# Patient Record
Sex: Male | Born: 1945 | Race: White | Hispanic: No | Smoking: Former smoker
Health system: Southern US, Community
[De-identification: ages and names within clinical notes are randomized; demographics above are authoritative.]

## PROBLEM LIST (undated history)

## (undated) DIAGNOSIS — C73 Malignant neoplasm of thyroid gland: Secondary | ICD-10-CM

## (undated) DIAGNOSIS — C801 Malignant (primary) neoplasm, unspecified: Secondary | ICD-10-CM

## (undated) DIAGNOSIS — M109 Gout, unspecified: Secondary | ICD-10-CM

## (undated) DIAGNOSIS — I10 Essential (primary) hypertension: Secondary | ICD-10-CM

## (undated) DIAGNOSIS — E785 Hyperlipidemia, unspecified: Secondary | ICD-10-CM

## (undated) DIAGNOSIS — I219 Acute myocardial infarction, unspecified: Secondary | ICD-10-CM

## (undated) HISTORY — DX: Malignant (primary) neoplasm, unspecified: C80.1

## (undated) HISTORY — DX: Acute myocardial infarction, unspecified: I21.9

## (undated) HISTORY — PX: LUNG LOBECTOMY: SHX167

## (undated) HISTORY — PX: TOTAL THYROIDECTOMY: SHX2547

## (undated) HISTORY — DX: Gout, unspecified: M10.9

## (undated) HISTORY — DX: Essential (primary) hypertension: I10

## (undated) HISTORY — DX: Malignant neoplasm of thyroid gland: C73

## (undated) HISTORY — DX: Hyperlipidemia, unspecified: E78.5

---

## 2005-06-25 HISTORY — PX: CORONARY ARTERY BYPASS GRAFT: SHX141

## 2005-11-27 ENCOUNTER — Other Ambulatory Visit: Payer: Self-pay

## 2005-11-27 ENCOUNTER — Inpatient Hospital Stay: Payer: Self-pay | Admitting: Internal Medicine

## 2005-11-27 IMAGING — CT CT CHEST W/ CM
2 of 3 series · 14 of 30 positions shown, 18 images · non-contrast
Comparison: none

REASON FOR EXAM: Chest pain        rm 17
COMMENTS:

[Series 10: soft tissue · axial · 0.76mm/px · 1 of 95 slices shown]
[im 8/95  mediastinal]
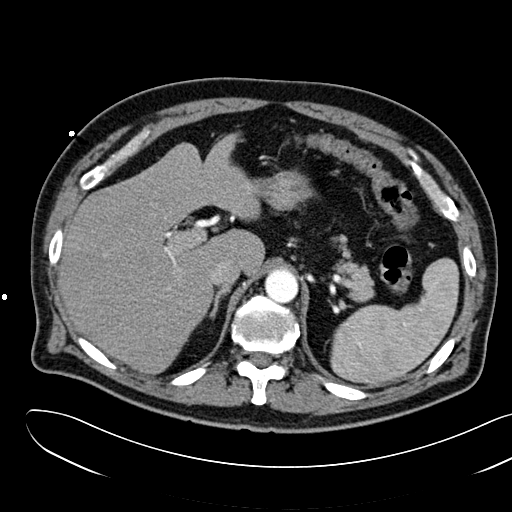

[Series 11: lung windows · axial · 0.76mm/px · z∈[+128,+359]mm · 13 of 93 slices shown, 17 images]
[im 8/93  mediastinal]
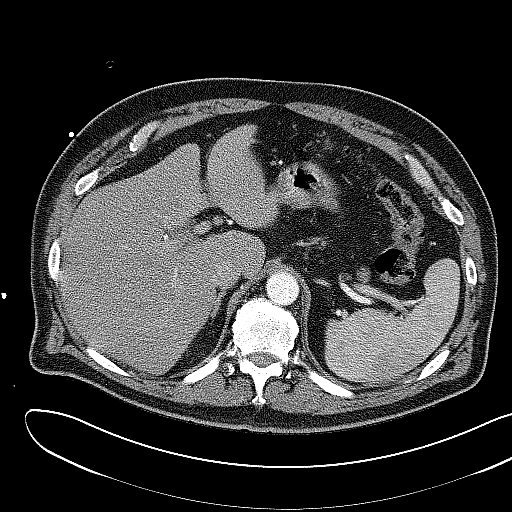
[im 8/93  lung]
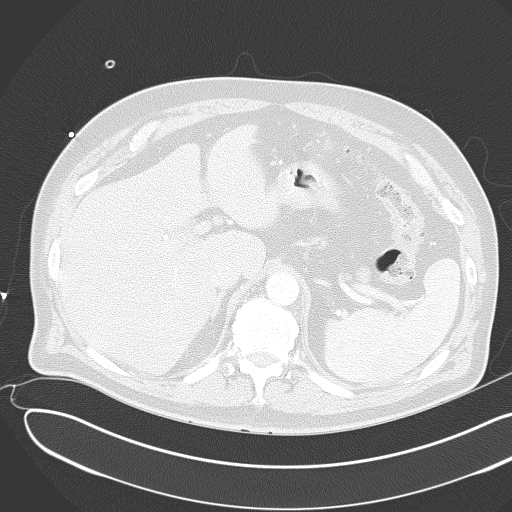
[im 15/93  lung]
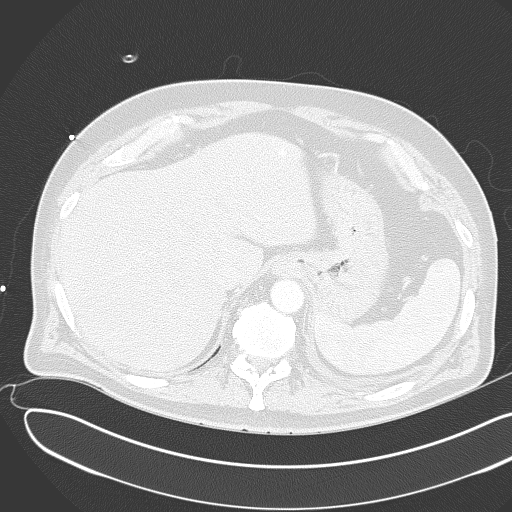
[im 22/93  lung]
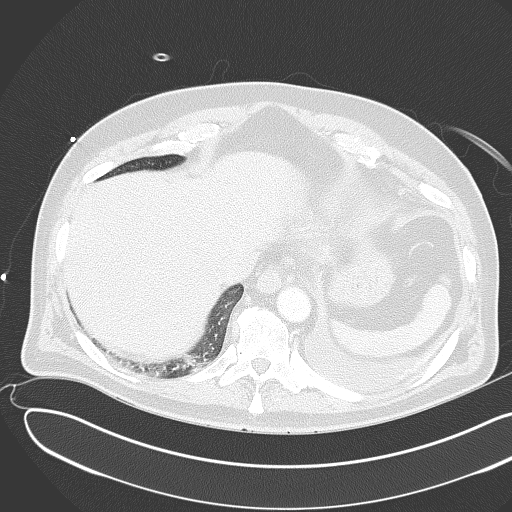
[im 29/93  lung]
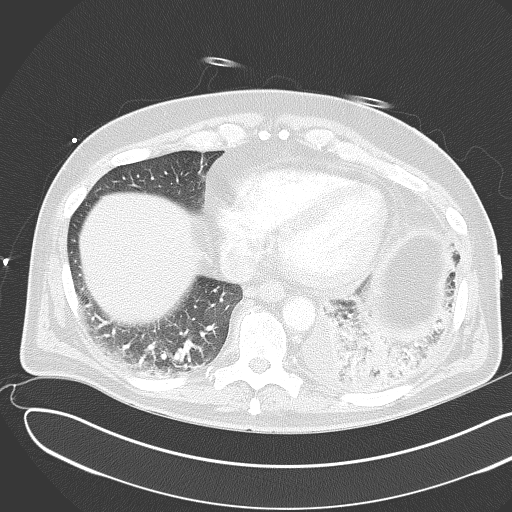
[im 36/93  mediastinal]
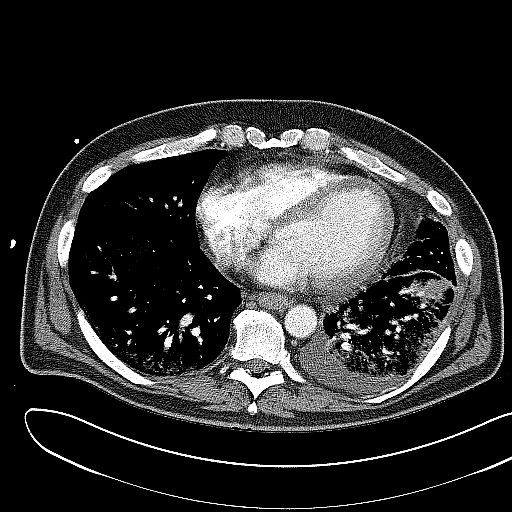
[im 36/93  lung]
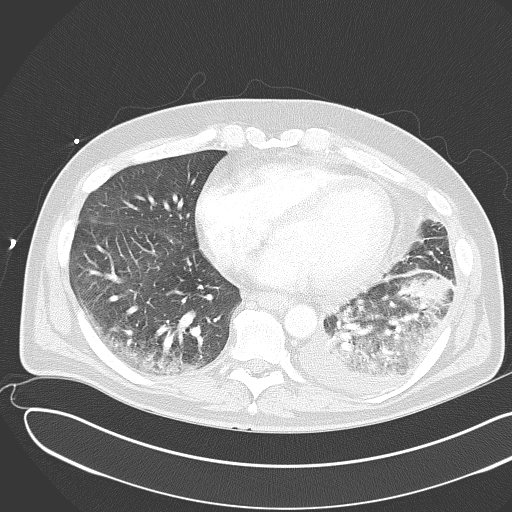
[im 43/93  lung]
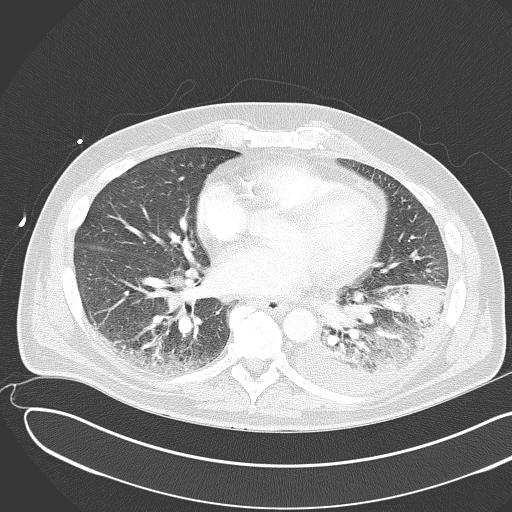
[im 47/93  lung]
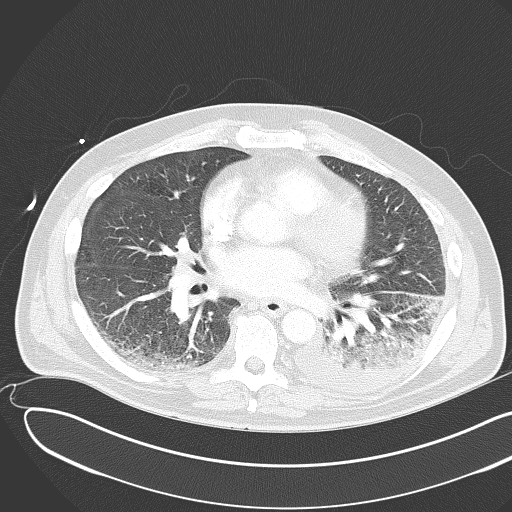
[im 50/93  lung]
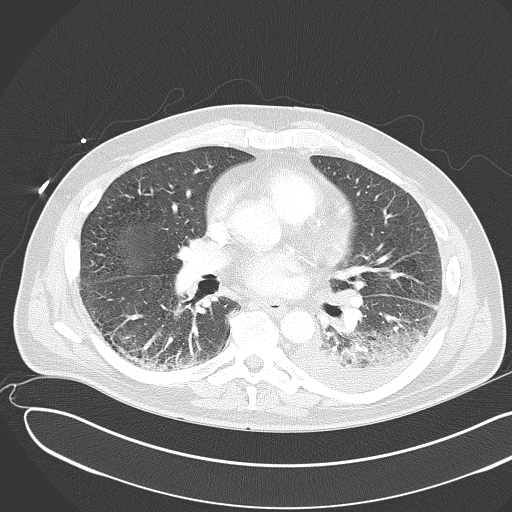
[im 57/93  mediastinal]
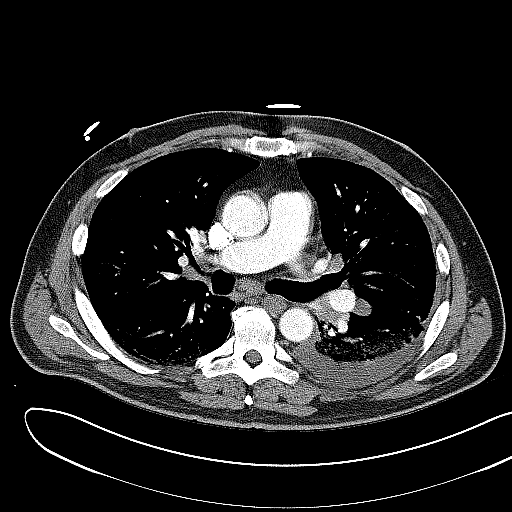
[im 57/93  lung]
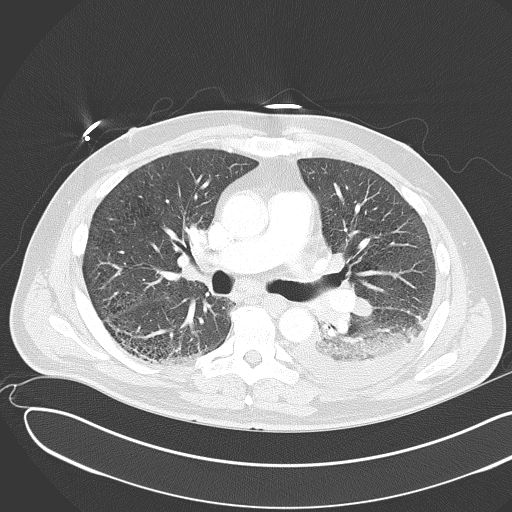
[im 64/93  lung]
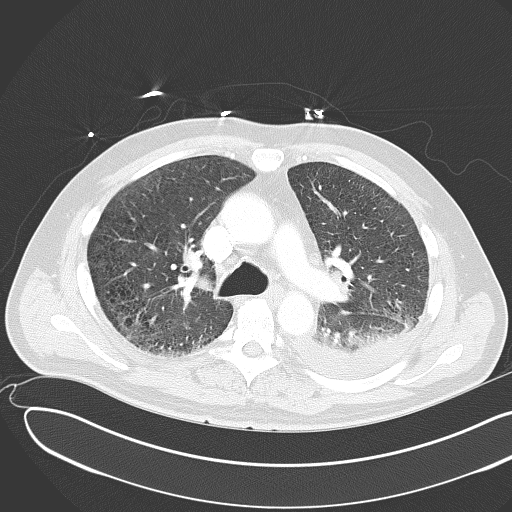
[im 71/93  lung]
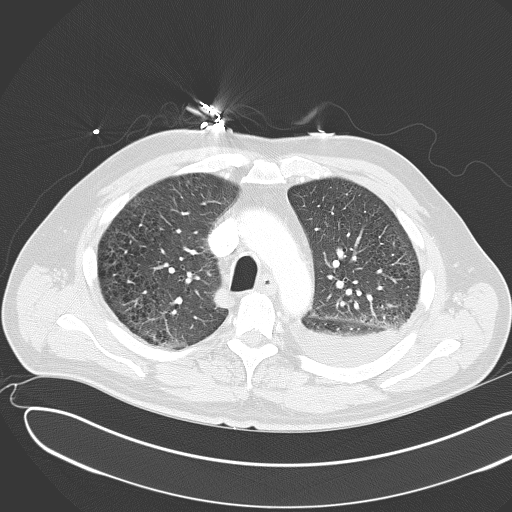
[im 78/93  lung]
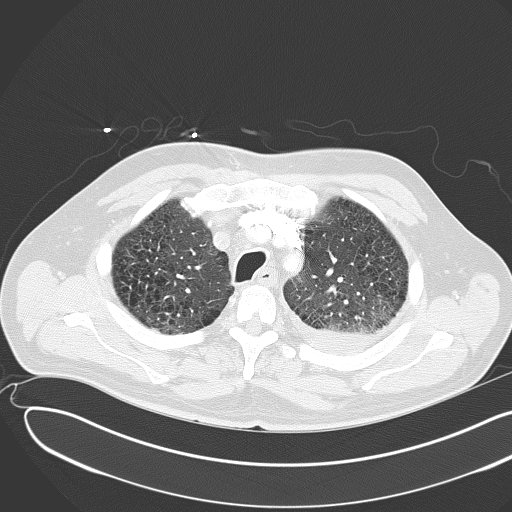
[im 85/93  mediastinal]
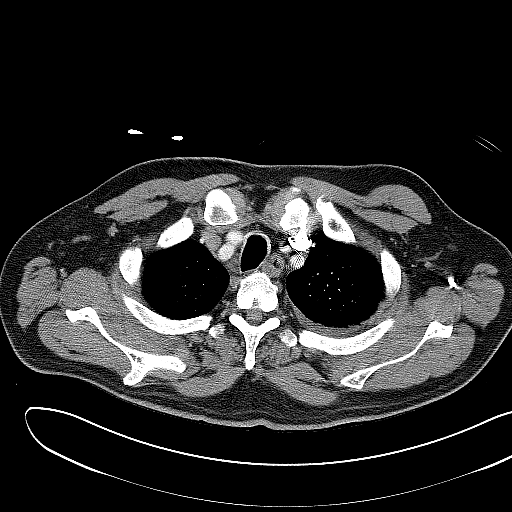
[im 85/93  lung]
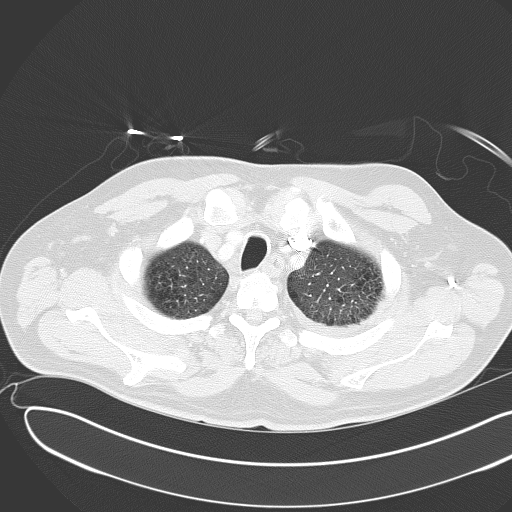

[14 of 30 positions shown; findings below may reference images not displayed]

PROCEDURE:     CT  - CT CHEST (FOR PE) W  - [DATE]  [DATE]

RESULT:     Spiral 3-mm sections were obtained from the thoracic inlet to
the lung bases status post intravenous administration of 100 milliliters of
[X1].  Evaluation of the mediastinum and hilar regions and structures
demonstrates bilateral hilar adenopathy as well as subcentimeter lymph nodes
in the peritracheal region and prevascular space.  Multichambered cardiac
enlargement is appreciated.  There does not appear to be filling defects
within the main lobar or segmental pulmonary arteries to suggest sequela of
pulmonary embolus.  Within the lateral basal segment of the RIGHT lower lobe
an area of soft tissue attenuation projects within the periphery of the lung
measuring approximately 3.92 x 3.13 cm.  Differential considerations are
atelectasis versus infiltrate versus more ominous etiologies such as
neoplastic disease.  Clinical correlation is recommended.  If the patient
does not demonstrates signs and symptoms of infection, neoplastic disease
would be a higher differential consideration and further evaluation with PET
imaging and oncological consultation is recommended. A small LEFT pleural
effusion is also appreciated as well as atelectasis and/or consolidation
within the LEFT lung base.  To a much lesser extent these findings are
appreciated within the RIGHT lung base.  The visualized upper abdominal
viscera demonstrate no gross abnormalities.
IMPRESSION: No evidence of pulmonary embolus.

Consolidative soft tissue density projecting within the LEFT lung base.
Differential considerations are atelectasis versus infiltrate versus more
ominous etiology such as neoplastic disease as described above. Clinical
correlation is recommended.

There appears to be bilateral hilar adenopathy.

Small effusion.

Atelectasis versus infiltrate within the lung bases LEFT greater than RIGHT.

Dr. MINODA of the emergency department was informed of these findings at the
time of the initial interpretation.

## 2005-11-27 IMAGING — CR DG CHEST 1V PORT
1 series · 1 of 1 positions shown · non-contrast
Comparison: none

REASON FOR EXAM: chest pain       rm 17
COMMENTS:  LMP: (Male)

PROCEDURE:     DXR - DXR PORTABLE CHEST SINGLE VIEW  - [DATE]  [DATE]
RESULT:     There is an infiltrate at the LEFT lung base.  Elsewhere, the
lungs are clear though the interstitial markings are minimally prominent.
The heart is not enlarged.

[view not recorded]
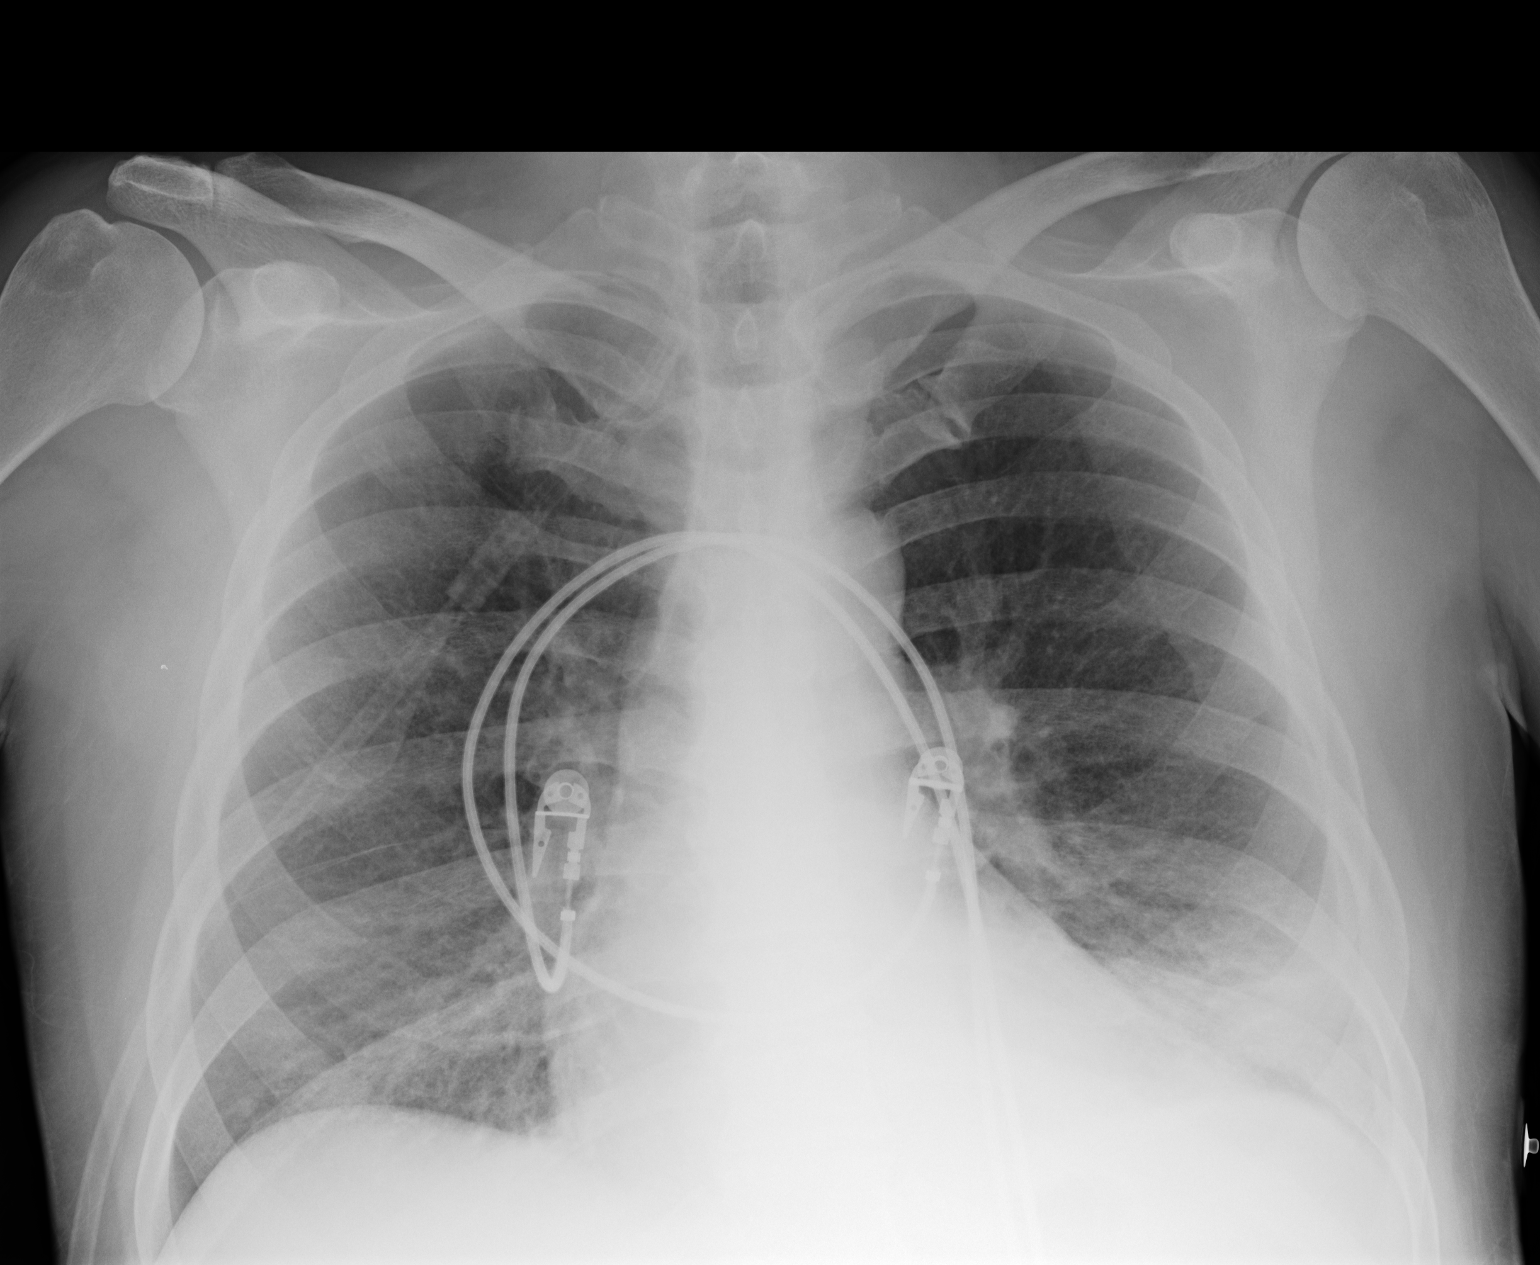

[1 of 1 positions shown; findings below may reference images not displayed]

IMPRESSION: 1)There are findings consistent with LEFT lower lobe pneumonia.  A follow-up
PA and lateral chest x-ray is recommended following therapy.

## 2005-11-28 ENCOUNTER — Other Ambulatory Visit: Payer: Self-pay

## 2005-11-29 IMAGING — CR DG CHEST 1V PORT
1 series · 1 of 1 positions shown · non-contrast
Comparison: none

REASON FOR EXAM: stat, increased shortness of breath, chest pain
COMMENTS:

[view not recorded]
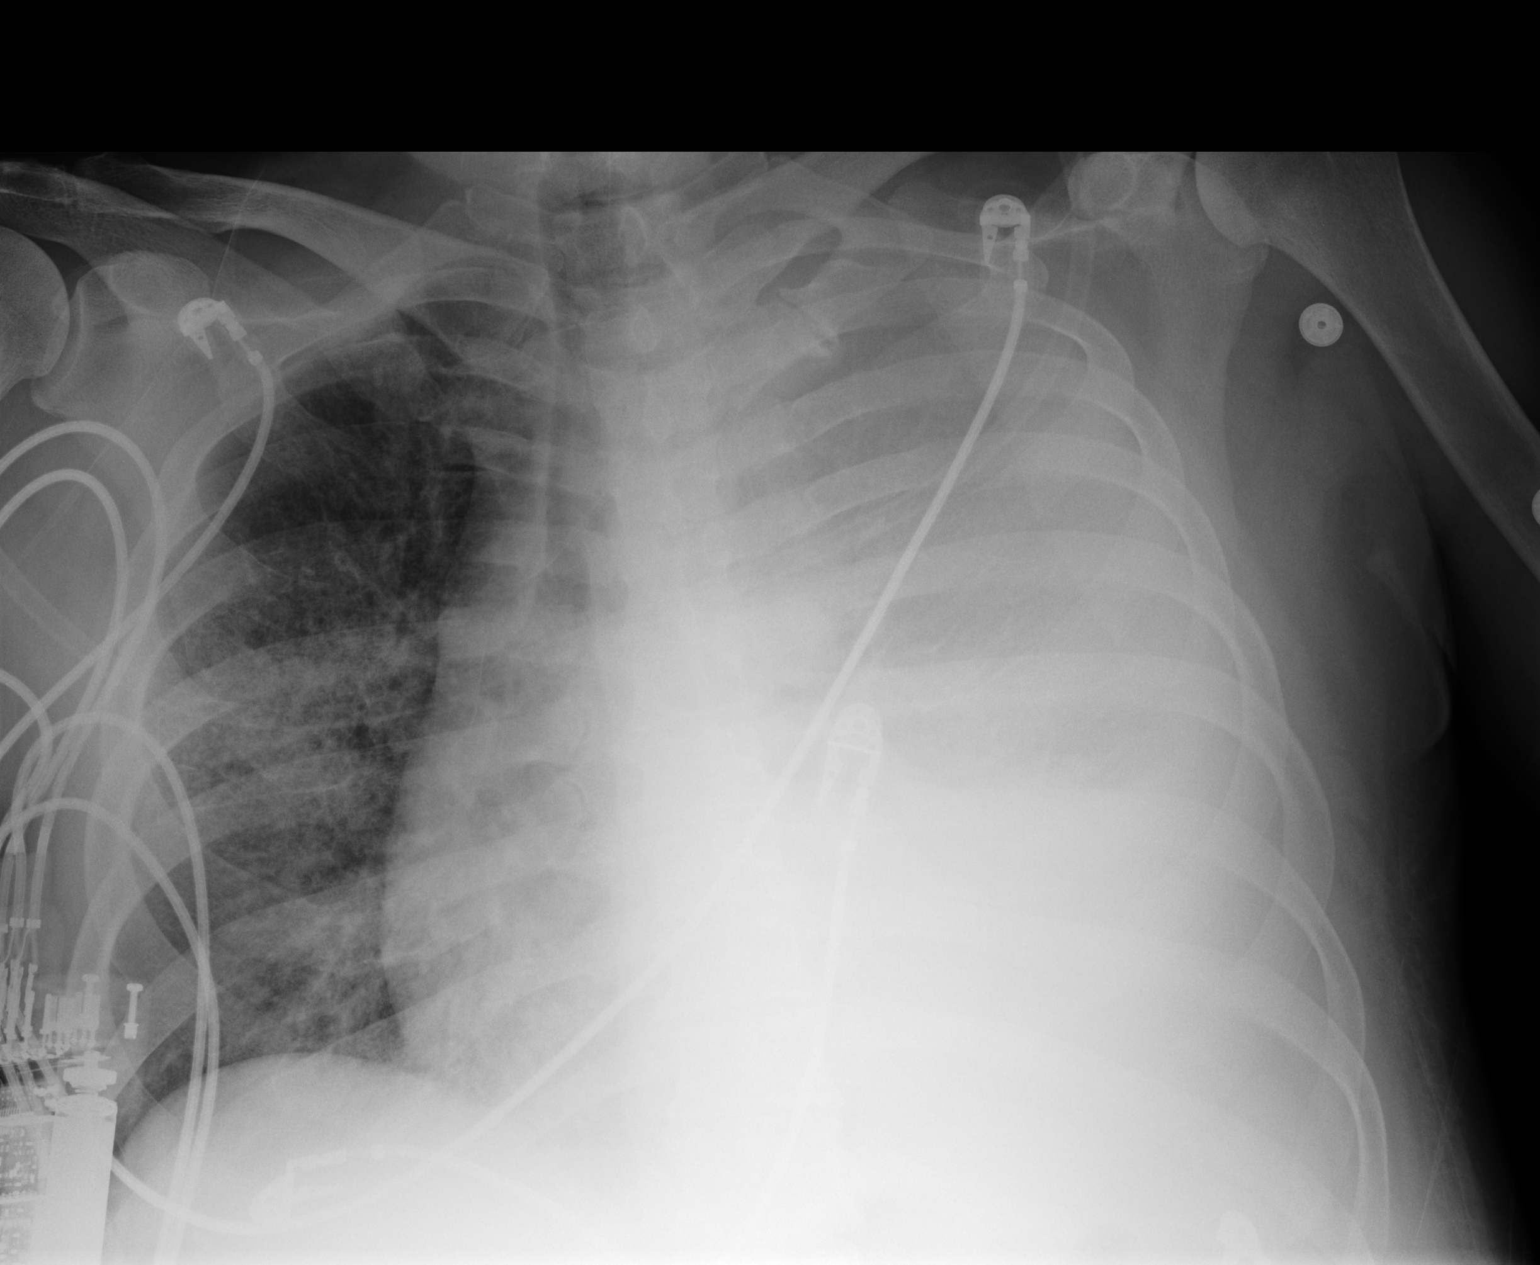

[1 of 1 positions shown; findings below may reference images not displayed]

PROCEDURE:     DXR - DXR PORTABLE CHEST SINGLE VIEW  - [DATE]  [DATE]

RESULT:          Comparison is made to a prior study of [DATE].

When compared to a previous study, there has been near complete
opacification of the LEFT hemithorax.  There is thickening of the
interstitial markings within the aerated RIGHT hemithorax with peribronchial
cuffing.  The cardiac silhouette is partially silhouetted by the LEFT
hemithorax.  The visualized bony skeleton demonstrates no evidence of
fracture or dislocation.
IMPRESSION: 1.     Findings consistent with pulmonary edema.
2.     Superimposed pulmonary edema with possibly a pleural effusion and/or
diffuse infiltrate, nonedematous, involving the LEFT hemithorax as described
above.

## 2005-11-29 IMAGING — CR DG RIBS 2V*L*
1 series · 2 of 2 positions shown · non-contrast
Comparison: none

REASON FOR EXAM: Left anterior rib pain, rule out fractere or lytic lesion
COMMENTS:

PROCEDURE:     DXR - DXR RIBS LEFT UNILATERAL  - [DATE]  [DATE]
RESULT:     No evidence of displaced rib fracture or pneumothorax. Diffuse
LEFT pulmonary infiltrate is present.

[Series 1: view not recorded · 0.17mm/px · 2 of 2 slices shown]
[im 1/2]
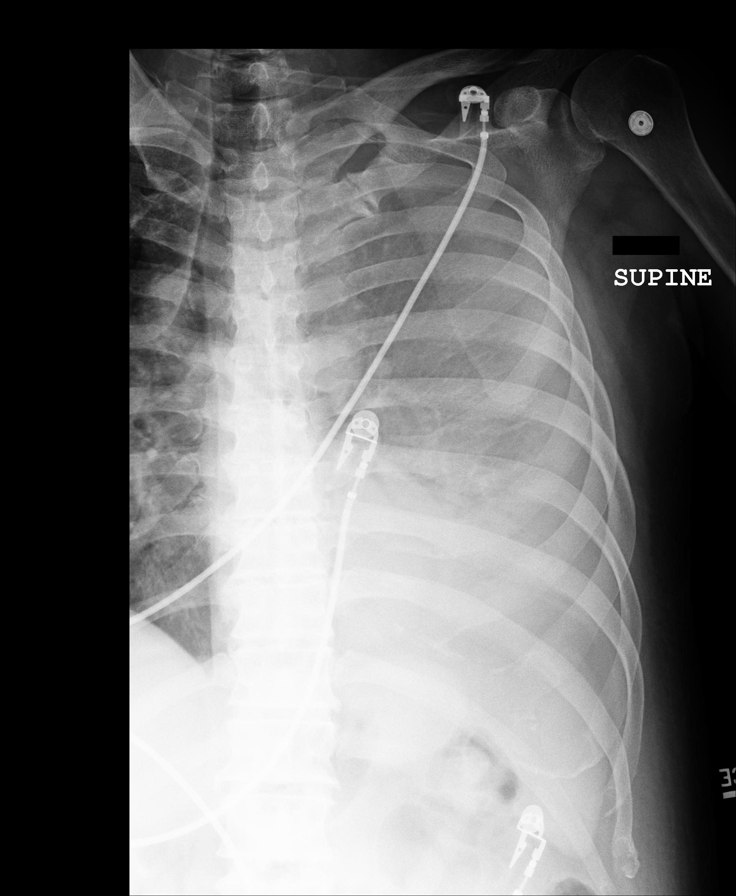
[im 2/2]
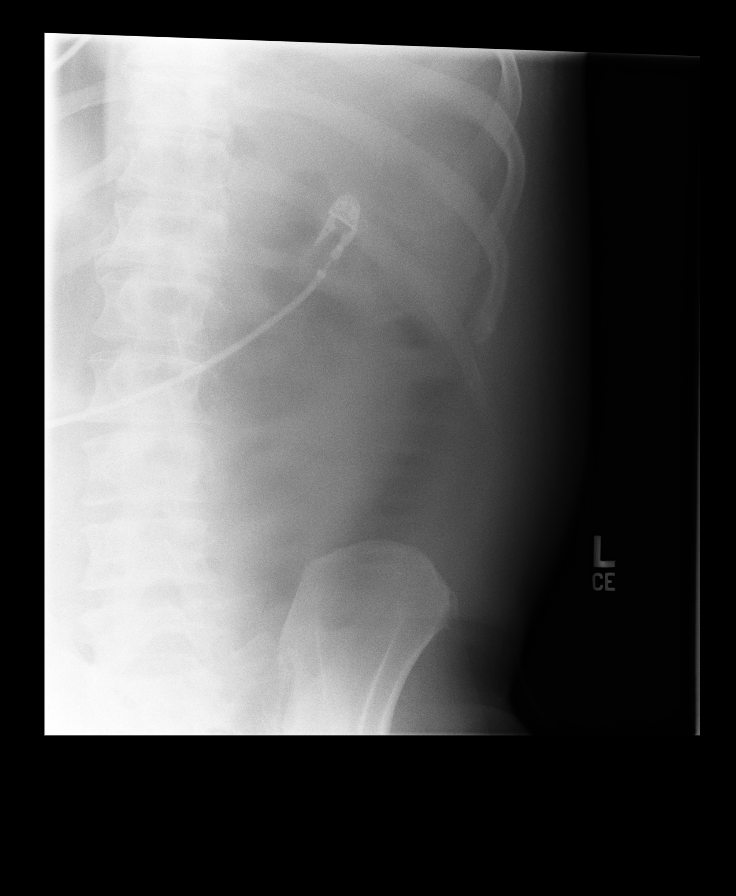

[2 of 2 positions shown; findings below may reference images not displayed]

IMPRESSION: 1.     No focal or acute abnormality is identified.
2.     Diffuse LEFT pulmonary infiltrate.

## 2005-11-30 IMAGING — CR DG CHEST 1V PORT
1 series · 1 of 1 positions shown · non-contrast
Comparison: none

REASON FOR EXAM: PNEUMONIA
COMMENTS:

[view not recorded]
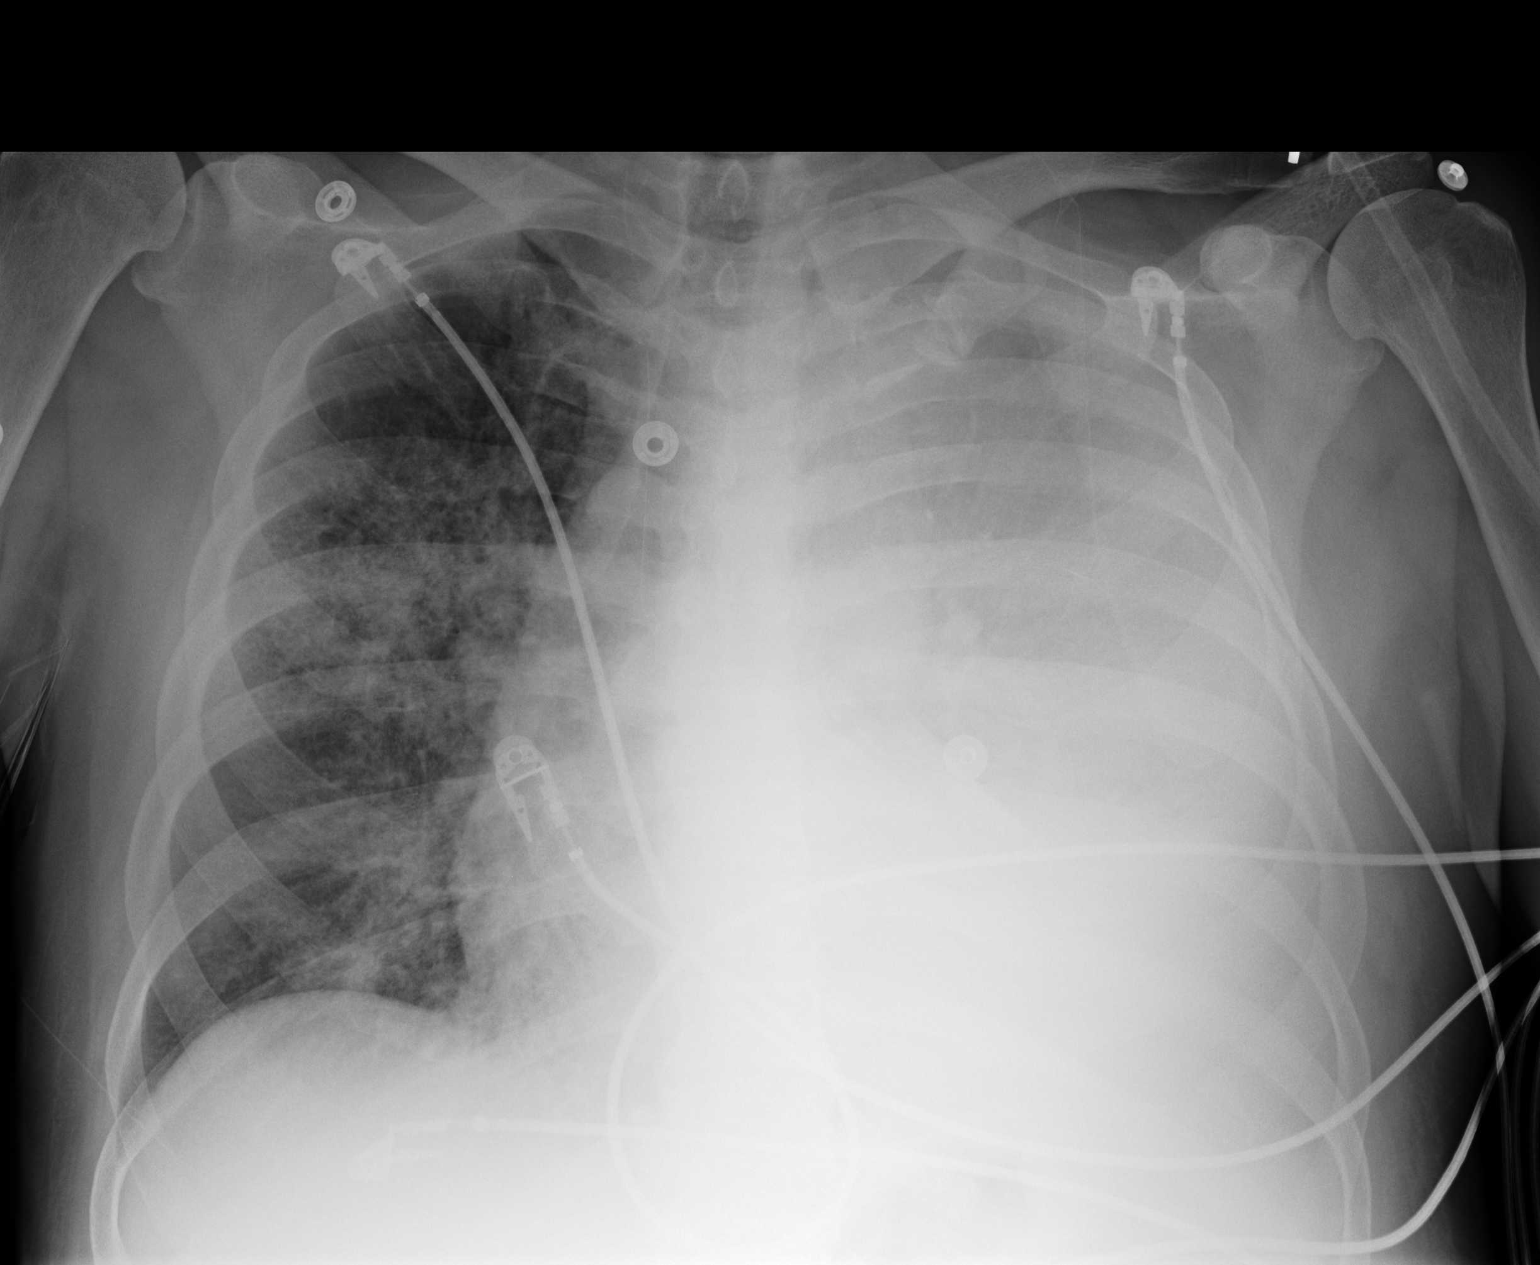

[1 of 1 positions shown; findings below may reference images not displayed]

PROCEDURE:     DXR - DXR PORTABLE CHEST SINGLE VIEW  - [DATE]  [DATE]

RESULT:     Diffuse LEFT lung pulmonary infiltrate is present.  This
consolidates the entire LEFT lung.  Partial consolidation is also present of
the RIGHT lung.  A pleural effusion on the LEFT cannot be excluded.
Endobronchial obstruction on either side cannot be excluded.  No bony
abnormalities are identified.  There does not appear to be cardiomegaly.
However, the LEFT heart border is difficult to discern due to the LEFT
pulmonary infiltrate.
IMPRESSION: 1.     Dense consolidation of the LEFT lung as described above.  An
associated LEFT pleural effusion cannot be completely excluded.
2.     Diffuse pulmonary infiltrate, RIGHT lung.  An endobronchial
obstruction cannot be excluded on either side.  CT of the chest and/or
bronchoscopy may prove useful for further evaluation.

## 2005-11-30 IMAGING — CR DG CHEST 1V PORT
1 series · 1 of 1 positions shown · non-contrast
Comparison: none

REASON FOR EXAM: Post bronch
COMMENTS:

PROCEDURE:     DXR - DXR PORTABLE CHEST SINGLE VIEW  - [DATE]  [DATE]
RESULT:        Endotracheal tube noted in good anatomic position.
Opacification of the LEFT lung is noted.  Diffuse infiltrate in the RIGHT
lung is noted.  There has been no interim improvement from chest x-ray of
the same day.

[view not recorded]
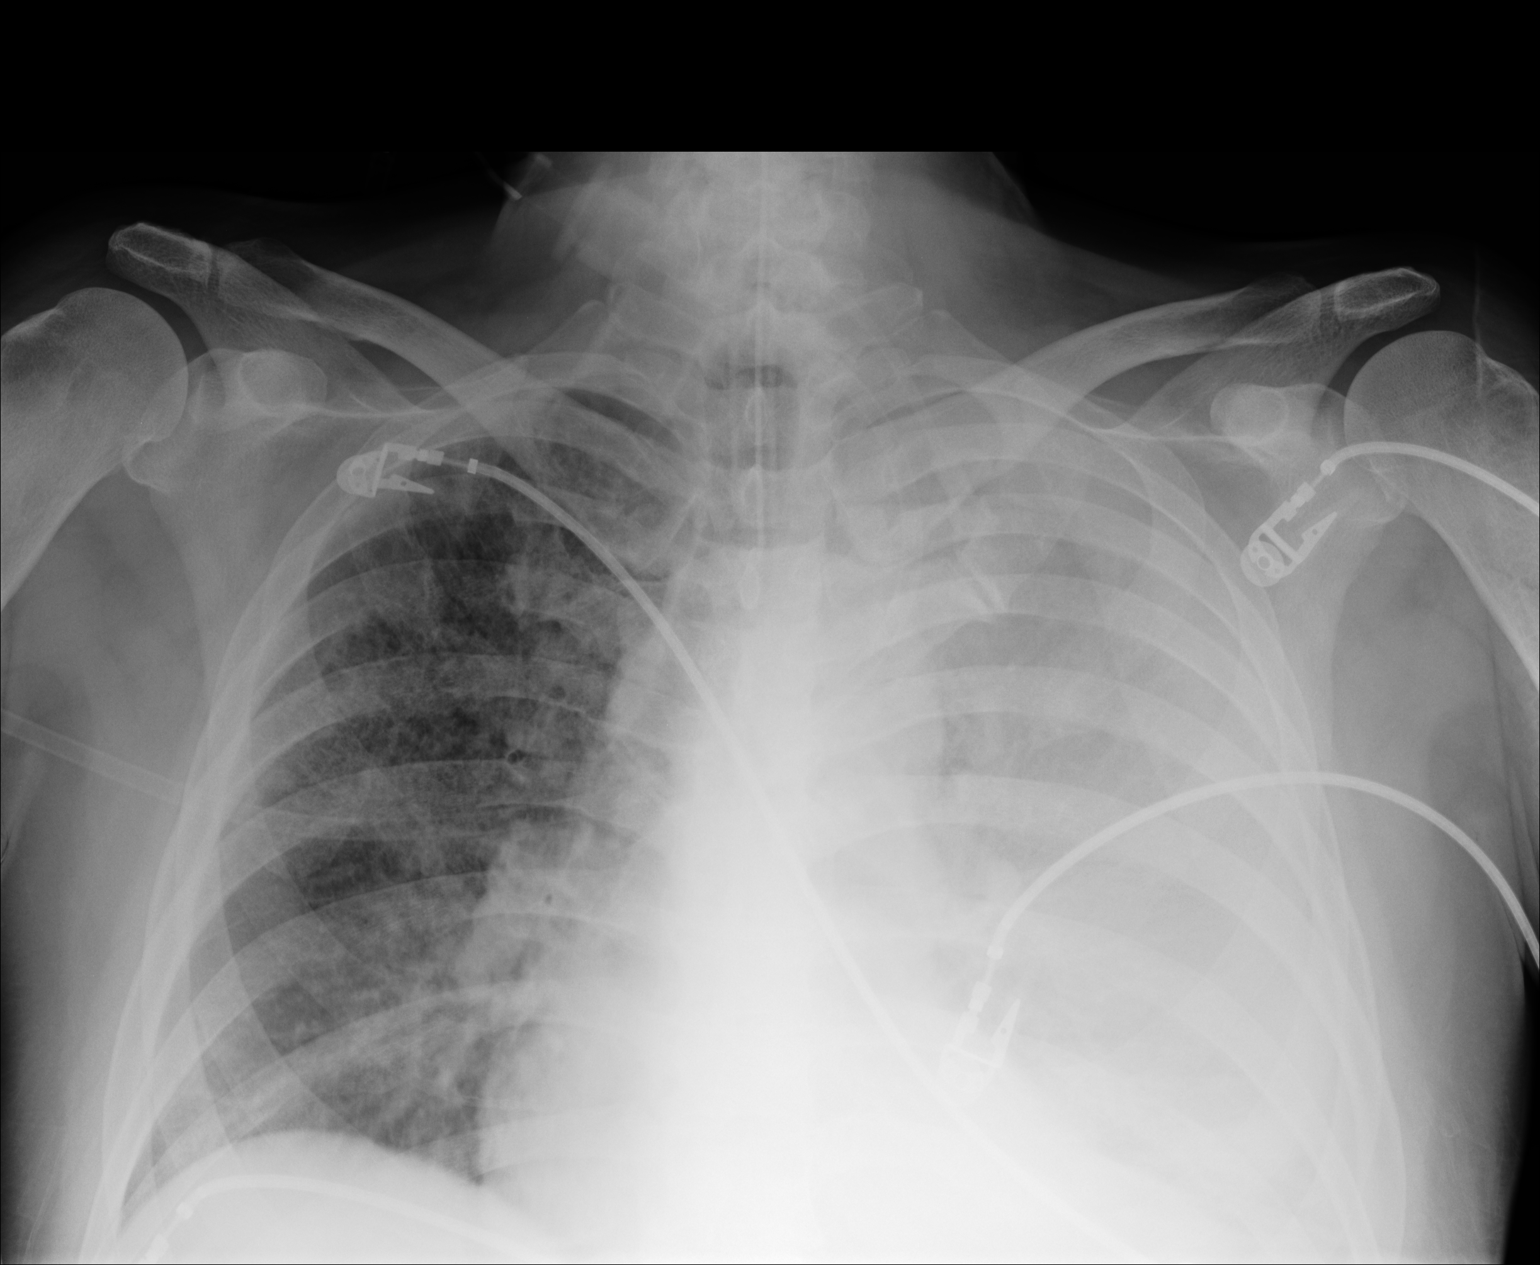

[1 of 1 positions shown; findings below may reference images not displayed]

IMPRESSION: 1.     Dense opacification of the LEFT lung.  Endotracheal tube noted in
good anatomic position.
2.     Diffuse infiltrate in the RIGHT lung.
3.     A LEFT pleural effusion cannot be excluded.  The heart size appears
to be normal.

## 2005-12-01 IMAGING — CT CT CHEST W/ CM
1 series · 15 of 33 positions shown, 19 images · non-contrast
Comparison: none

REASON FOR EXAM: pneumonia, rule out empyema
COMMENTS:

[Series 2: soft tissue · axial · 0.72mm/px · z∈[+1074,+1364]mm · 15 of 68 slices shown, 19 images]
[im 5/68  mediastinal]
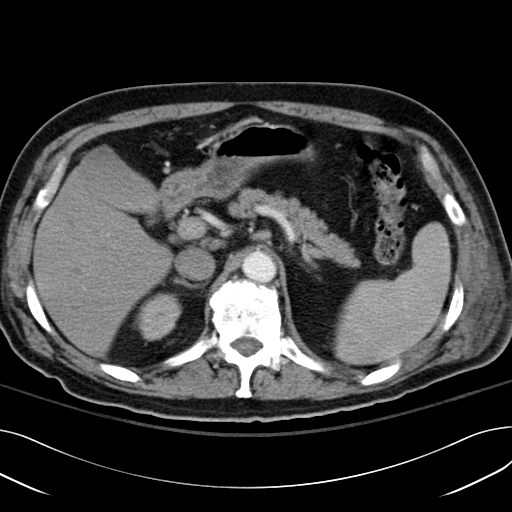
[im 5/68  lung]
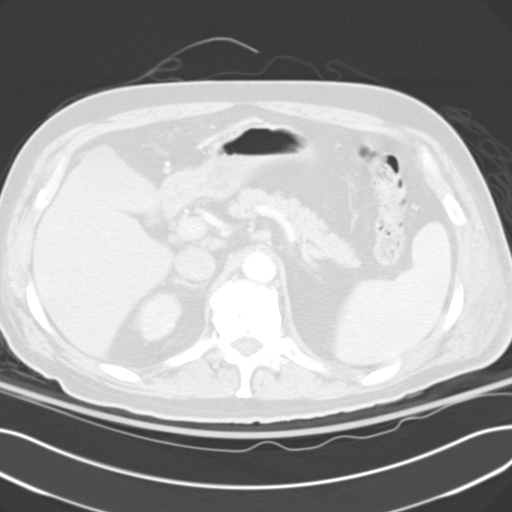
[im 10/68  lung]
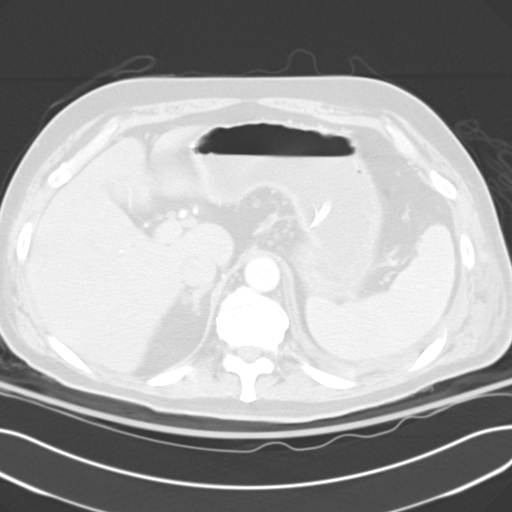
[im 14/68  lung]
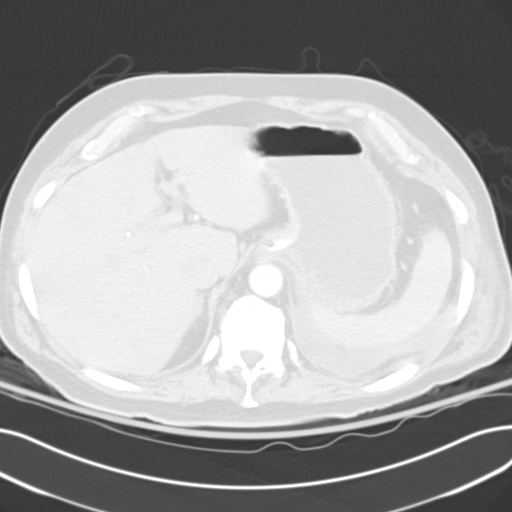
[im 18/68  lung]
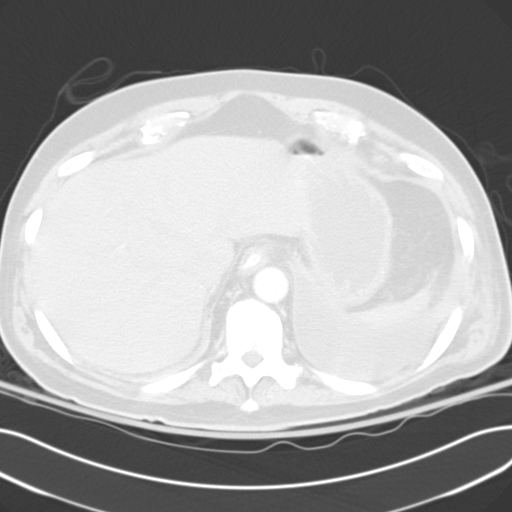
[im 23/68  mediastinal]
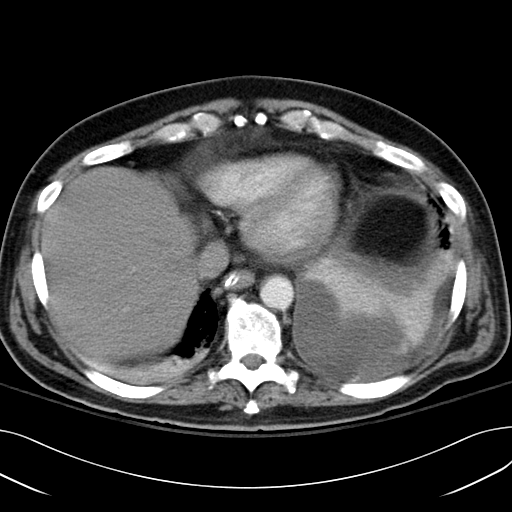
[im 23/68  lung]
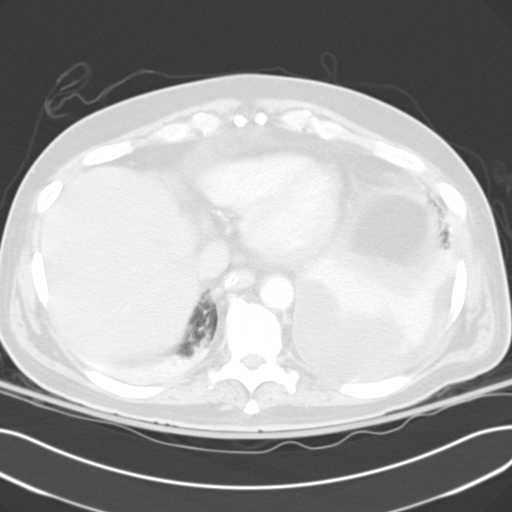
[im 27/68  lung]
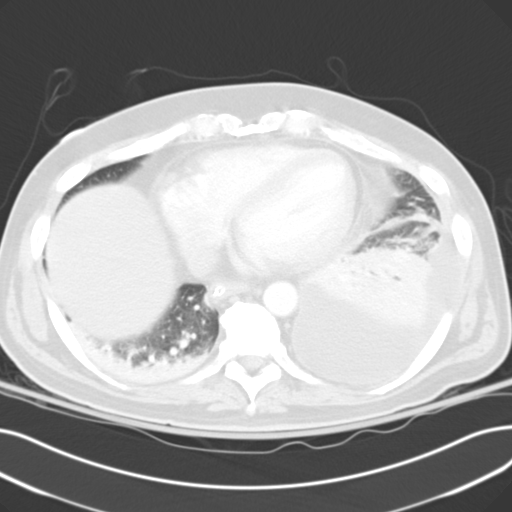
[im 30/68  lung]
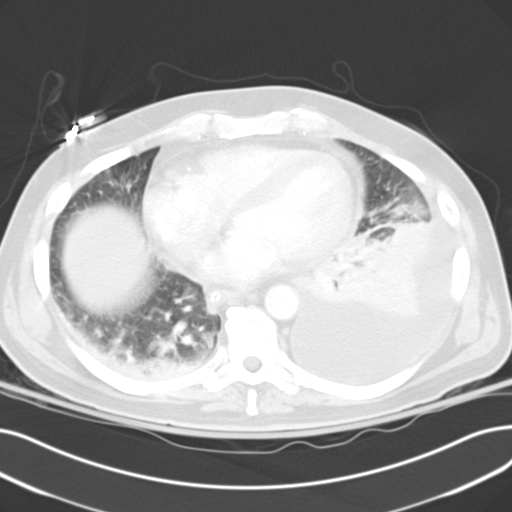
[im 35/68  lung]
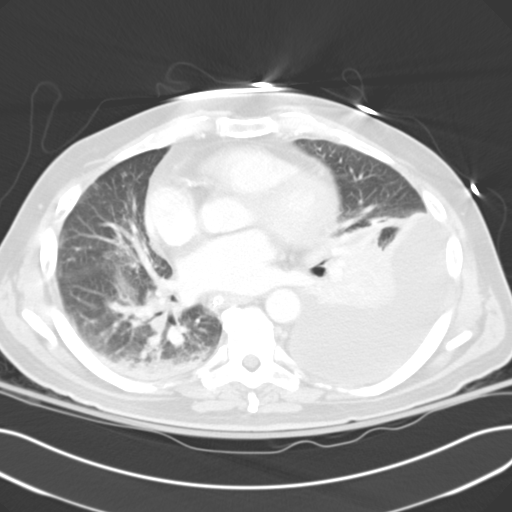
[im 38/68  mediastinal]
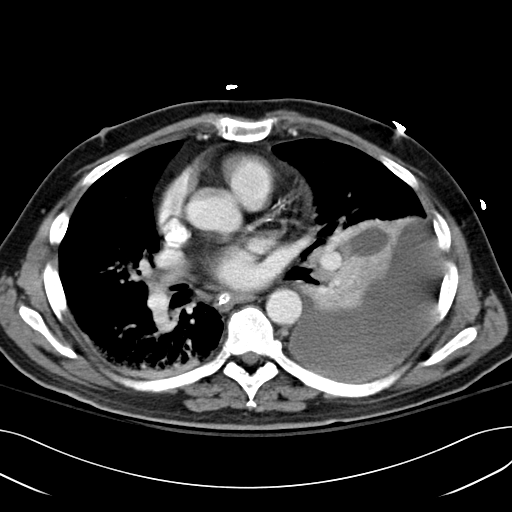
[im 38/68  lung]
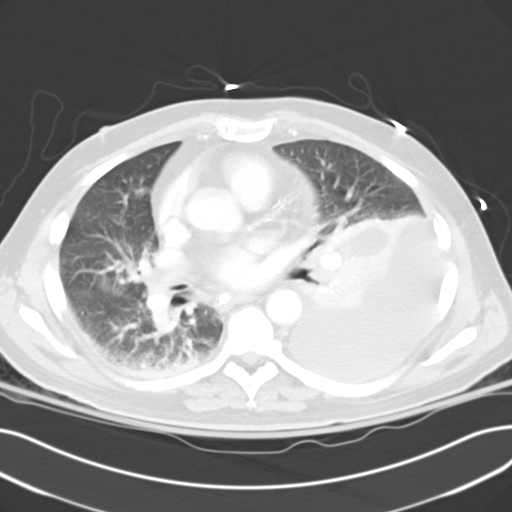
[im 41/68  lung]
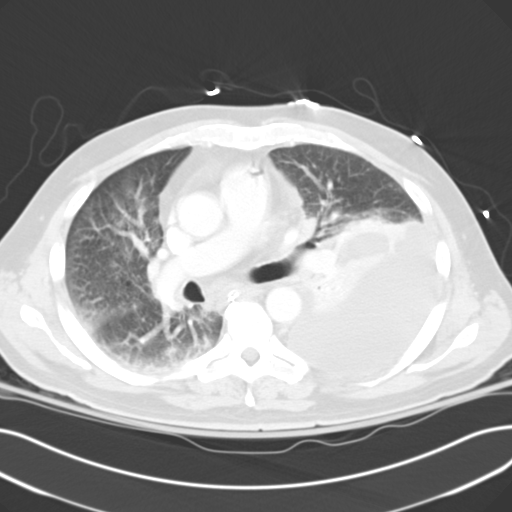
[im 45/68  lung]
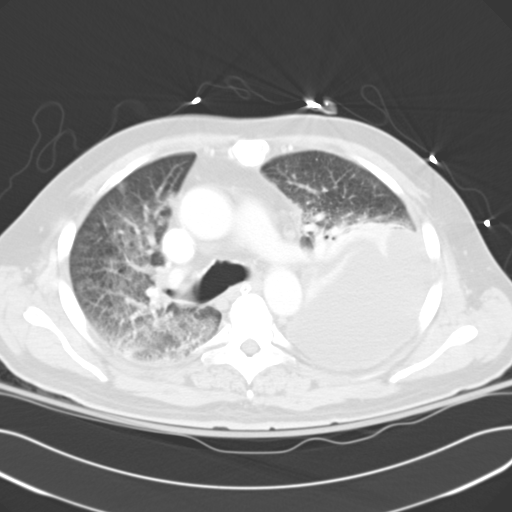
[im 50/68  lung]
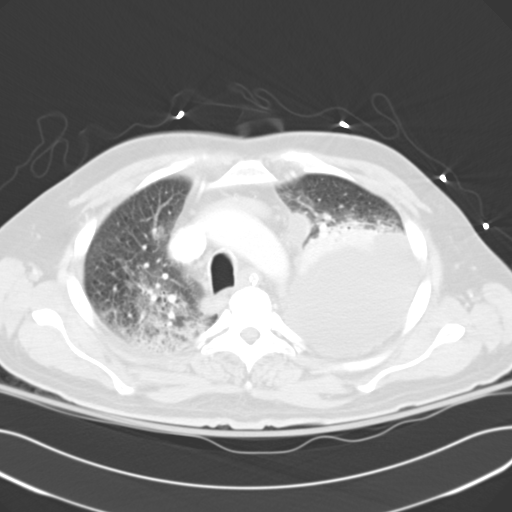
[im 54/68  mediastinal]
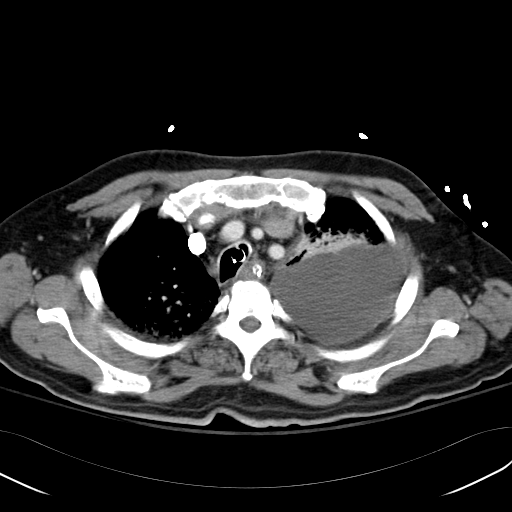
[im 54/68  lung]
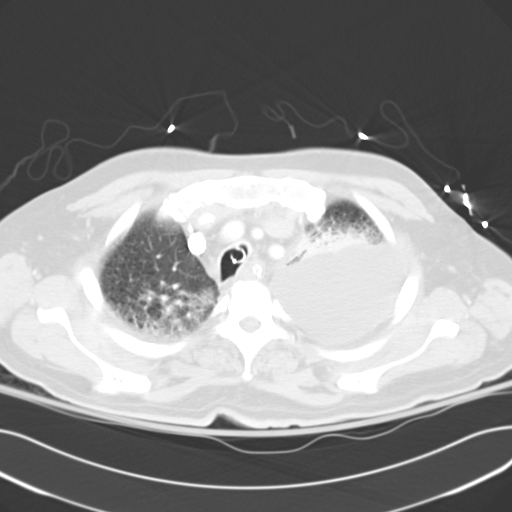
[im 58/68  lung]
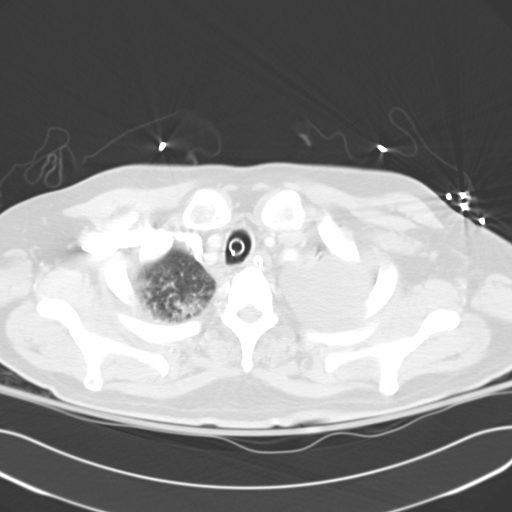
[im 63/68  lung]
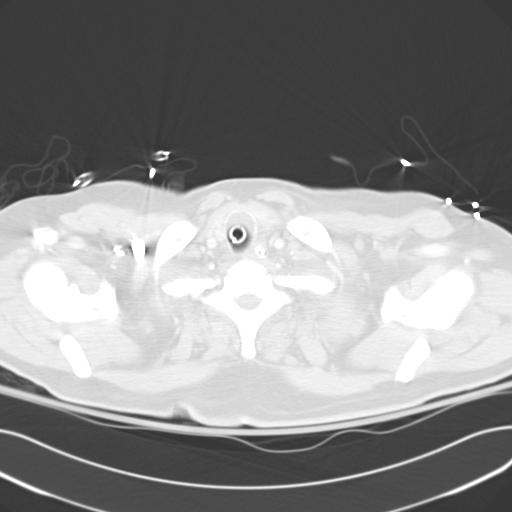

[15 of 33 positions shown; findings below may reference images not displayed]

PROCEDURE:     CT  - CT CHEST WITH CONTRAST  - [DATE]  [DATE]

RESULT:          Spiral 5 mm sections were obtained from the thoracic inlet
through the lung bases status post intravenous administration of 75 ml of
[UG].

Evaluation of the mediastinum and hilar regions and structures demonstrates
multichamber cardiac enlargement.  There does not appear to be gross
evidence of mediastinal masses.  Note, the LEFT hilar region is partially
obscured posterolaterally by a large LEFT pleural effusion and atelectasis
and/or consolidation within the LEFT hemithorax.  Subcentimeter lymph nodes
are demonstrated within the RIGHT paratracheal region and within the RIGHT
hilar region.  A patchy area of air space disease is appreciated involving
the aerated RIGHT hemithorax with a more consolidative nature in the lung
base.  The visualized upper abdominal viscera demonstrate no gross
abnormalities.

The pleural effusion within the LEFT hemithorax does not demonstrate a split
pleural sign which would suggest the sequela of an empyema.
IMPRESSION: 1.     Large LEFT pleural effusion.
2.     Atelectasis and/or consolidation within the LEFT lower lobe.
3.     Air space disease within the RIGHT hemithorax.  There is multichamber
cardiac enlargement, and an element of this air space disease may represent
pulmonary edema.
4.     Atelectasis versus infiltrate RIGHT lung base.

## 2005-12-01 IMAGING — CR DG CHEST 1V PORT
1 series · 1 of 1 positions shown · non-contrast
Comparison: none

REASON FOR EXAM: pneumonia
COMMENTS:

[view not recorded]
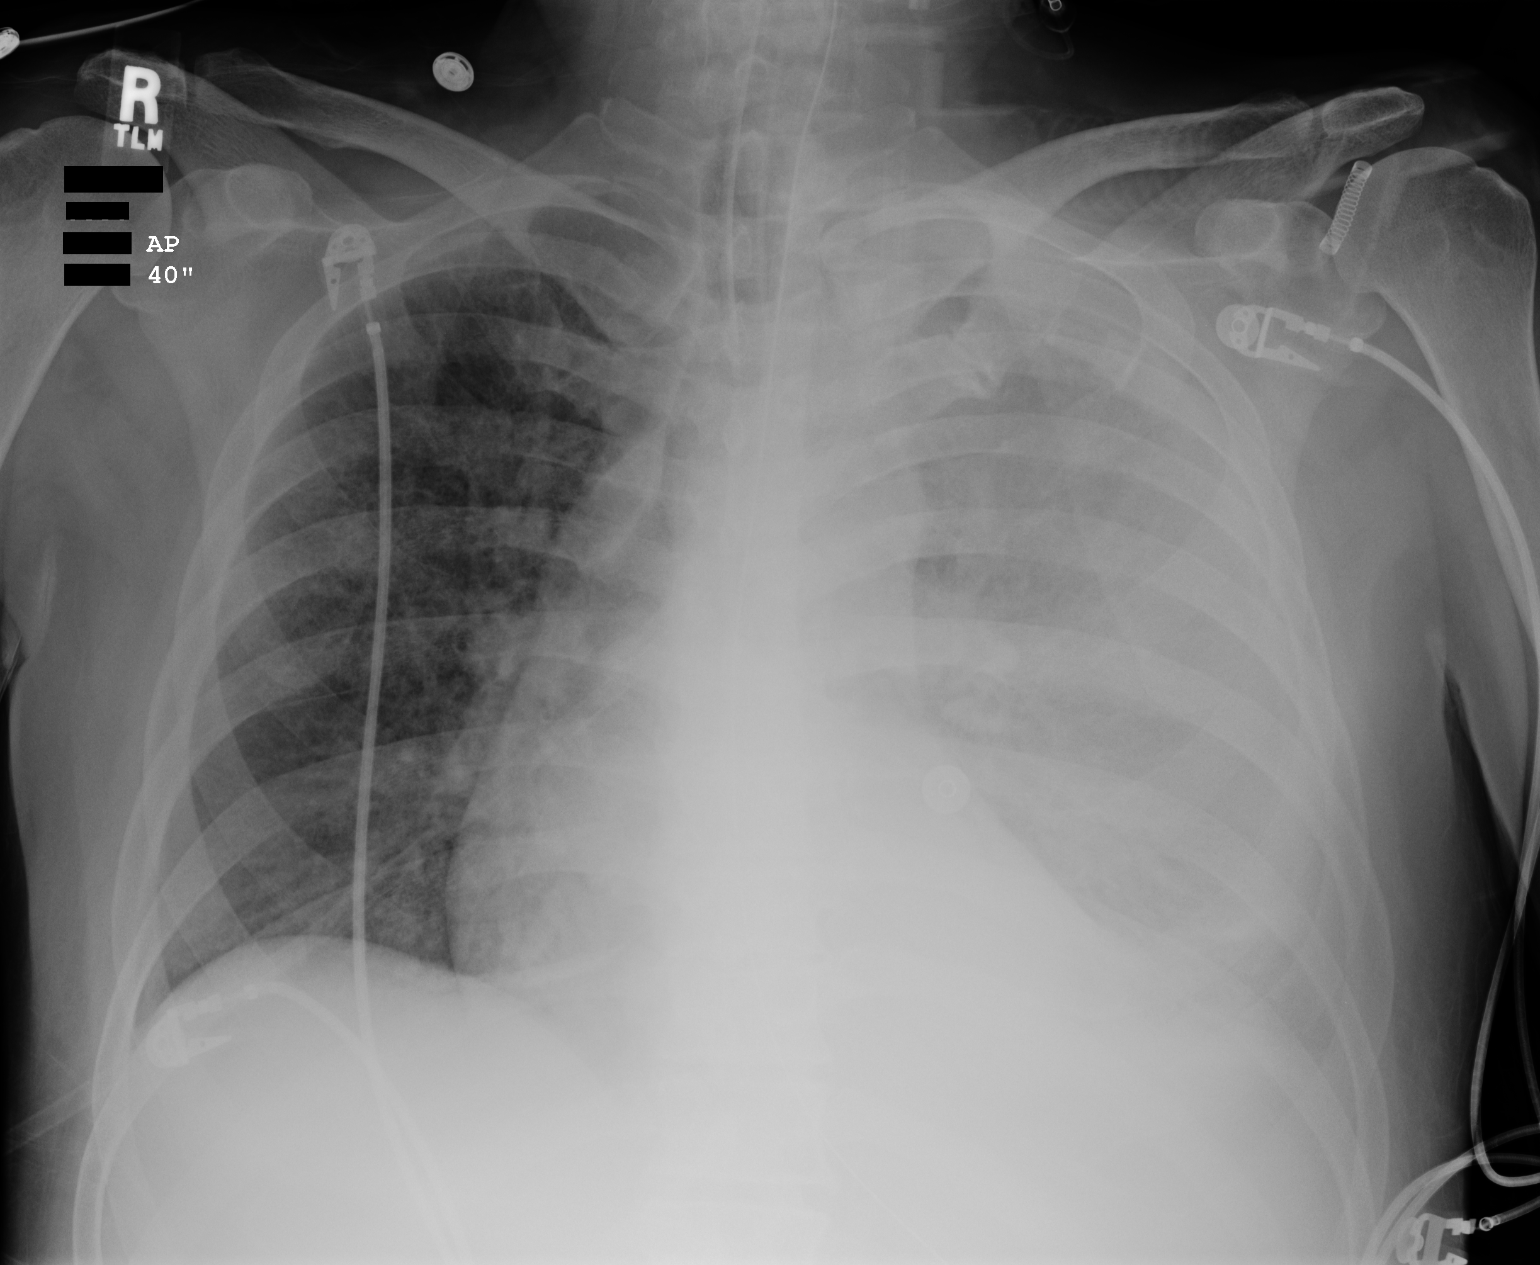

[1 of 1 positions shown; findings below may reference images not displayed]

PROCEDURE:     DXR - DXR PORTABLE CHEST SINGLE VIEW  - [DATE]  [DATE]

RESULT:          This study is compared to the previous study dated
[DATE].

An endotracheal tube is appreciated with the tip just below the level of the
clavicles.  A nasogastric tube is seen with the tip not on the view of the
study.  Once again opacification within the LEFT hemithorax is identified,
which is unchanged.  There has been decreased conspicuity of the
interstitial markings and peribronchial cuffing in the RIGHT hemithorax.  No
new focal regions of consolidation are appreciated.  The cardiac silhouette
is enlarged.  The visualized bony skeleton is unremarkable.
IMPRESSION: 1.     Findings which appear to be consistent with improved pulmonary edema.
2.     Persistent opacification of the LEFT hemithorax.

## 2005-12-03 IMAGING — CR DG CHEST 1V PORT
1 series · 1 of 1 positions shown · non-contrast
Comparison: none

REASON FOR EXAM: on vent with pneumonia
COMMENTS:

[view not recorded]
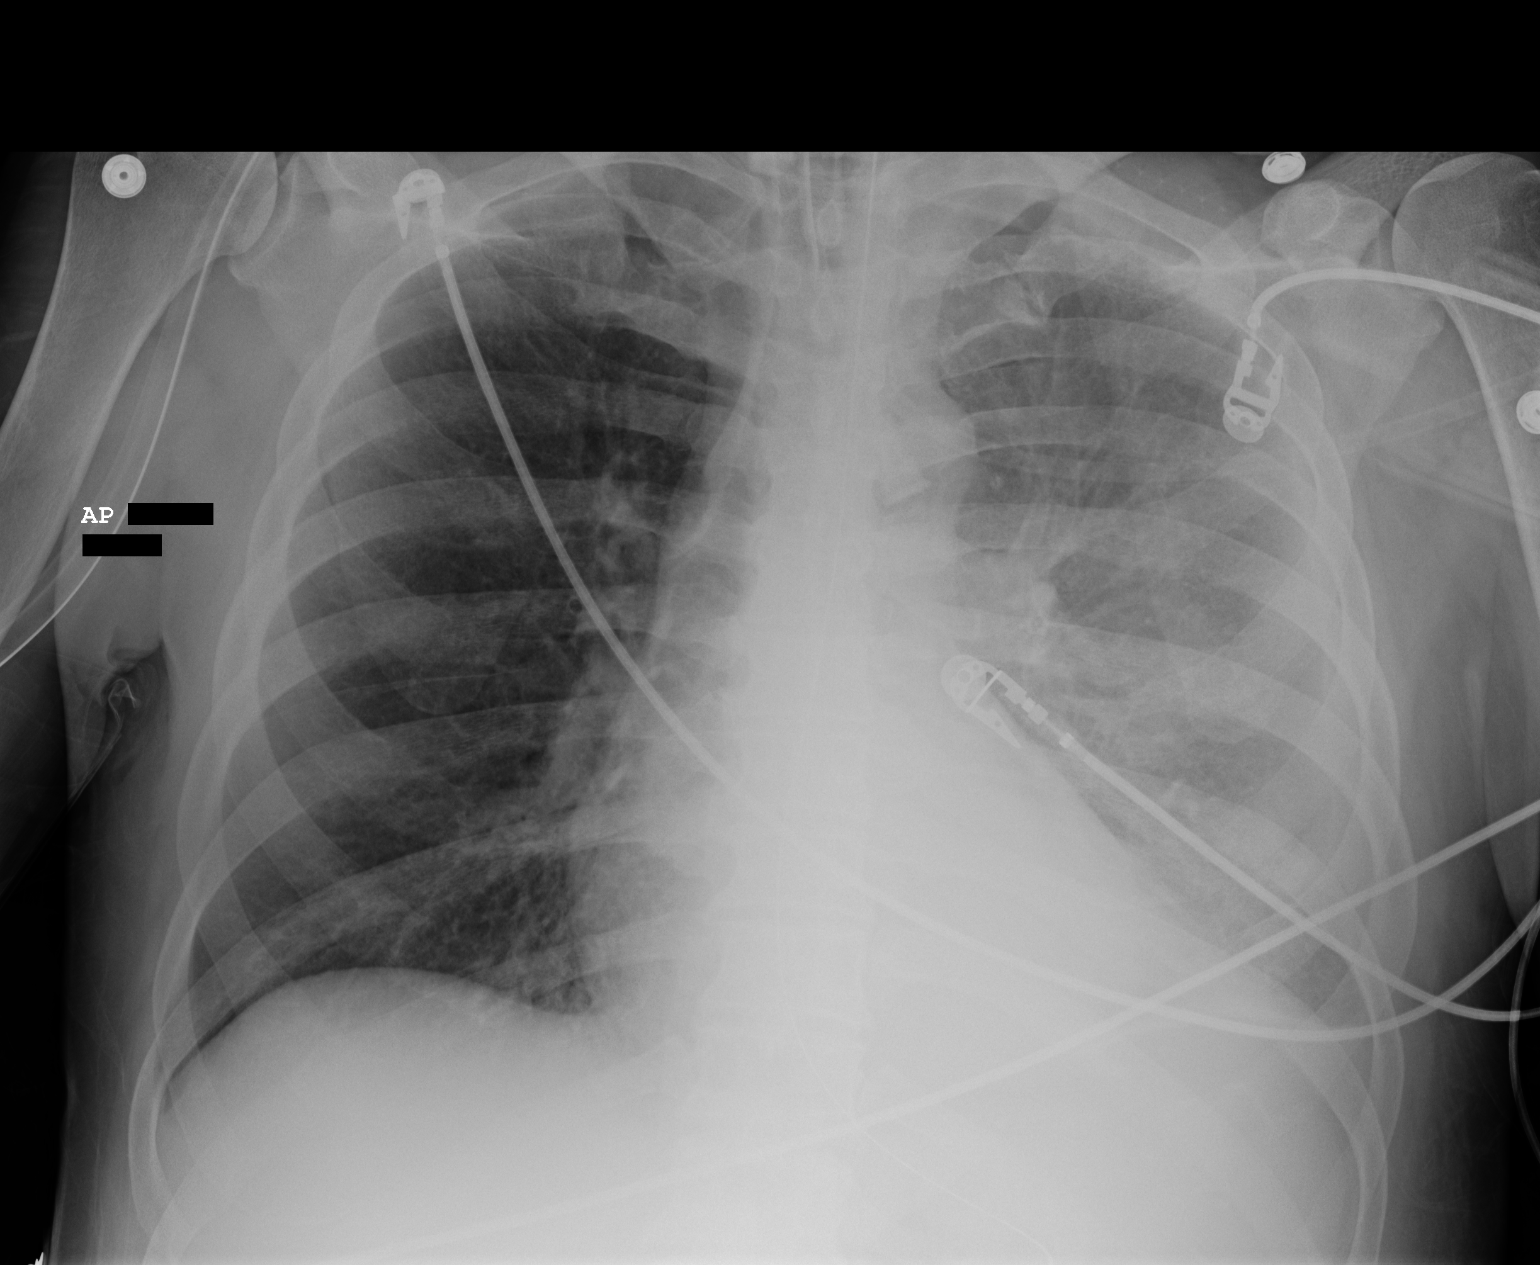

[1 of 1 positions shown; findings below may reference images not displayed]

PROCEDURE:     DXR - DXR PORTABLE CHEST SINGLE VIEW  - [DATE] [DATE]

RESULT:     AP view of the chest is compared to the prior exam of [DATE].
Again noted is a diffuse infiltrate on the LEFT.  Infiltrate appears
improved as compared to the prior exam.  The RIGHT lung field is now clear.
The interstitial thickening on the RIGHT present on the prior exam is no
longer seen.  Endotracheal tube remains present with the tip approximately 6
cm above the carina.  A nasogastric tube is visualized with the distal
portion noted inferior to the LEFT hemidiaphragm in the region of the
stomach.
IMPRESSION: There is an improving diffuse infiltrate on the LEFT compatible with
improving pneumonia.

The diffuse interstitial thickening on the RIGHT previously present is no
longer seen.

No new infiltrates are identified.

Endotracheal and nasogastric tubes are again observed.

## 2005-12-04 IMAGING — CR DG CHEST 1V PORT
1 series · 1 of 1 positions shown · non-contrast
Comparison: none

REASON FOR EXAM: Reassess lung status
COMMENTS:

[view not recorded]
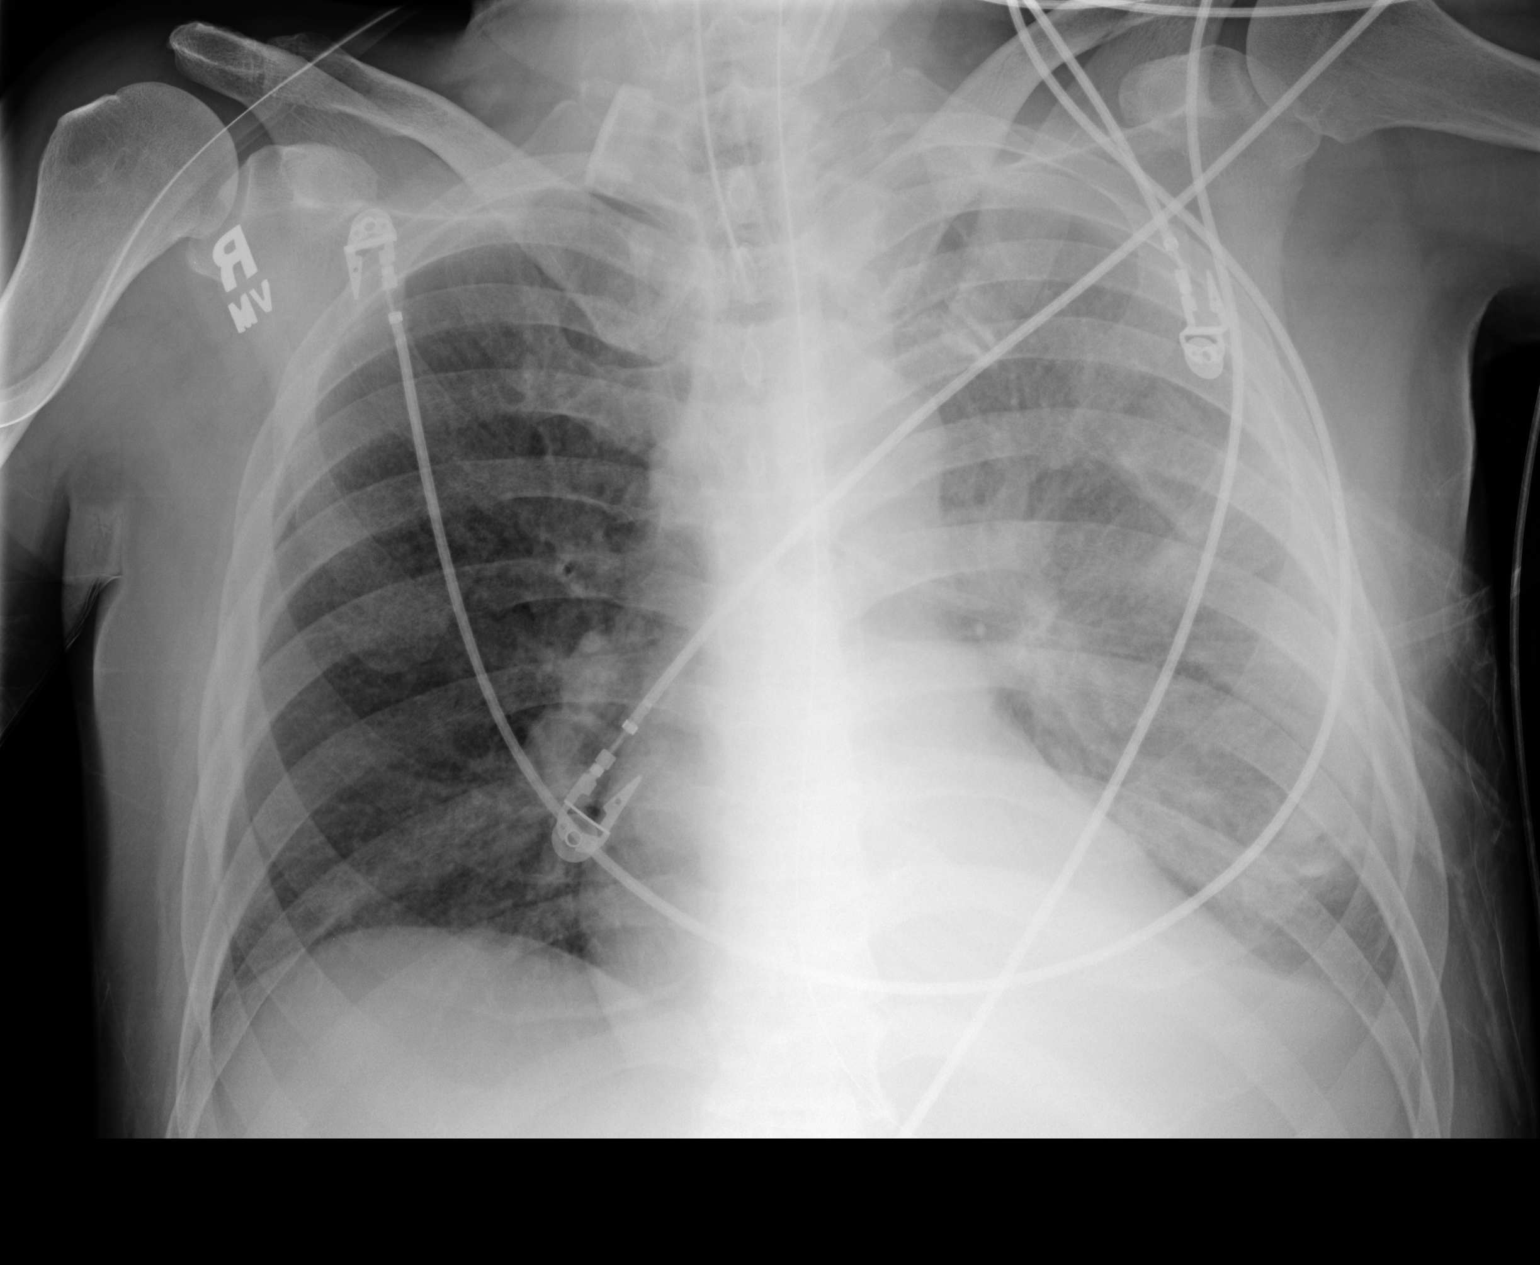

[1 of 1 positions shown; findings below may reference images not displayed]

PROCEDURE:     DXR - DXR PORTABLE CHEST SINGLE VIEW  - [DATE]  [DATE]

RESULT:     AP view of the chest shows a diffuse hazy increase in density of
the LEFT lung.  There has been slight progressive improvement since the exam
of [DATE] with there being continued progressive improvement since the exam
of [DATE].  The RIGHT lung field remains clear.  Heart size is normal.  An
endotracheal tube is present with the tip approximately 6.7 cm above the
carina.  The distal portion of the nasogastric tube is visualized in the
region of the stomach.  Monitoring electrodes are again noted.
IMPRESSION: There is noted slight but further clearing of the diffuse hazy infiltrate on
the LEFT.

The RIGHT lung field is clear.

## 2005-12-06 IMAGING — CR DG CHEST 1V PORT
1 series · 2 of 2 positions shown · non-contrast
Comparison: none

REASON FOR EXAM: pneumonia
COMMENTS:

[Series 1: view not recorded · 0.17mm/px · 2 of 2 slices shown]
[im 1/2]
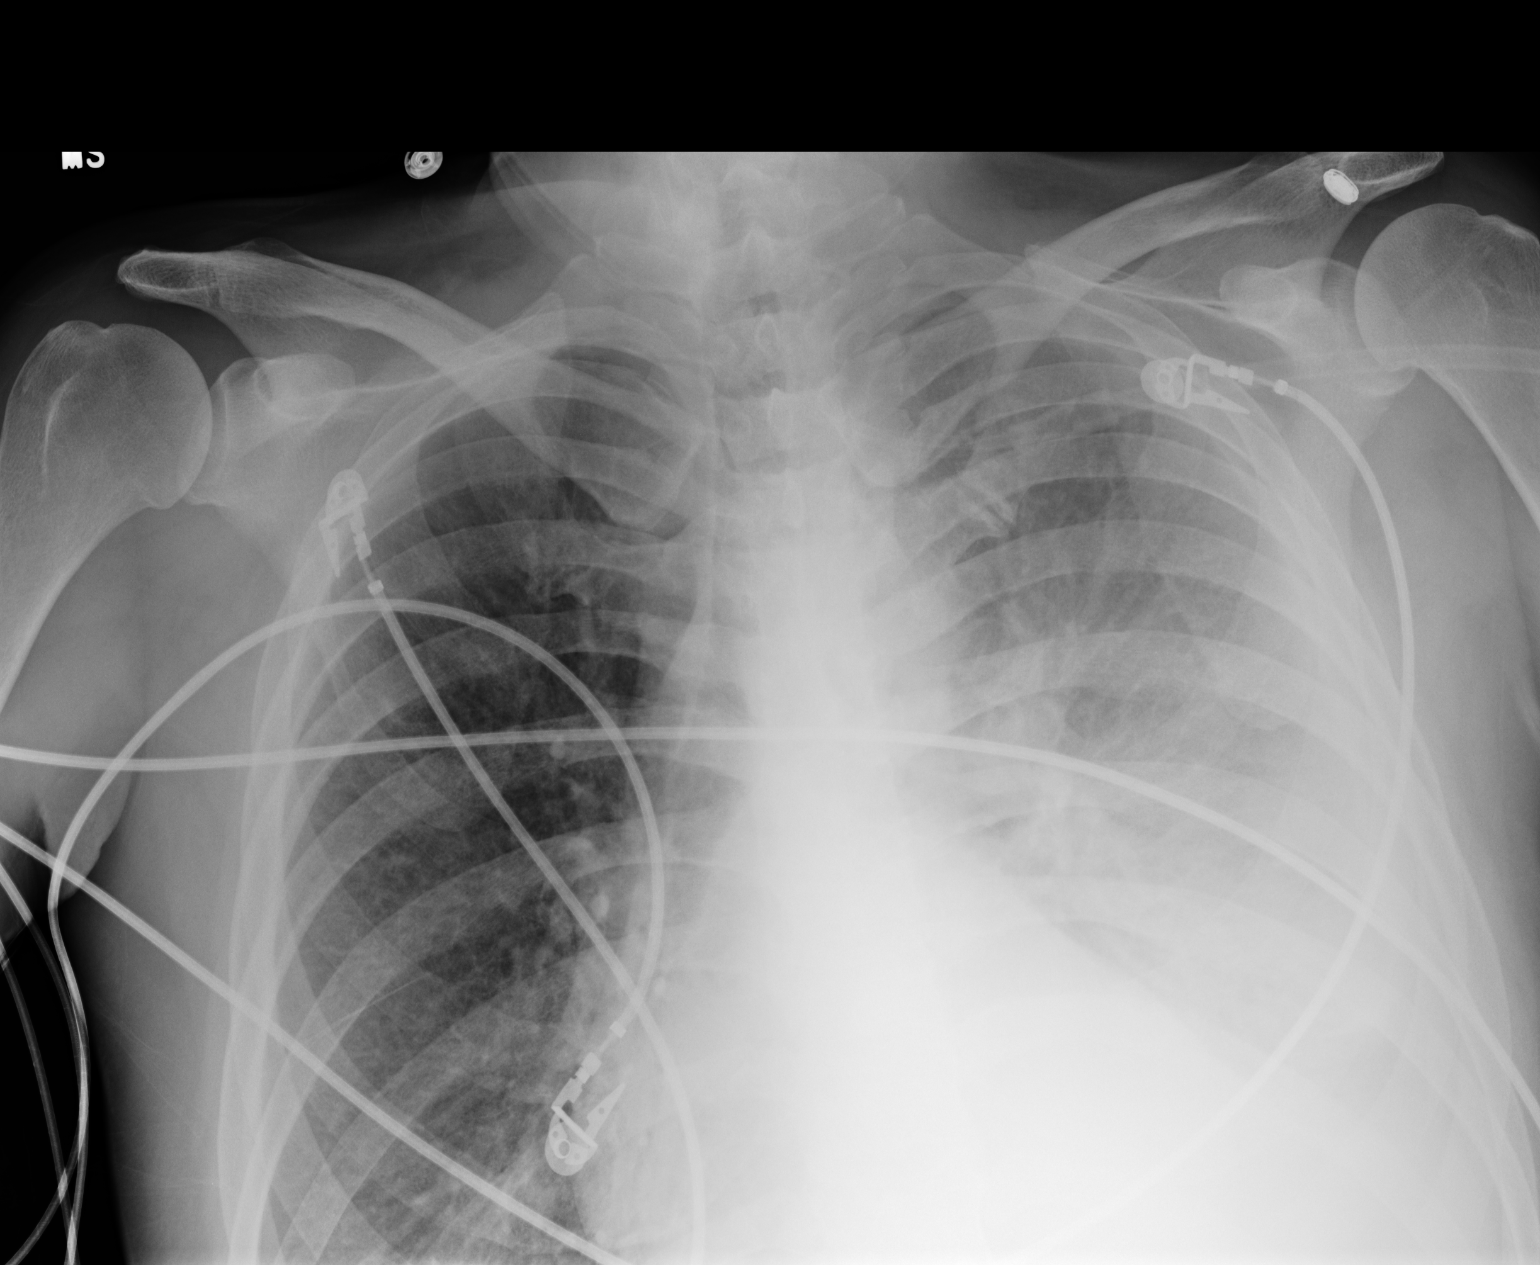
[im 2/2]
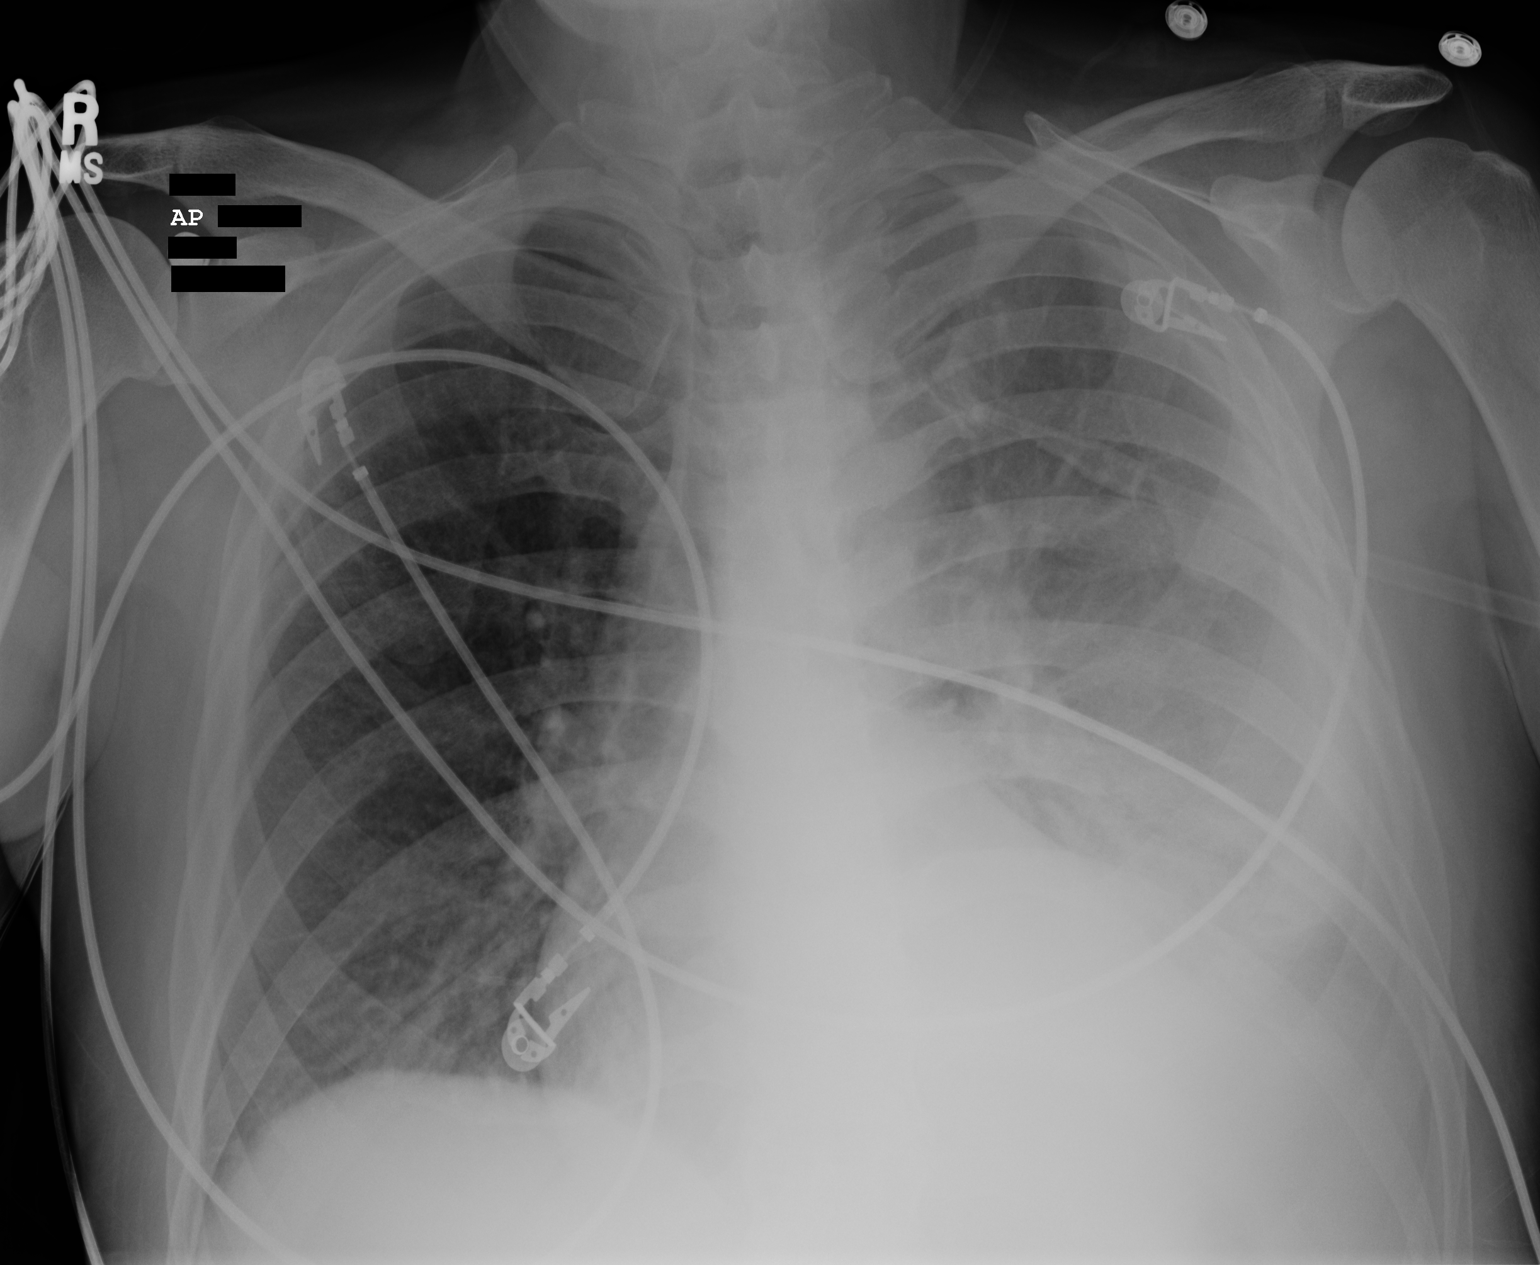

[2 of 2 positions shown; findings below may reference images not displayed]

PROCEDURE:     DXR - DXR PORTABLE CHEST SINGLE VIEW  - [DATE]  [DATE]

RESULT:     AP view of the chest is compared to the prior exam of [DATE].
The previously present endotracheal and nasogastric tubes have been removed.
 There is again noted a hazy increase in density over the LEFT lung field
predominantly the LEFT base. The density is slightly greater on the current
exam, but this is likely secondary to the difference in inspiration.  The
RIGHT lung field remains clear. Heart size is stable. Cardiac monitoring
electrodes are noted.
IMPRESSION: 1)Stable appearing chest as compared to the exam of yesterday.

2)There persists increased density on the LEFT.

3)The previously present endotracheal and nasogastric tubes have been
removed.

## 2005-12-06 IMAGING — CR DG CHEST 1V PORT
1 series · 1 of 1 positions shown · non-contrast
Comparison: none

REASON FOR EXAM: left lateral decubitus film now
COMMENTS:

[view not recorded]
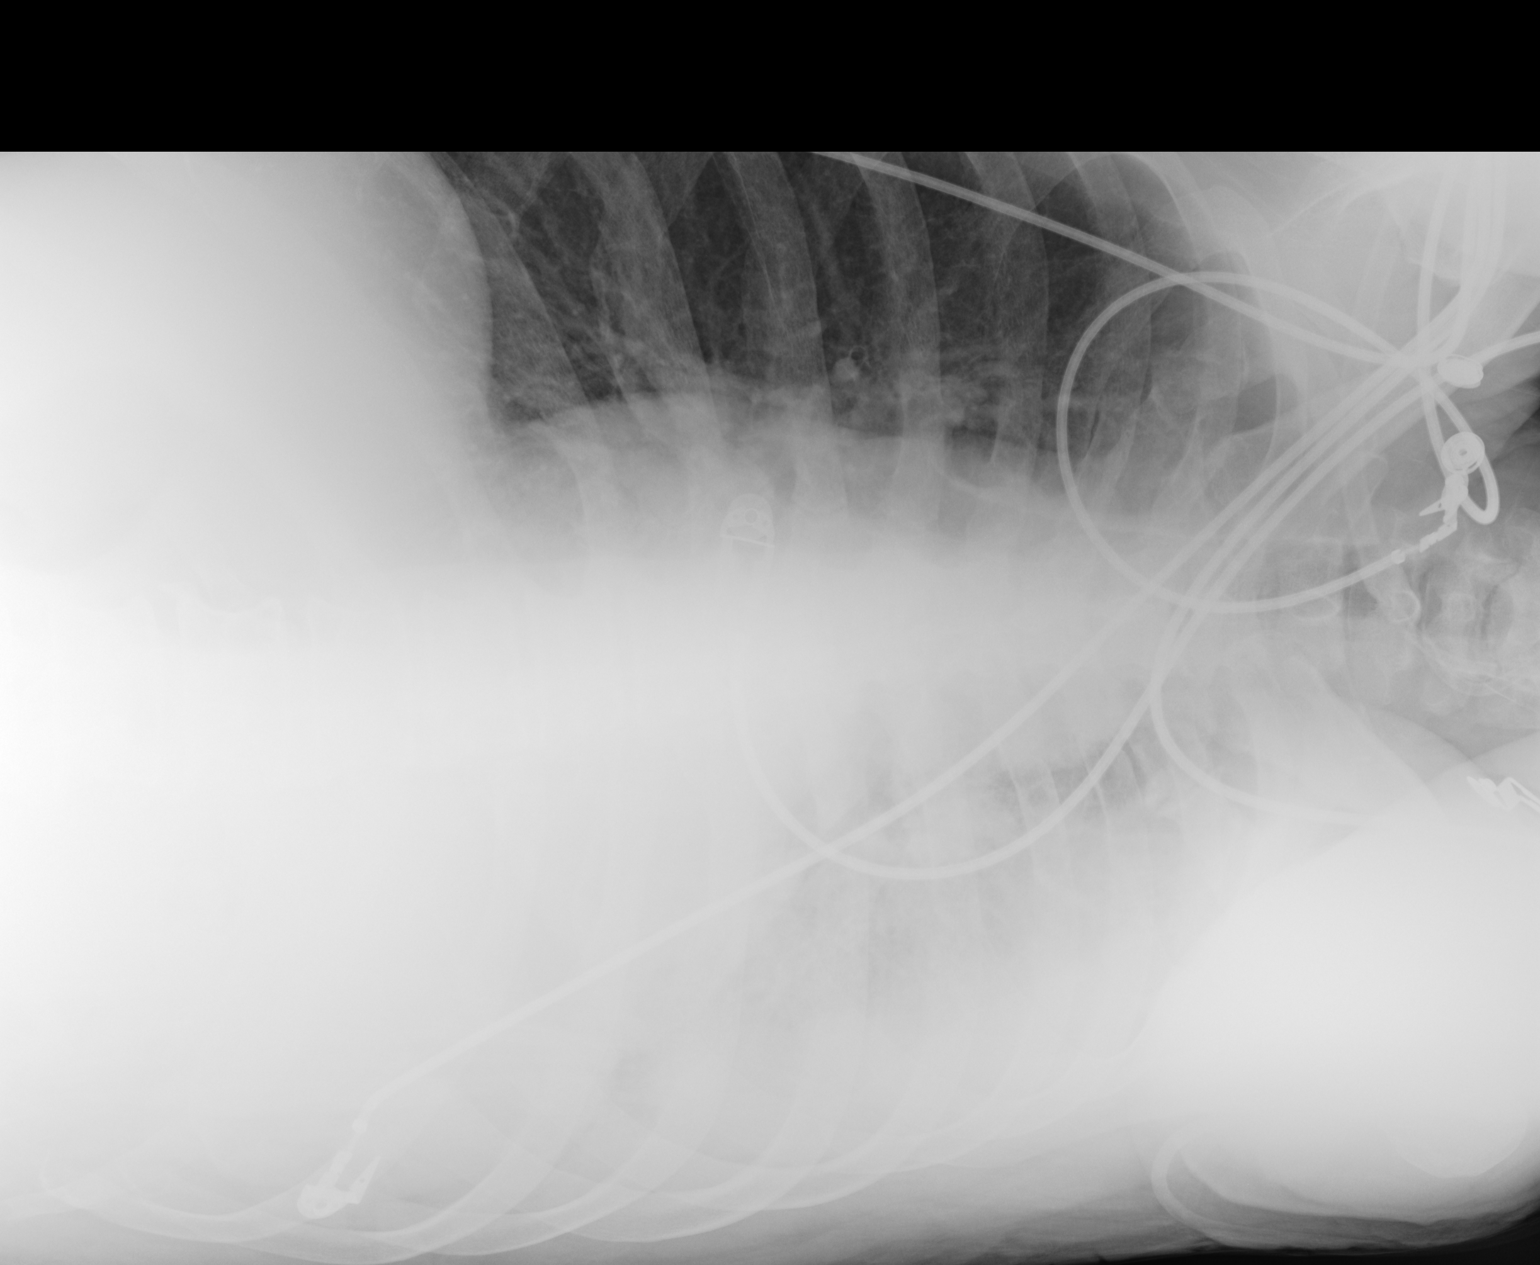

[1 of 1 positions shown; findings below may reference images not displayed]

PROCEDURE:     DXR - DXR PORTABLE CHEST SINGLE VIEW  - [DATE]  [DATE]

RESULT:     Portable lateral decubitus view of the chest was obtained. This
view is obtained with the LEFT side down.  There is diffuse increase in
density in the LEFT lung field.  Comparison with the erect view is
difficult, but the density in the LEFT lung may have increased since the
exam of earlier today.  No definite layering of free pleural fluid is seen.
The effusion noted on prior CT is apparently loculated.  The RIGHT lung
field is clear.
IMPRESSION: 1)Please see above.

## 2005-12-10 IMAGING — US US GUIDE NEEDLE - US [PERSON_NAME]
1 series · 5 of 5 positions shown · non-contrast
Comparison: none

REASON FOR EXAM: High WBC, worse CXR, has been on antibiotics x 10 days
COMMENTS:  LMP: (Male)

PROCEDURE:     US  - US GUIDED THORACENTESIS LEFT  - [DATE]  [DATE]
RESULT:
HISTORY: Pleural effusion.
Ultrasound of the LEFT breast was performed and reveals a small LEFT pleural
effusion.  A 14-gauge angiocatheter was advanced into the fluid and 50 ml of
amber fluid removed without immediate complication.

[Series 1: us guide needle - us (person_name) · 5 of 5 slices shown]
[im 1/5]
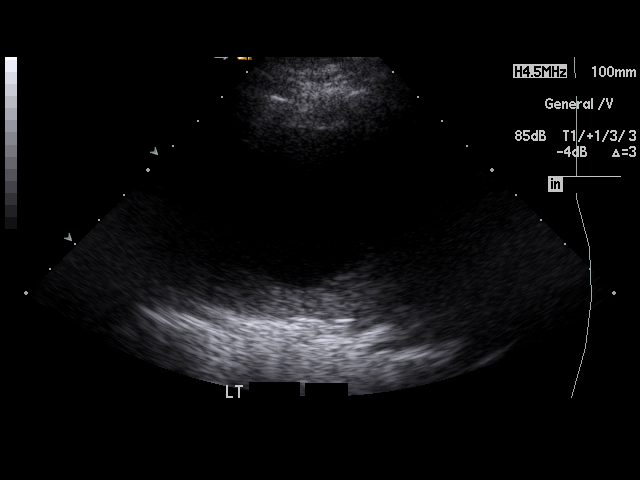
[im 2/5]
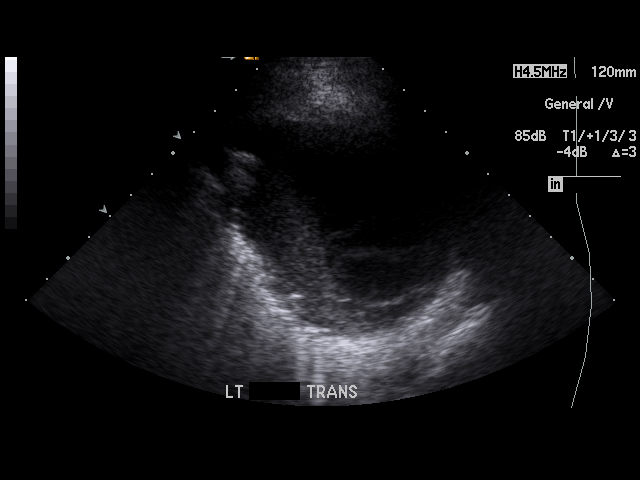
[im 3/5]
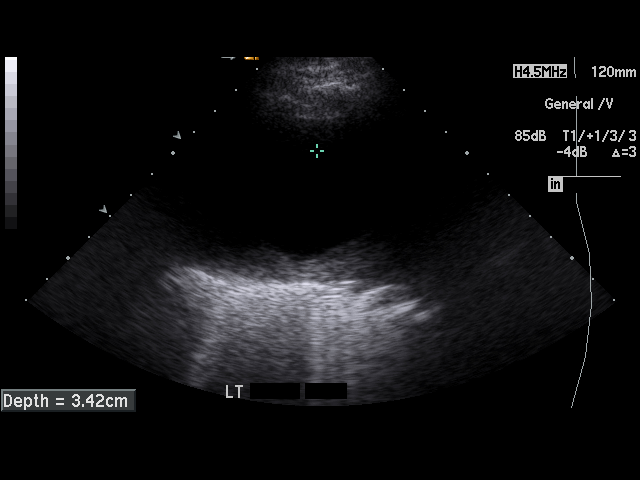
[im 4/5]
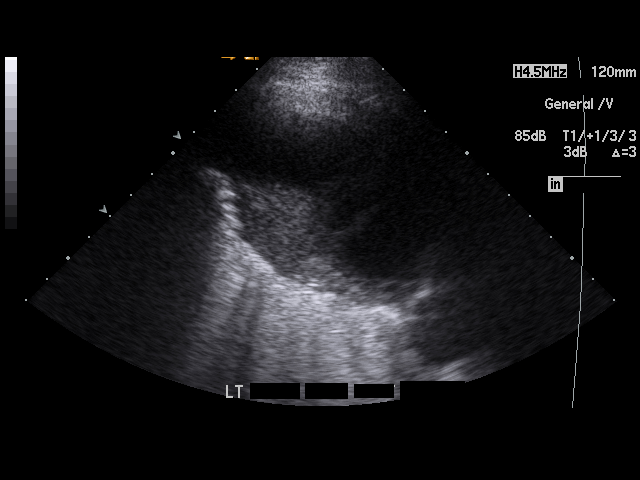
[im 5/5]
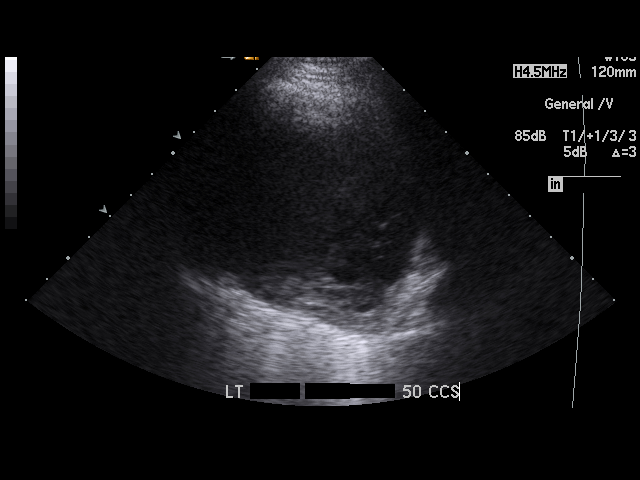

[5 of 5 positions shown; findings below may reference images not displayed]

IMPRESSION: Successful ultrasound directed thoracentesis.

## 2005-12-10 IMAGING — CR DG CHEST 1V
1 series · 1 of 1 positions shown · non-contrast
Comparison: none

REASON FOR EXAM: Status post LEFT thoracentesis
COMMENTS:

PROCEDURE:     DXR - DXR CHEST 1 VIEWAP OR PA  - [DATE]  [DATE]
RESULT:     Following thoracentesis no evidence of pneumothorax is noted.
Infiltrate and pleural thickening is noted on the LEFT.

[view not recorded]
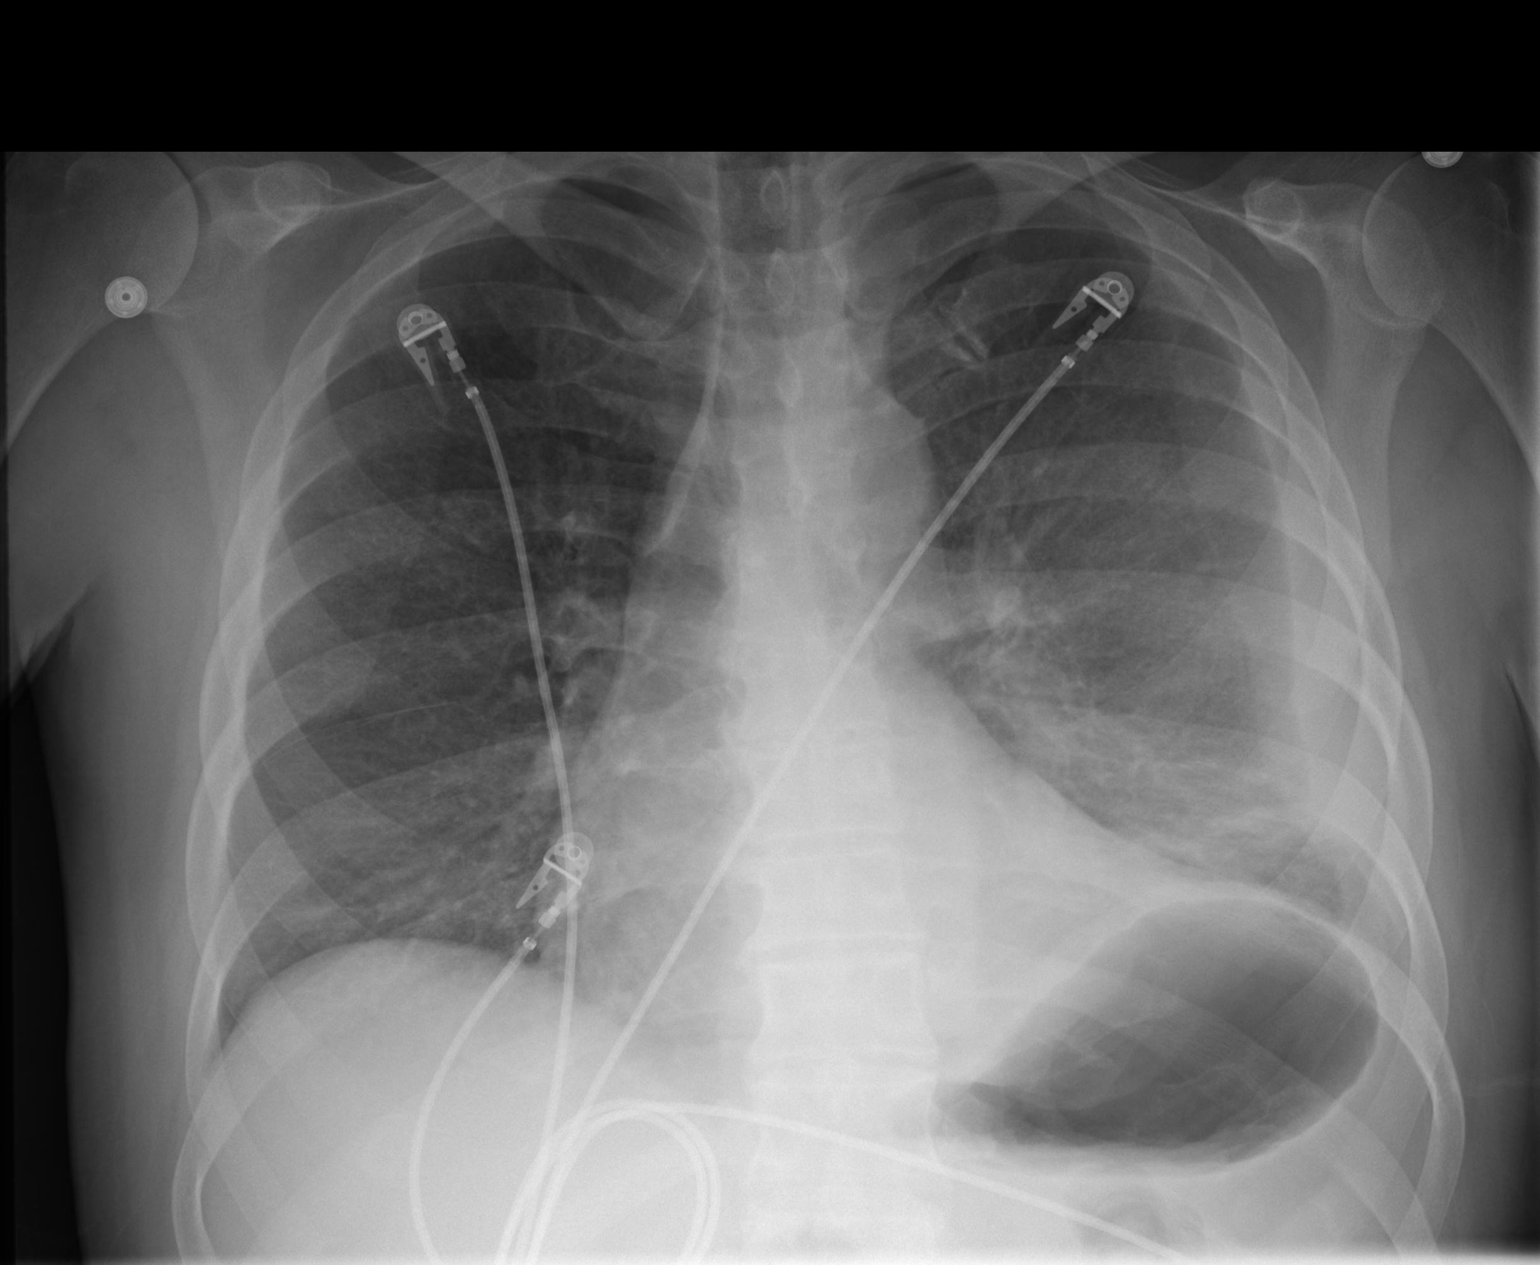

[1 of 1 positions shown; findings below may reference images not displayed]

IMPRESSION: No evidence of pneumothorax immediately post thoracentesis.

## 2005-12-20 ENCOUNTER — Ambulatory Visit: Payer: Self-pay | Admitting: Internal Medicine

## 2005-12-21 ENCOUNTER — Ambulatory Visit: Payer: Self-pay | Admitting: Internal Medicine

## 2006-02-20 ENCOUNTER — Encounter: Payer: Self-pay | Admitting: Internal Medicine

## 2006-02-23 ENCOUNTER — Encounter: Payer: Self-pay | Admitting: Internal Medicine

## 2006-03-06 ENCOUNTER — Emergency Department: Payer: Self-pay | Admitting: Unknown Physician Specialty

## 2006-03-06 ENCOUNTER — Other Ambulatory Visit: Payer: Self-pay

## 2006-03-06 IMAGING — CR DG CHEST 1V PORT
1 series · 1 of 1 positions shown · non-contrast
Comparison: none

REASON FOR EXAM: Weakness.  Rm 9
COMMENTS:  LMP: (Male)

[view not recorded]
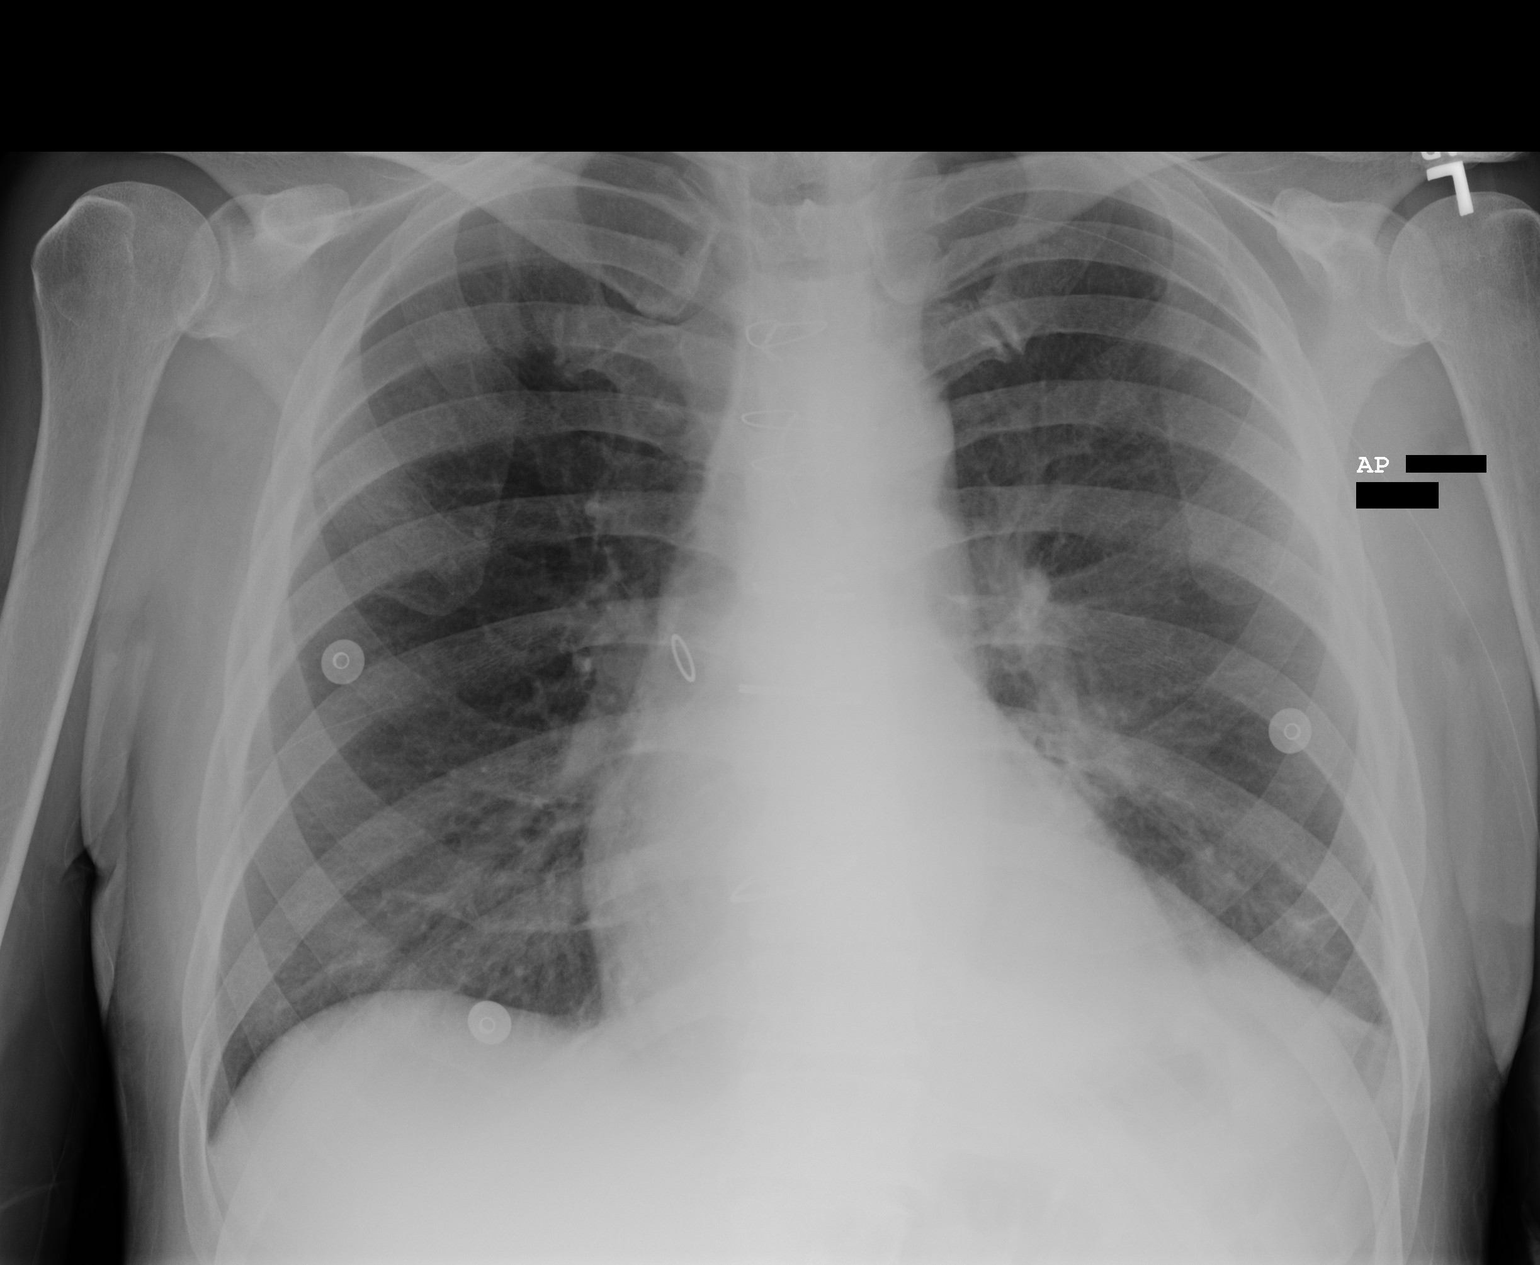

[1 of 1 positions shown; findings below may reference images not displayed]

PROCEDURE:     DXR - DXR PORTABLE CHEST SINGLE VIEW  - [DATE]  [DATE]

RESULT:     An AP view of the chest shows increased density at the left base
compatible with atelectasis or pneumonia. No definite recurrent pleural
effusion is seen. The right lung field is clear. No acute changes of the
heart or pulmonary vasculature are seen. No pneumothorax is observed.
IMPRESSION: 1.     There is increased density at the left base compatible with pneumonia
or atelectasis. Follow-up examination until clear is recommended.

## 2006-03-13 ENCOUNTER — Ambulatory Visit: Payer: Self-pay

## 2006-03-16 ENCOUNTER — Ambulatory Visit: Payer: Self-pay

## 2006-03-20 ENCOUNTER — Ambulatory Visit: Payer: Self-pay

## 2006-05-03 ENCOUNTER — Ambulatory Visit: Payer: Self-pay | Admitting: Internal Medicine

## 2006-05-06 ENCOUNTER — Encounter: Payer: Self-pay | Admitting: Internal Medicine

## 2006-05-25 ENCOUNTER — Encounter: Payer: Self-pay | Admitting: Internal Medicine

## 2006-06-25 ENCOUNTER — Encounter: Payer: Self-pay | Admitting: Internal Medicine

## 2006-07-26 ENCOUNTER — Encounter: Payer: Self-pay | Admitting: Internal Medicine

## 2008-01-22 ENCOUNTER — Ambulatory Visit: Payer: Self-pay | Admitting: Unknown Physician Specialty

## 2008-02-05 ENCOUNTER — Ambulatory Visit: Payer: Self-pay | Admitting: Internal Medicine

## 2008-02-05 IMAGING — CR DG CHEST 2V
1 series · 2 of 2 positions shown · non-contrast
Comparison: none

REASON FOR EXAM: PNEUMONIA  CALL REPORT "STAT"
COMMENTS:

[Series 1: view not recorded · 0.17mm/px · 2 of 2 slices shown]
[im 1/2]
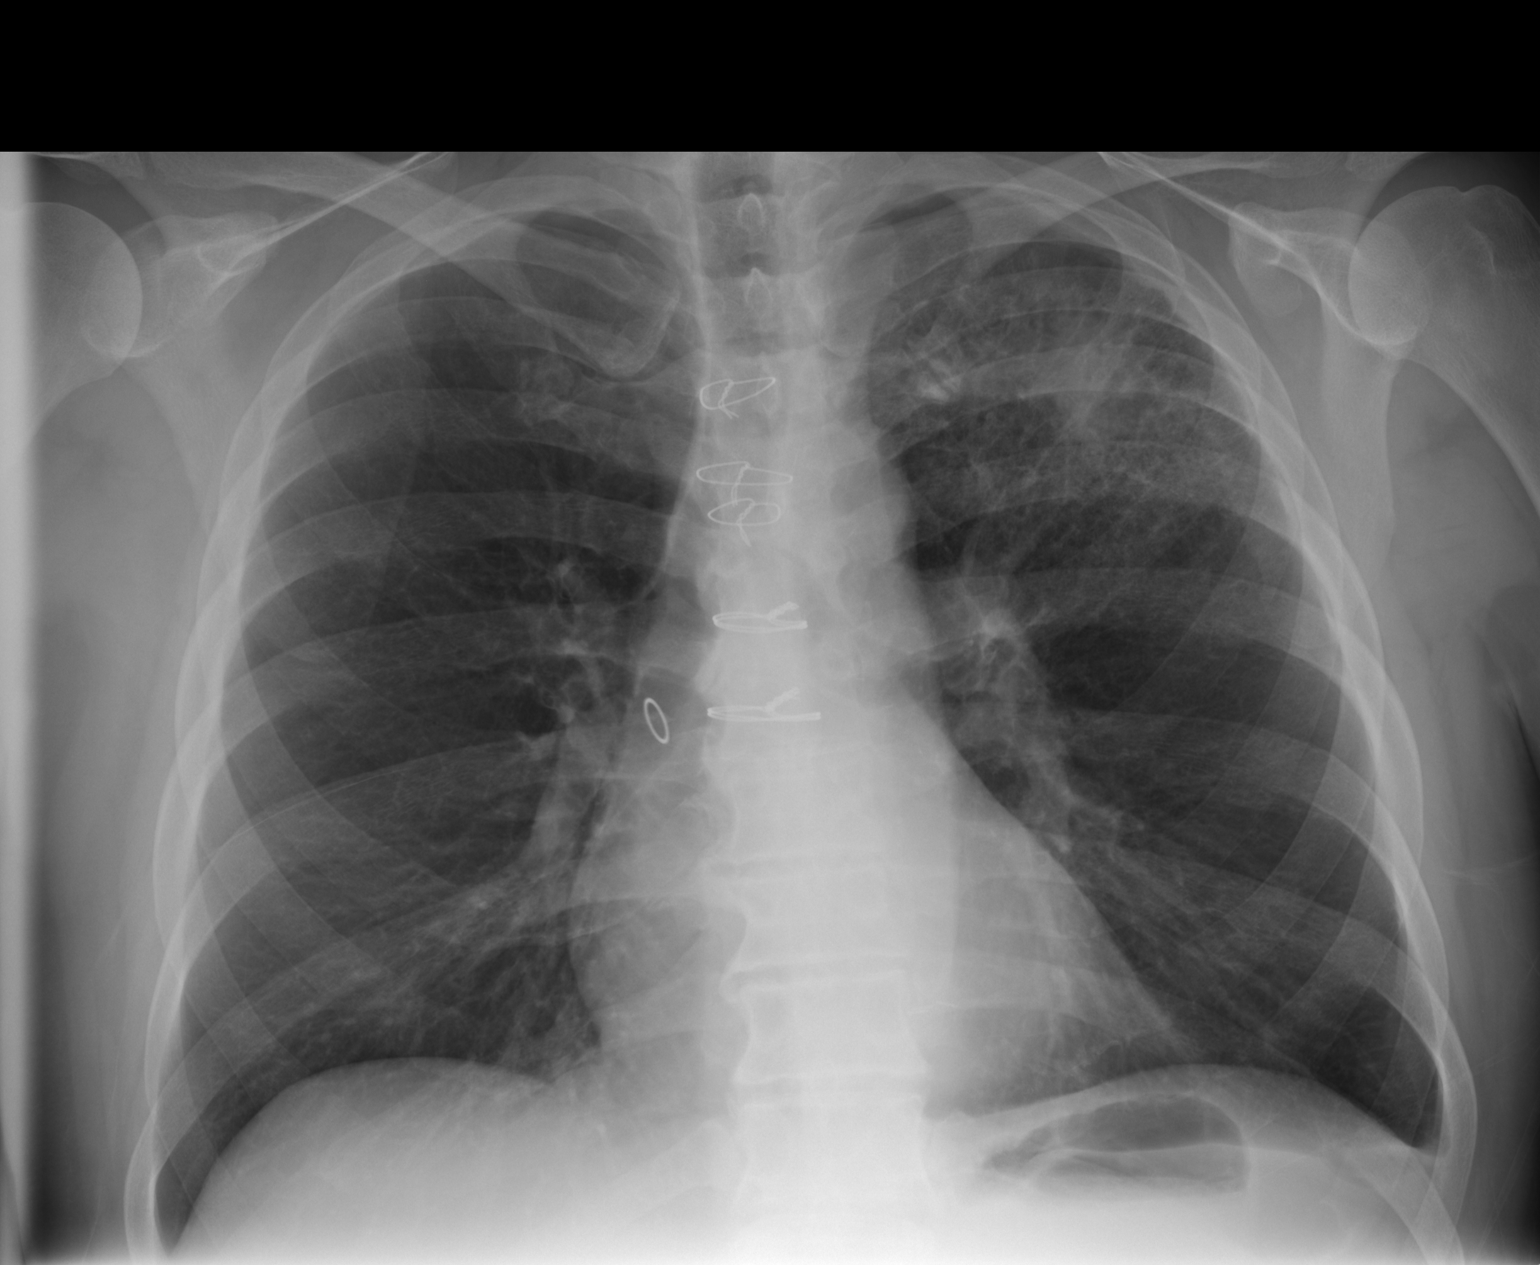
[im 2/2]
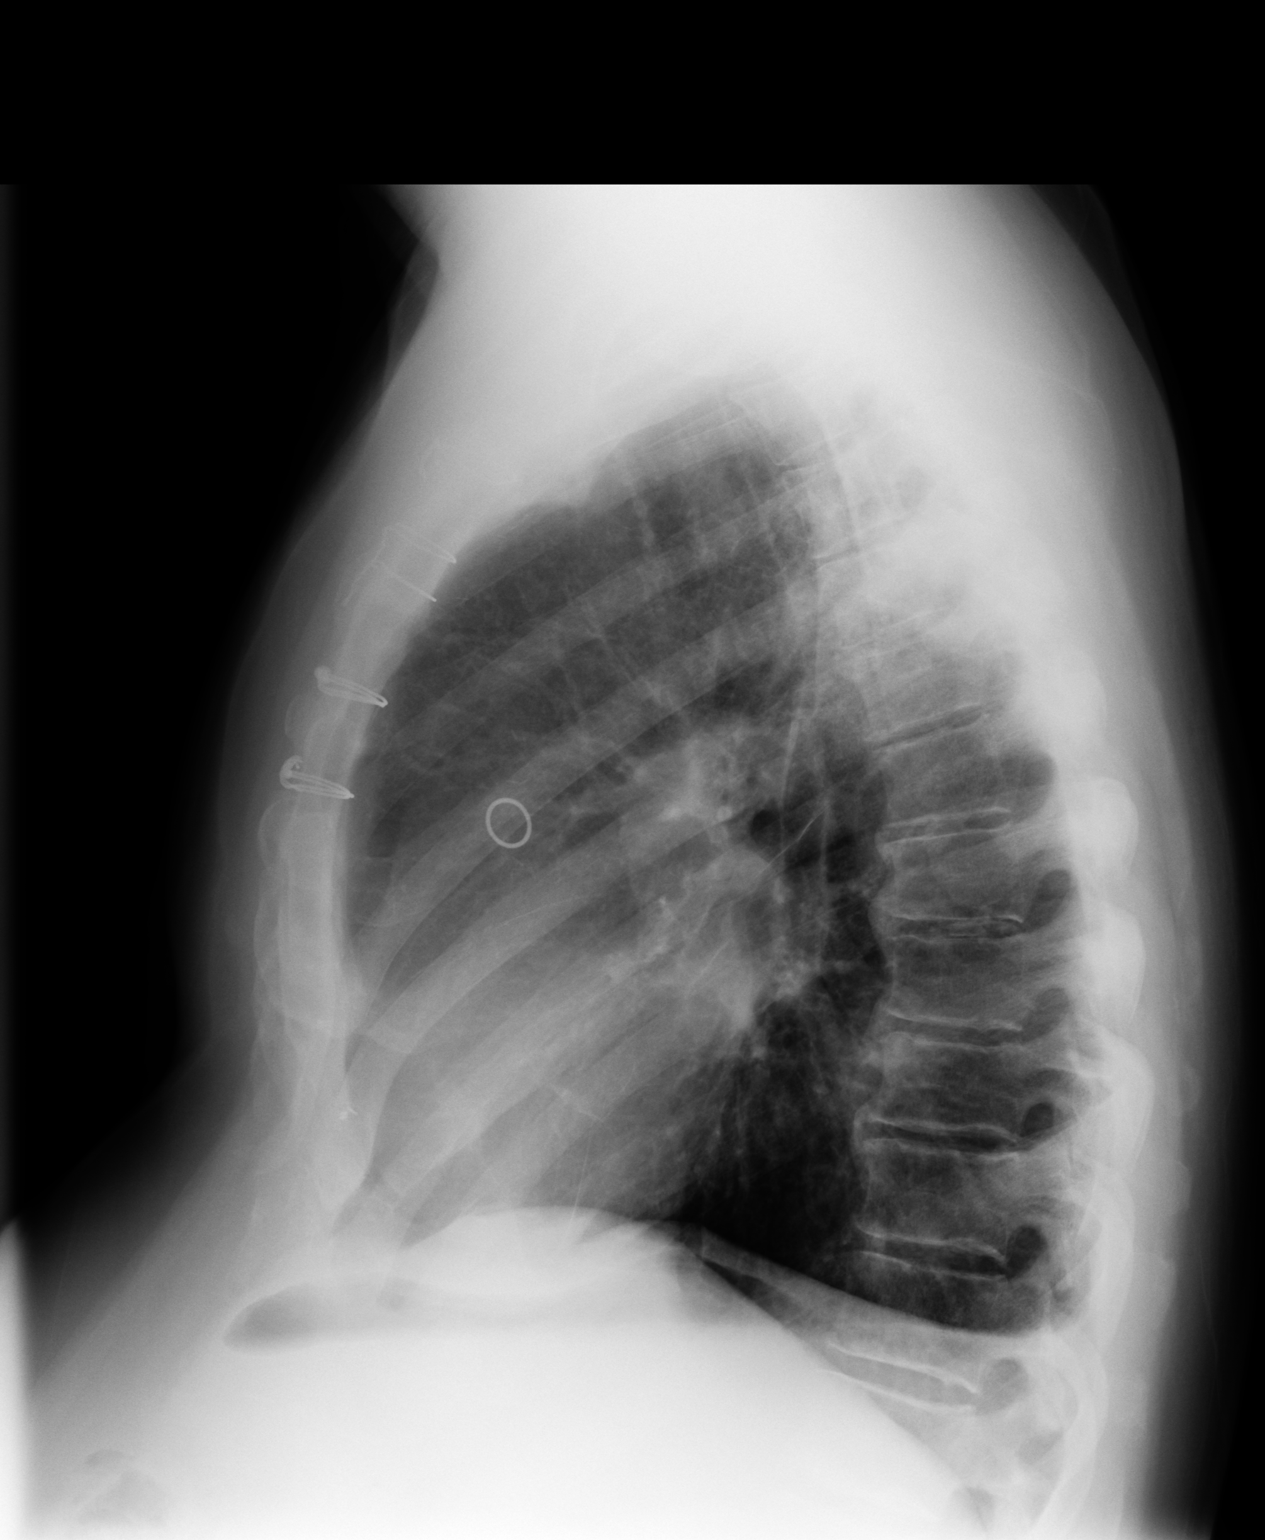

[2 of 2 positions shown; findings below may reference images not displayed]

PROCEDURE:     DXR - DXR CHEST PA (OR AP) AND LATERAL  - [DATE]  [DATE]

RESULT:     There is abnormal density in the LEFT upper lobe posteriorly.
This suggests an interstitial type pneumonia. Otherwise, the lungs appear
clear. There is a small pleural effusion on the LEFT. The patient has
undergone prior CABG.
IMPRESSION: There is an interstitial type infiltrate in the LEFT upper
lobe. There is also a small LEFT pleural effusion. Followup imaging is
recommended following therapy to assure complete clearing.

This report was called to Dr. [REDACTED] at the conclusion of the study.

## 2008-02-06 ENCOUNTER — Inpatient Hospital Stay: Payer: Self-pay | Admitting: Specialist

## 2008-02-06 IMAGING — CR DG CHEST 1V
1 series · 2 of 2 positions shown · non-contrast
Comparison: none

REASON FOR EXAM: LEFT LATERAL DECUBITUS FOR PNA WITH EFFUSION.  DOES IT
LAYER/CAN IT BE TAPPED?
COMMENTS:

[Series 1: view not recorded · 0.17mm/px · 2 of 2 slices shown]
[im 1/2]
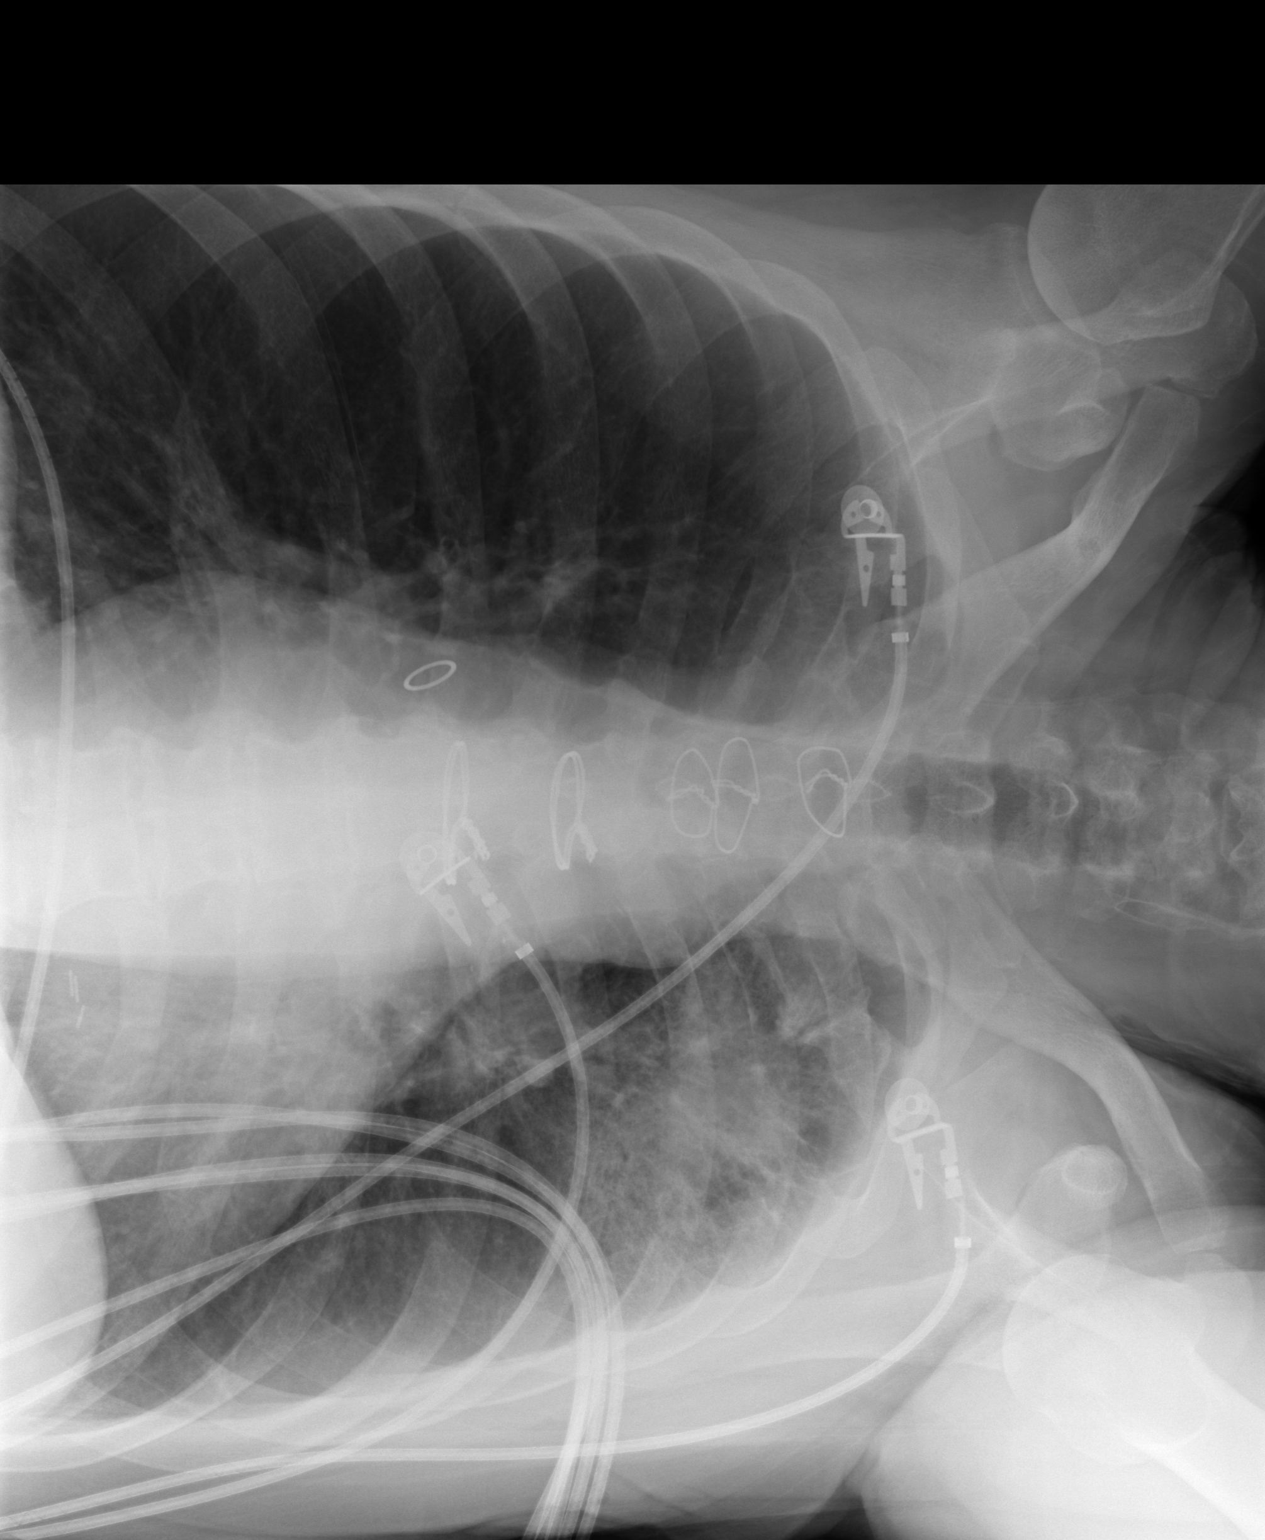
[im 2/2]
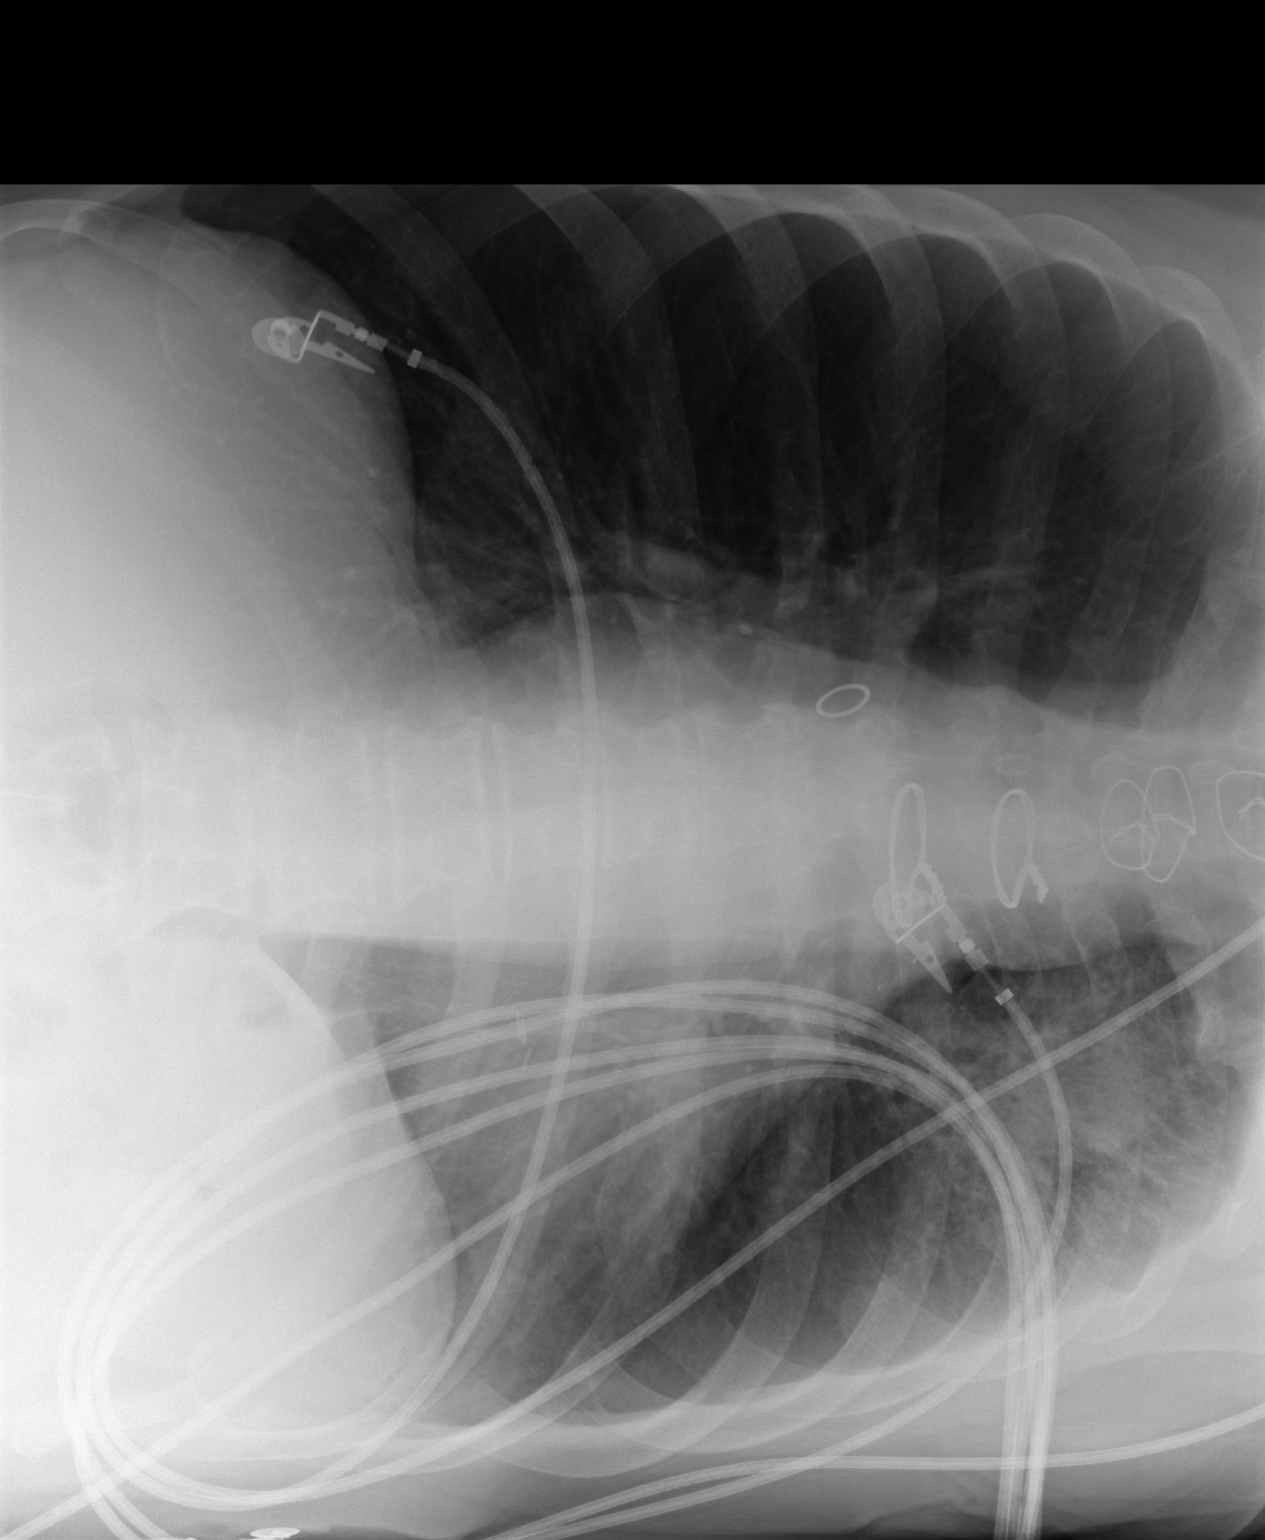

[2 of 2 positions shown; findings below may reference images not displayed]

PROCEDURE:     DXR - DXR CHEST SPECIFY LAT OR DECUB  - [DATE]  [DATE]

RESULT:     The patient is undergoing evaluation of the LEFT pleural
effusion. A small effusion layering posteriorly was noted on today's PA and
lateral chest film. On the LEFT-side-down decubitus film, I do not see
evidence of a significant layering component of this fluid.
IMPRESSION: I do not see evidence of significant layering of a pleural effusion on the
LEFT.  The effusion is quite small in any case.

## 2009-03-31 ENCOUNTER — Emergency Department: Payer: Self-pay | Admitting: Emergency Medicine

## 2010-06-25 DIAGNOSIS — C801 Malignant (primary) neoplasm, unspecified: Secondary | ICD-10-CM

## 2010-06-25 HISTORY — DX: Malignant (primary) neoplasm, unspecified: C80.1

## 2011-01-31 ENCOUNTER — Encounter: Payer: Self-pay | Admitting: Internal Medicine

## 2011-03-16 ENCOUNTER — Ambulatory Visit: Payer: Self-pay | Admitting: Unknown Physician Specialty

## 2011-03-23 LAB — HM COLONOSCOPY: HM Colonoscopy: 5

## 2011-04-08 ENCOUNTER — Other Ambulatory Visit: Payer: Self-pay | Admitting: Internal Medicine

## 2011-04-08 DIAGNOSIS — D126 Benign neoplasm of colon, unspecified: Secondary | ICD-10-CM

## 2011-04-10 ENCOUNTER — Ambulatory Visit (INDEPENDENT_AMBULATORY_CARE_PROVIDER_SITE_OTHER): Payer: BC Managed Care – PPO | Admitting: Internal Medicine

## 2011-04-10 ENCOUNTER — Encounter: Payer: Self-pay | Admitting: Internal Medicine

## 2011-04-10 VITALS — BP 118/72 | HR 93 | Temp 97.6°F | Wt 174.0 lb

## 2011-04-10 DIAGNOSIS — E1169 Type 2 diabetes mellitus with other specified complication: Secondary | ICD-10-CM | POA: Insufficient documentation

## 2011-04-10 DIAGNOSIS — I10 Essential (primary) hypertension: Secondary | ICD-10-CM

## 2011-04-10 DIAGNOSIS — D126 Benign neoplasm of colon, unspecified: Secondary | ICD-10-CM

## 2011-04-10 DIAGNOSIS — E119 Type 2 diabetes mellitus without complications: Secondary | ICD-10-CM

## 2011-04-10 DIAGNOSIS — K635 Polyp of colon: Secondary | ICD-10-CM | POA: Insufficient documentation

## 2011-04-10 DIAGNOSIS — E785 Hyperlipidemia, unspecified: Secondary | ICD-10-CM

## 2011-04-10 LAB — MICROALBUMIN / CREATININE URINE RATIO
Creatinine,U: 134 mg/dL
Microalb Creat Ratio: 4.6 mg/g (ref 0.0–30.0)
Microalb, Ur: 6.1 mg/dL — ABNORMAL HIGH (ref 0.0–1.9)

## 2011-04-10 LAB — COMPREHENSIVE METABOLIC PANEL
AST: 22 U/L (ref 0–37)
Alkaline Phosphatase: 140 U/L — ABNORMAL HIGH (ref 39–117)
BUN: 23 mg/dL (ref 6–23)
Calcium: 9.4 mg/dL (ref 8.4–10.5)
Chloride: 101 mEq/L (ref 96–112)
Creatinine, Ser: 1.3 mg/dL (ref 0.4–1.5)

## 2011-04-10 LAB — CBC WITH DIFFERENTIAL/PLATELET
Basophils Relative: 0.5 % (ref 0.0–3.0)
Eosinophils Absolute: 0.2 10*3/uL (ref 0.0–0.7)
Hemoglobin: 14.1 g/dL (ref 13.0–17.0)
Lymphocytes Relative: 19.5 % (ref 12.0–46.0)
MCHC: 33.3 g/dL (ref 30.0–36.0)
Neutro Abs: 7.3 10*3/uL (ref 1.4–7.7)
RBC: 4.86 Mil/uL (ref 4.22–5.81)

## 2011-04-10 MED ORDER — NIACIN ER (ANTIHYPERLIPIDEMIC) 1000 MG PO TBCR: 1000.0000 mg | EXTENDED_RELEASE_TABLET | Freq: Every day | ORAL | Status: DC

## 2011-04-10 MED ORDER — METFORMIN HCL 500 MG PO TABS: 500.0000 mg | ORAL_TABLET | Freq: Two times a day (BID) | ORAL | Status: DC

## 2011-04-10 MED ORDER — ENALAPRIL-HYDROCHLOROTHIAZIDE 10-25 MG PO TABS: 1.0000 | ORAL_TABLET | Freq: Every day | ORAL | Status: DC

## 2011-04-10 MED ORDER — ATORVASTATIN CALCIUM 40 MG PO TABS: 40.0000 mg | ORAL_TABLET | Freq: Every day | ORAL | Status: DC

## 2011-04-10 MED ORDER — ALLOPURINOL 100 MG PO TABS: 100.0000 mg | ORAL_TABLET | Freq: Every day | ORAL | Status: DC

## 2011-04-10 MED ORDER — TAMSULOSIN HCL 0.4 MG PO CAPS: 0.4000 mg | ORAL_CAPSULE | Freq: Every day | ORAL | Status: DC

## 2011-04-10 NOTE — Progress Notes (Signed)
  Subjective:    [name], male    DOB: [birth year], 65 y.o.   MRN: [id number]  HPI    Review of Systems     Objective:   Physical Exam        Assessment & Plan:   Subjective:     Nathaniel Lane is a 65 y.o. male who presents for follow up of diabetes.. Current symptoms include: none. Patient denies foot ulcerations, hypoglycemia  and visual disturbances. Evaluation to date has been: fasting blood sugar, fasting lipid panel, hemoglobin A1C and microalbuminuria. Home sugars: BGs consistently in an acceptable range. Current treatments: Continued metformin which has been effective. Last dilated eye exam less than one year ago  The following portions of the patient's history were reviewed and updated as appropriate: allergies, current medications, past family history, past medical history, past social history, past surgical history and problem list.  Review of Systems A comprehensive review of systems was negative.    Objective:    BP 118/72  Pulse 93  Temp 97.6 F (36.4 C)  Wt 174 lb (78.926 kg)  SpO2 96% General appearance: alert, cooperative and appears stated age Ears: normal TM's and external ear canals both ears Neck: no adenopathy, no carotid bruit, no JVD, supple, symmetrical, trachea midline and thyroid not enlarged, symmetric, no tenderness/mass/nodules Lungs: clear to auscultation bilaterally Chest wall: no tenderness Heart: regular rate and rhythm, S1, S2 normal, no murmur, click, rub or gallop Abdomen: soft, non-tender; bowel sounds normal; no masses,  no organomegaly Extremities: extremities normal, atraumatic, no cyanosis or edema    @DMFOOTEXAM @  Patient was evaluated for proper footwear and sizing.  Laboratory: No components found with this basename: A1C      Assessment:    Diabetes mellitus Type II, under excellent control.    Plan:    Discussed general issues about diabetes pathophysiology and management. Encouraged aerobic  exercise. Reminded to get yearly retinal exam. Continued sulfonylurea; see medication orders.  Continued ACE inhibitor; see medication orders. Labs: hemoglobin A1C and microalbuminuria.

## 2011-04-10 NOTE — Assessment & Plan Note (Signed)
Well controlled on current medications.  No changes today. 

## 2011-05-04 ENCOUNTER — Encounter: Payer: Self-pay | Admitting: Internal Medicine

## 2011-06-26 DIAGNOSIS — C73 Malignant neoplasm of thyroid gland: Secondary | ICD-10-CM

## 2011-06-26 HISTORY — DX: Malignant neoplasm of thyroid gland: C73

## 2011-07-11 ENCOUNTER — Ambulatory Visit (INDEPENDENT_AMBULATORY_CARE_PROVIDER_SITE_OTHER): Payer: BC Managed Care – PPO | Admitting: Internal Medicine

## 2011-07-11 ENCOUNTER — Encounter: Payer: Self-pay | Admitting: Internal Medicine

## 2011-07-11 DIAGNOSIS — E785 Hyperlipidemia, unspecified: Secondary | ICD-10-CM

## 2011-07-11 DIAGNOSIS — I25810 Atherosclerosis of coronary artery bypass graft(s) without angina pectoris: Secondary | ICD-10-CM

## 2011-07-11 DIAGNOSIS — Z79899 Other long term (current) drug therapy: Secondary | ICD-10-CM

## 2011-07-11 DIAGNOSIS — Z8701 Personal history of pneumonia (recurrent): Secondary | ICD-10-CM

## 2011-07-11 DIAGNOSIS — M109 Gout, unspecified: Secondary | ICD-10-CM

## 2011-07-11 DIAGNOSIS — E119 Type 2 diabetes mellitus without complications: Secondary | ICD-10-CM

## 2011-07-11 DIAGNOSIS — E1129 Type 2 diabetes mellitus with other diabetic kidney complication: Secondary | ICD-10-CM

## 2011-07-11 DIAGNOSIS — I10 Essential (primary) hypertension: Secondary | ICD-10-CM

## 2011-07-11 MED ORDER — IBUPROFEN 800 MG PO TABS: 800.0000 mg | ORAL_TABLET | Freq: Three times a day (TID) | ORAL | Status: AC | PRN

## 2011-07-11 NOTE — Patient Instructions (Signed)
I have prescribed ibupfroen 800 mg every 8 hours as needed for gout flare.  You should take OTC prilosec ( available generically as omeprazole) to protect your stomach for a few days.  Return  in 2 or 3 weeks for your fasting labs and we'll recheck a uric acid level.,  Do not stop the allopurinol, but return for labs 3 or 4 days before you run out of current bottle

## 2011-07-11 NOTE — Progress Notes (Signed)
Subjective:    [name], male    DOB: 08-17-1945, 66 y.o.   MRN: [id number]  HPI  Mr.  Lane is a 66 yo white male with a history of CAD s/p NQWMI s/p CABG 2007,   prior pneumonia requiring intubation 2007  presents for diabetes follow up.  His cc is a gout flare which started 3 days ago involving the left ankle and heel.  Ankle  is warm to the touch but not red. He has had 3 or 4 flares in the past year .  Pain with weight bearing,.  No fevers or recent trauma,  Blood sugars per log brought in by patient have been between 120 to 140 fasting since November.  Past Medical History  Diagnosis Date  . Diabetes mellitus   . Hyperlipidemia   . Hypertension   . Myocardial infarction    Current Outpatient Prescriptions on File Prior to Visit  Medication Sig Dispense Refill  . allopurinol (ZYLOPRIM) 100 MG tablet Take 1 tablet (100 mg total) by mouth daily.  90 tablet  3  . aspirin 81 MG tablet Take 81 mg by mouth daily.        Marland Kitchen atorvastatin (LIPITOR) 40 MG tablet Take 1 tablet (40 mg total) by mouth at bedtime.  90 tablet  3  . enalapril-hydrochlorothiazide (VASERETIC) 10-25 MG per tablet Take 1 tablet by mouth at bedtime.  90 tablet  3  . metFORMIN (GLUCOPHAGE) 500 MG tablet Take 1 tablet (500 mg total) by mouth 2 (two) times daily with a meal.  180 tablet  3  . niacin (NIASPAN) 1000 MG CR tablet Take 1 tablet (1,000 mg total) by mouth at bedtime.  90 tablet  3  . Tamsulosin HCl (FLOMAX) 0.4 MG CAPS Take 1 capsule (0.4 mg total) by mouth daily.  90 capsule  3    Review of Systems  Constitutional: Negative for fever, chills, diaphoresis, activity change, appetite change, fatigue and unexpected weight change.  HENT: Negative for hearing loss, ear pain, nosebleeds, congestion, sore throat, facial swelling, rhinorrhea, sneezing, drooling, mouth sores, trouble swallowing, neck pain, neck stiffness, dental problem, voice change, postnasal drip, sinus pressure, tinnitus and ear  discharge.   Eyes: Negative for photophobia, pain, discharge, redness, itching and visual disturbance.  Respiratory: Negative for apnea, cough, choking, chest tightness, shortness of breath, wheezing and stridor.   Cardiovascular: Negative for chest pain, palpitations and leg swelling.  Gastrointestinal: Negative for nausea, vomiting, abdominal pain, diarrhea, constipation, blood in stool, abdominal distention, anal bleeding and rectal pain.  Genitourinary: Negative for dysuria, urgency, frequency, hematuria, flank pain, decreased urine volume, scrotal swelling, difficulty urinating and testicular pain.  Musculoskeletal: Positive for arthralgias. Negative for myalgias, back pain, joint swelling and gait problem.  Skin: Negative for color change, rash and wound.  Neurological: Negative for dizziness, tremors, seizures, syncope, speech difficulty, weakness, light-headedness, numbness and headaches.  Psychiatric/Behavioral: Negative for suicidal ideas, hallucinations, behavioral problems, confusion, sleep disturbance, dysphoric mood, decreased concentration and agitation. The patient is not nervous/anxious.        Objective:   Physical Exam  Constitutional: He is oriented to person, place, and time.  HENT:  Head: Normocephalic and atraumatic.  Mouth/Throat: Oropharynx is clear and moist.  Eyes: Conjunctivae and EOM are normal.  Neck: Normal range of motion. Neck supple. No JVD present. No thyromegaly present.  Cardiovascular: Normal rate, regular rhythm and normal heart sounds.   Pulmonary/Chest: Effort normal and breath sounds normal. He has no wheezes.  He has no rales.  Abdominal: Soft. Bowel sounds are normal. He exhibits no mass. There is no tenderness. There is no rebound.  Musculoskeletal: Normal range of motion. He exhibits tenderness. He exhibits no edema.       Feet:  Neurological: He is alert and oriented to person, place, and time.  Skin: Skin is warm and dry.  Psychiatric: He  has a normal mood and affect.       Assessment & Plan:   Type II or unspecified type diabetes mellitus with renal manifestations, not stated as uncontrolled Managed historically with good control on minimal medications.  mild CKD, mild microalbuminuria, on ACE inhibitor.  LDL previously at goal on lipitor,  foot exam is normal except for current gout flare.  Repeat labs due in two weeks .  Nathaniel Lane is due to  gout flare so uric acid level can be checked as well.   CAD (coronary artery disease), autologous vein bypass graft He has been asymptomatic for years.  Followus up annually with Dr. Juliann Pares.  Contineu ACE INhibitor, asa ,  Statin .  Will consider adding beta blocker at next visit given elevated pulse today.  Gout attack Will prescribe ibuprofen 800 mg tid prn.  Uric acid level needs to be checked when flare resolves.  Will adjust allopurinol dose for goal < 6.0  Hypertension Well controlled on current regimen. Will initiate  beta blocker at next visit     Updated Medication List Outpatient Encounter Prescriptions as of 07/11/2011  Medication Sig Dispense Refill  . allopurinol (ZYLOPRIM) 100 MG tablet Take 1 tablet (100 mg total) by mouth daily.  90 tablet  3  . aspirin 81 MG tablet Take 81 mg by mouth daily.        Marland Kitchen atorvastatin (LIPITOR) 40 MG tablet Take 1 tablet (40 mg total) by mouth at bedtime.  90 tablet  3  . enalapril-hydrochlorothiazide (VASERETIC) 10-25 MG per tablet Take 1 tablet by mouth at bedtime.  90 tablet  3  . metFORMIN (GLUCOPHAGE) 500 MG tablet Take 1 tablet (500 mg total) by mouth 2 (two) times daily with a meal.  180 tablet  3  . niacin (NIASPAN) 1000 MG CR tablet Take 1 tablet (1,000 mg total) by mouth at bedtime.  90 tablet  3  . Tamsulosin HCl (FLOMAX) 0.4 MG CAPS Take 1 capsule (0.4 mg total) by mouth daily.  90 capsule  3  . ibuprofen (ADVIL,MOTRIN) 800 MG tablet Take 1 tablet (800 mg total) by mouth every 8 (eight) hours as needed for pain.  30 tablet  3

## 2011-07-12 ENCOUNTER — Encounter: Payer: Self-pay | Admitting: Internal Medicine

## 2011-07-12 DIAGNOSIS — E1129 Type 2 diabetes mellitus with other diabetic kidney complication: Secondary | ICD-10-CM | POA: Insufficient documentation

## 2011-07-12 DIAGNOSIS — I25810 Atherosclerosis of coronary artery bypass graft(s) without angina pectoris: Secondary | ICD-10-CM | POA: Insufficient documentation

## 2011-07-12 DIAGNOSIS — M109 Gout, unspecified: Secondary | ICD-10-CM | POA: Insufficient documentation

## 2011-07-12 DIAGNOSIS — Z8701 Personal history of pneumonia (recurrent): Secondary | ICD-10-CM | POA: Insufficient documentation

## 2011-07-12 NOTE — Assessment & Plan Note (Signed)
Well controlled on current regimen. Will initiate  beta blocker at next visit

## 2011-07-12 NOTE — Assessment & Plan Note (Addendum)
He has been asymptomatic for years.  Followus up annually with Dr. Juliann Pares.  Contineu ACE INhibitor, asa ,  Statin .  Will consider adding beta blocker at next visit given elevated pulse today.

## 2011-07-12 NOTE — Assessment & Plan Note (Addendum)
Managed historically with good control on minimal medications.  mild CKD, mild microalbuminuria, on ACE inhibitor.  LDL previously at goal on lipitor,  foot exam is normal except for current gout flare.  Repeat labs due in two weeks .  Delay is due to  gout flare so uric acid level can be checked as well.

## 2011-07-12 NOTE — Assessment & Plan Note (Signed)
Will prescribe ibuprofen 800 mg tid prn.  Uric acid level needs to be checked when flare resolves.  Will adjust allopurinol dose for goal < 6.0

## 2011-08-08 ENCOUNTER — Other Ambulatory Visit (INDEPENDENT_AMBULATORY_CARE_PROVIDER_SITE_OTHER): Payer: BC Managed Care – PPO | Admitting: *Deleted

## 2011-08-08 DIAGNOSIS — Z79899 Other long term (current) drug therapy: Secondary | ICD-10-CM

## 2011-08-08 DIAGNOSIS — E785 Hyperlipidemia, unspecified: Secondary | ICD-10-CM

## 2011-08-08 DIAGNOSIS — M109 Gout, unspecified: Secondary | ICD-10-CM

## 2011-08-08 DIAGNOSIS — E119 Type 2 diabetes mellitus without complications: Secondary | ICD-10-CM

## 2011-08-08 LAB — COMPREHENSIVE METABOLIC PANEL
Albumin: 3.4 g/dL — ABNORMAL LOW (ref 3.5–5.2)
Alkaline Phosphatase: 144 U/L — ABNORMAL HIGH (ref 39–117)
BUN: 28 mg/dL — ABNORMAL HIGH (ref 6–23)
CO2: 26 mEq/L (ref 19–32)
GFR: 51.31 mL/min — ABNORMAL LOW (ref >60.00)
Glucose, Bld: 174 mg/dL — ABNORMAL HIGH (ref 70–99)
Potassium: 5.3 mEq/L — ABNORMAL HIGH (ref 3.5–5.1)
Total Protein: 7.6 g/dL (ref 6.0–8.3)

## 2011-08-08 LAB — LIPID PANEL
Cholesterol: 87 mg/dL (ref 0–200)
LDL Cholesterol: 39 mg/dL (ref 0–99)
Triglycerides: 72 mg/dL (ref 0.0–149.0)

## 2011-08-08 LAB — URIC ACID: Uric Acid, Serum: 7.7 mg/dL (ref 4.0–7.8)

## 2011-08-08 MED ORDER — ALLOPURINOL 100 MG PO TABS: 200.0000 mg | ORAL_TABLET | Freq: Every day | ORAL | Status: DC

## 2011-08-08 MED ORDER — ATORVASTATIN CALCIUM 20 MG PO TABS: 20.0000 mg | ORAL_TABLET | Freq: Every day | ORAL | Status: DC

## 2011-08-08 NOTE — Progress Notes (Signed)
Addended by: Duncan Dull on: 08/08/2011 05:13 PM   Modules accepted: Orders, Medications

## 2011-08-15 ENCOUNTER — Other Ambulatory Visit (INDEPENDENT_AMBULATORY_CARE_PROVIDER_SITE_OTHER): Payer: BC Managed Care – PPO | Admitting: *Deleted

## 2011-08-15 DIAGNOSIS — E875 Hyperkalemia: Secondary | ICD-10-CM

## 2011-08-16 LAB — BASIC METABOLIC PANEL
CO2: 27 mEq/L (ref 19–32)
Chloride: 98 mEq/L (ref 96–112)
Creatinine, Ser: 1.2 mg/dL (ref 0.4–1.5)
Potassium: 4.5 mEq/L (ref 3.5–5.1)

## 2011-09-10 ENCOUNTER — Emergency Department: Payer: Self-pay | Admitting: Emergency Medicine

## 2011-09-10 ENCOUNTER — Telehealth: Payer: Self-pay | Admitting: *Deleted

## 2011-09-10 IMAGING — CT CT CHEST W/O CM
1 series · 15 of 33 positions shown, 19 images · non-contrast
Comparison: None

REASON FOR EXAM: hemoptysis
COMMENTS:

PROCEDURE:     CT  - CT CHEST WITHOUT CONTRAST  - [DATE]  [DATE]
RESULT:     Indication: Hemoptysis
TECHNIQUE: Multiple axial images of the chest are obtained without
intravenous contrast.

[Series 2: soft tissue · axial · 0.71mm/px · z∈[-162,+128]mm · 15 of 70 slices shown, 19 images]
[im 6/70  mediastinal]
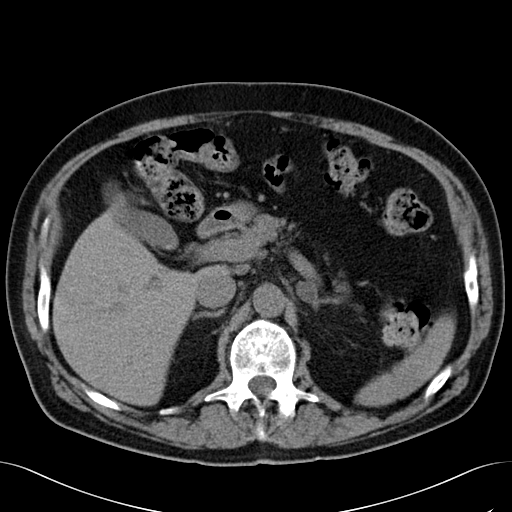
[im 6/70  lung]
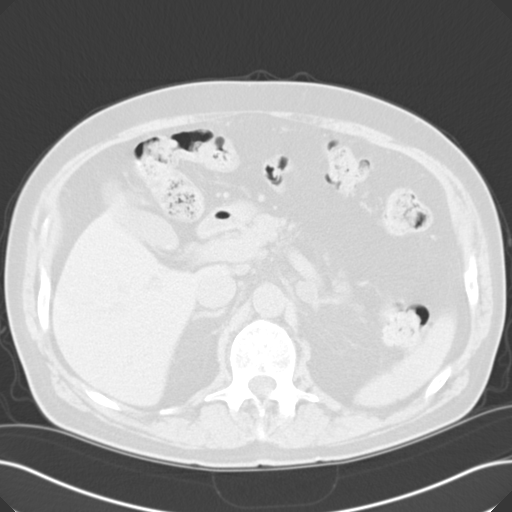
[im 11/70  lung]
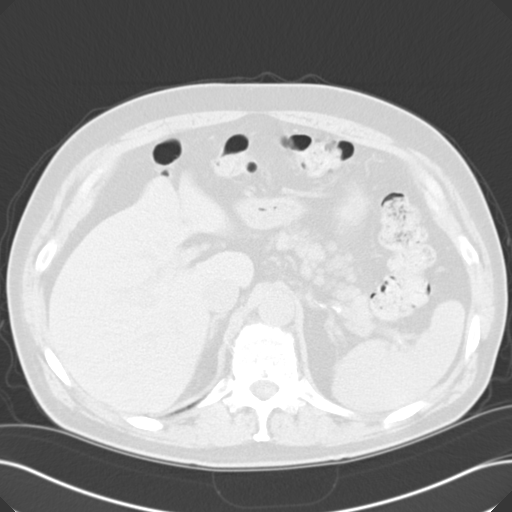
[im 14/70  lung]
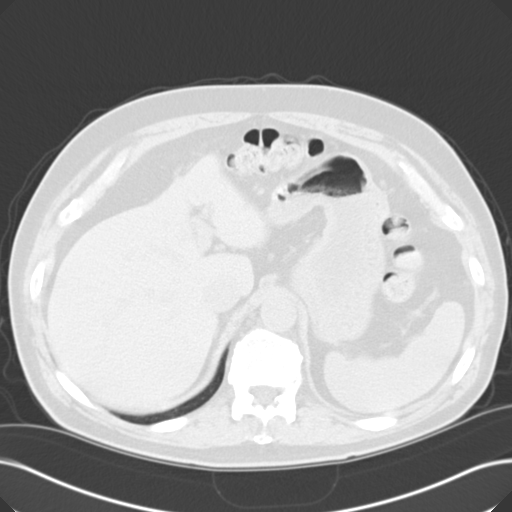
[im 18/70  lung]
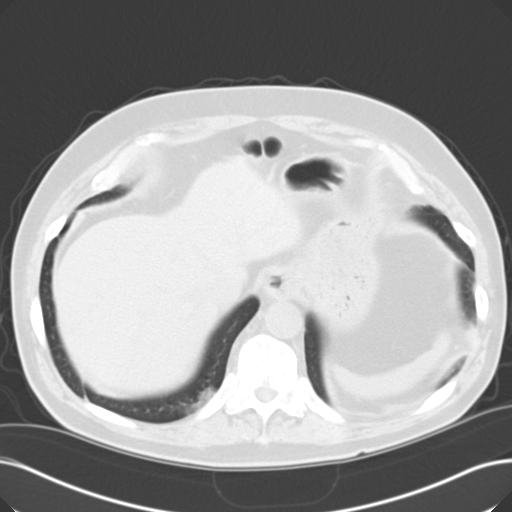
[im 24/70  mediastinal]
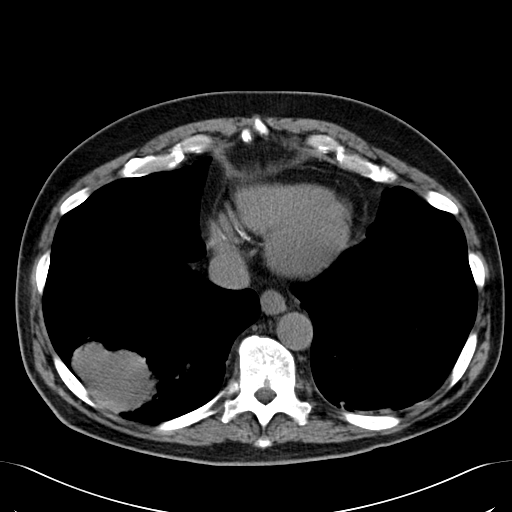
[im 24/70  lung]
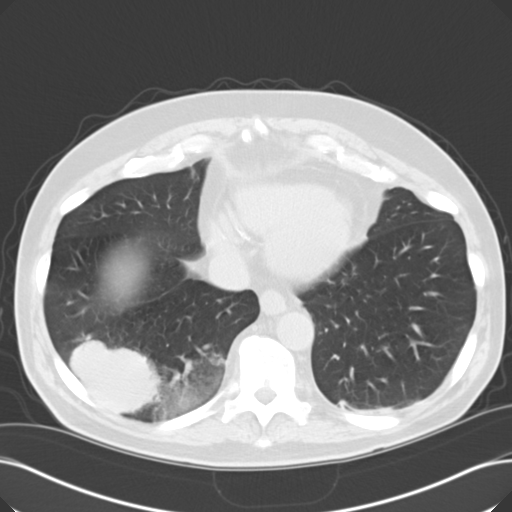
[im 28/70  lung]
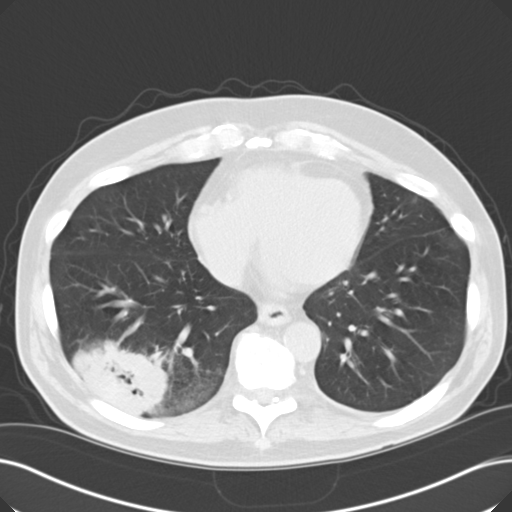
[im 31/70  lung]
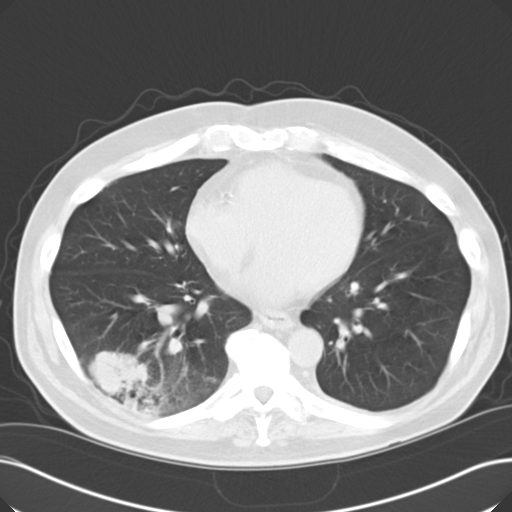
[im 36/70  lung]
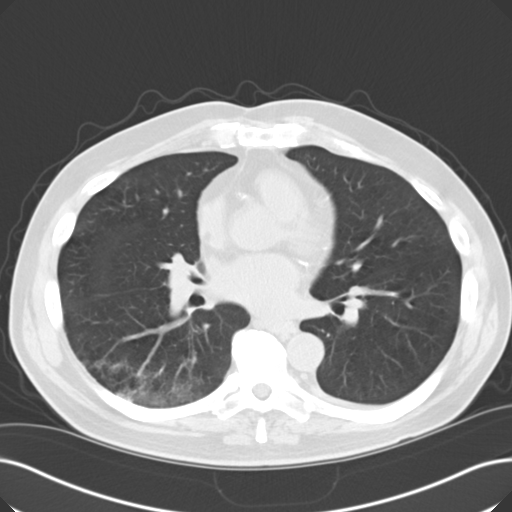
[im 39/70  mediastinal]
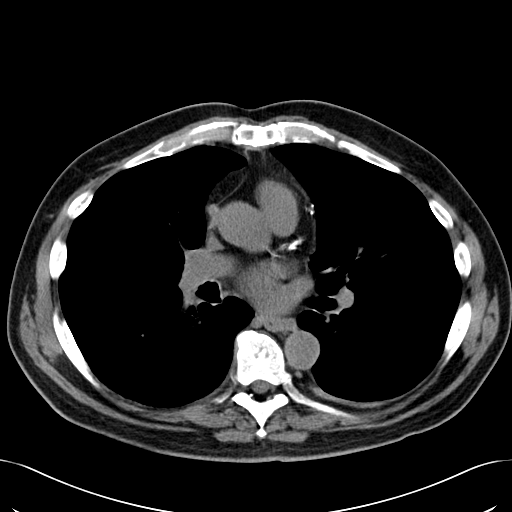
[im 39/70  lung]
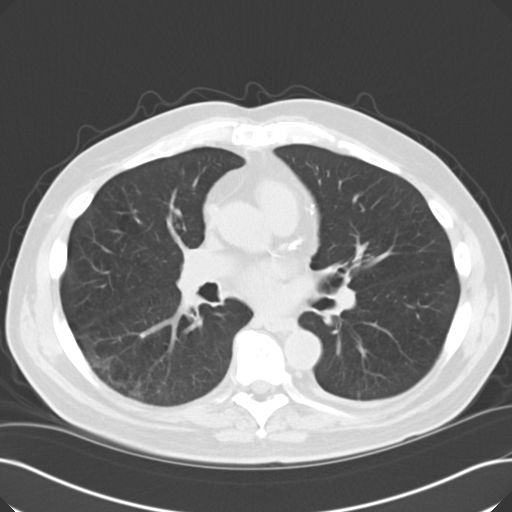
[im 42/70  lung]
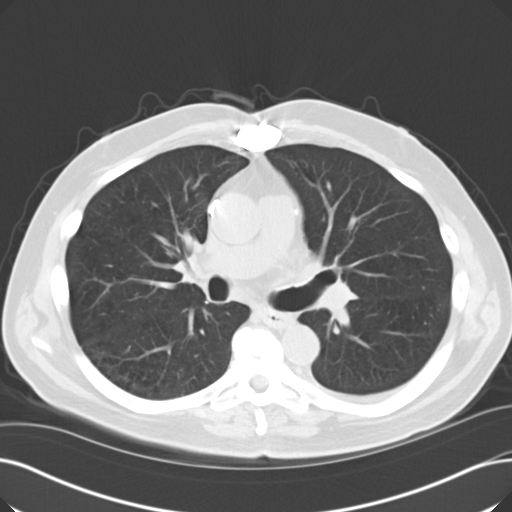
[im 47/70  lung]
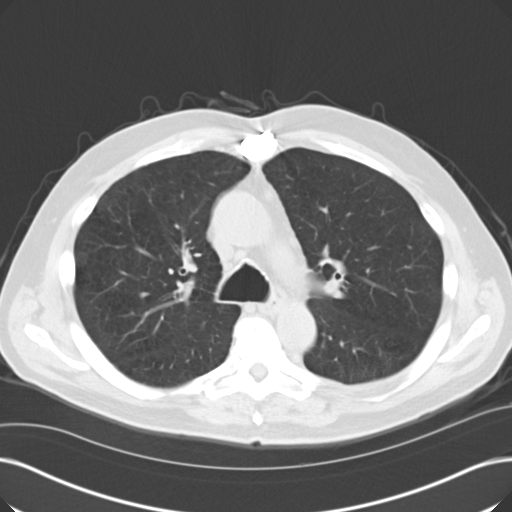
[im 52/70  lung]
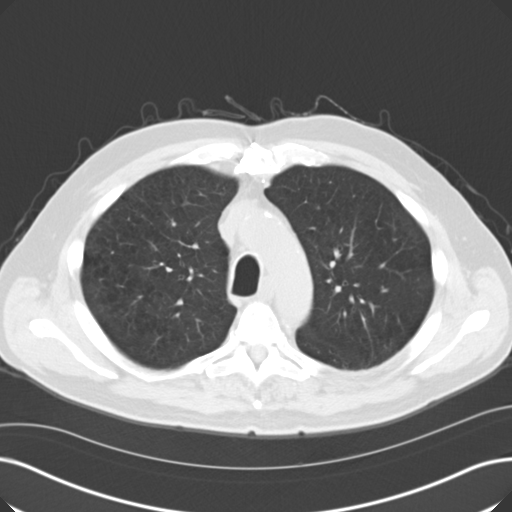
[im 56/70  mediastinal]
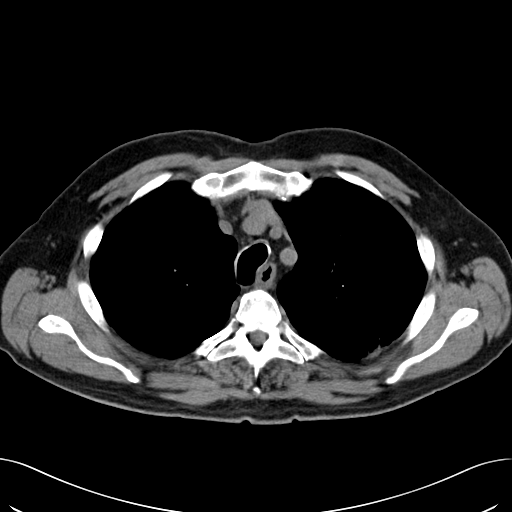
[im 56/70  lung]
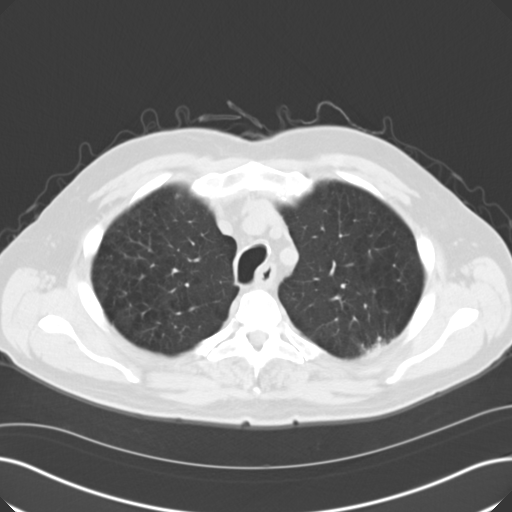
[im 59/70  lung]
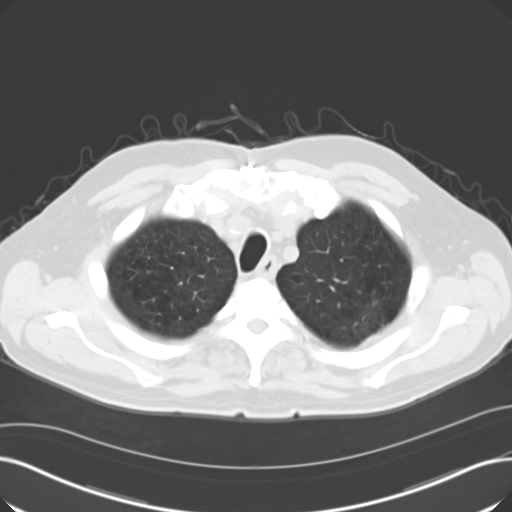
[im 64/70  lung]
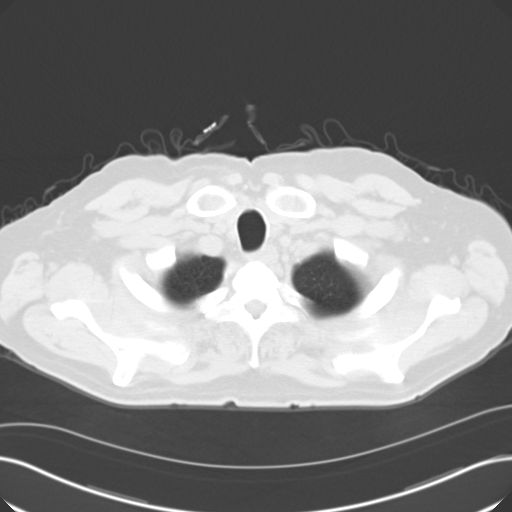

[15 of 33 positions shown; findings below may reference images not displayed]

FINDINGS: The central airways are patent. There bilateral emphysematous changes. There
is a 7.4 x 4.4 cm right lower lobe mass with surrounding groundglass
opacities. There is no pleural effusion or pneumothorax.

There are no pathologically enlarged axillary, hilar, or mediastinal lymph
nodes.

The heart size is normal. There is no pericardial effusion. The thoracic
aorta is normal in caliber.  There is evidence of prior CABG.

Review of bone windows demonstrates no focal lytic or sclerotic lesions.

Limited noncontrast images of the upper abdomen were obtained. The adrenal
glands appear normal. The remainder of the visualized abdominal organs are
unremarkable.
IMPRESSION: 1. There is a 7.4 x 4.4 cm right lower lobe mass most concerning for
malignancy. There is surrounding groundglass opacity concerning for
alveolitis versus hemorrhage.

## 2011-09-10 IMAGING — CR DG CHEST 2V
1 series · 2 of 2 positions shown · non-contrast
Comparison: none

REASON FOR EXAM: blood in sputum
COMMENTS:   May transport without cardiac monitor

[Series 1: pa · 0.17mm/px · 2 of 2 slices shown]
[im 1/2]
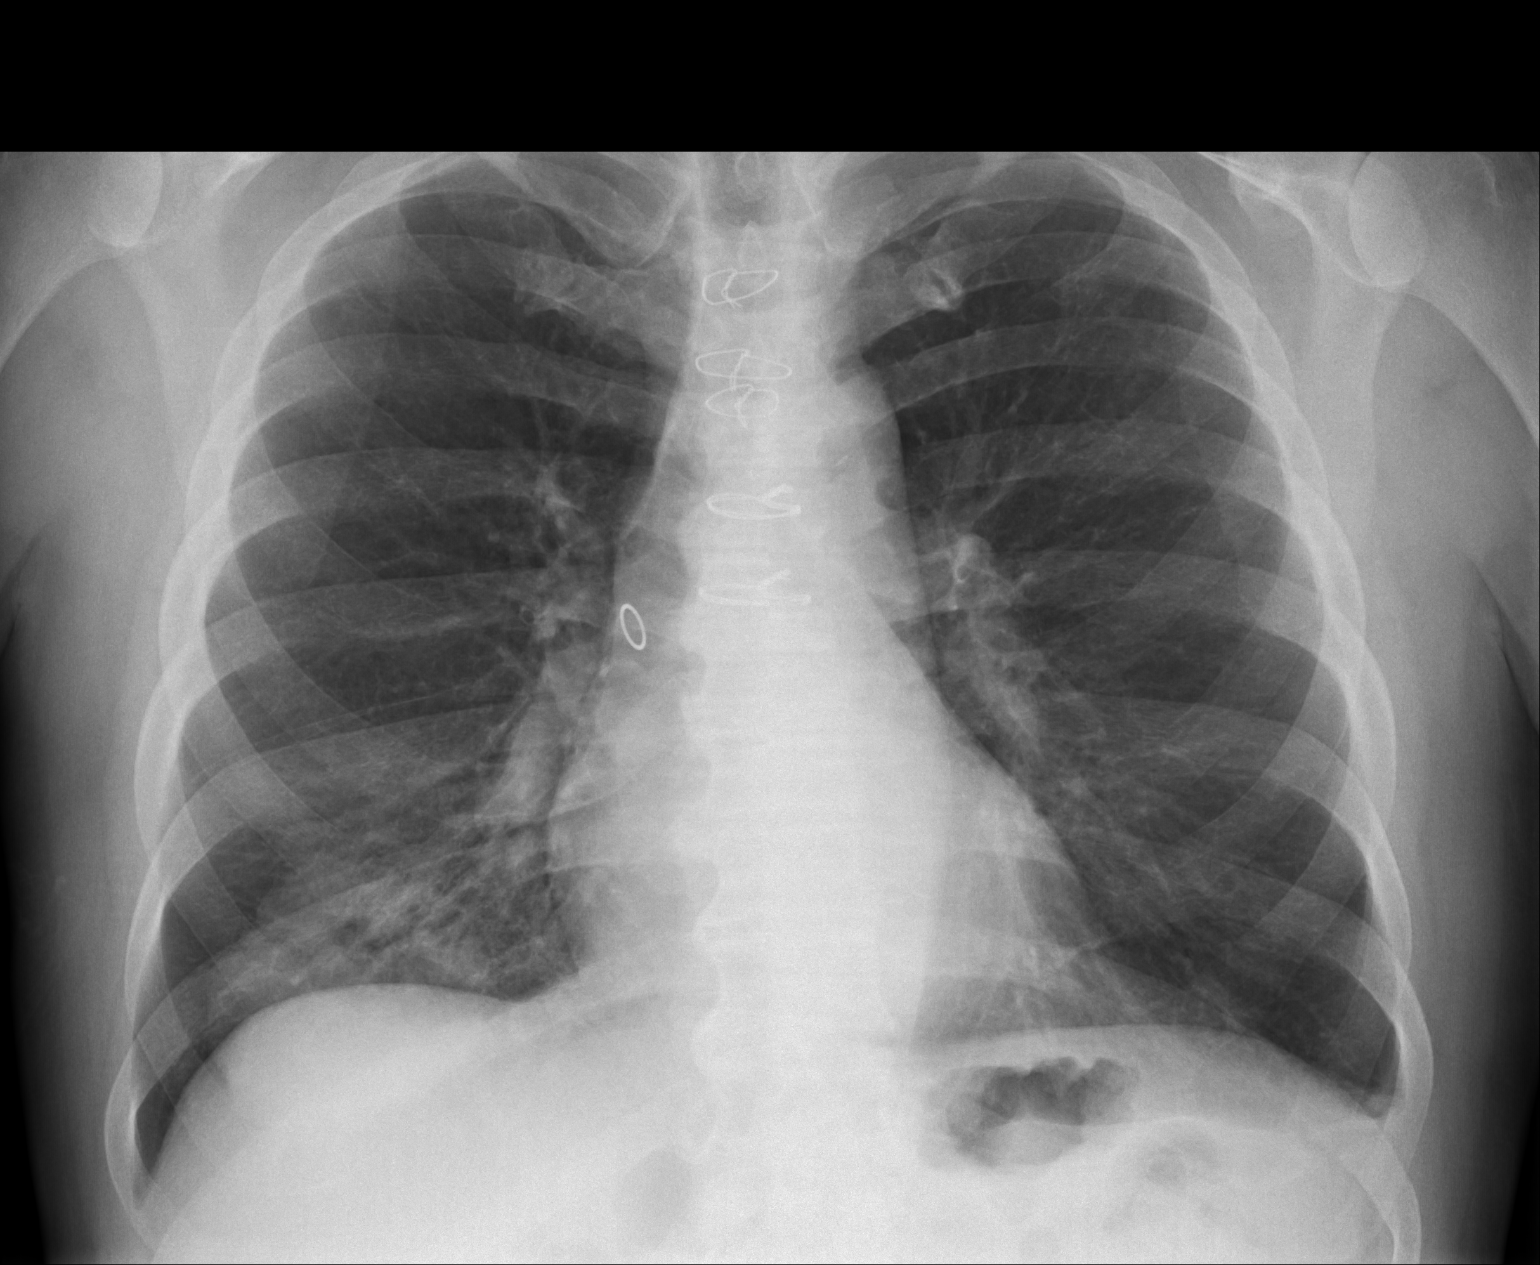
[im 2/2]
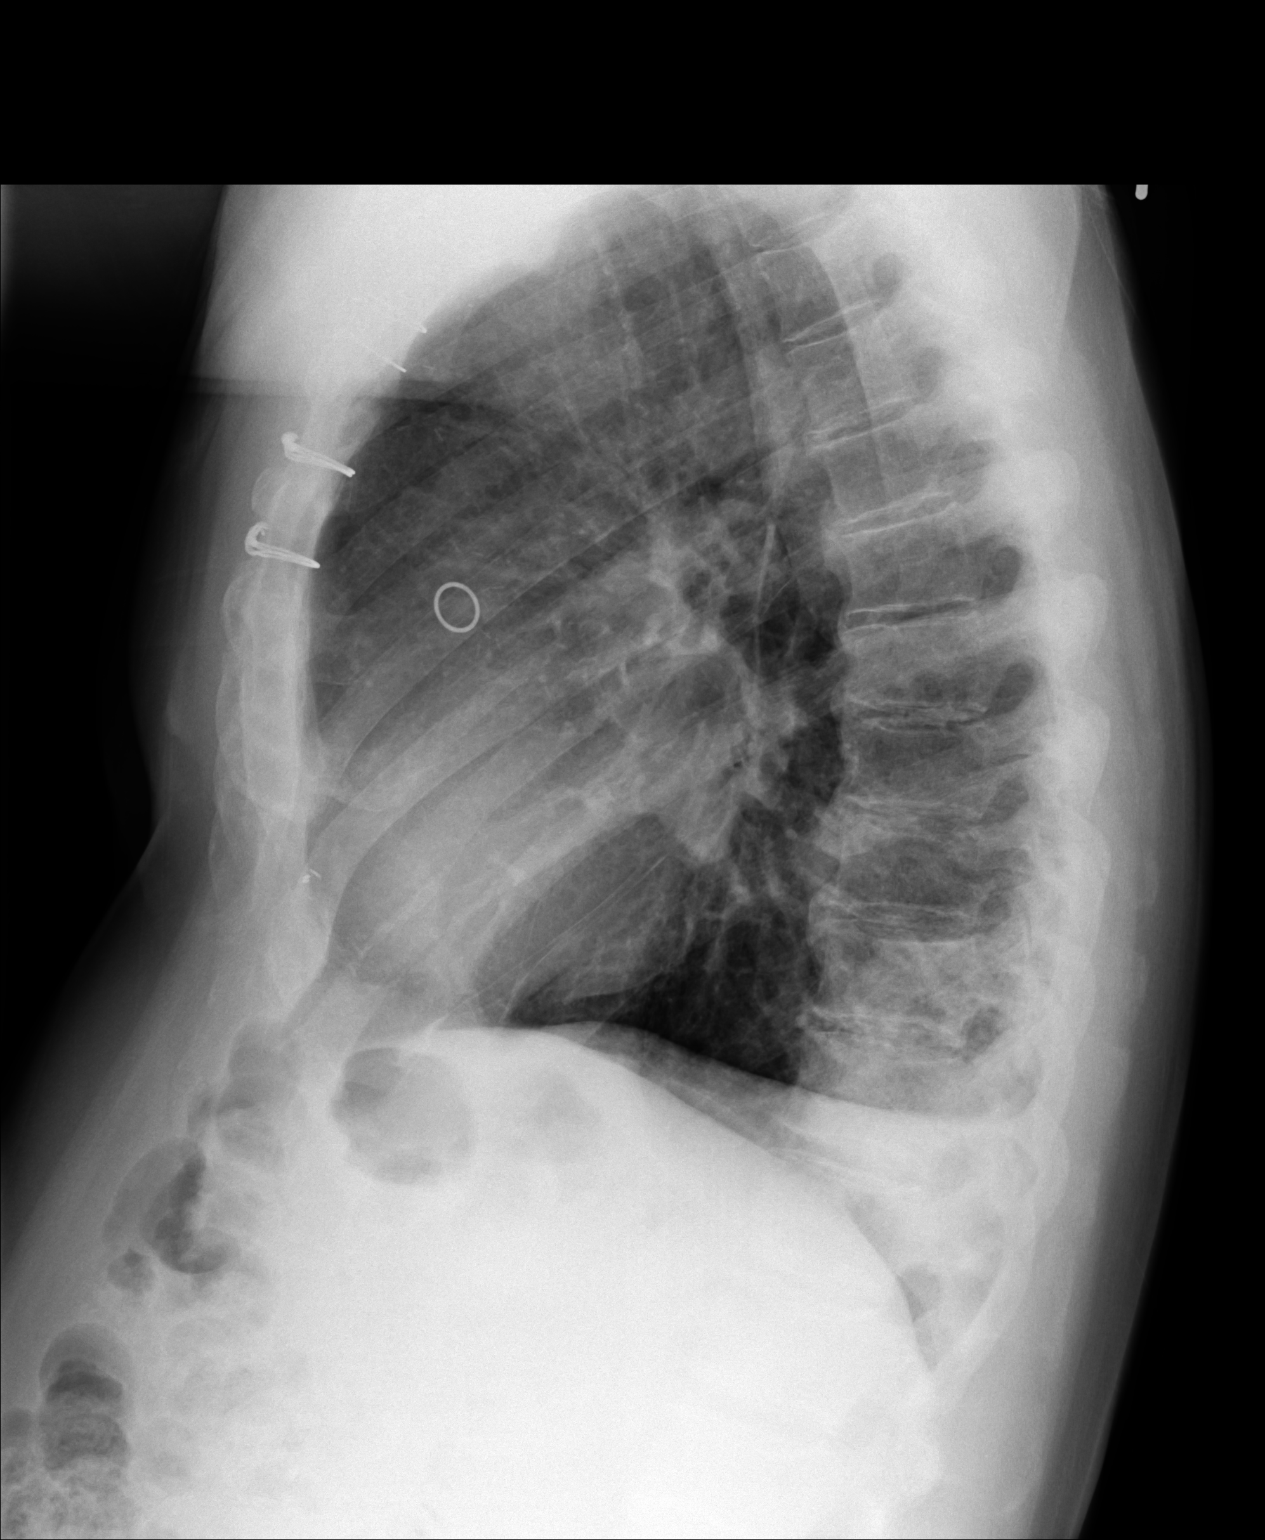

[2 of 2 positions shown; findings below may reference images not displayed]

PROCEDURE:     DXR - DXR CHEST PA (OR AP) AND LATERAL  - [DATE]  [DATE]

RESULT:     Comparison is made with previous examination dated [DATE].

There is increasing density at the right lung base predominantly in the
right middle lobe consistent with pneumonia. Right lower lobe involvement is
present as well. CABG changes are present. The lungs show mild
hyperinflation. Minimal blunting of the left costophrenic angle appears
unchanged.
IMPRESSION: 1. Right lung base pneumonia. COPD.
2. Sternotomy changes with CABG markers present.
3. Probable chronic blunting of the left costophrenic angle. Small
persistent left pleural effusion is not excluded followup is recommended to
document right lung base clearing.

## 2011-09-10 NOTE — Telephone Encounter (Signed)
[phone] f. [phone] To: [facility] (Daytime Triage) Fax: [phone] From: [facility] Date/ Time: 09/10/2011 3:33 PM Taken By: Elpidio Eric, RN Caller: Lupita Leash Facility: not collected Patient: Nathaniel Lane DOB: Jan 03, 1946 Phone: [phone] Reason for Call: Spouse states that she will instruct pt to go to ED as I advised and "See what he says. " Spoke with Ashely, states that there are no more appts this afternoon and ok's me to send pt ot ED. Regarding Appointment: Appt Date: Appt Time: Unknown Provider: Reason: Details: Outcome:

## 2011-09-10 NOTE — Telephone Encounter (Signed)
Triage Record Num: [id number] Operator: Amy Head Patient Name: Nathaniel Lane Call Date & Time: 09/10/2011 3:16:27PM Patient Phone: [phone] PCP: Duncan Dull Patient Gender: Male PCP Fax : [phone] Patient DOB: March 26, 1946 Practice Name: [facility] Reason for Call: Caller: Donna/Spouse; PCP: Duncan Dull; CB#: [phone]; ; ; Call regarding Cough/Congestion; Onset in Feb, when pt was seen in office. Was not dx with any illness at that time. Is coughing up blood. Afebrile. Has been coughing up blood for 1 month and a half. Has shortness of breath at times as well as dizziness at times. Advised ED per guidelines. Protocol(s) Used: Cough - Adult Recommended Outcome per Protocol: See ED Immediately Reason for Outcome: Sudden onset of shortness of breath, difficulty breathing, chest pain OR cough with blood tinged sputum. Care Advice: ~ Another adult should drive. ~ Do not give the patient anything to eat or drink. If home oxygen has been prescribed, apply it at the recommended flow rate; DO NOT increase the flow rate before consulting provider. ~ Call EMS 911 if signs and symptoms of shock develop (such as unable to stand due to faintness, dizziness, or lightheadedness; new onset of confusion; slow to respond or difficult to awaken; skin is pale, gray, cool, or moist to touch; severe weakness; loss of consciousness). ~ ~ Do not massage affected area if red, swollen, warm, or tender to touch OR if history of blood clots. ~ IMMEDIATE ACTION Write down provider's name. List or place the following in a bag for transport with the patient: current prescription and/or nonprescription medications; alternative treatments, therapies and medications; and street drugs. ~ ~ Place person in a position of comfort and loosen tight clothing. 03/

## 2011-09-11 ENCOUNTER — Telehealth: Payer: Self-pay | Admitting: Internal Medicine

## 2011-09-11 DIAGNOSIS — C3490 Malignant neoplasm of unspecified part of unspecified bronchus or lung: Secondary | ICD-10-CM | POA: Insufficient documentation

## 2011-09-11 DIAGNOSIS — R918 Other nonspecific abnormal finding of lung field: Secondary | ICD-10-CM

## 2011-09-11 DIAGNOSIS — R042 Hemoptysis: Secondary | ICD-10-CM | POA: Insufficient documentation

## 2011-09-11 NOTE — Assessment & Plan Note (Signed)
Sent to ER on March 18,  CT chest showed emphsema and RLL mass 7 cmwith surrounding alveoliits.  Referred to [facility]

## 2011-09-11 NOTE — Assessment & Plan Note (Signed)
7 cm, found on chest CT done by ER for hemoptysis march 18th . [facility] referral made, Christian Mate 09/12/11

## 2011-09-11 NOTE — Telephone Encounter (Signed)
Patient was spitting up blood for about 3 weeks and the nurse instructed him to go to the ED and take x-rays and he did and they took a CT scan and Dr. Mayford Knife at the hospital told him to make an appointment to see Dr. Orlie Dakin on 3.20.13 @ 3:00.

## 2011-09-11 NOTE — Telephone Encounter (Signed)
Please send copy of much discussed last office visit (the one that was pirnted out 3 times in an effort to save paper) to Dr Orlie Dakin at [facility]

## 2011-09-11 NOTE — Telephone Encounter (Signed)
Copy has been E-faxed to Dr Orlie Dakin

## 2011-09-12 ENCOUNTER — Ambulatory Visit: Payer: Self-pay | Admitting: Internal Medicine

## 2011-09-12 LAB — COMPREHENSIVE METABOLIC PANEL
Alkaline Phosphatase: 210 U/L — ABNORMAL HIGH (ref 50–136)
Anion Gap: 9 (ref 7–16)
BUN: 19 mg/dL — ABNORMAL HIGH (ref 7–18)
Bilirubin,Total: 0.4 mg/dL (ref 0.2–1.0)
Calcium, Total: 9.1 mg/dL (ref 8.5–10.1)
Chloride: 100 mmol/L (ref 98–107)
Co2: 29 mmol/L (ref 21–32)
EGFR (Non-African Amer.): 53 — ABNORMAL LOW
Osmolality: 279 (ref 275–301)
Potassium: 4.3 mmol/L (ref 3.5–5.1)
Sodium: 138 mmol/L (ref 136–145)
Total Protein: 7.7 g/dL (ref 6.4–8.2)

## 2011-09-12 LAB — CBC CANCER CENTER
Basophil #: 0 x10 3/mm (ref 0.0–0.1)
HGB: 12.3 g/dL — ABNORMAL LOW (ref 13.0–18.0)
Lymphocyte #: 2.3 x10 3/mm (ref 1.0–3.6)
MCH: 26.8 pg (ref 26.0–34.0)
MCV: 84.4 fL (ref 80–100)
Monocyte #: 1 x10 3/mm — ABNORMAL HIGH (ref 0.0–0.7)
Monocyte %: 10.2 %
Neutrophil %: 64.1 %
Platelet: 227 x10 3/mm (ref 150–440)
RBC: 4.59 10*6/uL (ref 4.40–5.90)
RDW: 14.9 % — ABNORMAL HIGH (ref 11.5–14.5)
WBC: 10 x10 3/mm (ref 3.8–10.6)

## 2011-09-12 LAB — PROTIME-INR: INR: 1.1

## 2011-09-19 ENCOUNTER — Ambulatory Visit: Payer: Self-pay | Admitting: Internal Medicine

## 2011-09-19 IMAGING — CT NM PET TUM IMG INITIAL (PI) SKULL BASE T - THIGH
6 series · 24 of 25 positions shown · non-contrast
Comparison: none

REASON FOR EXAM: rt lower lobe lung  mass  chemo ptysis  WITH
BRONCHUDILATERS AND ABG
COMMENTS:

[Series 3: ct wb 3.0 b30f · axial · 3.0mm · 0.98mm/px · z∈[-1518,-532]mm · 10 of 494 slices shown]
[im 1/494]
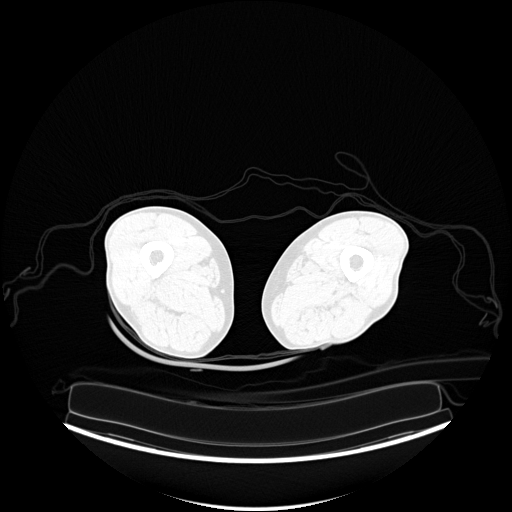
[im 55/494]
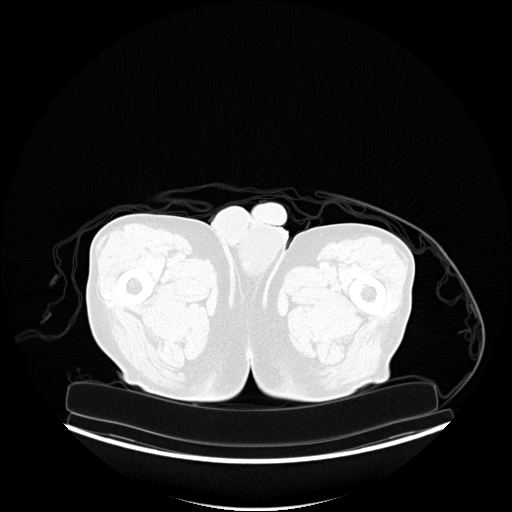
[im 110/494]
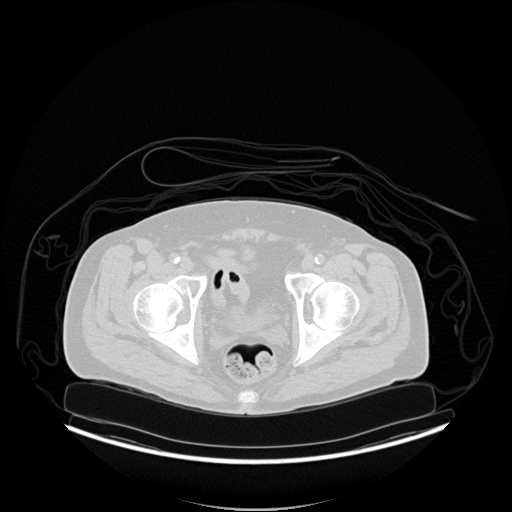
[im 165/494]
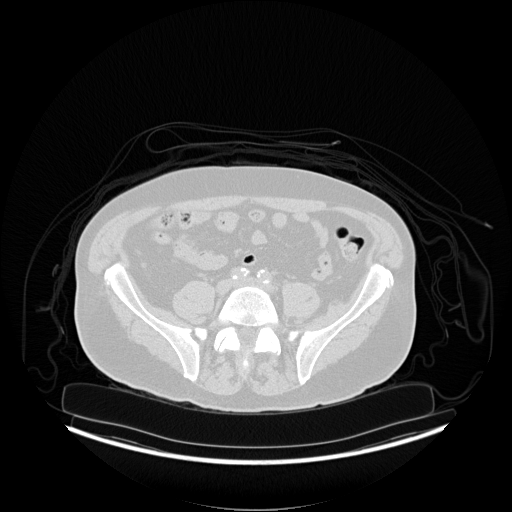
[im 220/494]
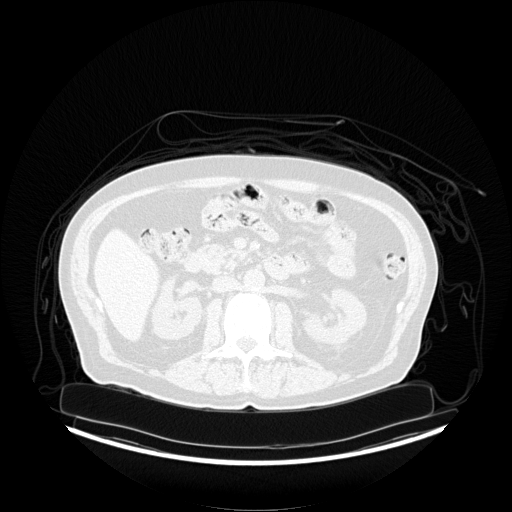
[im 274/494]
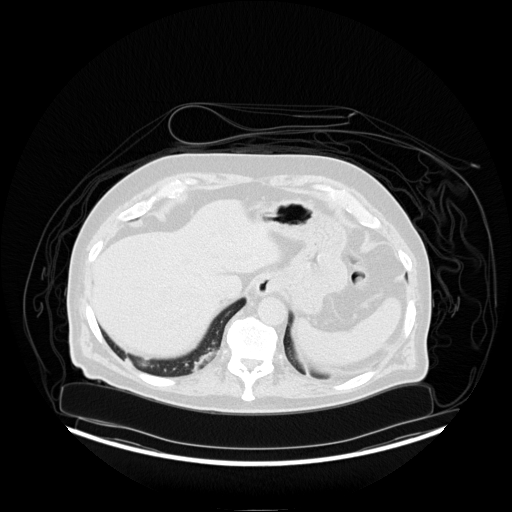
[im 329/494]
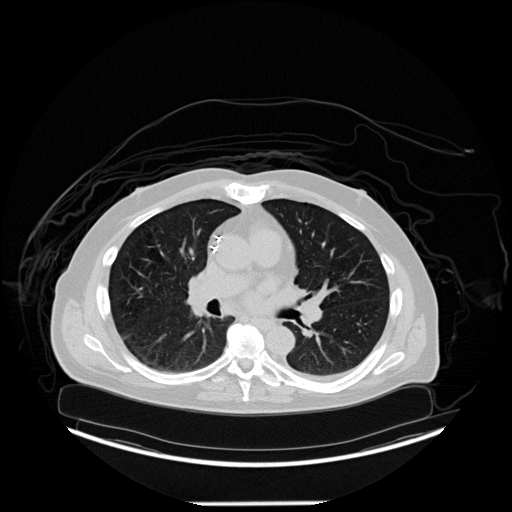
[im 384/494]
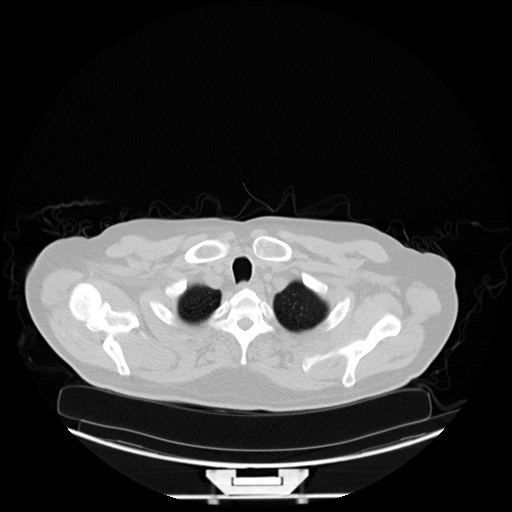
[im 439/494]
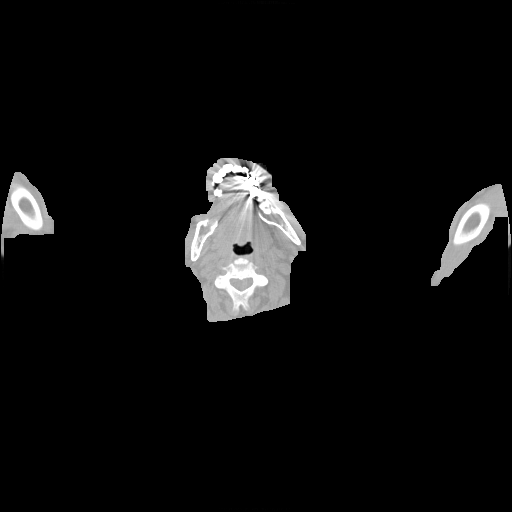
[im 494/494  brain]
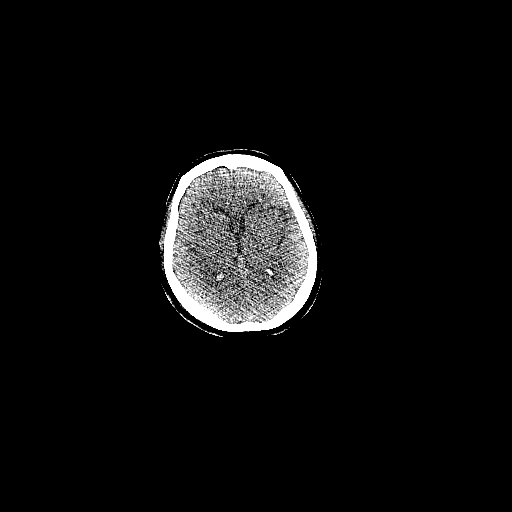

[Series 102: pet wb · axial · 5.0mm · 4.07mm/px · z∈[-1516,-532]mm · 6 of 329 slices shown]
[im 1/329]
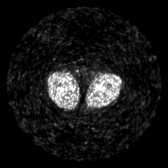
[im 55/329]
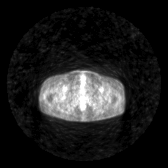
[im 165/329]
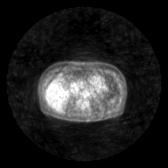
[im 219/329]
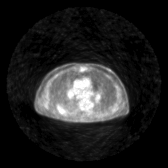
[im 274/329]
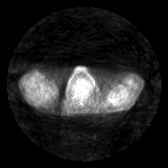
[im 329/329]
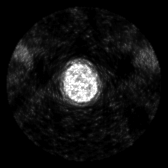

[Series 803: pet axial · 4 of 194 slices shown]
[im 1/194]
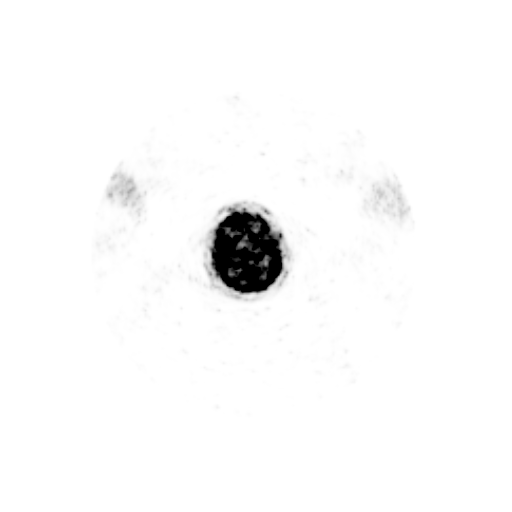
[im 65/194]
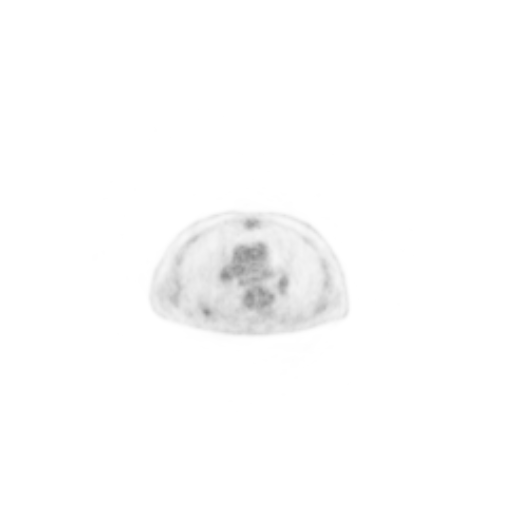
[im 129/194]
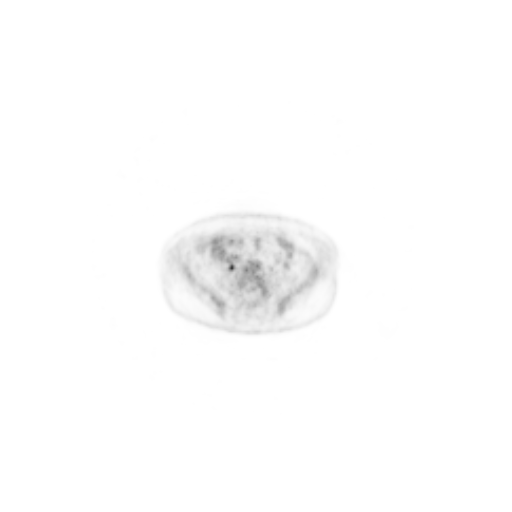
[im 194/194]
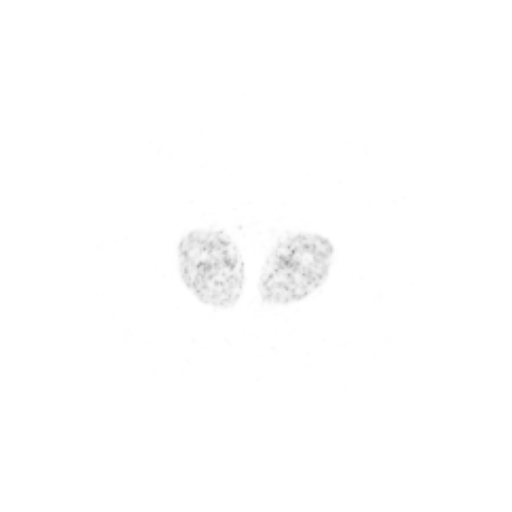

[Series 804: pet coronal · 1 of 68 slices shown]
[im 1/68]
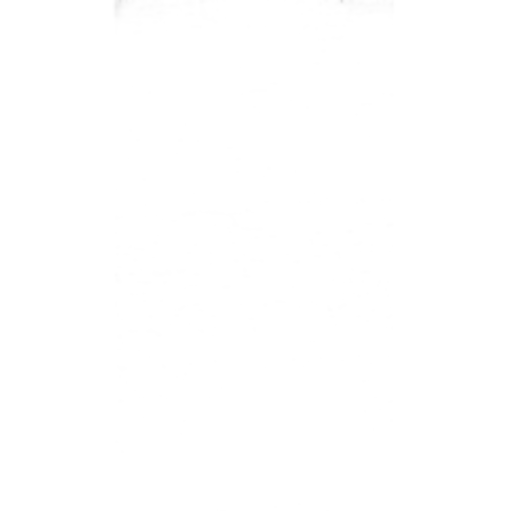

[Series 805: pet sagittal · 2 of 110 slices shown]
[im 1/110]
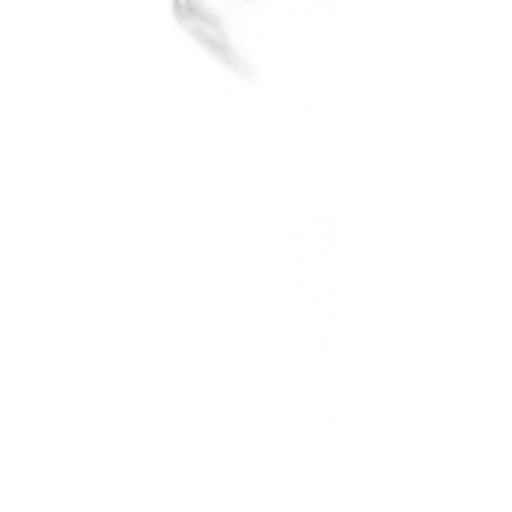
[im 110/110]
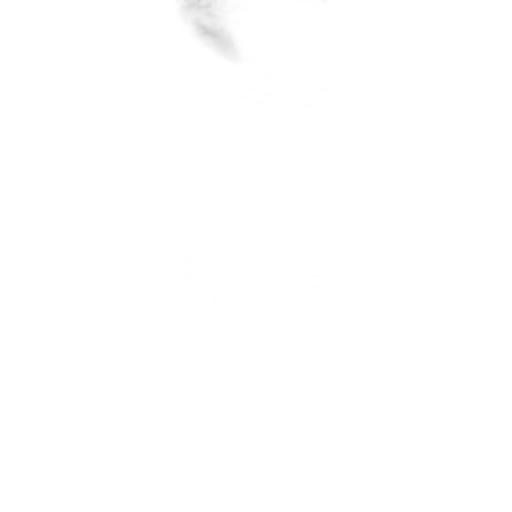

[Series 1018: results mm oncology reading · 1.31mm/px · 1 of 4 slices shown]
[im 1/4]
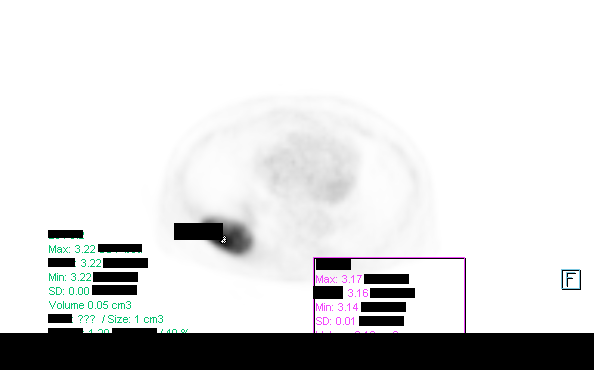

[24 of 25 positions shown; findings below may reference images not displayed]

PROCEDURE:     PET - PET/CT INIT STAGING LUNG CA  - [DATE]  [DATE]

RESULT:     The patient is undergoing initial staging of lung malignancy.
The patient's fasting blood glucose level was 121 mg/dL the patient received
12.45 mCi of F-18 labeled FDG at [DATE] p.m. with scanning beginning at [DATE]
p.m. A noncontrast CT scan was performed at the same sitting for
coregistration and attenuation correction.

Within the lower neck there is a focus of increased uptake on the left
centered in the lateral aspect of the left thyroid lobe. This is associated
with an area of calcification. The SUV measures 1.9 with a mean of 1.8.
Within the thorax there is the known abnormal mass posteriorly in the right
lower lobe. It exhibits a maximal SUV by 2.7 with a mean of 2.5 There is a
small amount of air visible within this mass on the CT images. I do not see
abnormal uptake within the mediastinum or hilar regions. There is a small
amount of pleural based increased density posteriorly in the left upper lobe
which is nonspecific.

Within the abdomen and pelvis no abnormal uptake is demonstrated. The CT
appearance of the liver and spleen and adrenal glands is within the limits
of normal. There are tiny calcifications in the dependent portion the
gallbladder suggesting stones. There is no evidence of obstruction of either
kidney. The caliber of the abdominal aorta is normal. There is a retroaortic
left renal vein. There is no evidence of ascites.
IMPRESSION: 1. There is abnormal uptake in the known pulmonary parenchymal mass like
lesion in the posterior aspect of the right lower lobe. There are air
bronchograms within this lesion. This could reflect a cavitary malignancy
but infectious or inflammatory processes are possible as well.
2. There is abnormal localization in the abnormal uptake of the left thyroid
lobe associated with some calcification. Further evaluation of the thyroid
gland with ultrasound and nuclear scanning would be useful.
3. There are calcifications in the dependent portion of the of the
gallbladder consistent with stones.

## 2011-09-24 ENCOUNTER — Ambulatory Visit: Payer: Self-pay | Admitting: Internal Medicine

## 2011-09-26 ENCOUNTER — Ambulatory Visit: Payer: Self-pay | Admitting: Internal Medicine

## 2011-09-27 LAB — COMPREHENSIVE METABOLIC PANEL
Albumin: 3 g/dL — ABNORMAL LOW (ref 3.4–5.0)
Alkaline Phosphatase: 163 U/L — ABNORMAL HIGH (ref 50–136)
Anion Gap: 12 (ref 7–16)
BUN: 27 mg/dL — ABNORMAL HIGH (ref 7–18)
Calcium, Total: 8.7 mg/dL (ref 8.5–10.1)
Creatinine: 1.58 mg/dL — ABNORMAL HIGH (ref 0.60–1.30)
EGFR (Non-African Amer.): 47 — ABNORMAL LOW
Glucose: 275 mg/dL — ABNORMAL HIGH (ref 65–99)
Potassium: 4 mmol/L (ref 3.5–5.1)
SGPT (ALT): 38 U/L
Sodium: 133 mmol/L — ABNORMAL LOW (ref 136–145)
Total Protein: 7.9 g/dL (ref 6.4–8.2)

## 2011-09-27 LAB — APTT: Activated PTT: 35.3 secs (ref 23.6–35.9)

## 2011-09-27 LAB — CBC CANCER CENTER
Basophil #: 0 x10 3/mm (ref 0.0–0.1)
Basophil %: 0.3 %
Eosinophil #: 0 x10 3/mm (ref 0.0–0.7)
Eosinophil %: 0.4 %
Lymphocyte #: 1.4 x10 3/mm (ref 1.0–3.6)
Lymphocyte %: 11.3 %
MCV: 82 fL (ref 80–100)
Monocyte #: 1.1 x10 3/mm — ABNORMAL HIGH (ref 0.0–0.7)
Monocyte %: 8.8 %
Platelet: 235 x10 3/mm (ref 150–440)
RBC: 4.52 10*6/uL (ref 4.40–5.90)
WBC: 12.6 x10 3/mm — ABNORMAL HIGH (ref 3.8–10.6)

## 2011-09-27 LAB — PROTIME-INR: INR: 1.2

## 2011-09-27 IMAGING — CR DG CHEST 2V
1 series · 2 of 2 positions shown · non-contrast
Comparison: none

REASON FOR EXAM: cxr respiratory workup
COMMENTS:

[Series 1: w chest pa · 0.14mm/px · 2 of 2 slices shown]
[im 1/2]
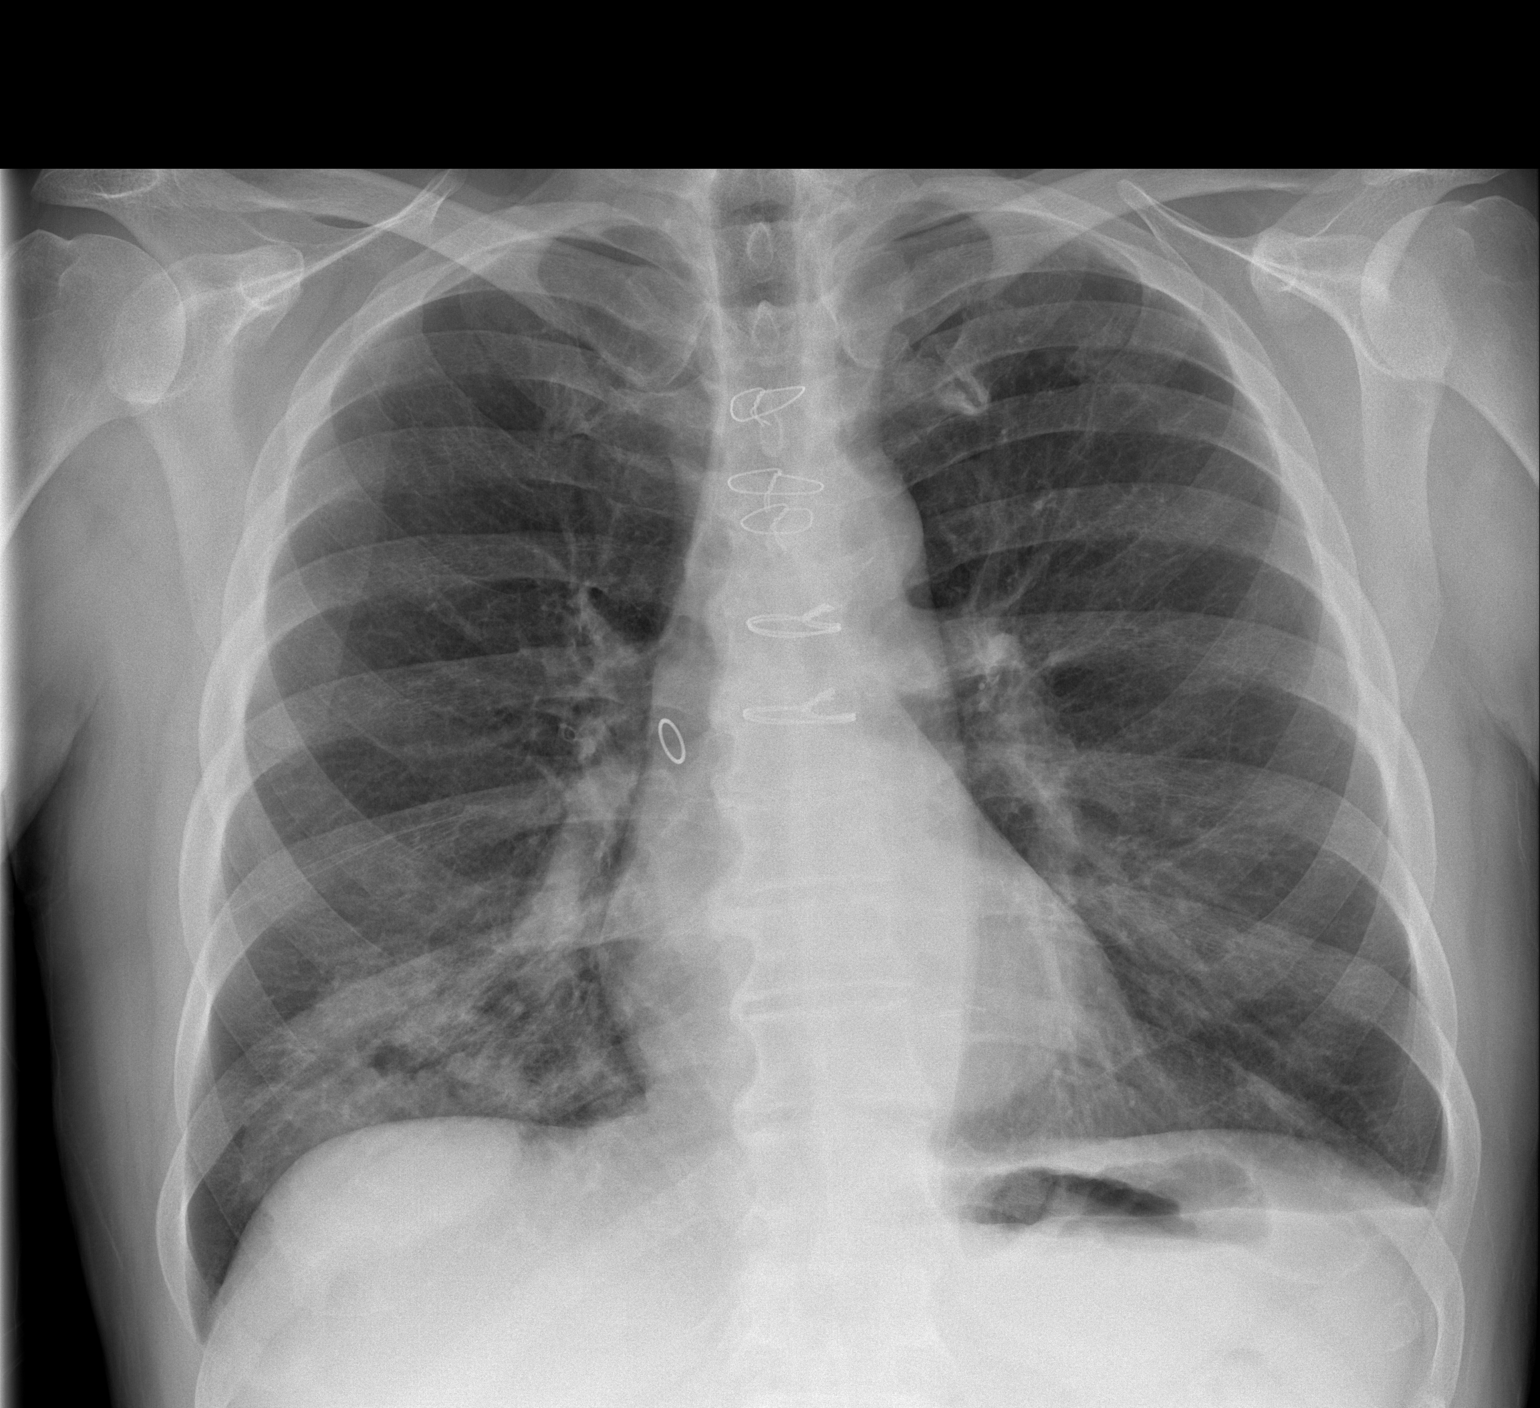
[im 2/2]
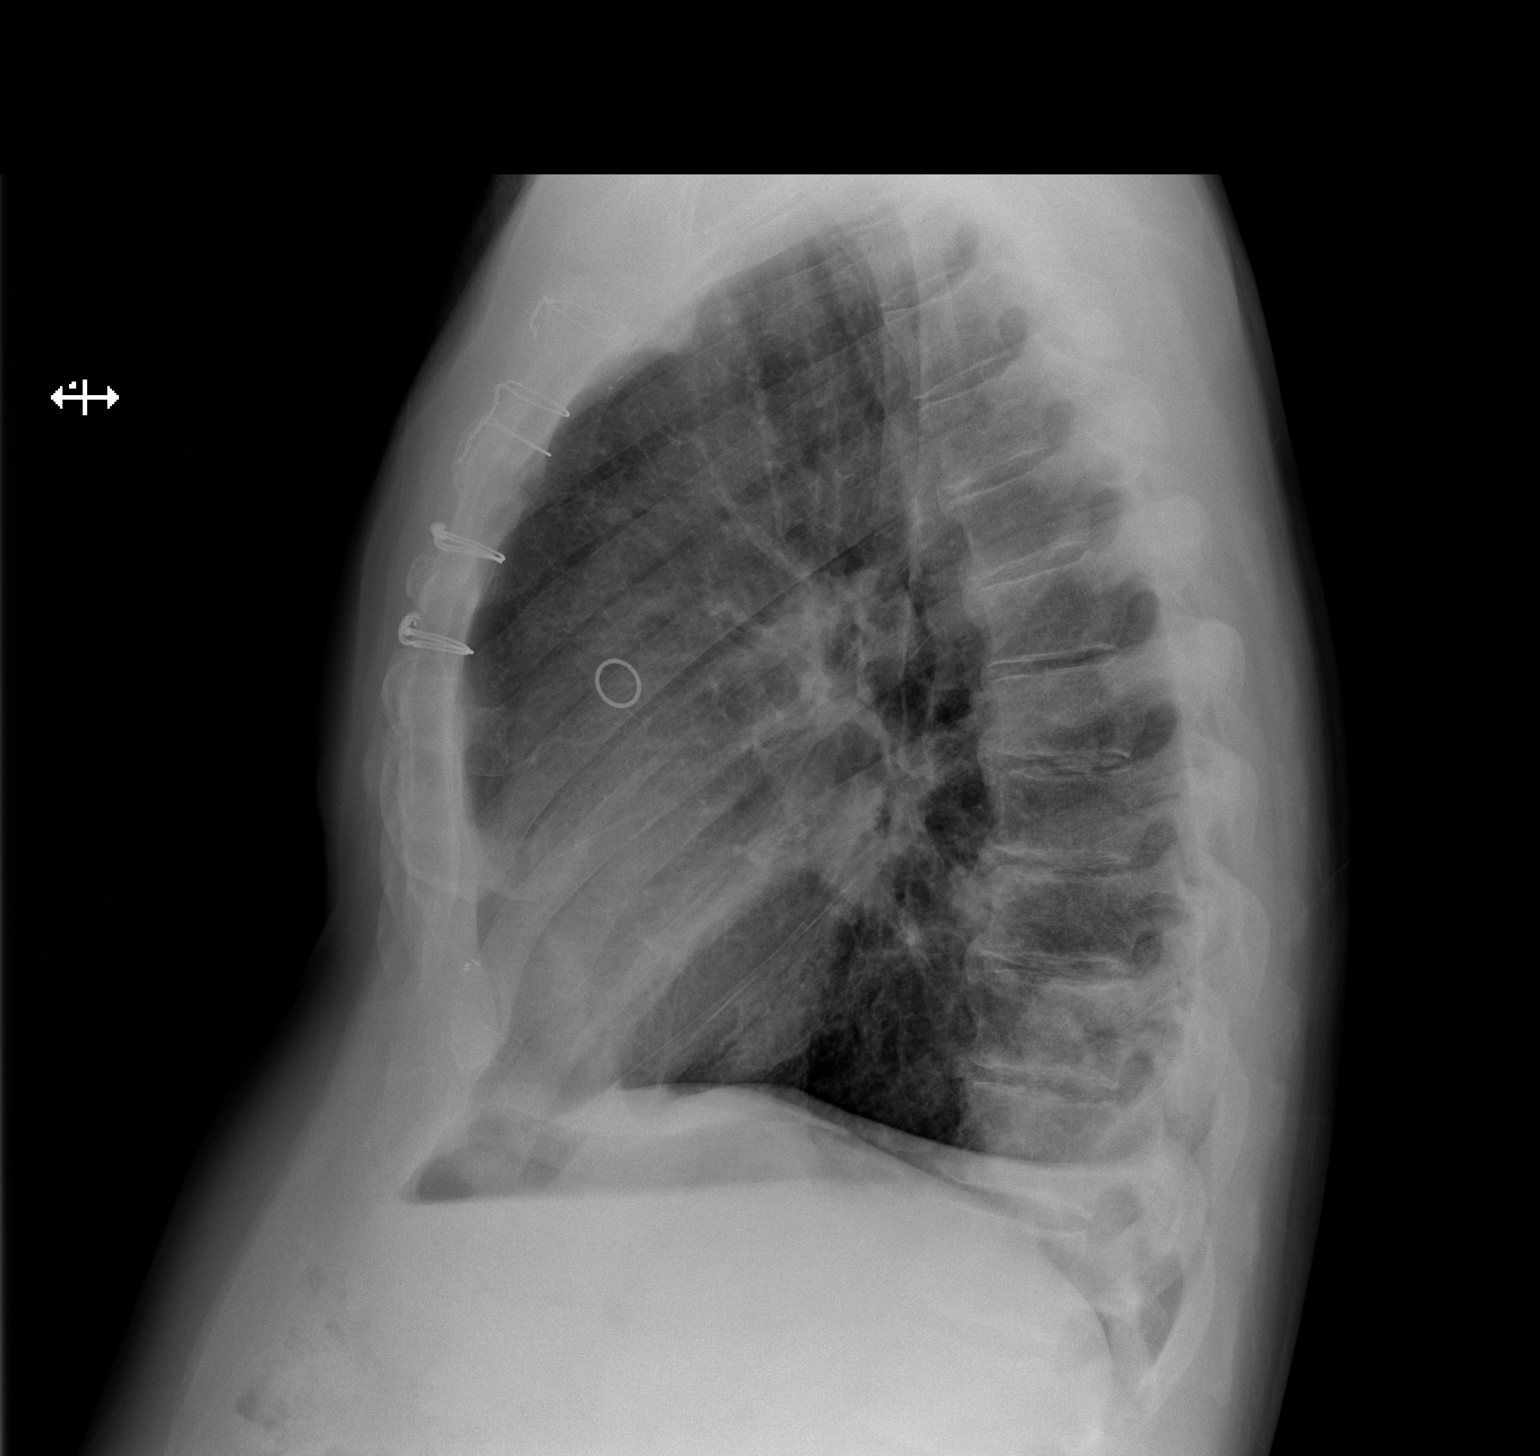

[2 of 2 positions shown; findings below may reference images not displayed]

PROCEDURE:     DXR - DXR CHEST PA (OR AP) AND LATERAL  - [DATE] [DATE]

RESULT:     Comparison is made to the study [DATE].

The lungs are hyperinflated. There is persistent increased density just
above the right hemidiaphragm which lies posteriorly on the lateral film.
There is a meniscus type sign on the left suggesting the presence of pleural
air and fluid. The cardiac silhouette is normal in size. The patient has
undergone previous CABG.
IMPRESSION: There is density posteriorly in the right lower hemithorax
that is little changed from the previous study. I cannot exclude a small
hydropneumothorax on the left. A followup chest CT scan is recommended.

## 2011-10-01 ENCOUNTER — Ambulatory Visit (INDEPENDENT_AMBULATORY_CARE_PROVIDER_SITE_OTHER): Payer: BC Managed Care – PPO | Admitting: Internal Medicine

## 2011-10-01 ENCOUNTER — Encounter: Payer: Self-pay | Admitting: Internal Medicine

## 2011-10-01 ENCOUNTER — Ambulatory Visit: Payer: Self-pay

## 2011-10-01 VITALS — BP 120/68 | HR 100 | Temp 97.8°F | Resp 16 | Wt 171.0 lb

## 2011-10-01 DIAGNOSIS — R042 Hemoptysis: Secondary | ICD-10-CM | POA: Insufficient documentation

## 2011-10-01 DIAGNOSIS — R222 Localized swelling, mass and lump, trunk: Secondary | ICD-10-CM

## 2011-10-01 DIAGNOSIS — R918 Other nonspecific abnormal finding of lung field: Secondary | ICD-10-CM

## 2011-10-01 MED ORDER — GUAIFENESIN-CODEINE 100-10 MG/5ML PO SYRP: 10.0000 mL | ORAL_SOLUTION | Freq: Three times a day (TID) | ORAL | Status: AC | PRN

## 2011-10-01 NOTE — Assessment & Plan Note (Signed)
Secondary to obstruction mass,.  Adding cheratussin to quiet cough.

## 2011-10-01 NOTE — Assessment & Plan Note (Addendum)
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 Ti Christell Constant (surg/Onc) at [facility]

## 2011-10-01 NOTE — Progress Notes (Signed)
[name], male   DOB: [birth year], 66 y.o.   MRN: [id number]  Patient Active Problem List  Diagnoses  . Polyp of colon, adenomatous  . Hyperlipidemia  . Hypertension  . CAD (coronary artery disease), autologous vein bypass graft  . Personal history of pneumonia  . Type II or unspecified type diabetes mellitus with renal manifestations, not stated as uncontrolled  . Gout attack  . Hemoptysis  . Right lower lobe lung mass  . Cough with hemoptysis    Subjective:  CC:   Chief Complaint  Patient presents with  . Follow-up    HPI:   Nathaniel Lane a 66 y.o. male who presents  To have lung resection next Monday for 7 cm RLL mass found last month during ER evaluation for hemoptysis. [name] to do.  Saw [facility] last week for a stress test .  Passed the stress test. Up to date on diabetes labs, hgba1 was done in feb.  Has had had some trouble with cough,  Last time he had hemoptysis was last week during the stress test.    Past Medical History  Diagnosis Date  . Diabetes mellitus   . Hyperlipidemia   . Hypertension   . Myocardial infarction     Past Surgical History  Procedure Date  . Coronary artery bypass graft 2007         The following portions of the patient's history were reviewed and updated as appropriate: Allergies, current medications, and problem list.    Review of Systems:   12 Pt  review of systems was negative except those addressed in the HPI,     History   Social History  . Marital Status: Married    Spouse Name: N/A    Number of Children: N/A  . 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 Smoking status: Former Games developer  . Smokeless tobacco: Never Used  . Alcohol Use: No  . Drug Use: No  . Sexually Active: Not on file   Other Topics Concern  . Not on file   Social History Narrative  . No narrative on file    Objective:  BP 120/68  Pulse 100  Temp(Src)  97.8 F (36.6 C) (Oral)  Resp 16  Wt 171 lb (77.565 kg)  SpO2 98%  General appearance: alert, cooperative and appears stated age Ears: normal TM's and external ear canals both ears Throat: lips, mucosa, and tongue normal; teeth and gums normal Neck: no adenopathy, no carotid bruit, supple, symmetrical, trachea midline and thyroid not enlarged, symmetric, no tenderness/mass/nodules Back: symmetric, no curvature. ROM normal. No CVA tenderness. Lungs: clear to auscultation bilaterally Heart: regular rate and rhythm, S1, S2 normal, no murmur, click, rub or gallop Abdomen: soft, non-tender; bowel sounds normal; no masses,  no organomegaly Pulses: 2+ and symmetric Skin: Skin color, texture, turgor normal. No rashes or lesions Lymph nodes: Cervical, supraclavicular, and axillary nodes normal.  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 Ti Christell Constant (surg/Onc) at [facility]  Cough with hemoptysis Secondary to RLL mass   Hemoptysis Secondary to obstruction mass,.  Adding cheratussin to quiet cough.     Updated Medication List Outpatient Encounter Prescriptions as of 10/01/2011  Medication Sig Dispense Refill  . allopurinol (ZYLOPRIM) 100 MG tablet Take 2 tablets (200 mg total) by mouth daily.  60 tablet  3  . atorvastatin (LIPITOR) 20 MG tablet Take 1 tablet (20 mg total) by mouth at bedtime.  90 tablet  3  . enalapril-hydrochlorothiazide (VASERETIC) 10-25 MG per tablet Take 1 tablet by mouth at bedtime.  90 tablet  3  . metFORMIN (GLUCOPHAGE) 500 MG tablet Take 1 tablet (500 mg total) by mouth 2 (two) times daily with a meal.  180 tablet  3  . niacin (NIASPAN) 1000 MG CR tablet Take 1 tablet (1,000 mg total) by mouth at bedtime.  90 tablet  3  . Tamsulosin HCl (FLOMAX) 0.4 MG CAPS Take 1 capsule (0.4 mg total) by mouth daily.  90 capsule  3  . guaiFENesin-codeine (ROBITUSSIN AC) 100-10 MG/5ML syrup Take 10 mLs by  mouth 3 (three) times daily as needed for cough.  240 mL  1  . DISCONTD: aspirin 81 MG tablet Take 81 mg by mouth daily.           No orders of the defined types were placed in this encounter.    No Follow-up on file.

## 2011-10-01 NOTE — Patient Instructions (Signed)
I am prescribing a medicine for cough.

## 2011-10-01 NOTE — Assessment & Plan Note (Signed)
Secondary to RLL mass

## 2011-10-08 ENCOUNTER — Inpatient Hospital Stay: Payer: Self-pay

## 2011-10-08 IMAGING — CR DG CHEST 1V PORT
1 series · 1 of 1 positions shown · non-contrast
Comparison: none

REASON FOR EXAM: RLL resection
COMMENTS:

PROCEDURE:     DXR - DXR PORTABLE CHEST SINGLE VIEW  - [DATE] [DATE]
RESULT:     Comparison: None

[portable]
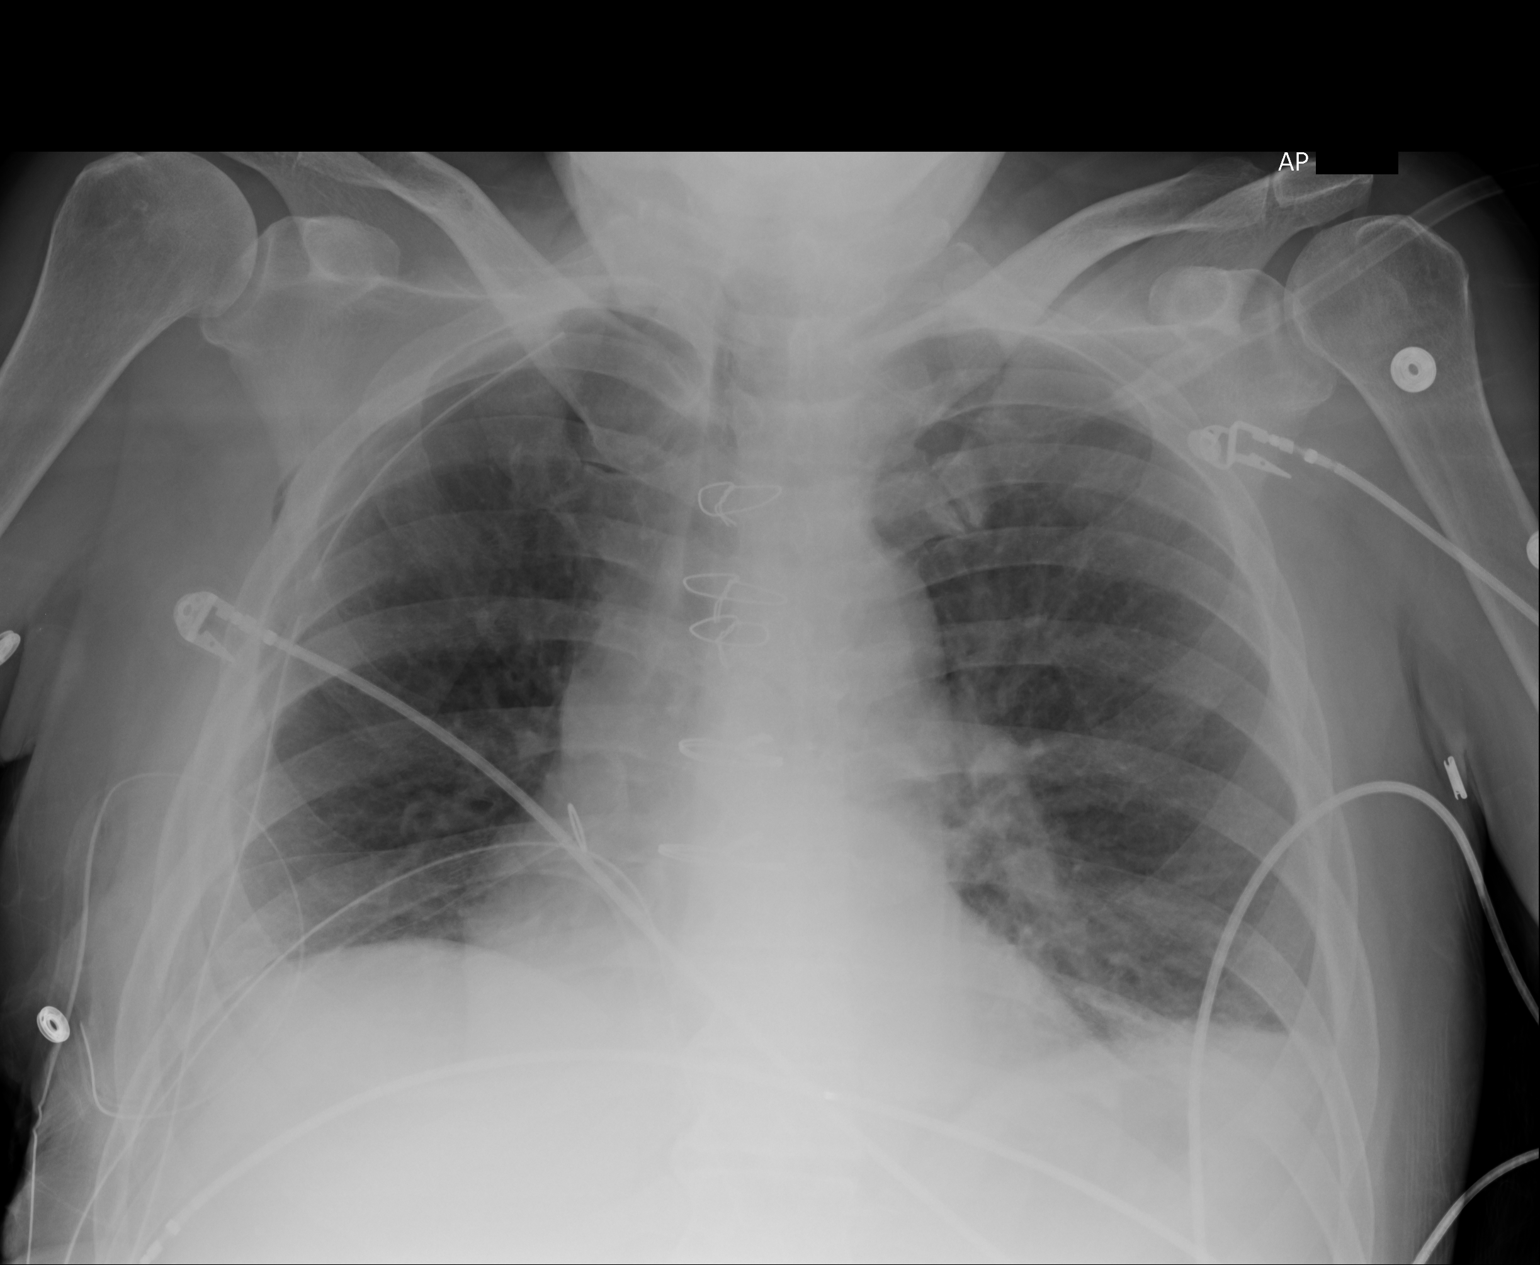

[1 of 1 positions shown; findings below may reference images not displayed]

FINDINGS: Single portable AP chest radiograph is provided. There are 2 right-sided
chest tubes in satisfactory position. There is no obvious right
pneumothorax. There is no right pleural effusion. The left lung is clear.
There is evidence of prior median sternotomy. Normal cardiomediastinal
silhouette. The osseous structures are unremarkable.
IMPRESSION: Two right-sided chest tubes in satisfactory position without pneumothorax.

[REDACTED]

## 2011-10-09 LAB — BASIC METABOLIC PANEL
Creatinine: 1.29 mg/dL (ref 0.60–1.30)
EGFR (Non-African Amer.): 59 — ABNORMAL LOW
Glucose: 298 mg/dL — ABNORMAL HIGH (ref 65–99)
Osmolality: 278 (ref 275–301)
Potassium: 4.3 mmol/L (ref 3.5–5.1)

## 2011-10-09 LAB — CBC WITH DIFFERENTIAL/PLATELET
Basophil #: 0 10*3/uL (ref 0.0–0.1)
Basophil %: 0.1 %
Eosinophil #: 0 10*3/uL (ref 0.0–0.7)
Eosinophil %: 0 %
HCT: 32.5 % — ABNORMAL LOW (ref 40.0–52.0)
HGB: 10.1 g/dL — ABNORMAL LOW (ref 13.0–18.0)
Lymphocyte %: 4.1 %
MCH: 25.3 pg — ABNORMAL LOW (ref 26.0–34.0)
MCV: 82 fL (ref 80–100)
Neutrophil %: 91.5 %
Platelet: 235 10*3/uL (ref 150–440)
RBC: 3.99 10*6/uL — ABNORMAL LOW (ref 4.40–5.90)
RDW: 15.4 % — ABNORMAL HIGH (ref 11.5–14.5)
WBC: 16.8 10*3/uL — ABNORMAL HIGH (ref 3.8–10.6)

## 2011-10-09 IMAGING — CR DG CHEST 1V PORT
1 series · 1 of 1 positions shown · non-contrast
Comparison: none

REASON FOR EXAM: Assess for Pleural Effusion
COMMENTS:

PROCEDURE:     DXR - DXR PORTABLE CHEST SINGLE VIEW  - [DATE]  [DATE]
RESULT:     Comparison: None

[portable]
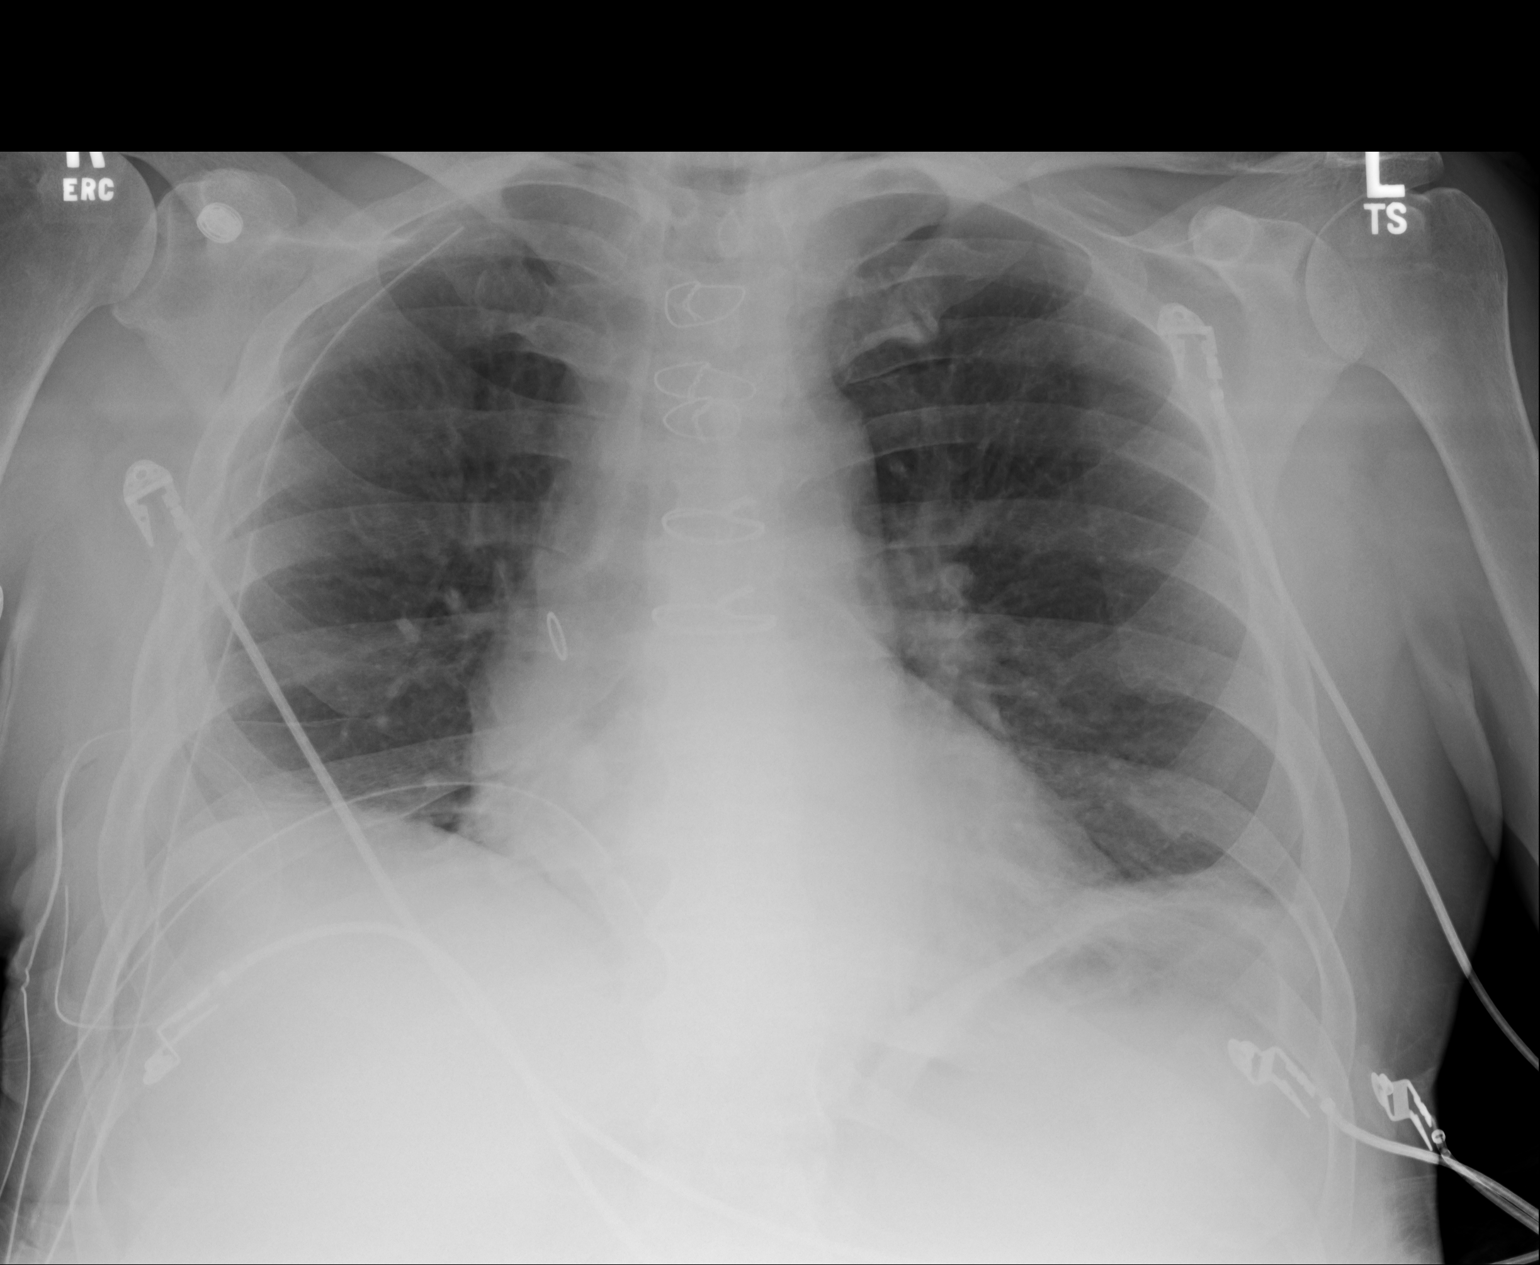

[1 of 1 positions shown; findings below may reference images not displayed]

FINDINGS: Single portable AP chest radiograph is provided. There are 2 right-sided
chest tubes in satisfactory position. There is no obvious right
pneumothorax. There is no right pleural effusion. Mild left basilar
atelectasis is present. There is evidence of prior median sternotomy. Normal
cardiomediastinal silhouette. The osseous structures are unremarkable.
IMPRESSION: Two right-sided chest tubes in satisfactory position without pneumothorax.
No significant left pleural effusion.

[REDACTED]

## 2011-10-10 IMAGING — CR DG CHEST 1V PORT
1 series · 1 of 1 positions shown · non-contrast
Comparison: none

REASON FOR EXAM: Assess for Pleural Effusion
COMMENTS:

[ap]
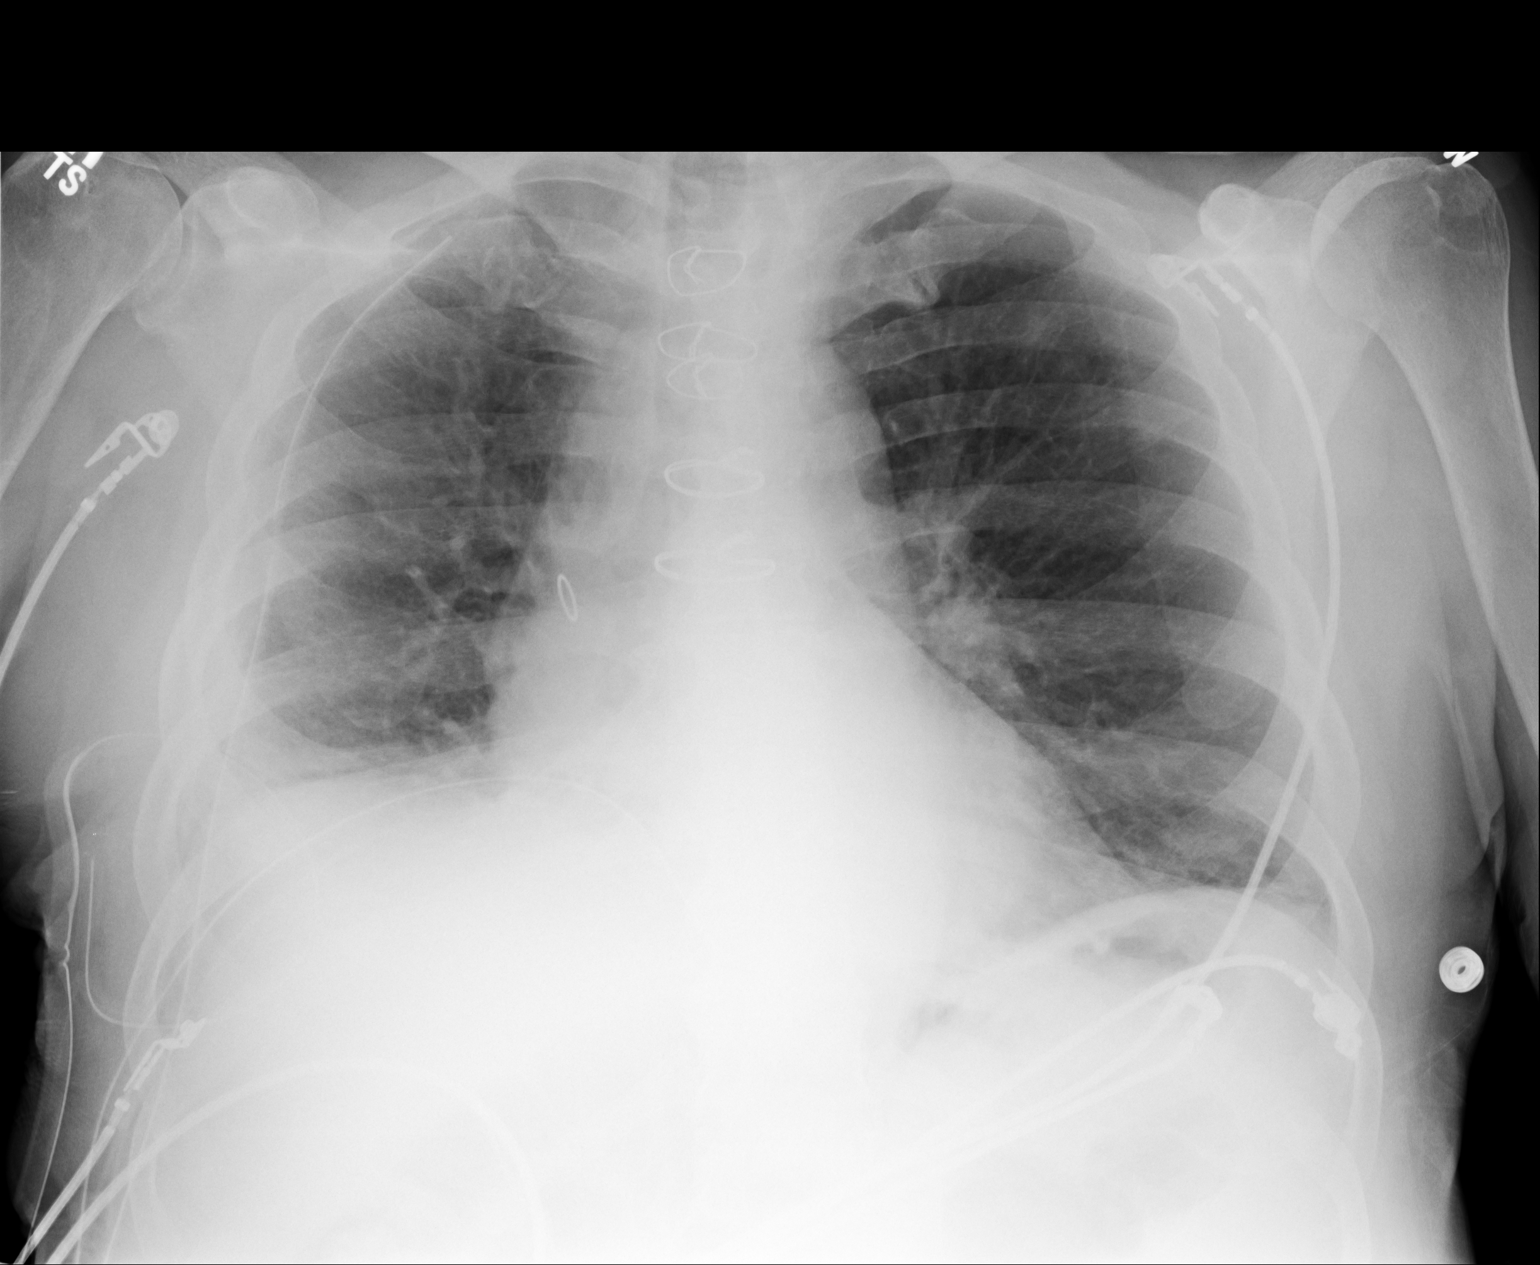

[1 of 1 positions shown; findings below may reference images not displayed]

PROCEDURE:     DXR - DXR PORTABLE CHEST SINGLE VIEW  - [DATE]  [DATE]

RESULT:     Comparison is made to the prior exam of [DATE]. There is
observed a chest tube with the tip projected near the right apex. A second
chest tube is present with the tip projected over the medial inferior aspect
of the right lung. No pleural effusion or pneumothorax is seen. The lung
fields bilaterally are clear of infiltrate. No acute changes of the heart or
pulmonary vasculature are identified. There is a trace of subcutaneous
emphysema in the soft tissues superior to the right clavicle and unchanged
since the prior exam.
IMPRESSION: 1.     No acute changes are identified.

## 2011-10-11 ENCOUNTER — Ambulatory Visit: Payer: BC Managed Care – PPO | Admitting: Internal Medicine

## 2011-10-11 LAB — PATHOLOGY REPORT

## 2011-10-11 IMAGING — CR DG CHEST 1V PORT
1 series · 1 of 1 positions shown · non-contrast
Comparison: none

REASON FOR EXAM: Assess for Pleural Effusion
COMMENTS:

[portable]
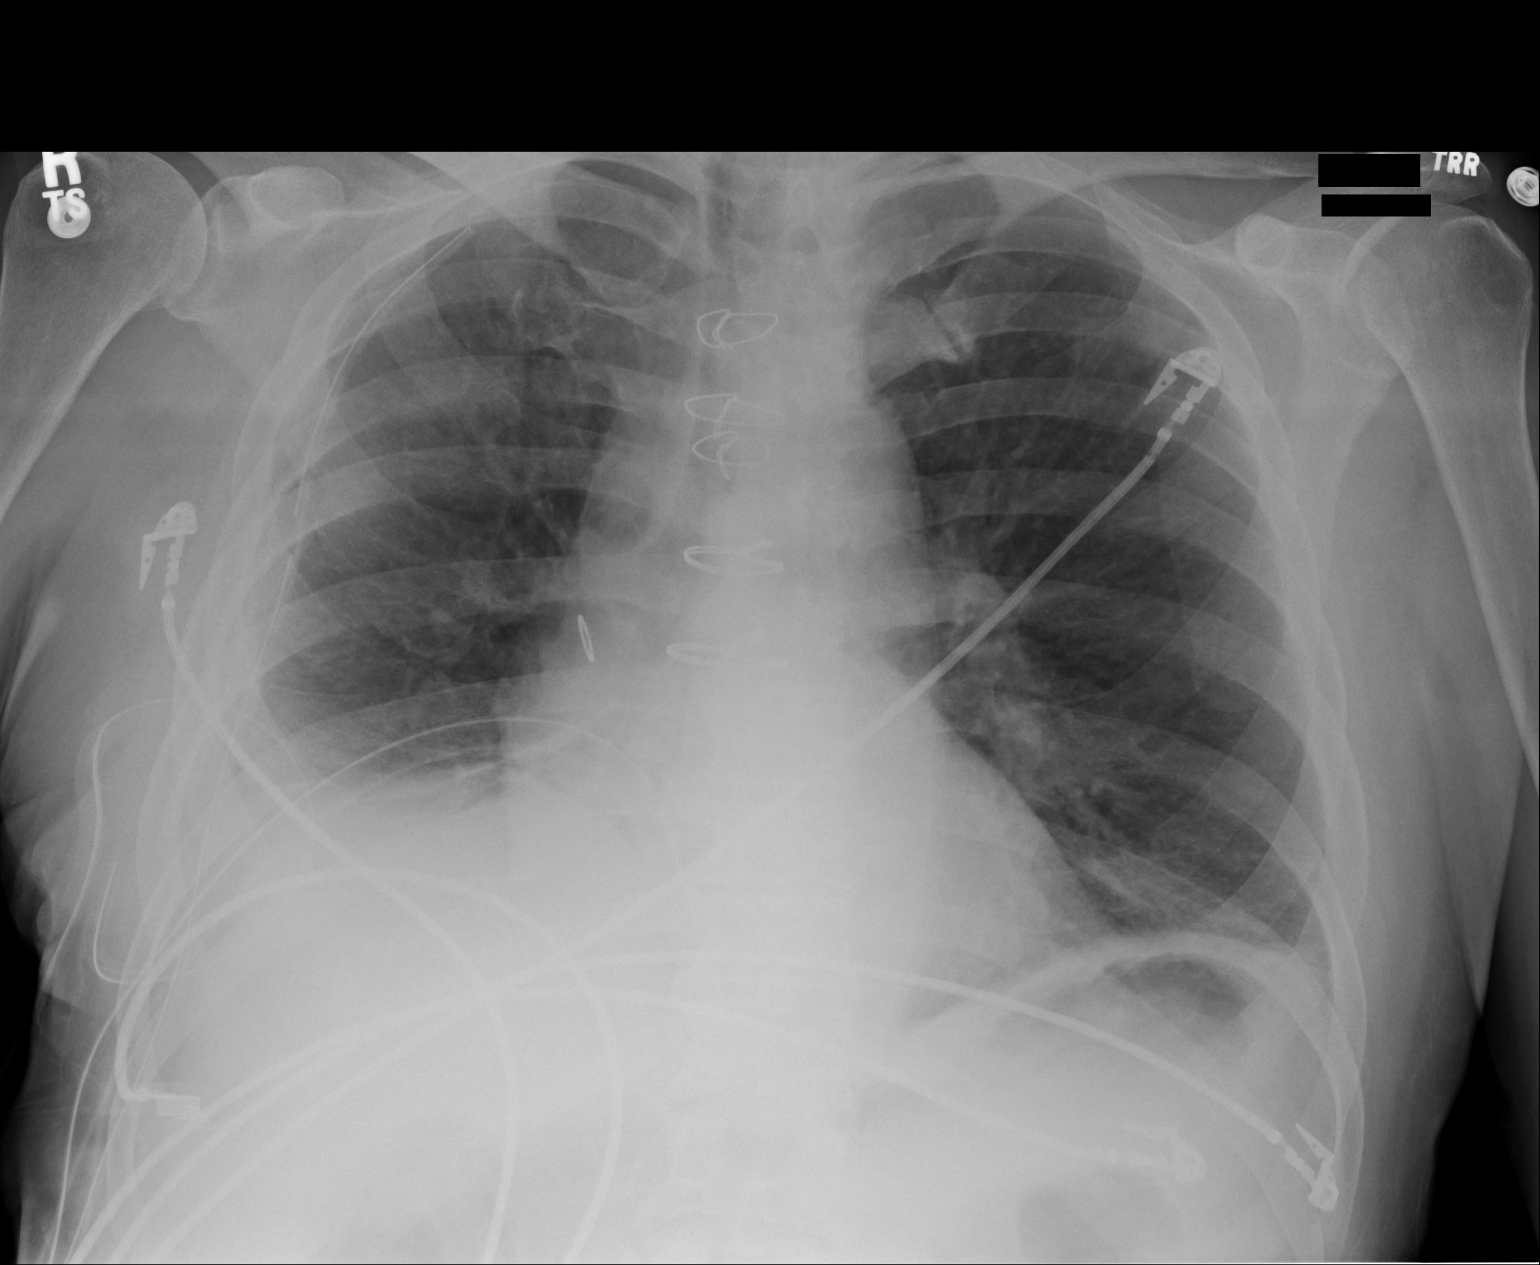

[1 of 1 positions shown; findings below may reference images not displayed]

PROCEDURE:     DXR - DXR PORTABLE CHEST SINGLE VIEW  - [DATE]  [DATE]

RESULT:

Comparison is made to the prior exam of [DATE].

There is haziness at the right costophrenic angle compatible with a trace
effusion, atelectasis or post operative reactive change. Multiple right
chest tubes remain present. No pneumothorax is seen. There is minimal
atelectasis at the left base. The left lung otherwise is clear. The heart is
normal. Monitoring electrodes are present.
IMPRESSION: 1.  There is haziness at the right base compatible with a trace effusion,
atelectasis or post operative change.
2.  No definite pneumothorax is seen.
3.  Multiple, right chest tubes are present.
4.  The left lung field is clear.

## 2011-10-12 LAB — BASIC METABOLIC PANEL
BUN: 23 mg/dL — ABNORMAL HIGH (ref 7–18)
Calcium, Total: 8.4 mg/dL — ABNORMAL LOW (ref 8.5–10.1)
Chloride: 99 mmol/L (ref 98–107)
Glucose: 160 mg/dL — ABNORMAL HIGH (ref 65–99)
Osmolality: 275 (ref 275–301)
Potassium: 4.1 mmol/L (ref 3.5–5.1)

## 2011-10-12 IMAGING — CR DG CHEST 2V
1 series · 2 of 2 positions shown · non-contrast
Comparison: none

REASON FOR EXAM: assess for pleural effusion
COMMENTS:

[Series 3: x chest ap · 0.14mm/px · 2 of 2 slices shown]
[im 1/2]
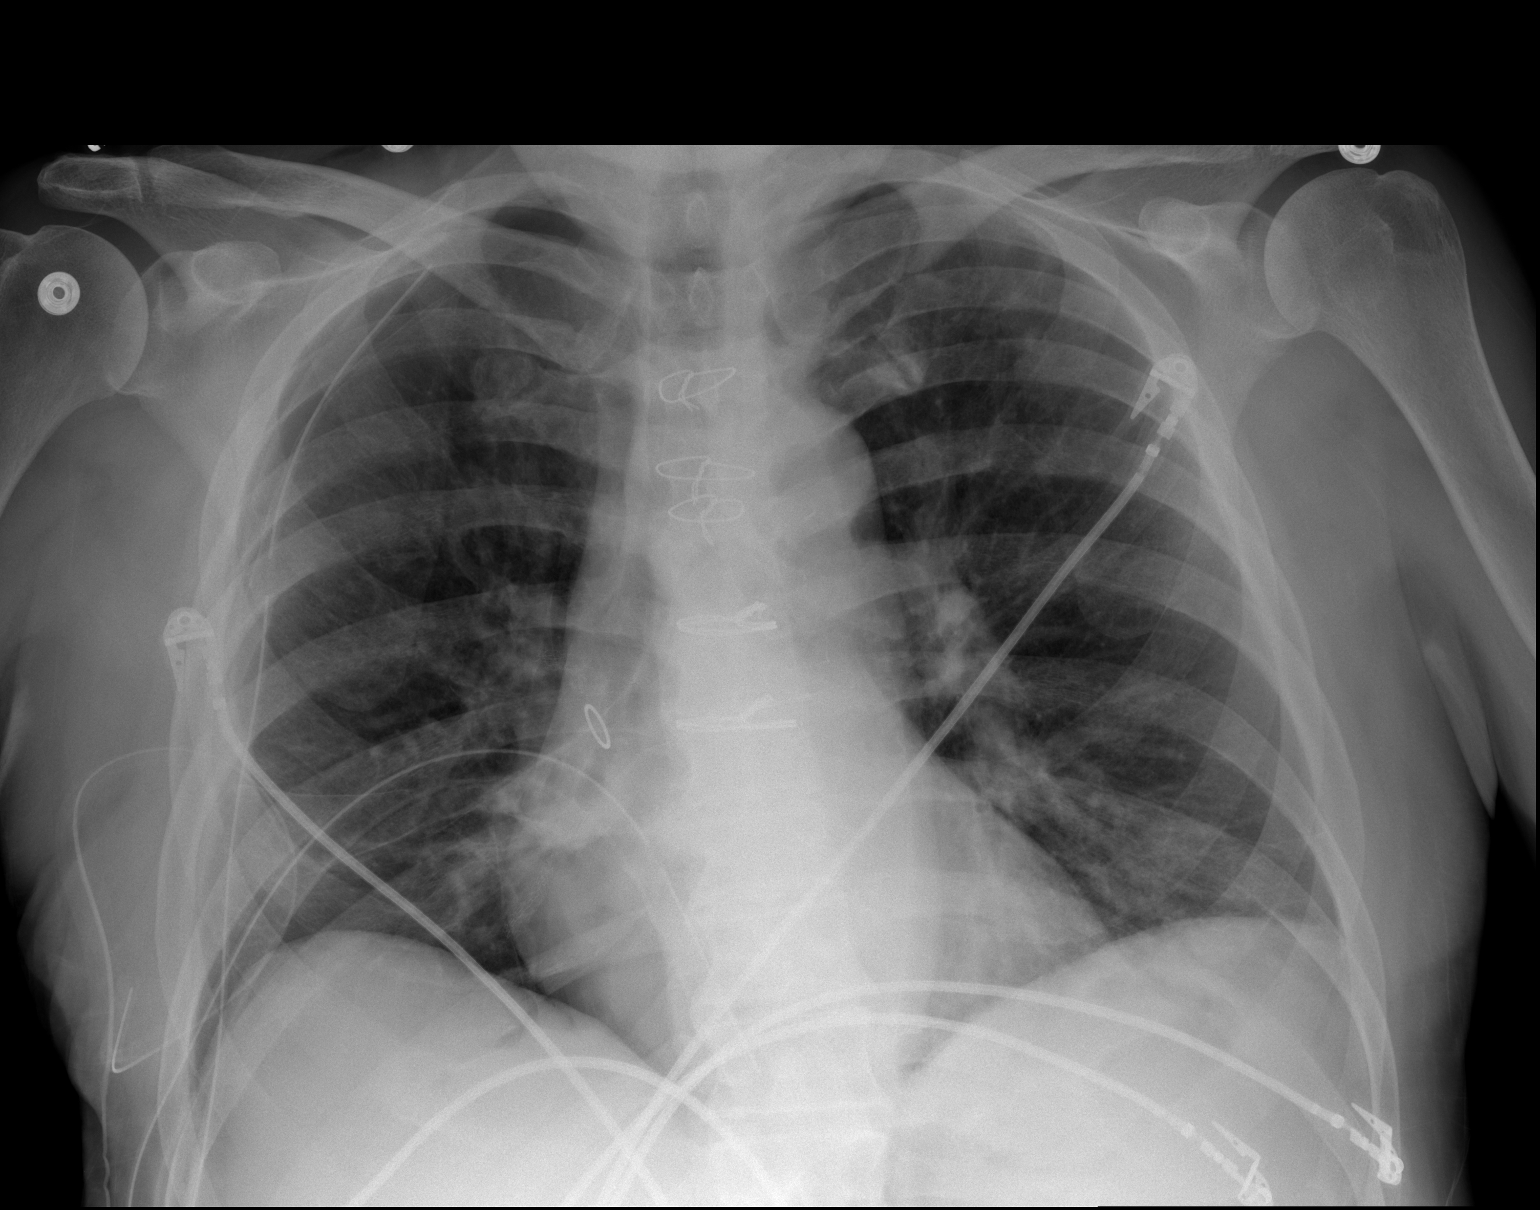
[im 2/2]
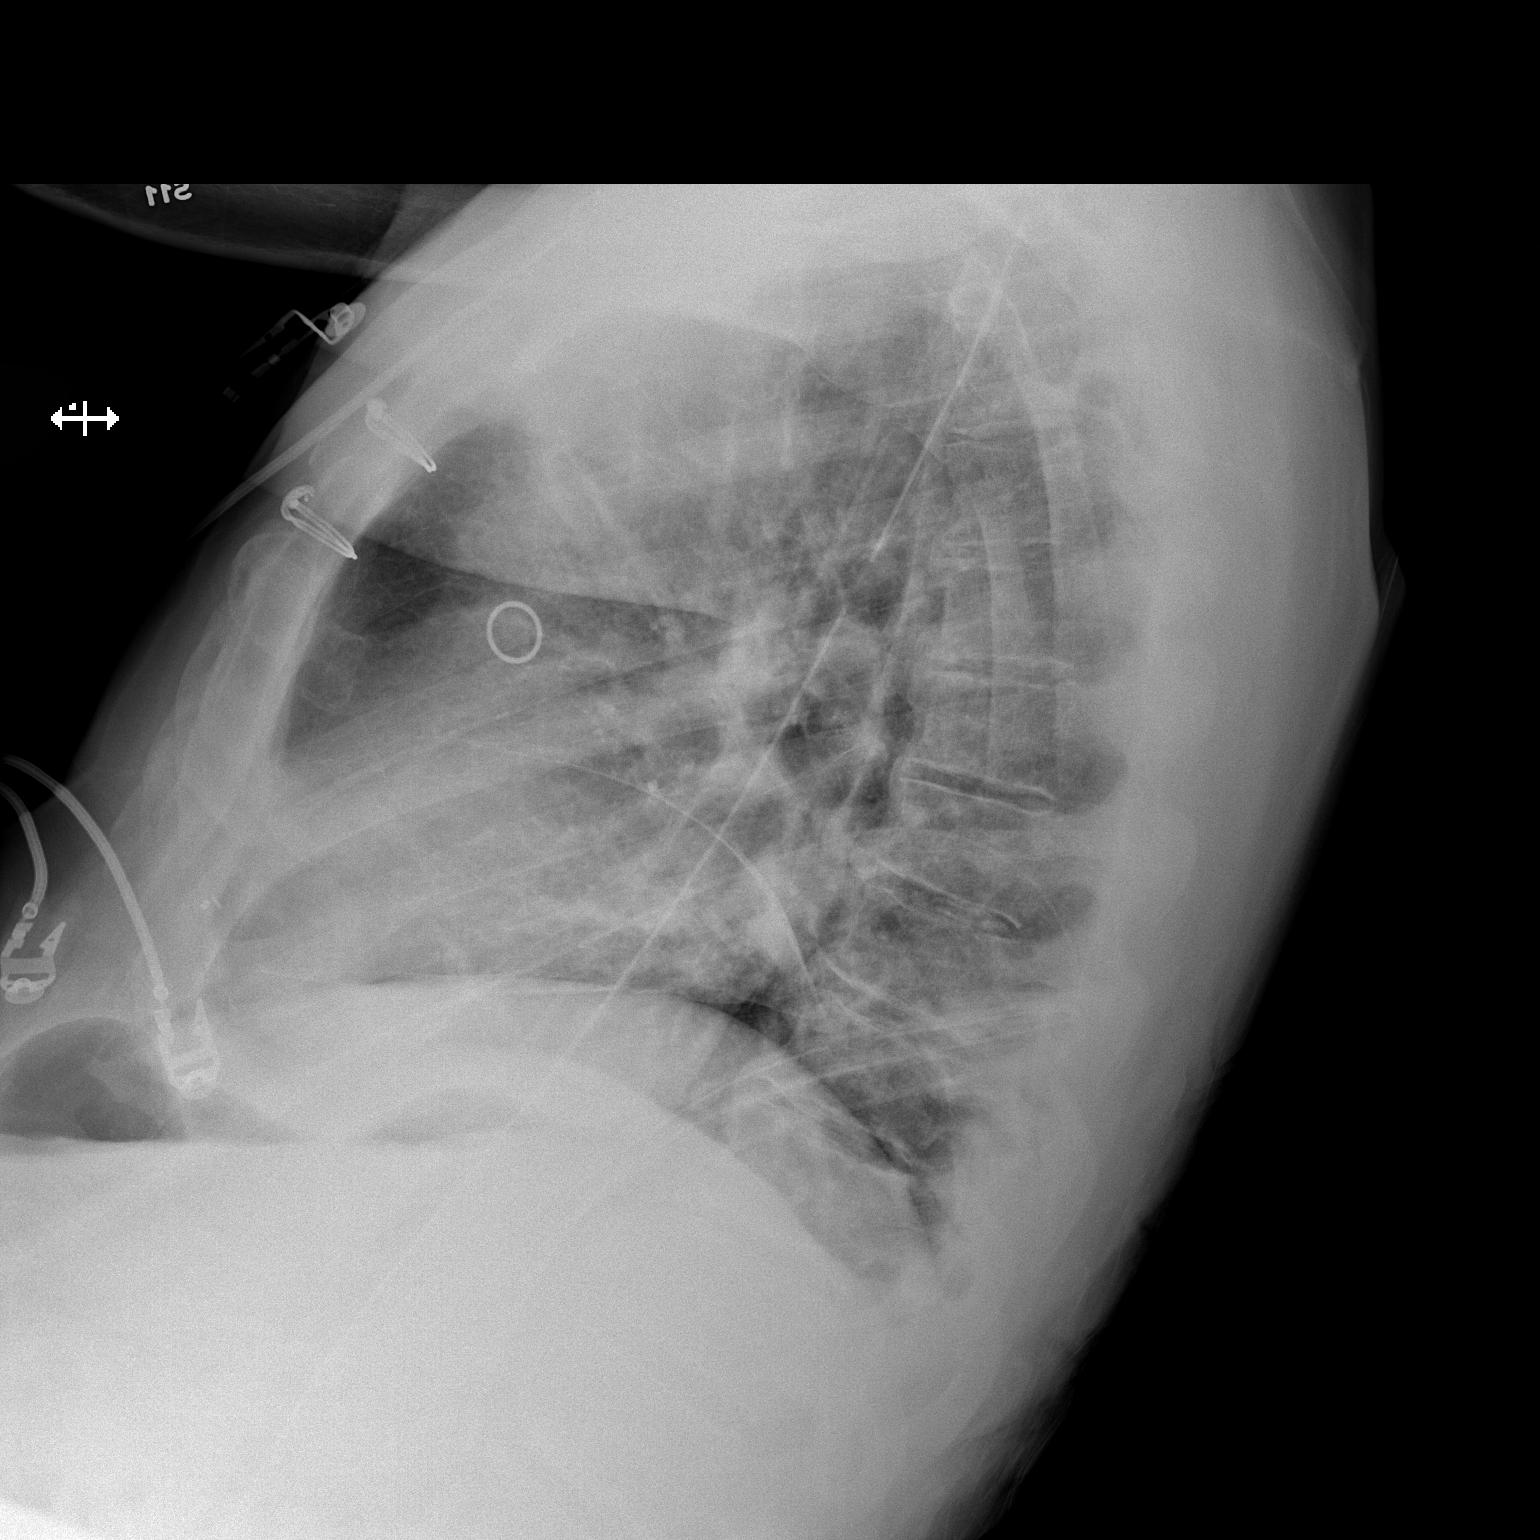

[2 of 2 positions shown; findings below may reference images not displayed]

PROCEDURE:     DXR - DXR CHEST PA (OR AP) AND LATERAL  - [DATE]  [DATE]

RESULT:     Comparison is made to the prior exam of [DATE]. The lung
fields are clear. The previously noted increased density at the right lung
base is no longer seen. Multiple right chest tubes are again noted . There
is a small pneumothorax at the right upper lobe where there is approximately
1.7 cm separation of the visceral and parietal pleura. The left lung field
is clear. Heart size is normal. Monitoring electrodes are present.
IMPRESSION: 1. There is a small right upper lobe pneumothorax where there is
approximately 1.7 cm separation of visceral and parietal pleura.
2. Multiple right chest tubes are observed.
3. The increased density previously observed at the right base is no longer
seen.

## 2011-10-15 IMAGING — CR DG CHEST 2V
1 series · 3 of 3 positions shown · non-contrast
Comparison: none

REASON FOR EXAM: assess for pleural effusion
COMMENTS:

[Series 5: x chest ap · 0.14mm/px · 3 of 3 slices shown]
[im 1/3]
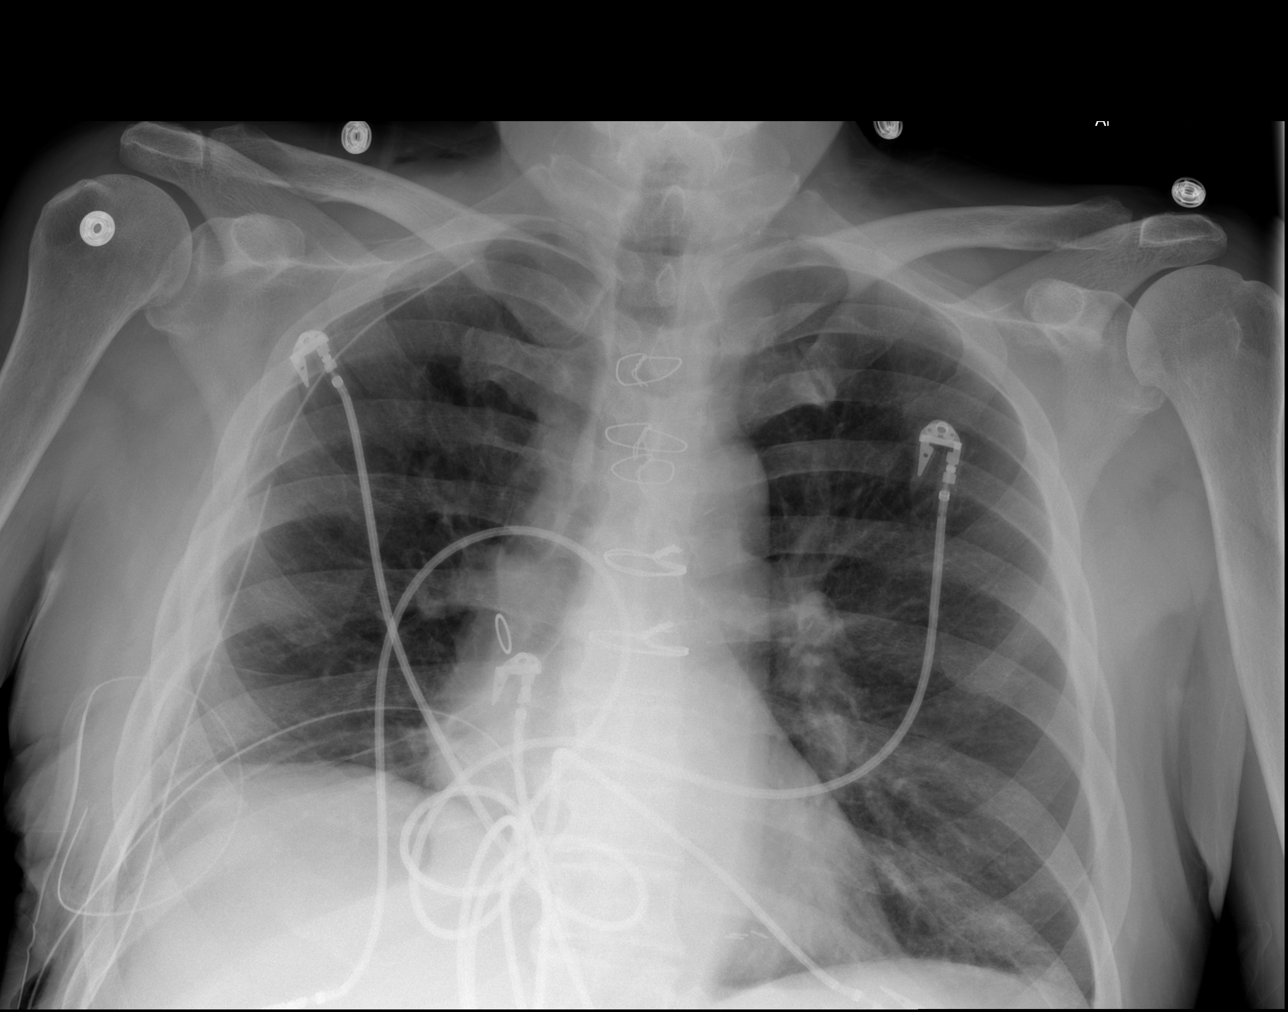
[im 2/3]
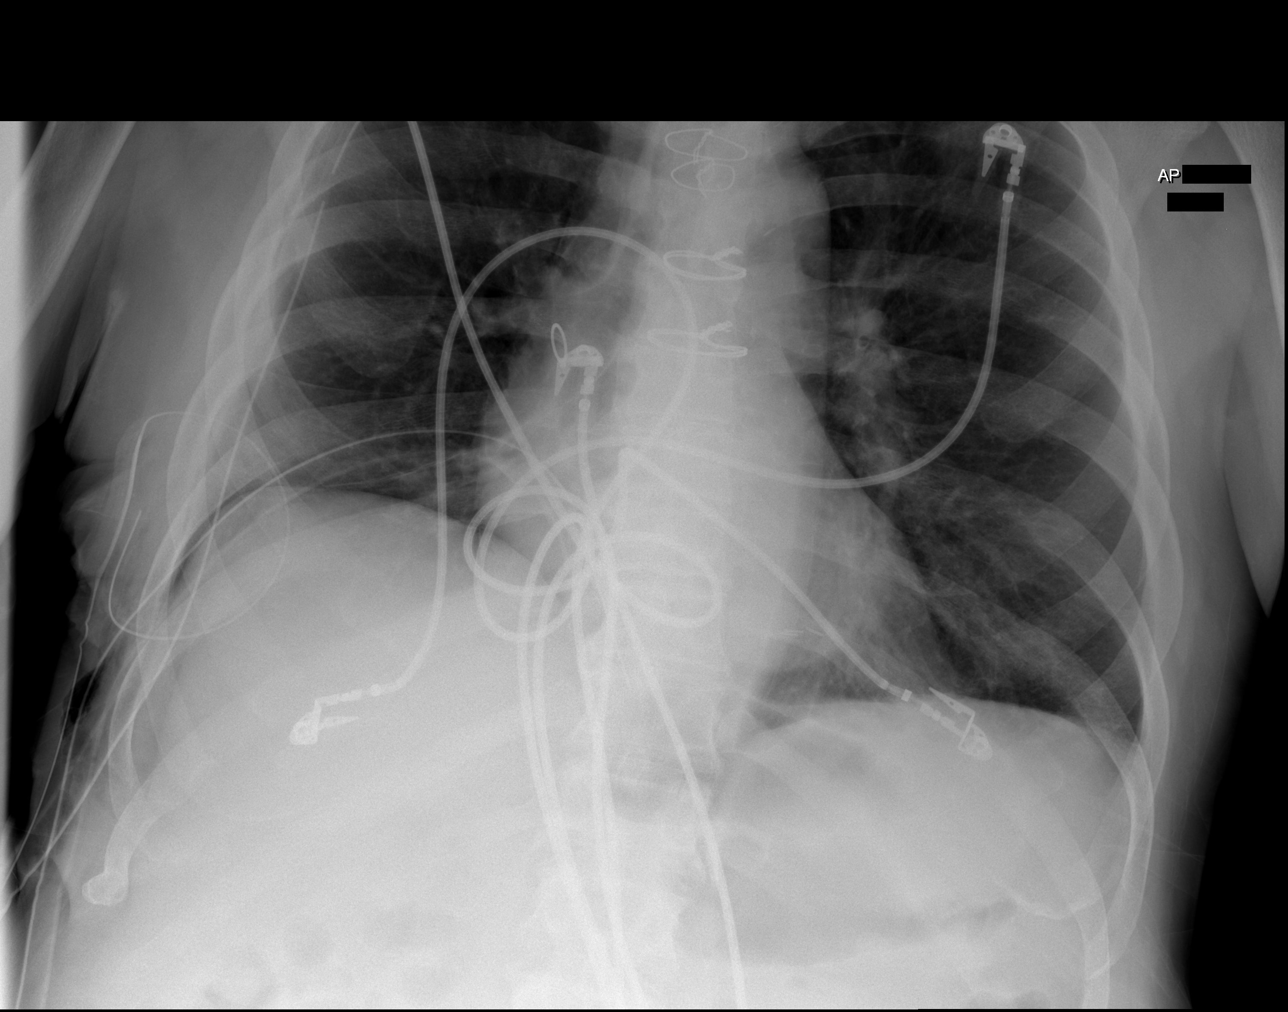
[im 3/3]
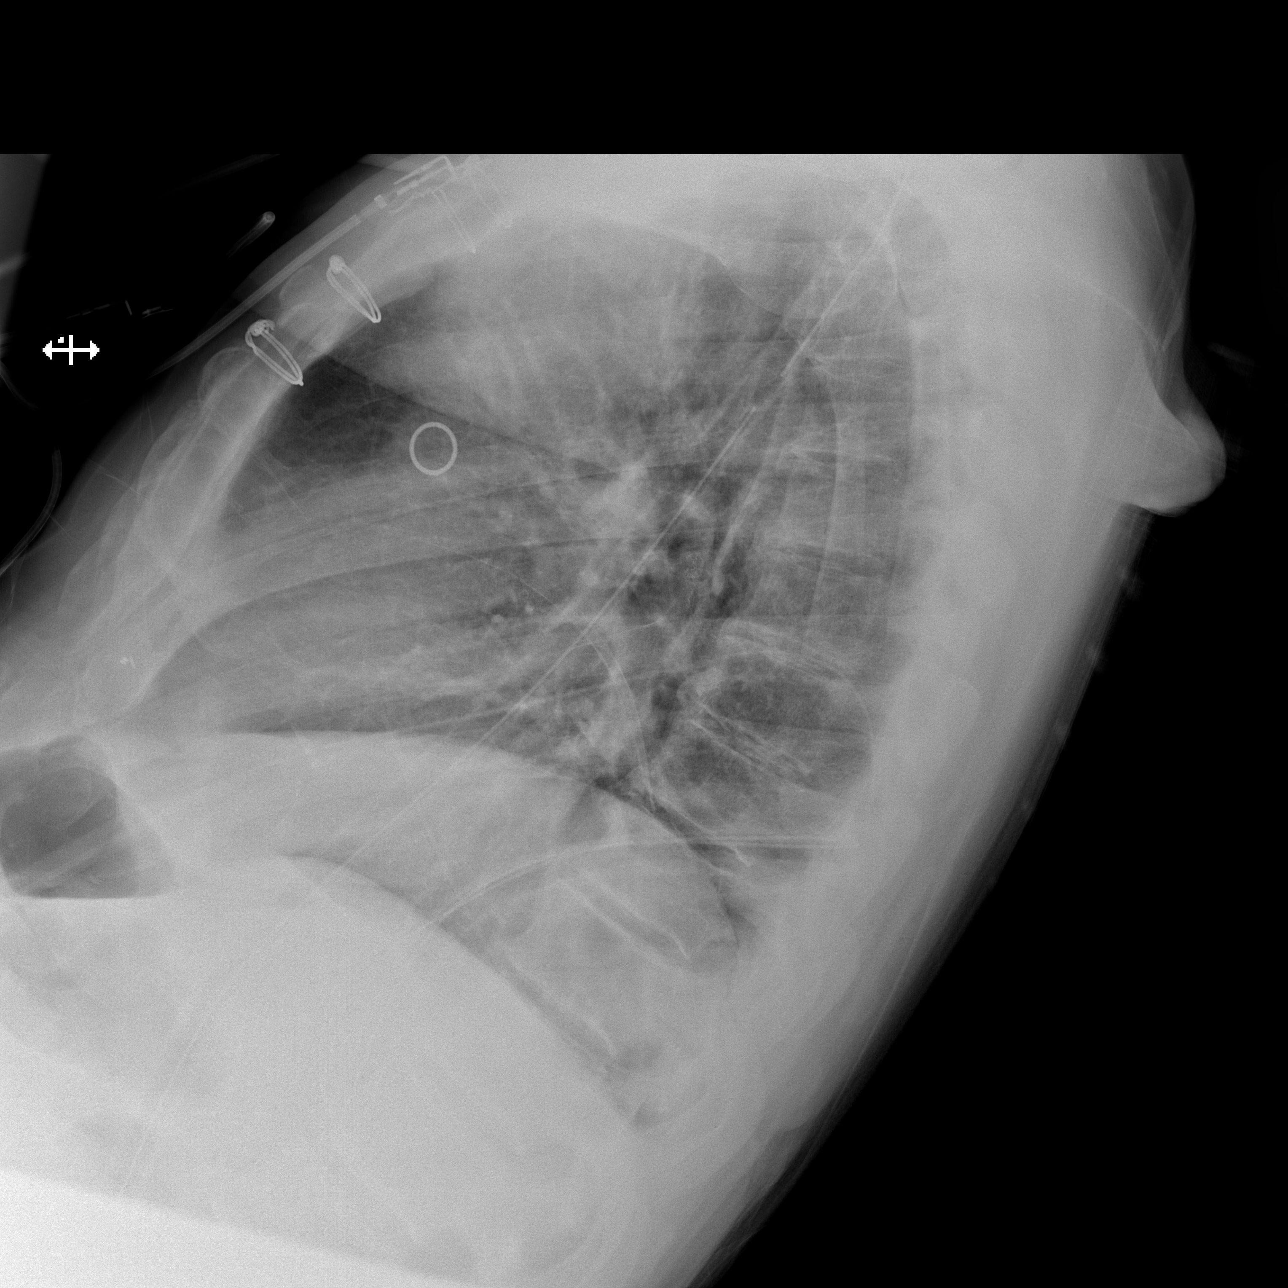

[3 of 3 positions shown; findings below may reference images not displayed]

PROCEDURE:     DXR - DXR CHEST PA (OR AP) AND LATERAL  - [DATE] [DATE]

RESULT:     Comparison is made to the study [DATE].

Right chest tube is present with the tip at the right lung apex. The lungs
are fully inflated and clear. The cardiac silhouette is normal. Cardiac
monitoring electrodes are present. CABG changes are present.
IMPRESSION: 1. Two right chest tube remain in place. Another tube appears to be in the
subcutaneous tissues. No acute abnormality or pneumothorax evident.

[REDACTED]

## 2011-10-16 IMAGING — CR DG CHEST 2V
1 series · 2 of 2 positions shown · non-contrast
Comparison: none

REASON FOR EXAM: assess for pleural effusion
COMMENTS:

[Series 1: pa · 0.17mm/px · 2 of 2 slices shown]
[im 1/2]
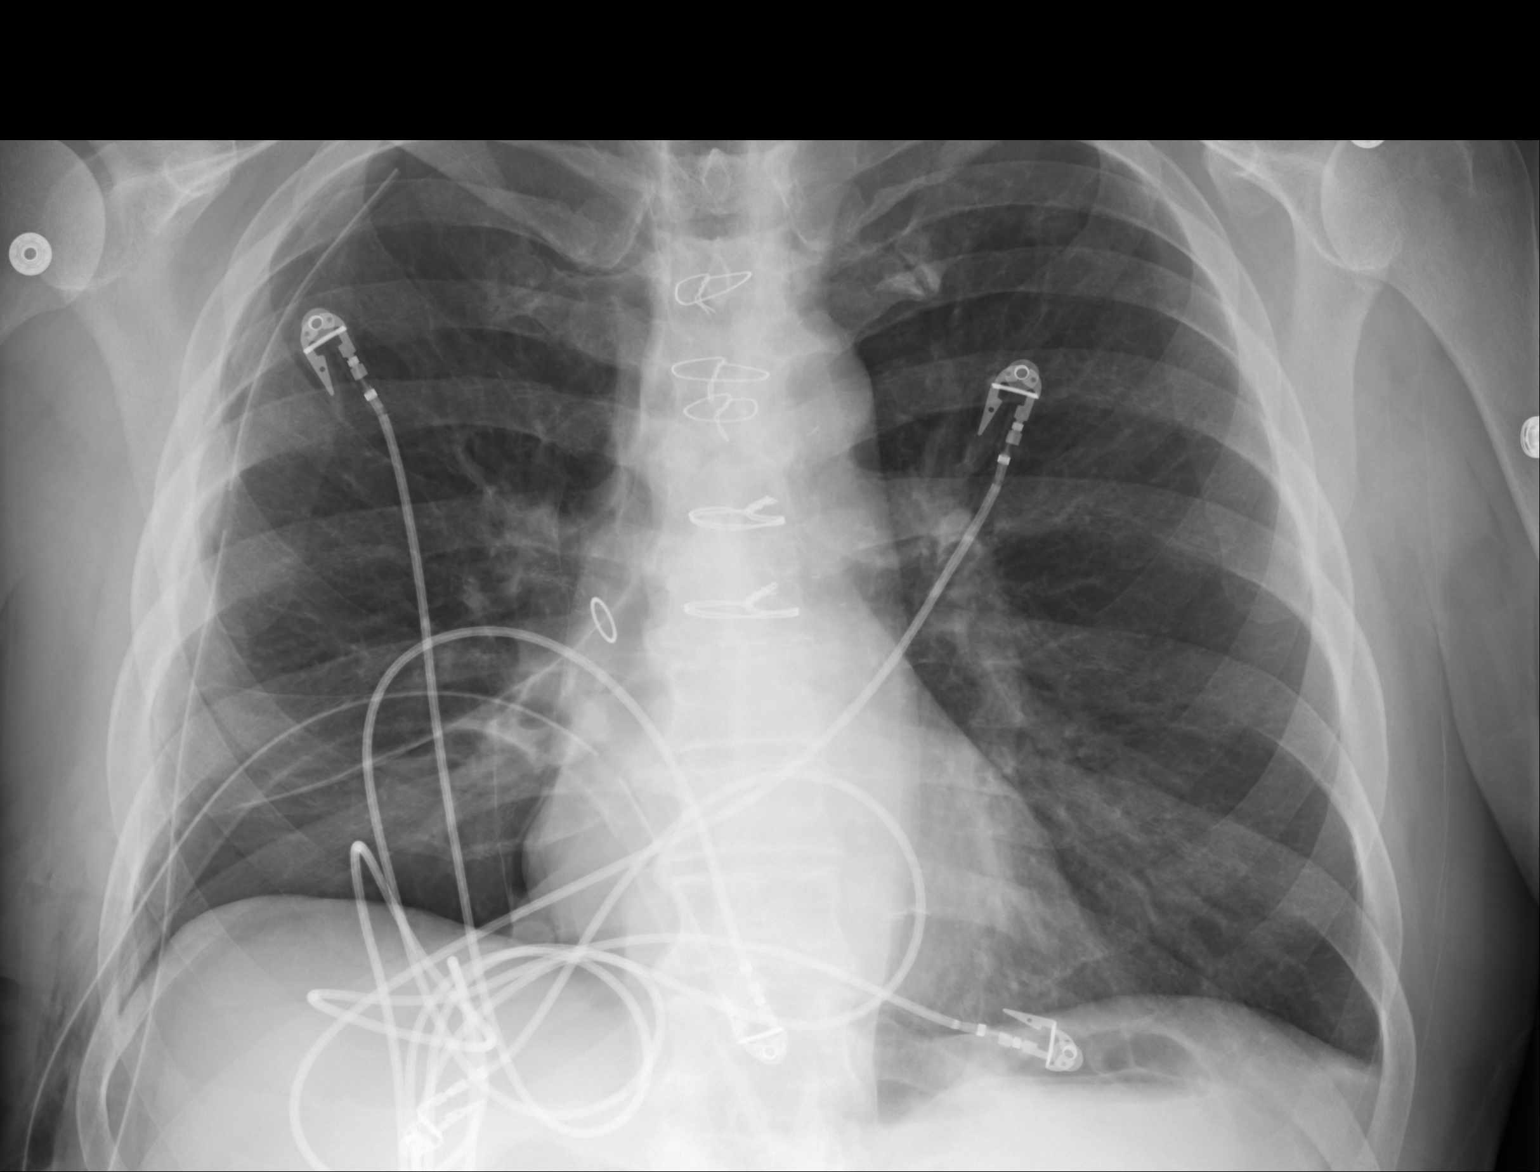
[im 2/2]
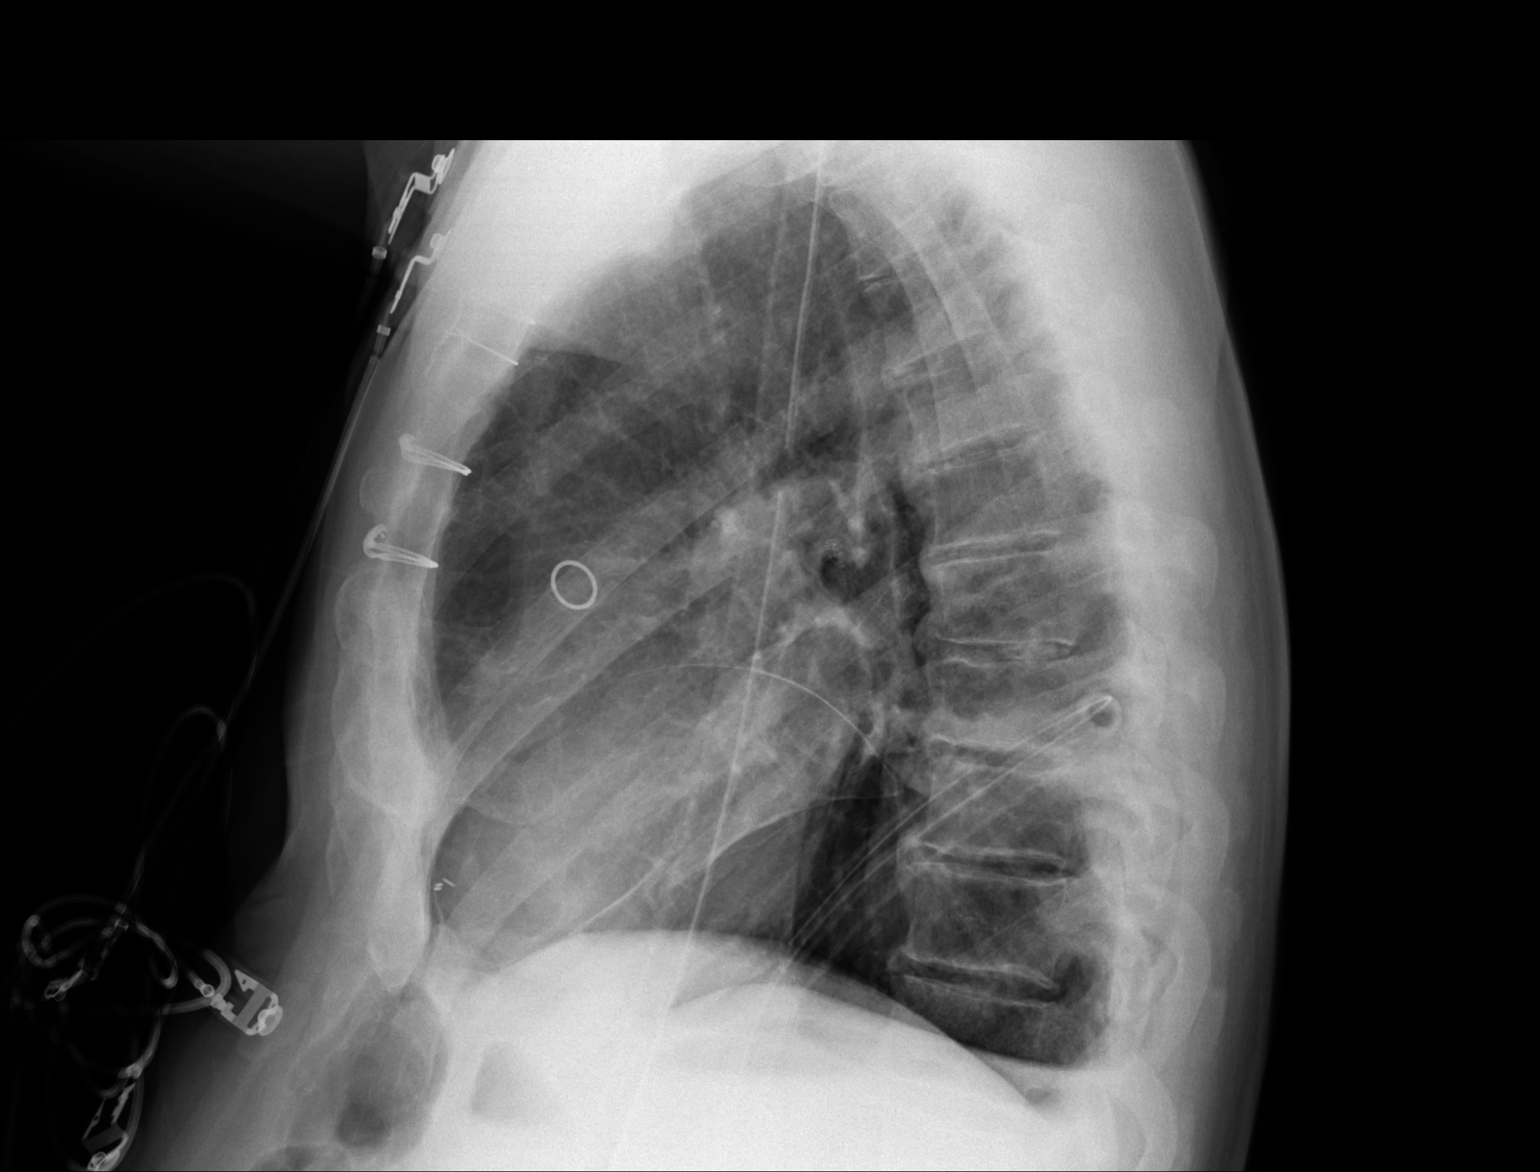

[2 of 2 positions shown; findings below may reference images not displayed]

PROCEDURE:     DXR - DXR CHEST PA (OR AP) AND LATERAL  - [DATE]  [DATE]

RESULT:     Comparison is made to the study [DATE] at [DATE] a.m.
Two chest tubes remain present. Small right apical pneumothorax persists.
Improved aeration is seen in the right lung base with some minimal residual
atelectasis. The left lung appears fully inflated and clear. The heart is
normal in size. CABG changes are present.
IMPRESSION: 1. No significant interval change. Small right-sided pneumothorax with 2
right chest tubes present. Right lung base atelectasis in the right lower
lobe.

[REDACTED]

## 2011-10-16 IMAGING — CR DG CHEST 2V
1 series · 3 of 3 positions shown · non-contrast
Comparison: none

REASON FOR EXAM: assess for pleural effusion
COMMENTS:

[Series 8: w chest pa · 0.14mm/px · 3 of 3 slices shown]
[im 1/3]
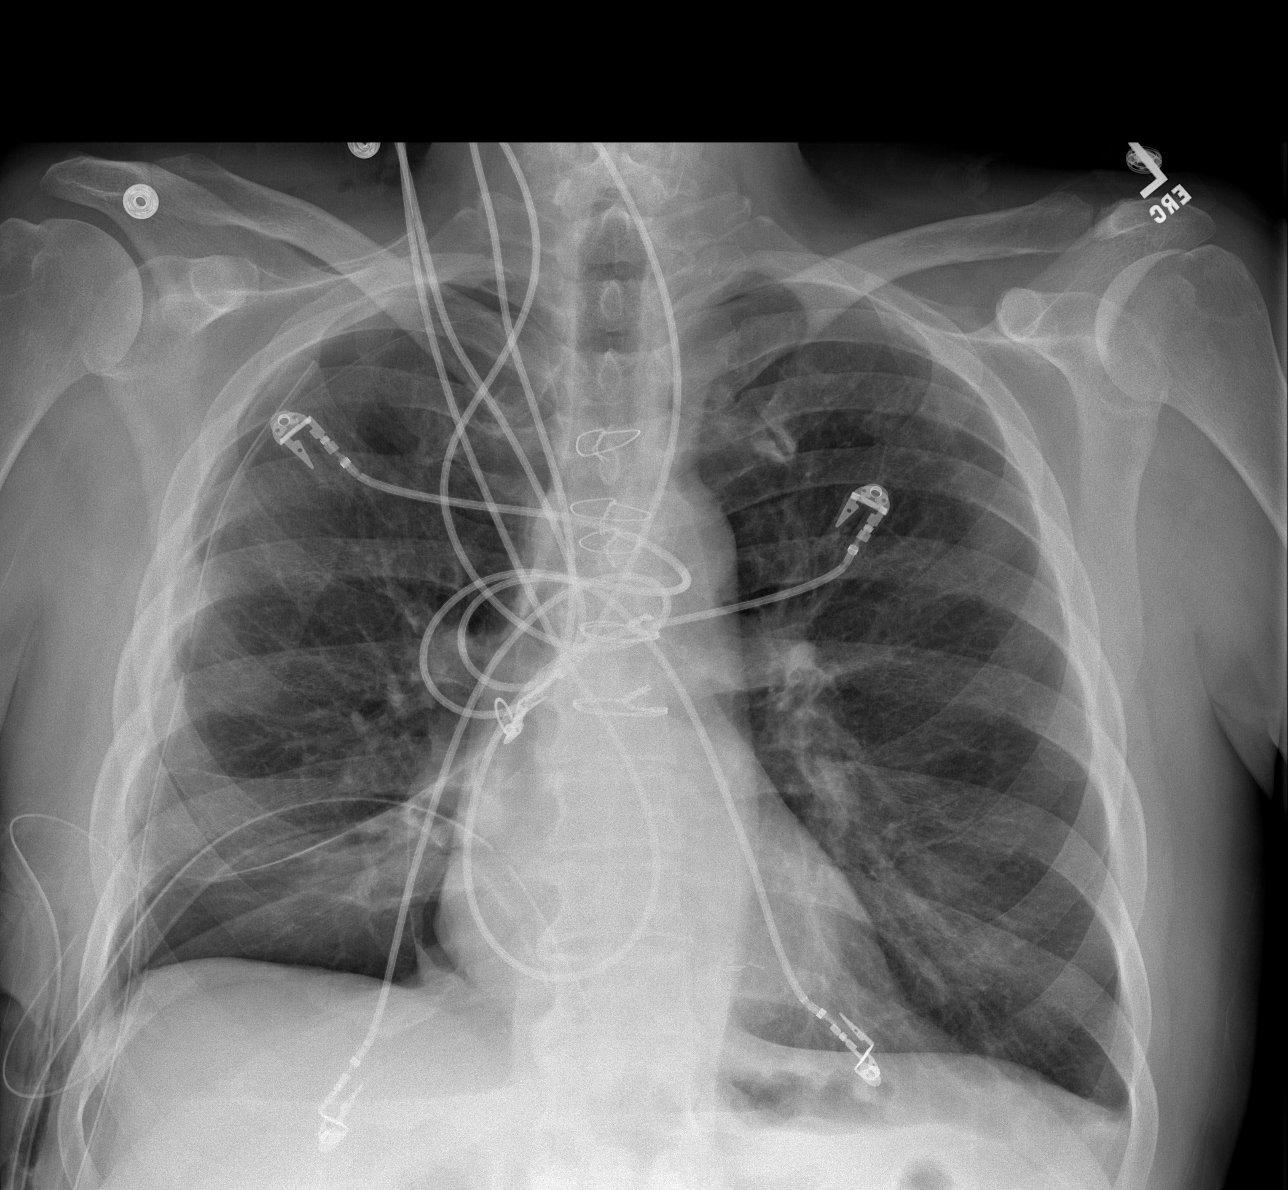
[im 2/3]
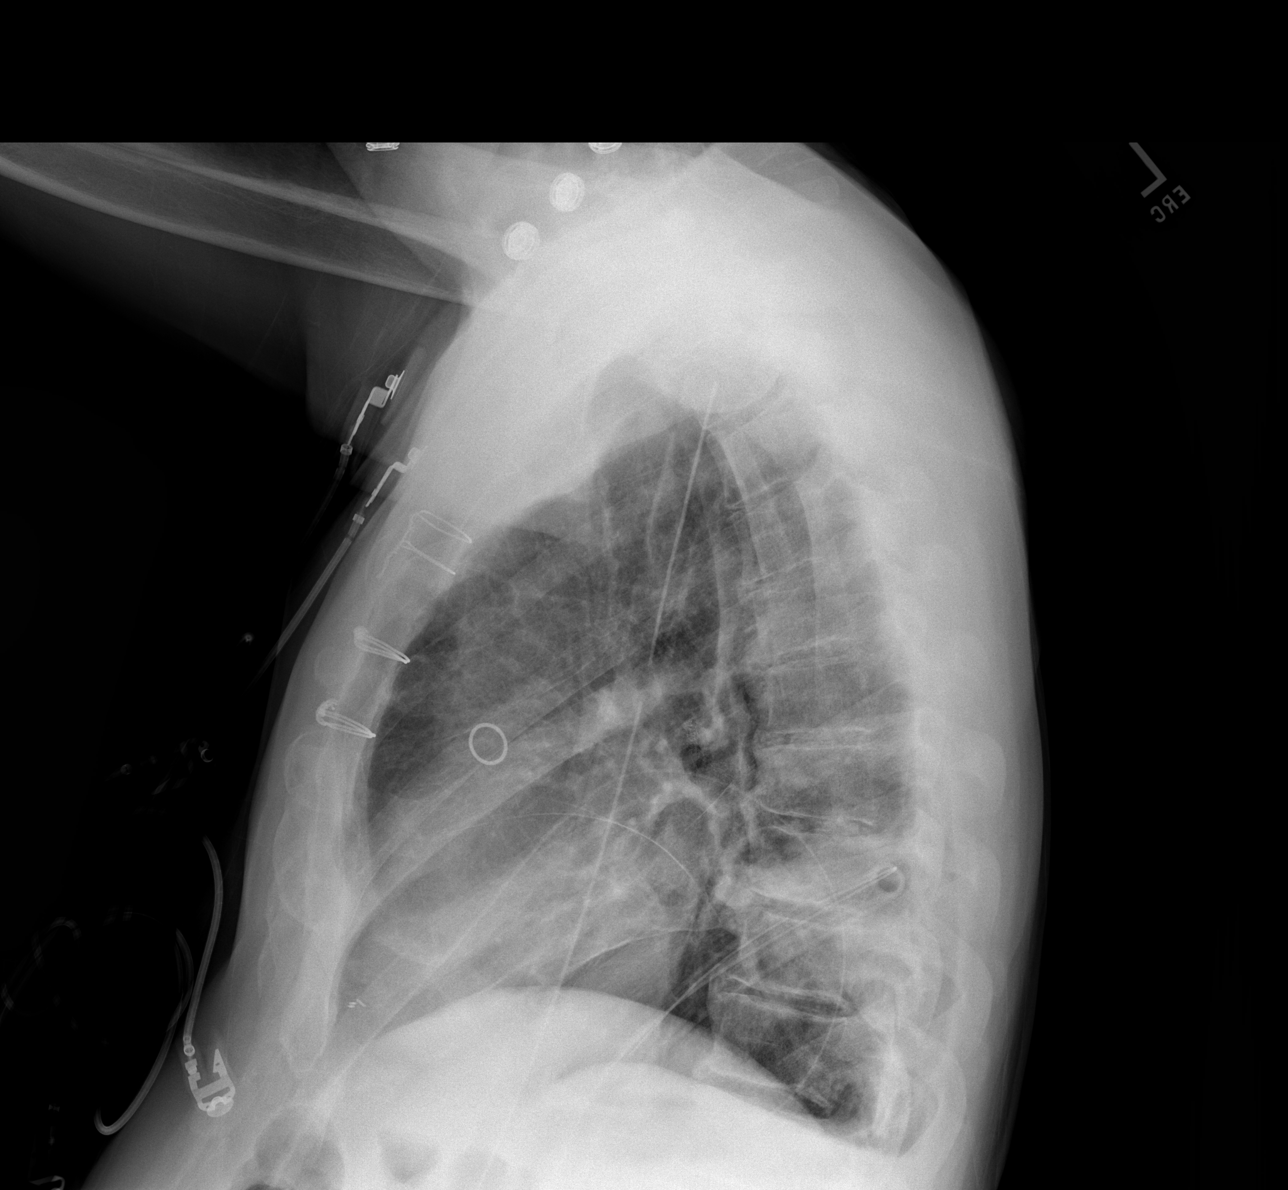
[im 3/3]
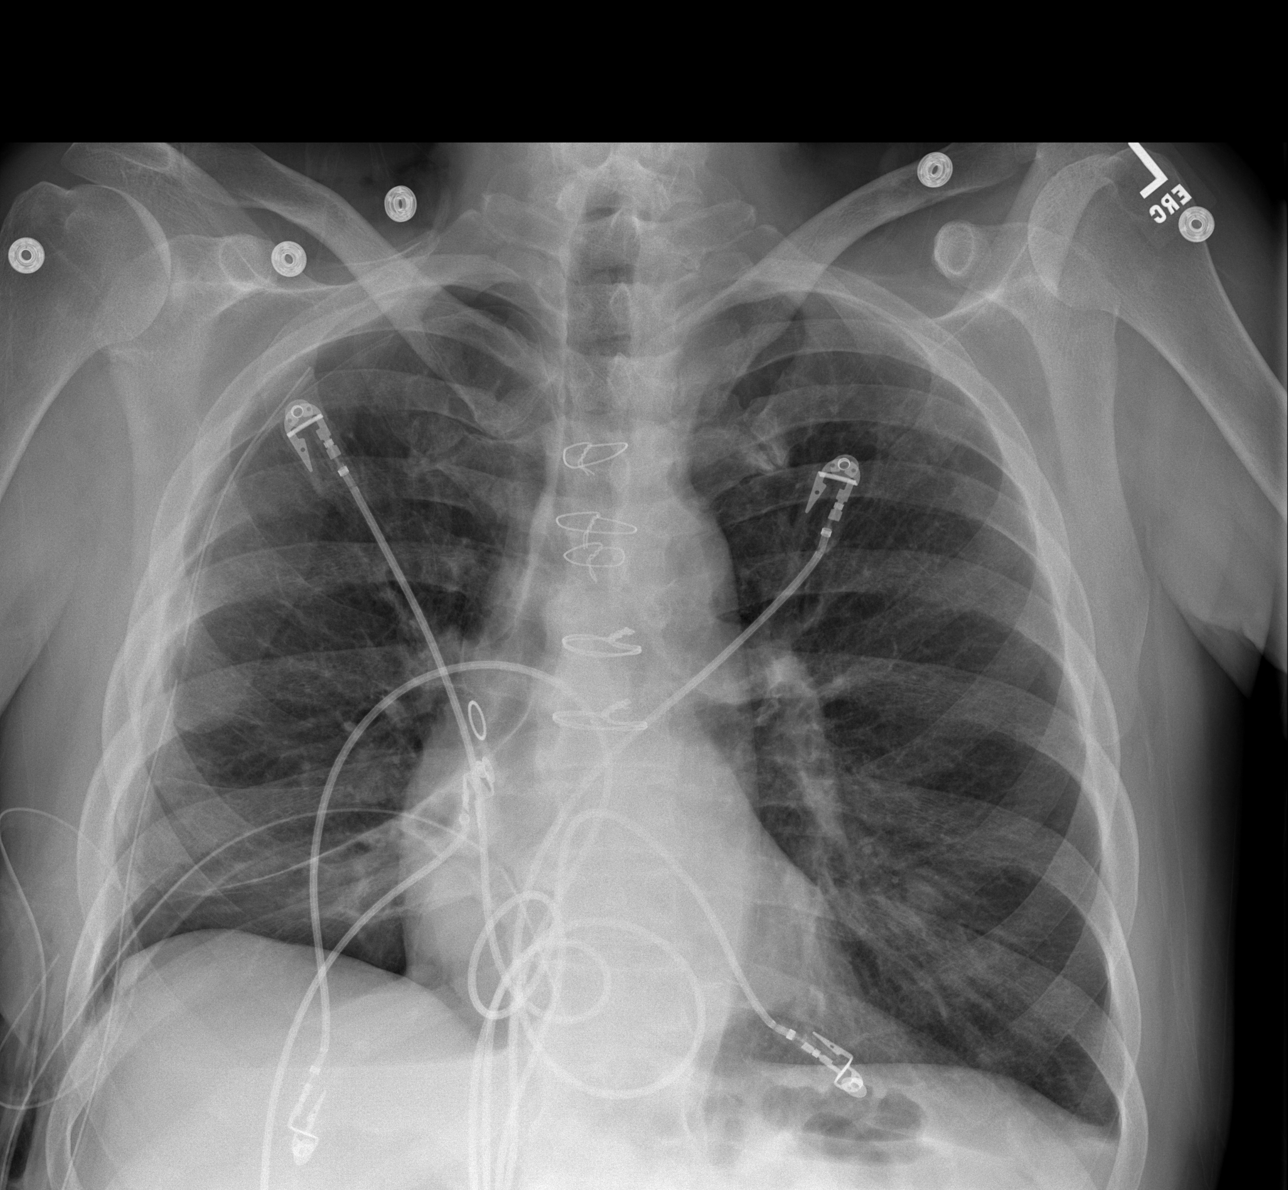

[3 of 3 positions shown; findings below may reference images not displayed]

PROCEDURE:     DXR - DXR CHEST PA (OR AP) AND LATERAL  - [DATE]  [DATE]

RESULT:     Is made to the previous exam dated [DATE].

Cardiac monitoring electrodes are present. There appears to be some
increased density at the right lung base. The cardiac silhouette is normal
in size. Sternotomy wires are present. Right-sided chest tubes are present.
Small right-sided pneumothorax is demonstrated. No large effusion is
evident. The left lung appears clear and fully inflated. CABG markers are
present.
IMPRESSION: 1. Two right-sided chest tubes are present. There is a small right-sided
pneumothorax. Right lung base atelectasis appears present.

[REDACTED]

## 2011-10-18 IMAGING — CR DG CHEST 1V PORT
1 series · 1 of 1 positions shown · non-contrast
Comparison: none

REASON FOR EXAM: assess for pleural effusion
COMMENTS:

PROCEDURE:     DXR - DXR PORTABLE CHEST SINGLE VIEW  - [DATE]  [DATE]
RESULT:     A right chest tube is noted. No evidence of pneumothorax. Prior
CABG is noted. The lungs are clear. The cardiovascular structures are
unremarkable.

[portable]
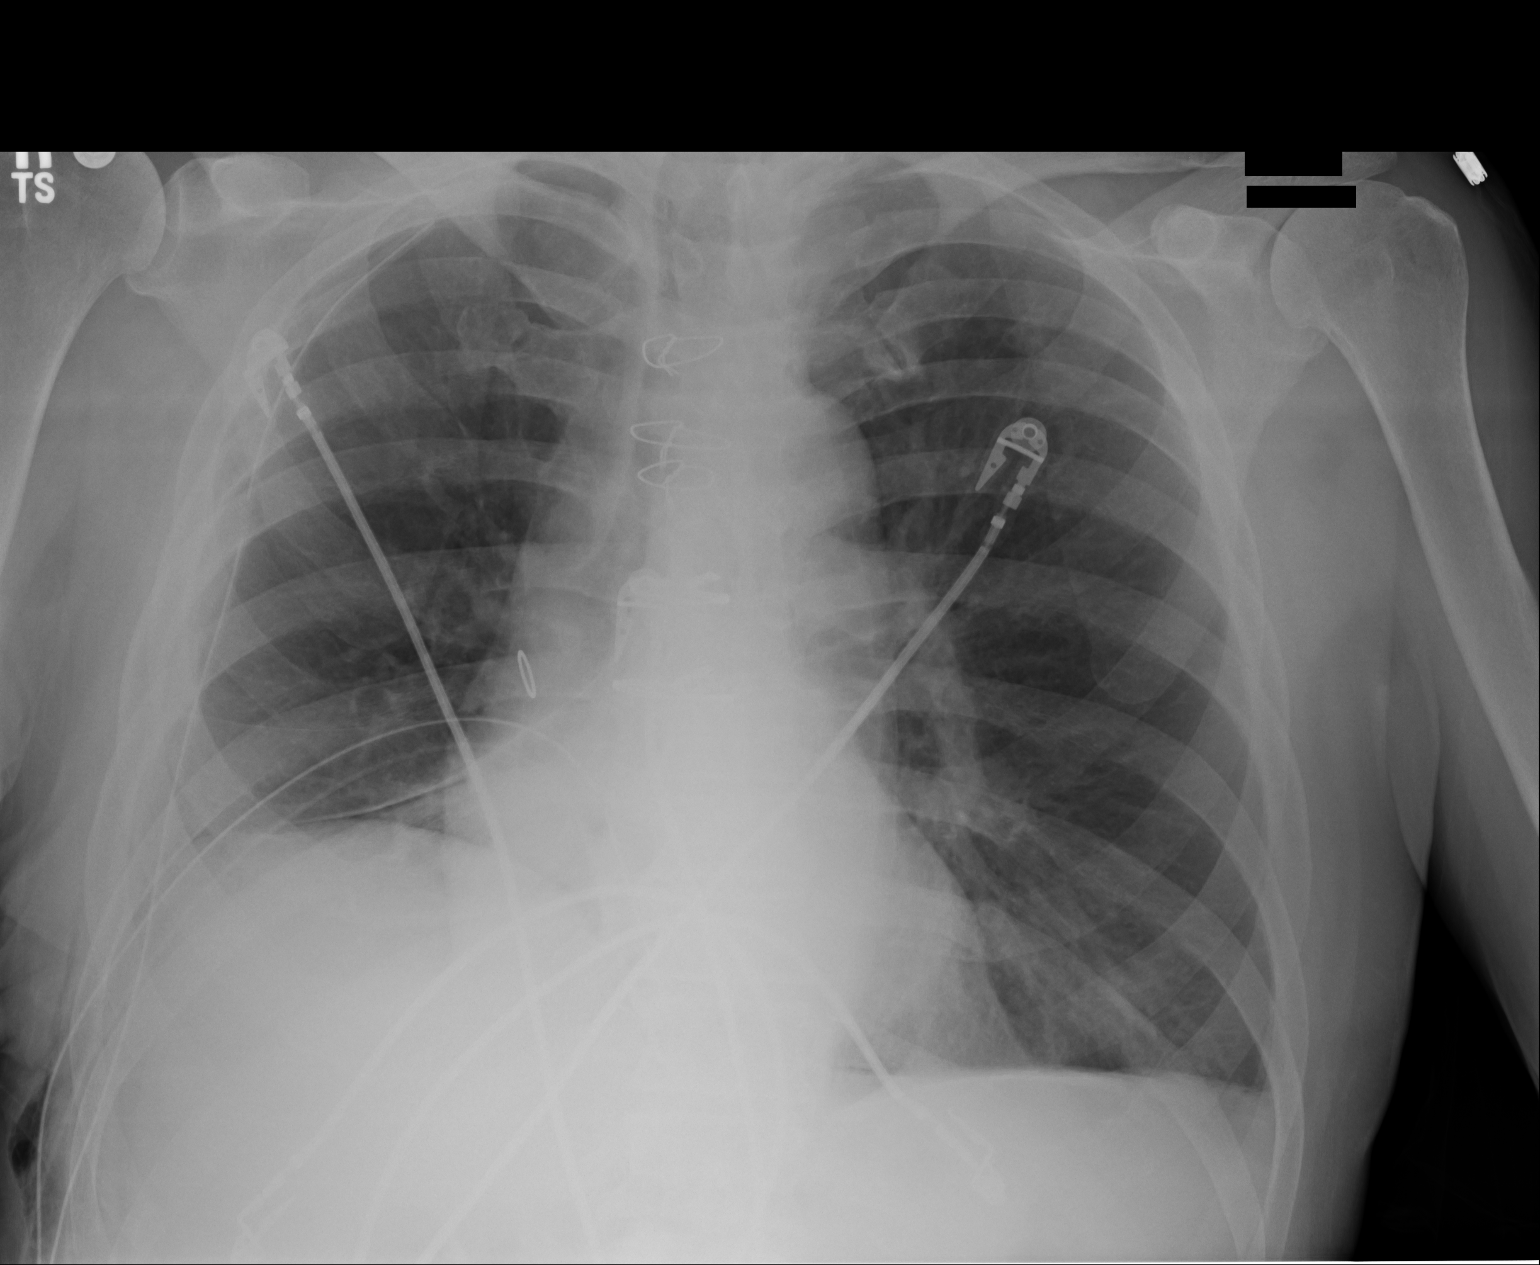

[1 of 1 positions shown; findings below may reference images not displayed]

IMPRESSION: Stable chest with chest tube in stable position. Minimal
apical pneumothorax may be present on today's examination. Pneumothorax has
improved from [DATE].

## 2011-10-19 LAB — CBC WITH DIFFERENTIAL/PLATELET
Basophil %: 0.4 %
Eosinophil #: 0.5 10*3/uL (ref 0.0–0.7)
HGB: 11 g/dL — ABNORMAL LOW (ref 13.0–18.0)
Lymphocyte #: 2.2 10*3/uL (ref 1.0–3.6)
MCH: 25.9 pg — ABNORMAL LOW (ref 26.0–34.0)
MCHC: 31.5 g/dL — ABNORMAL LOW (ref 32.0–36.0)
MCV: 82 fL (ref 80–100)
Neutrophil #: 11 10*3/uL — ABNORMAL HIGH (ref 1.4–6.5)
Neutrophil %: 73.3 %
WBC: 15.1 10*3/uL — ABNORMAL HIGH (ref 3.8–10.6)

## 2011-10-19 LAB — BASIC METABOLIC PANEL
Anion Gap: 10 (ref 7–16)
Chloride: 105 mmol/L (ref 98–107)
Co2: 26 mmol/L (ref 21–32)
EGFR (African American): 58 — ABNORMAL LOW
EGFR (Non-African Amer.): 50 — ABNORMAL LOW
Osmolality: 284 (ref 275–301)
Sodium: 141 mmol/L (ref 136–145)

## 2011-10-19 IMAGING — CR DG CHEST 1V PORT
1 series · 1 of 1 positions shown · non-contrast
Comparison: none

REASON FOR EXAM: Assess for Pleural Effusion
COMMENTS:

PROCEDURE:     DXR - DXR PORTABLE CHEST SINGLE VIEW  - [DATE]  [DATE]
RESULT:     Comparison: [DATE]

[ap]
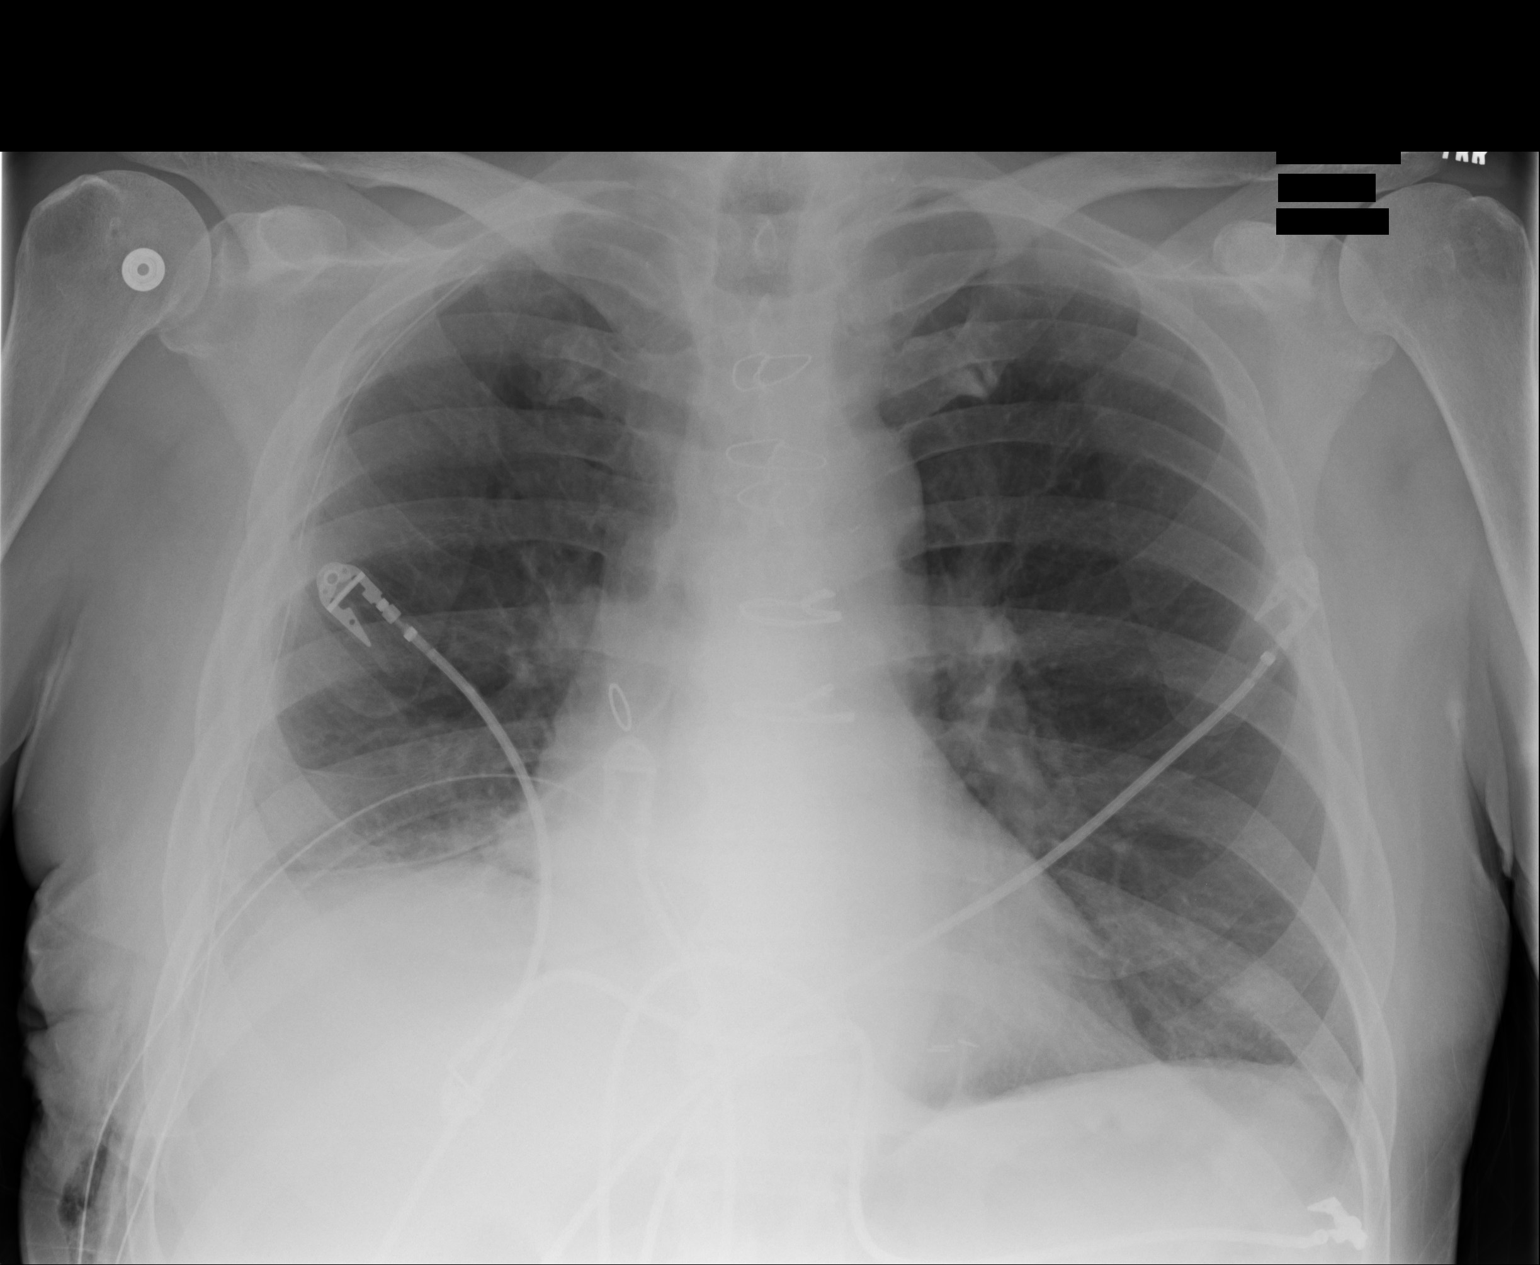

[1 of 1 positions shown; findings below may reference images not displayed]

FINDINGS: Heart and mediastinum are stable. Right-sided chest tubes remain in place.
No definite pneumothorax seen. Mild right basilar opacities are similar to
prior and likely secondary to atelectasis. The left lung is clear.
IMPRESSION: Unchanged chest radiograph, with mild right basilar opacities that likely
represent atelectasis.

## 2011-10-20 IMAGING — CR DG CHEST 1V PORT
1 series · 1 of 1 positions shown · non-contrast
Comparison: none

REASON FOR EXAM: Assess for Pleural Effusion
COMMENTS:

PROCEDURE:     DXR - DXR PORTABLE CHEST SINGLE VIEW  - [DATE]  [DATE]
RESULT:     Comparison: [DATE], [DATE]

[portable]
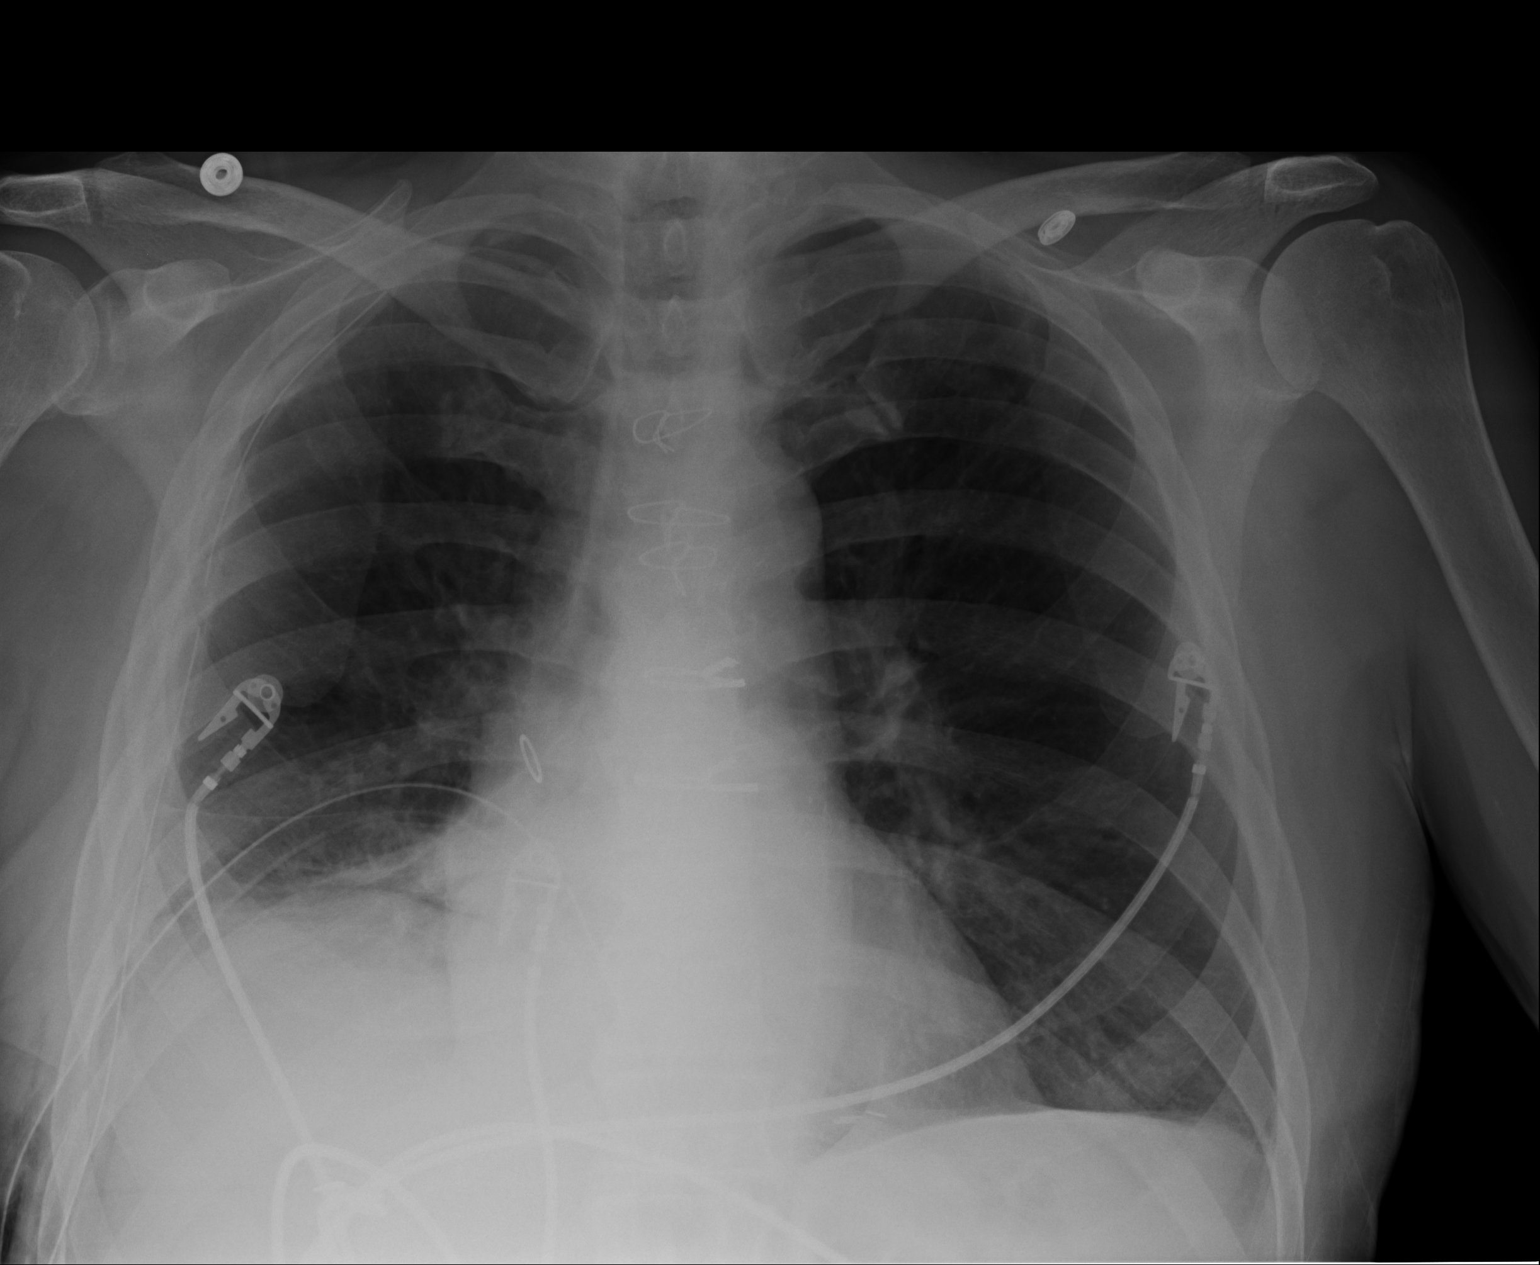

[1 of 1 positions shown; findings below may reference images not displayed]

FINDINGS: Single portable AP chest radiograph is provided. There are 2 right-sided
chest tubes in satisfactory position. There is no definite pneumothorax.
There is right basilar atelectasis. There is elevation of the right
diaphragm. There is no focal parenchymal opacity, pleural effusion, or
pneumothorax. Normal cardiomediastinal silhouette. The osseous structures
are unremarkable.
IMPRESSION: No significant interval change compared with the prior exam.

[REDACTED]

## 2011-10-21 IMAGING — CR DG CHEST 1V PORT
1 series · 1 of 1 positions shown · non-contrast
Comparison: none

REASON FOR EXAM: Assess for Pleural Effusion
COMMENTS:

PROCEDURE:     DXR - DXR PORTABLE CHEST SINGLE VIEW  - [DATE]  [DATE]
RESULT:     Comparison: [DATE], [DATE]

[ap]
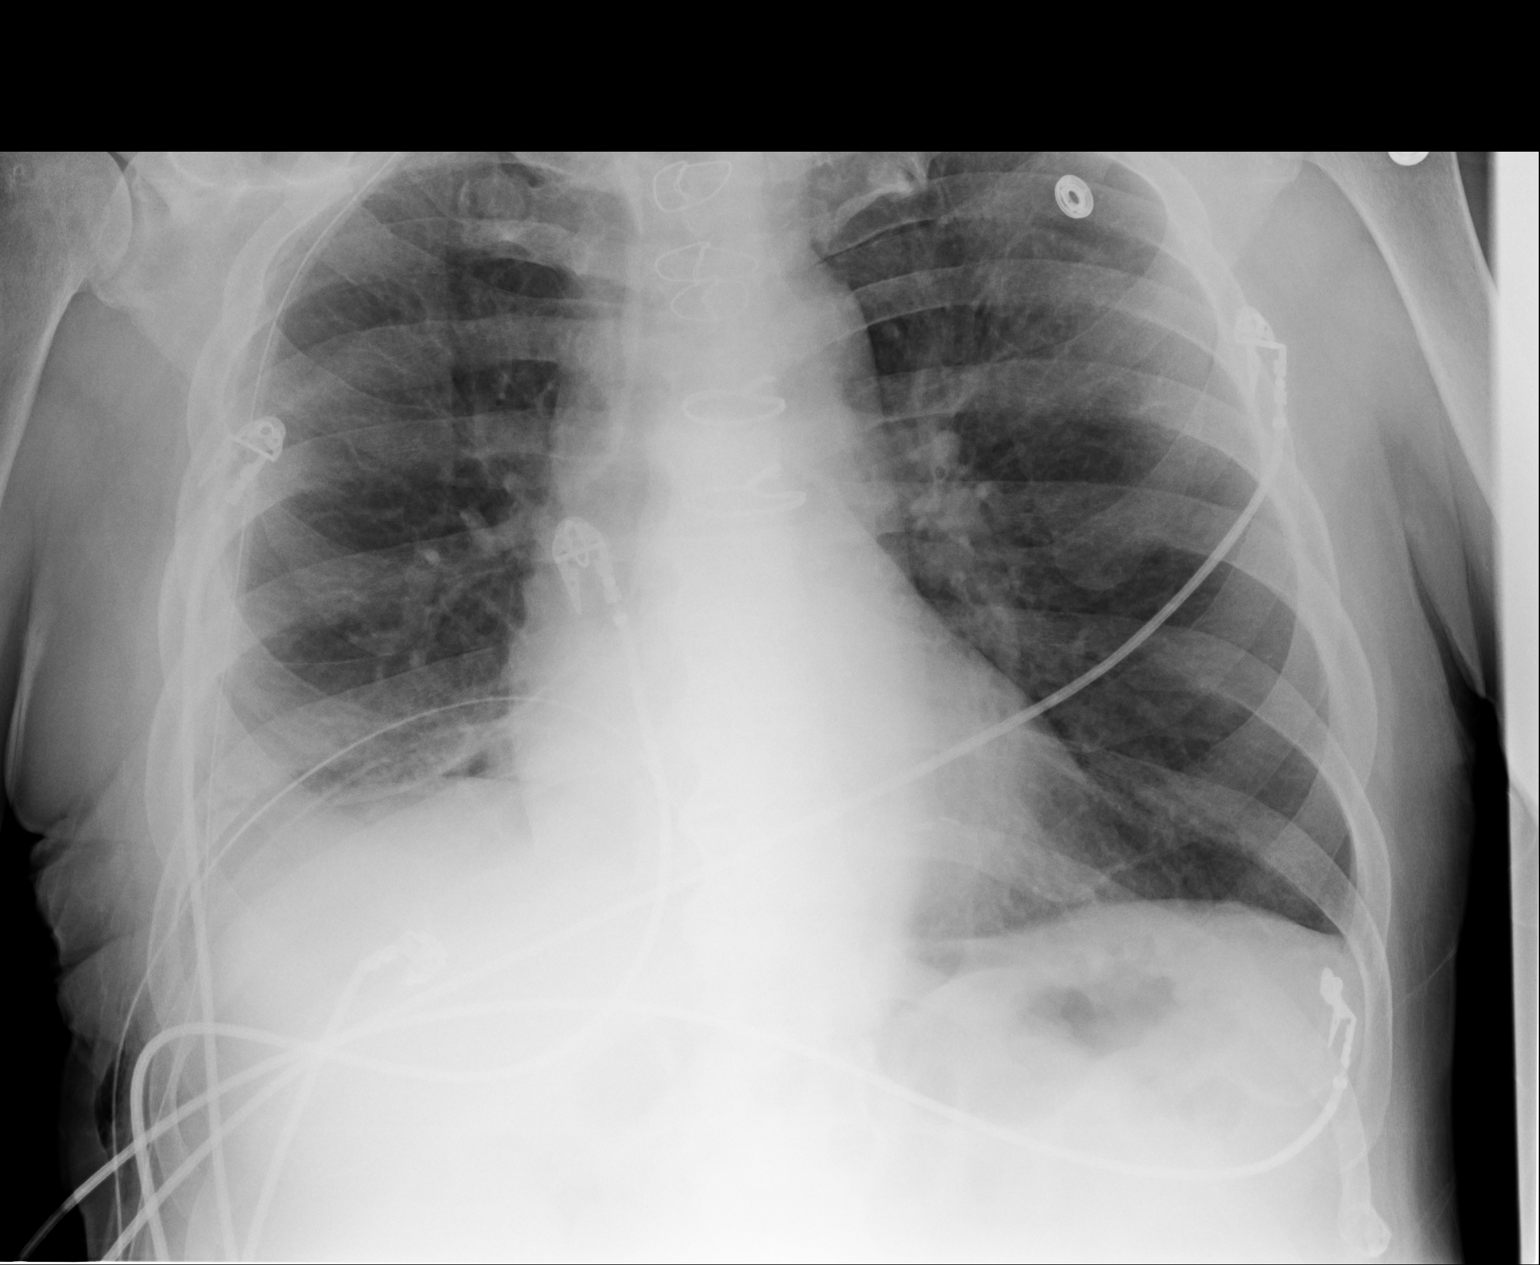

[1 of 1 positions shown; findings below may reference images not displayed]

FINDINGS: Single portable AP chest radiograph is provided. There are 2 right-sided
chest tubes in satisfactory position. There is no definite pneumothorax.
There is right basilar atelectasis. There is elevation of the right
diaphragm. There is a trace right pleural effusion. There is likely right
basilar atelectasis. There is no left pleural effusion. There is a left
focal parenchymal opacity. Normal cardiomediastinal silhouette. The osseous
structures are unremarkable.
IMPRESSION: 2 right-sided chest tubes in satisfactory position without pneumothorax.
Trace right pleural effusion.

[REDACTED]

## 2011-10-22 IMAGING — CR DG CHEST 2V
1 series · 3 of 3 positions shown · non-contrast
Comparison: none

REASON FOR EXAM: access for pleural effusion
COMMENTS:

[Series 11: w chest pa · 0.14mm/px · 3 of 3 slices shown]
[im 1/3]
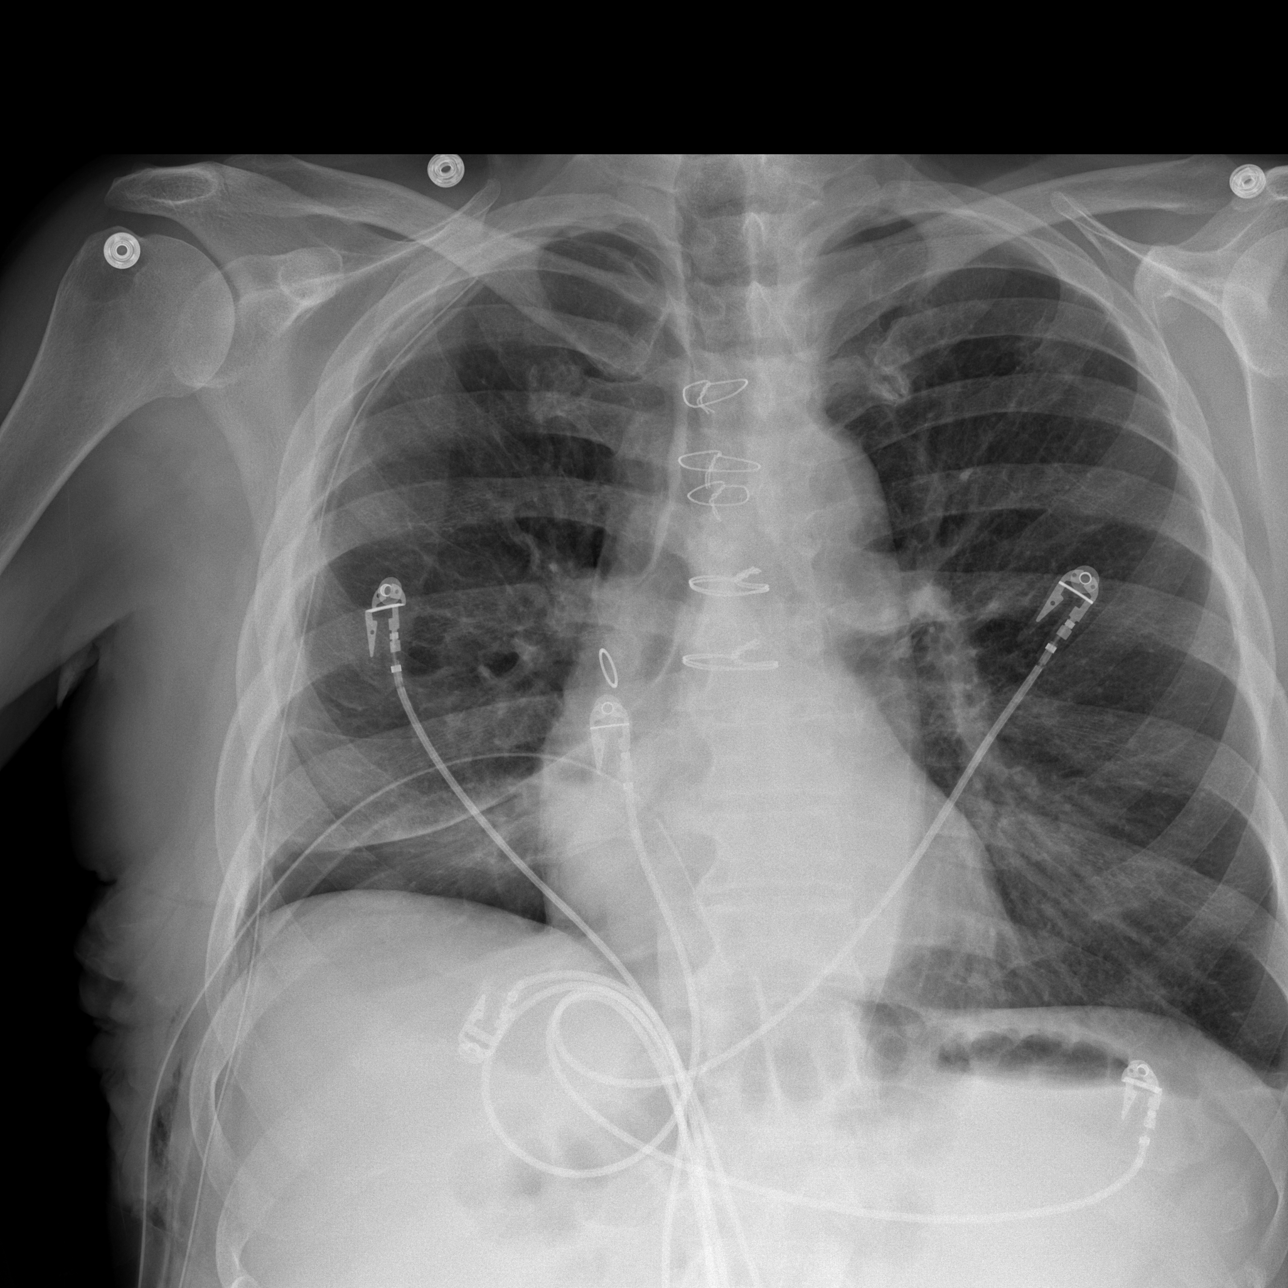
[im 2/3]
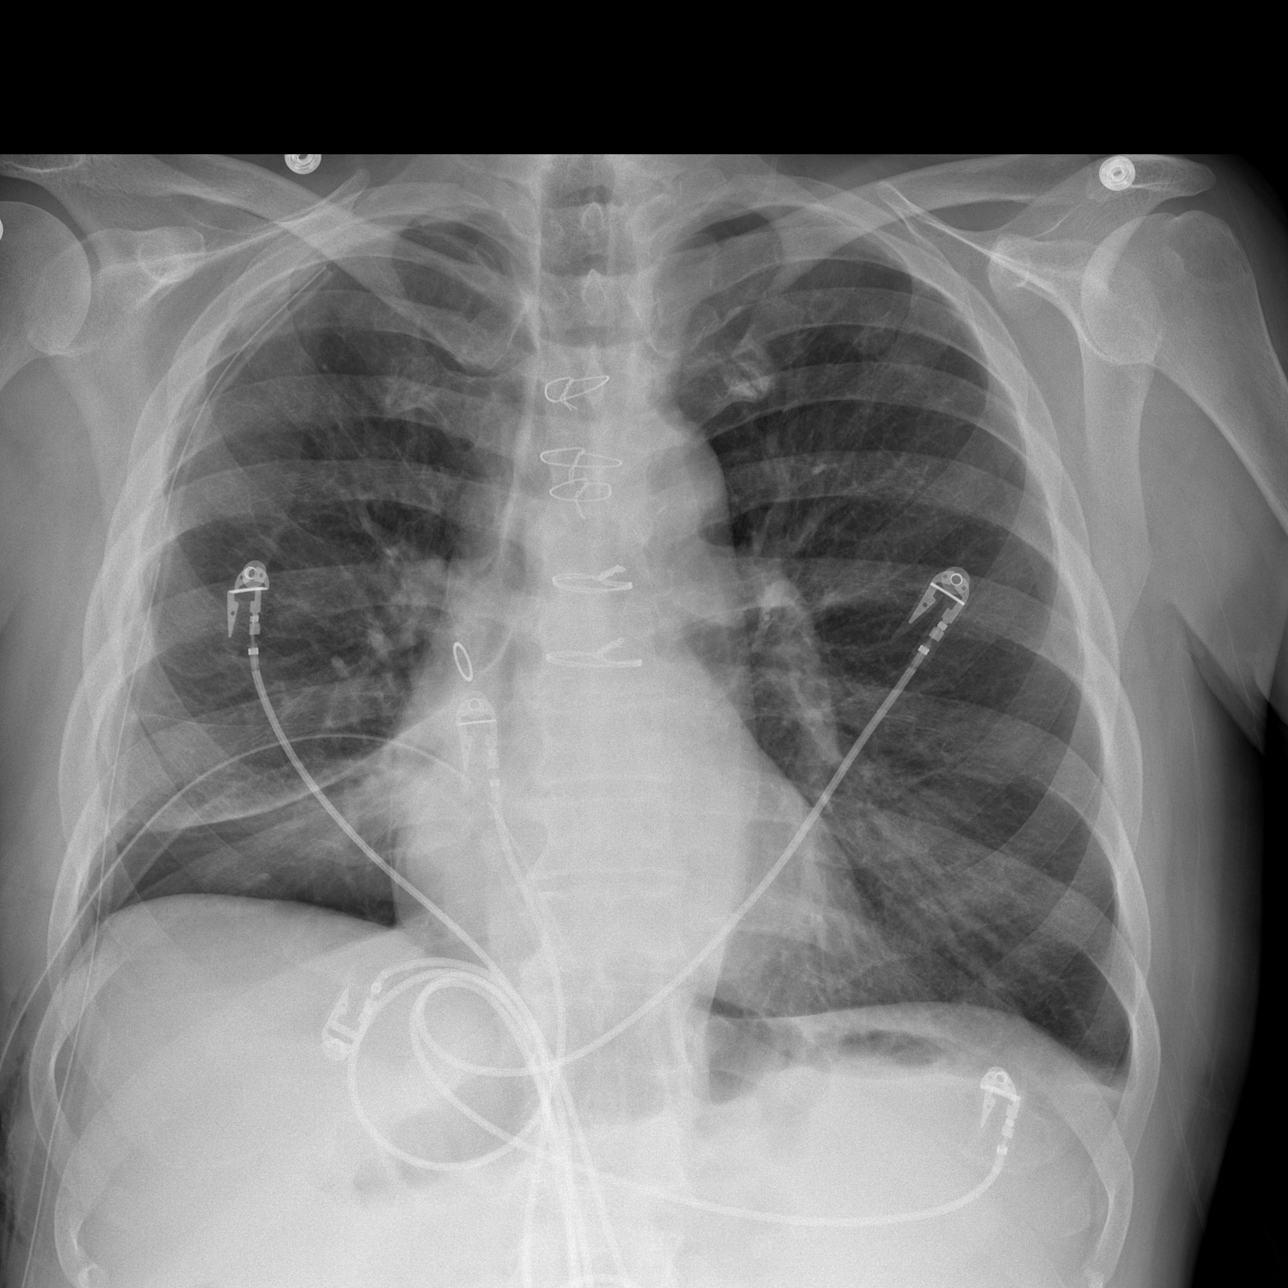
[im 3/3]
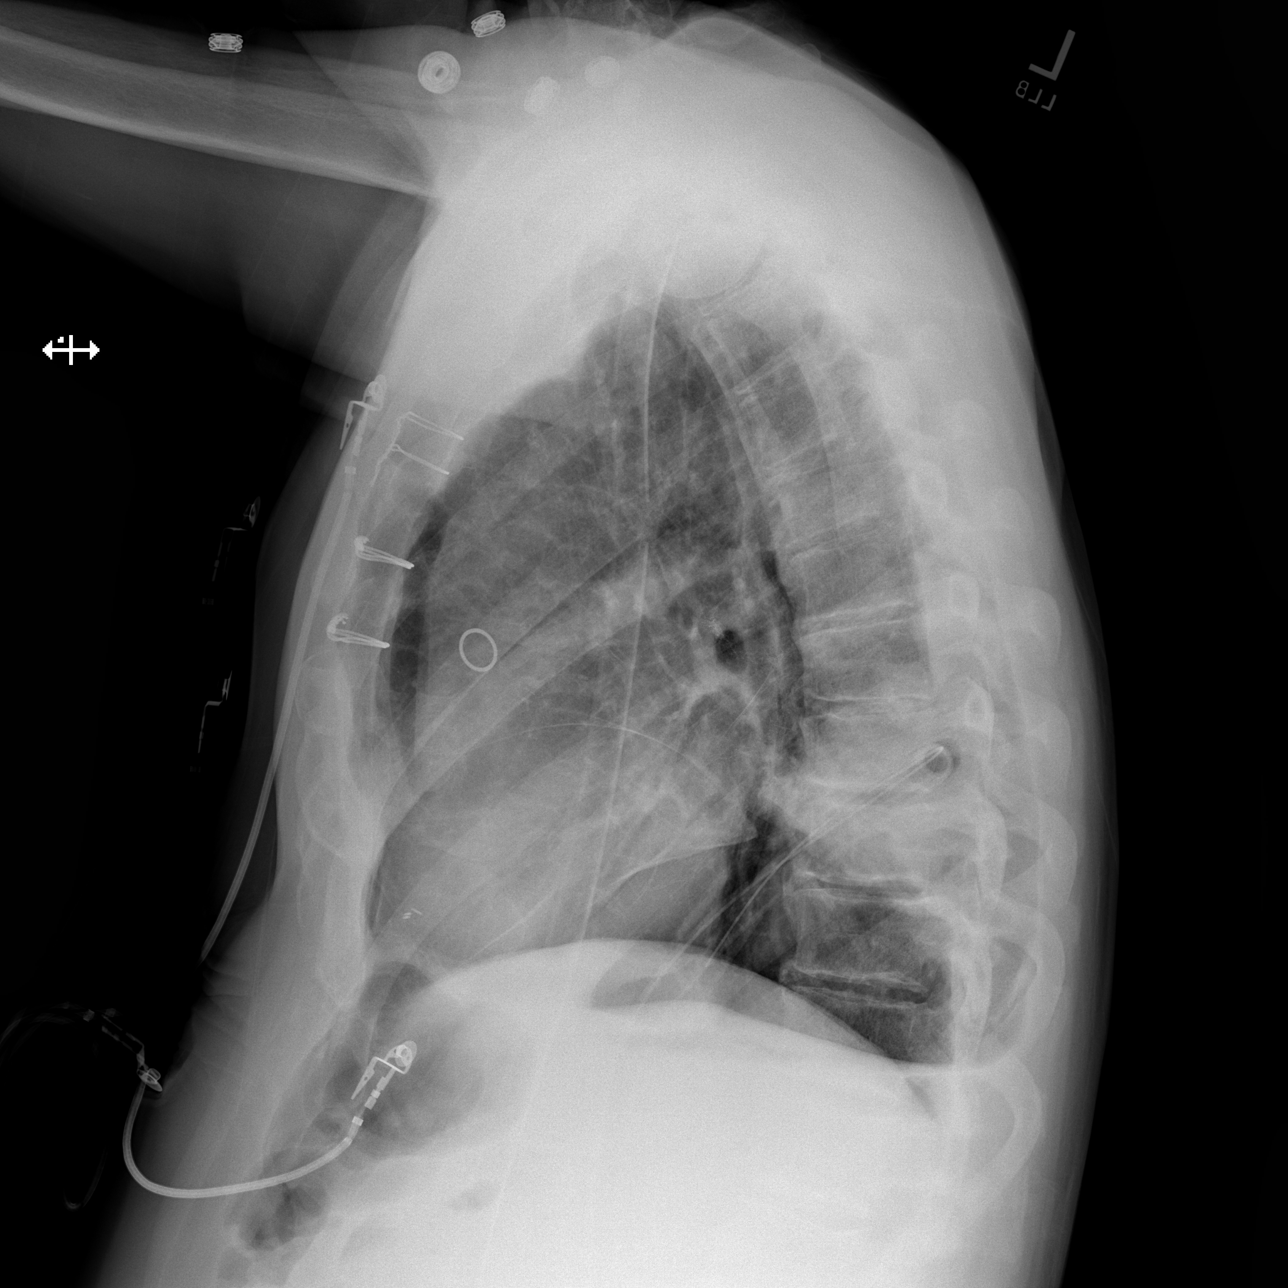

[3 of 3 positions shown; findings below may reference images not displayed]

PROCEDURE:     DXR - DXR CHEST PA (OR AP) AND LATERAL  - [DATE]  [DATE]

RESULT:     Comparison is made to the prior exam of [DATE]. Two right
chest tubes are present. There is slight thickening of the pleural shadow at
the right apex compatible with pleural reactive change or a trace of fluid.
This has been present previously. No pleural effusion is seen at the right
costophrenic angle. In the lateral view, there is a pleural-based triangular
density compatible with atelectasis, pneumonia or pulmonary infarction. The
left lung field is clear. Heart size is normal.
IMPRESSION: 1. Multiple right chest tubes are noted.
2. No pneumothorax is seen.
3. There is slight thickening of the pleural shadow at the right apex
compatible with pleural reactive change or a trace effusion.
4. There is a triangular density posteriorly in the right chest compatible
with pneumonia, atelectasis or pulmonary infarction.

## 2011-10-23 IMAGING — CR DG CHEST 2V
1 series · 2 of 2 positions shown · non-contrast
Comparison: none

REASON FOR EXAM: access for pleural effusion
COMMENTS:

[Series 14: w chest pa · 0.14mm/px · 2 of 2 slices shown]
[im 1/2]
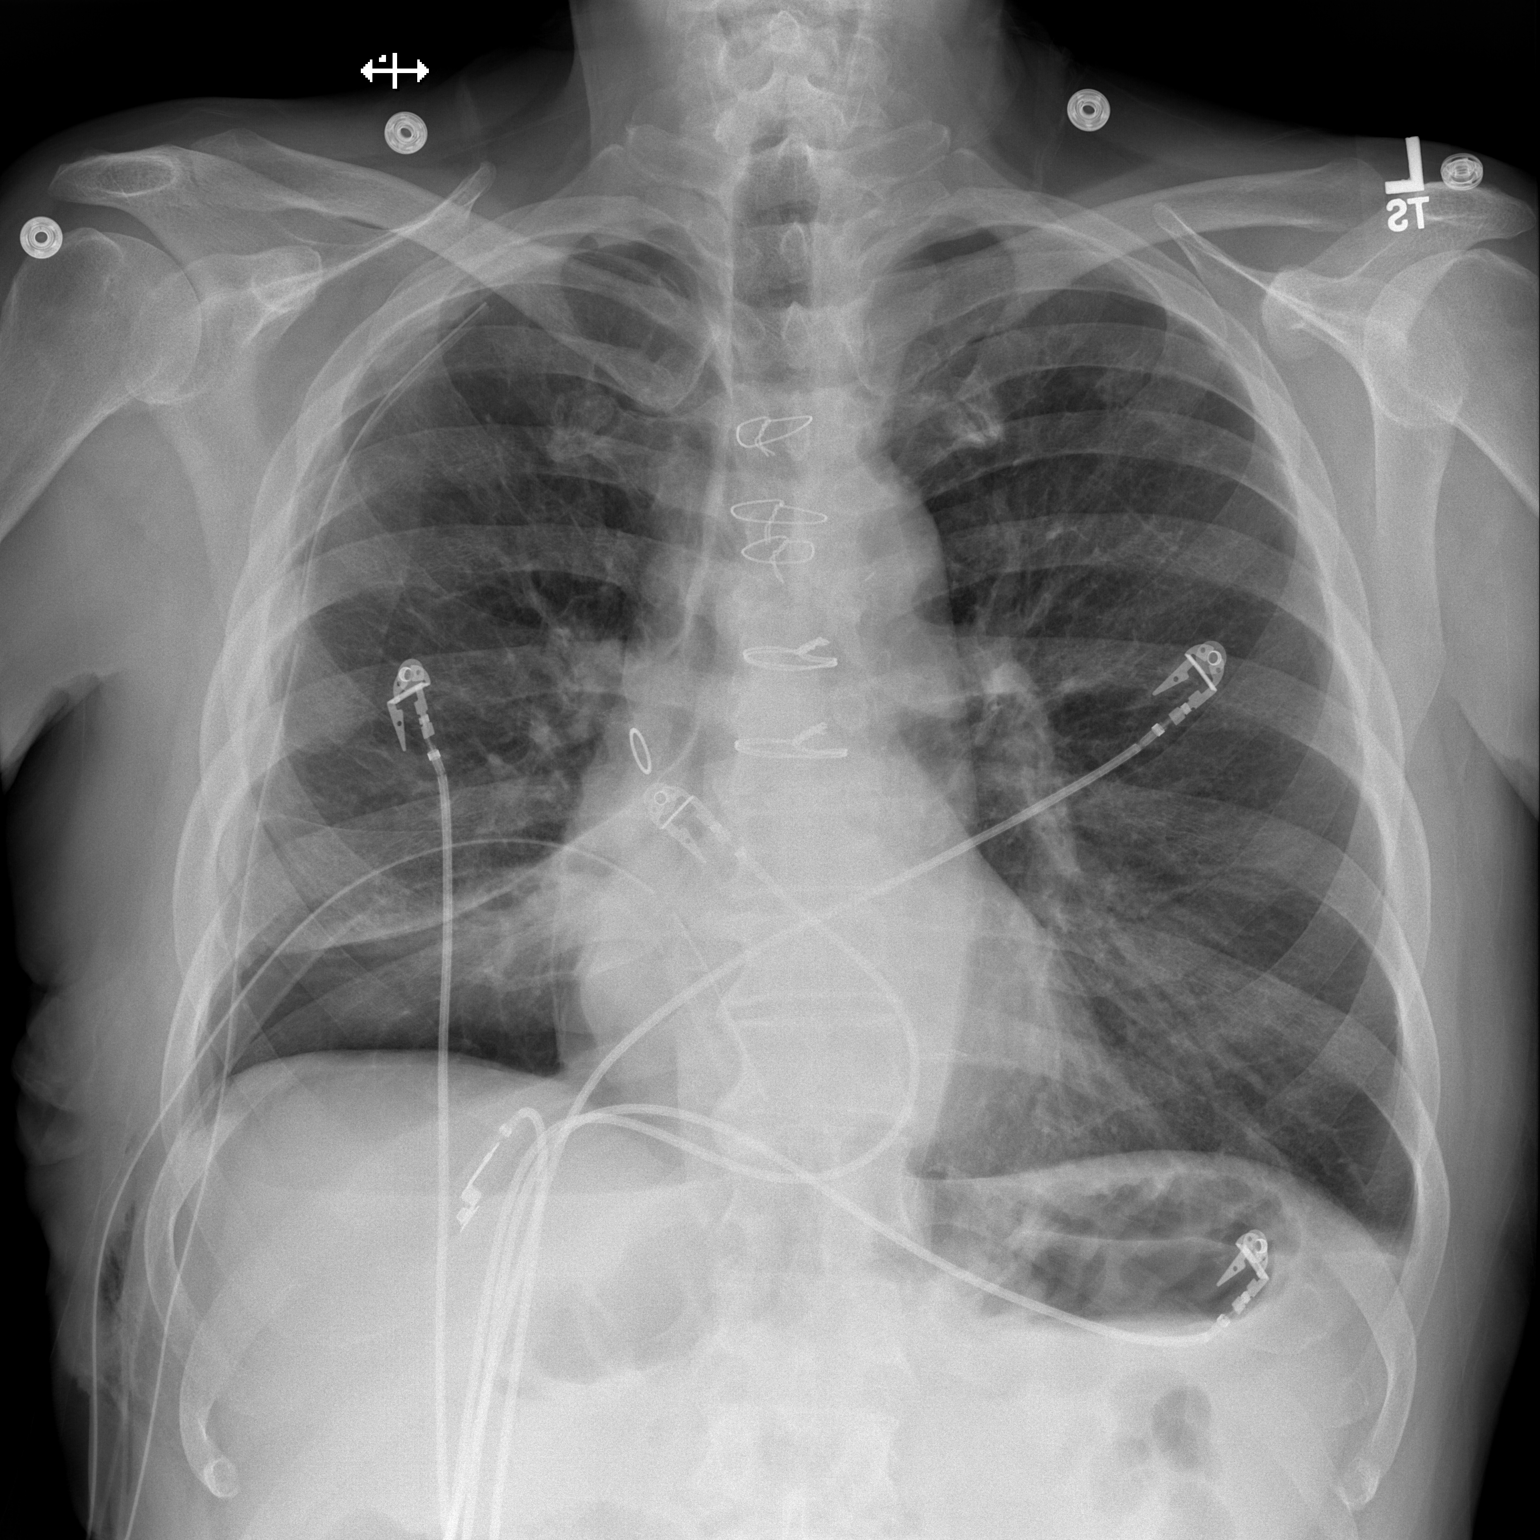
[im 2/2]
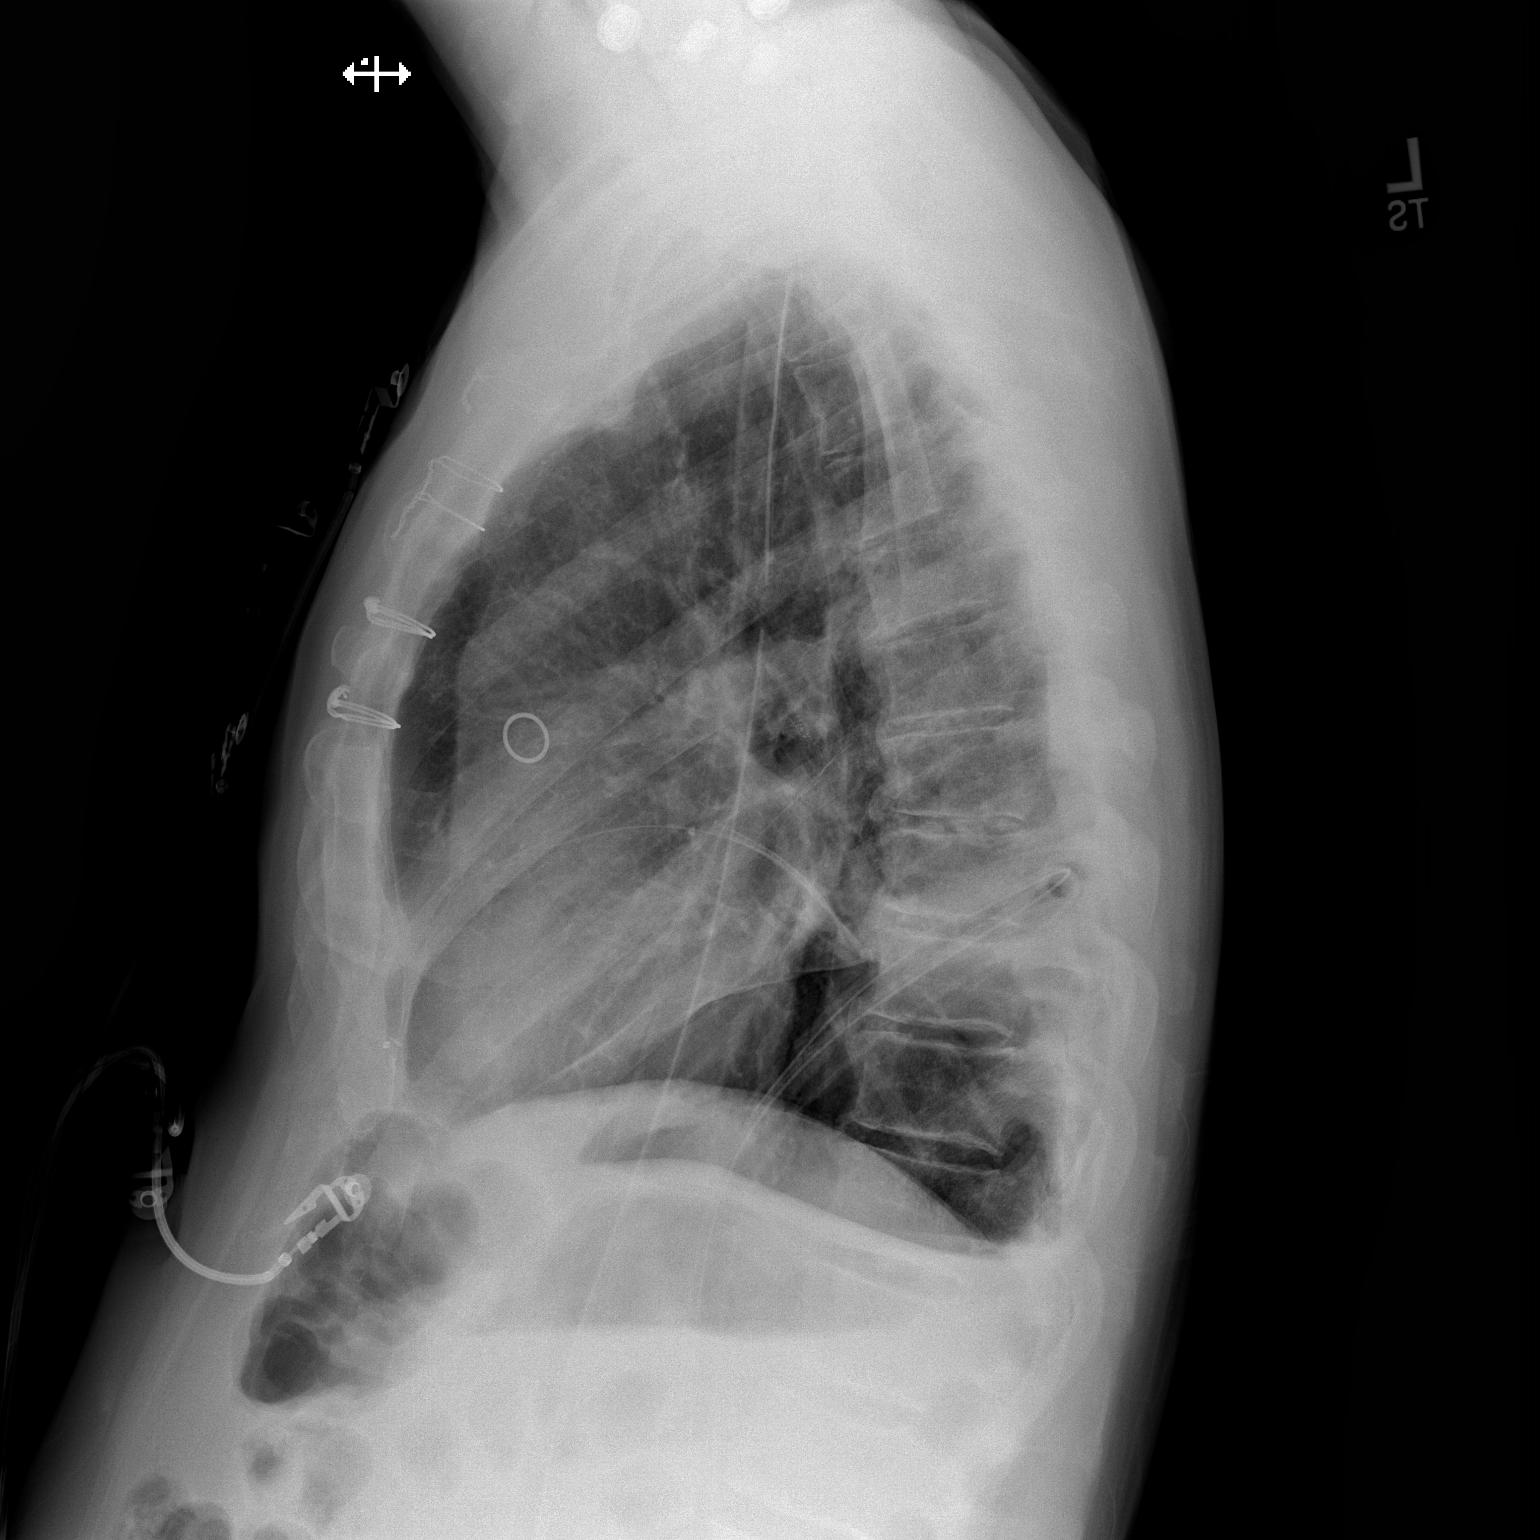

[2 of 2 positions shown; findings below may reference images not displayed]

PROCEDURE:     DXR - DXR CHEST PA (OR AP) AND LATERAL  - [DATE]  [DATE]

RESULT:     Comparison is made to the prior exam of [DATE]. Multiple
right chest tubes are again seen. No definite pneumothorax is observed.
There is again noted a pleural-based triangular infiltrate posteriorly in
the right base. There has been little or no appreciable interval change as
compared to the exam of yesterday. No new pulmonary infiltrates are seen.
Monitoring electrodes are present. There is trace subcutaneous emphysema
along the inferior aspect of the right hemithorax.
IMPRESSION: 1. Two right chest tubes are again seen.
2. No pneumothorax is seen.
3. There persists an infiltrate posteriorly in the right base.
4. No new pulmonary infiltrates are seen.
5. Heart size is normal.

## 2011-10-24 ENCOUNTER — Ambulatory Visit: Payer: Self-pay | Admitting: Internal Medicine

## 2011-10-24 ENCOUNTER — Ambulatory Visit: Payer: Self-pay | Admitting: Oncology

## 2011-10-25 IMAGING — CR DG CHEST 2V
1 series · 2 of 2 positions shown · non-contrast
Comparison: none

REASON FOR EXAM: S/P chest  clamping
COMMENTS:

[Series 1: pa · 0.17mm/px · 2 of 2 slices shown]
[im 1/2]
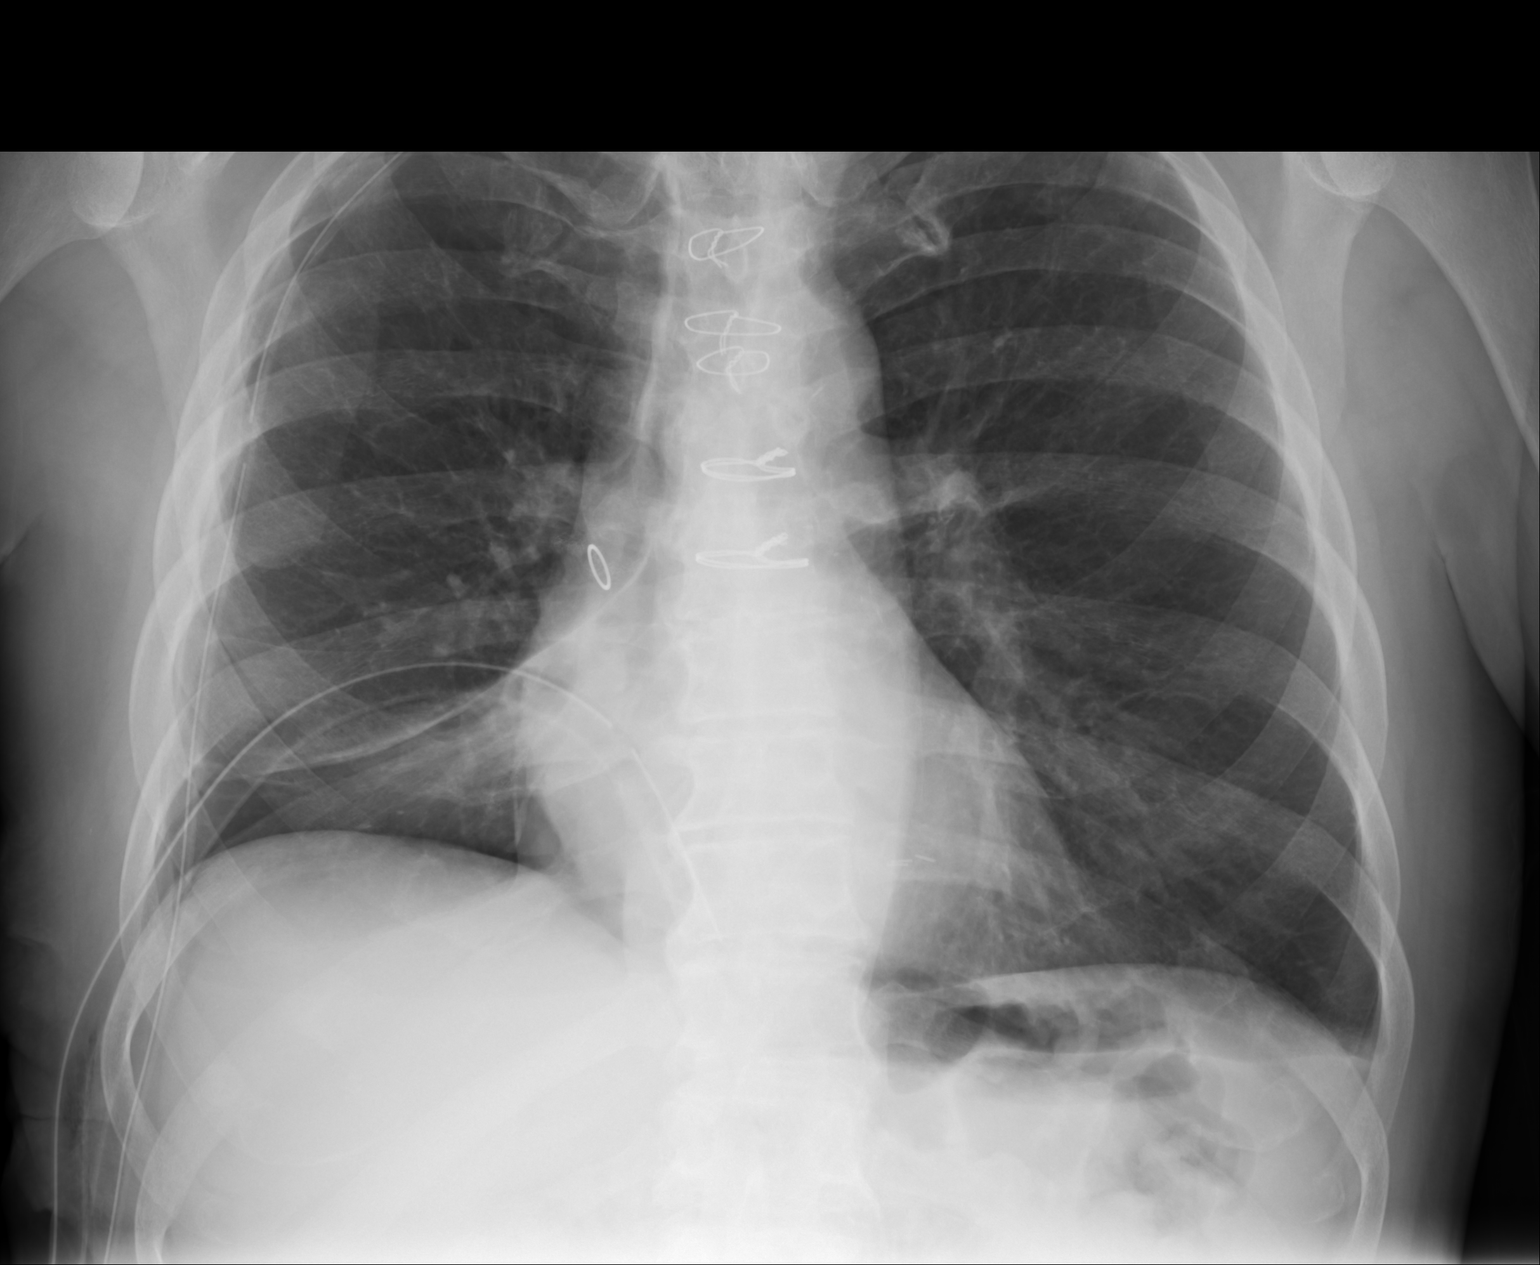
[im 2/2]
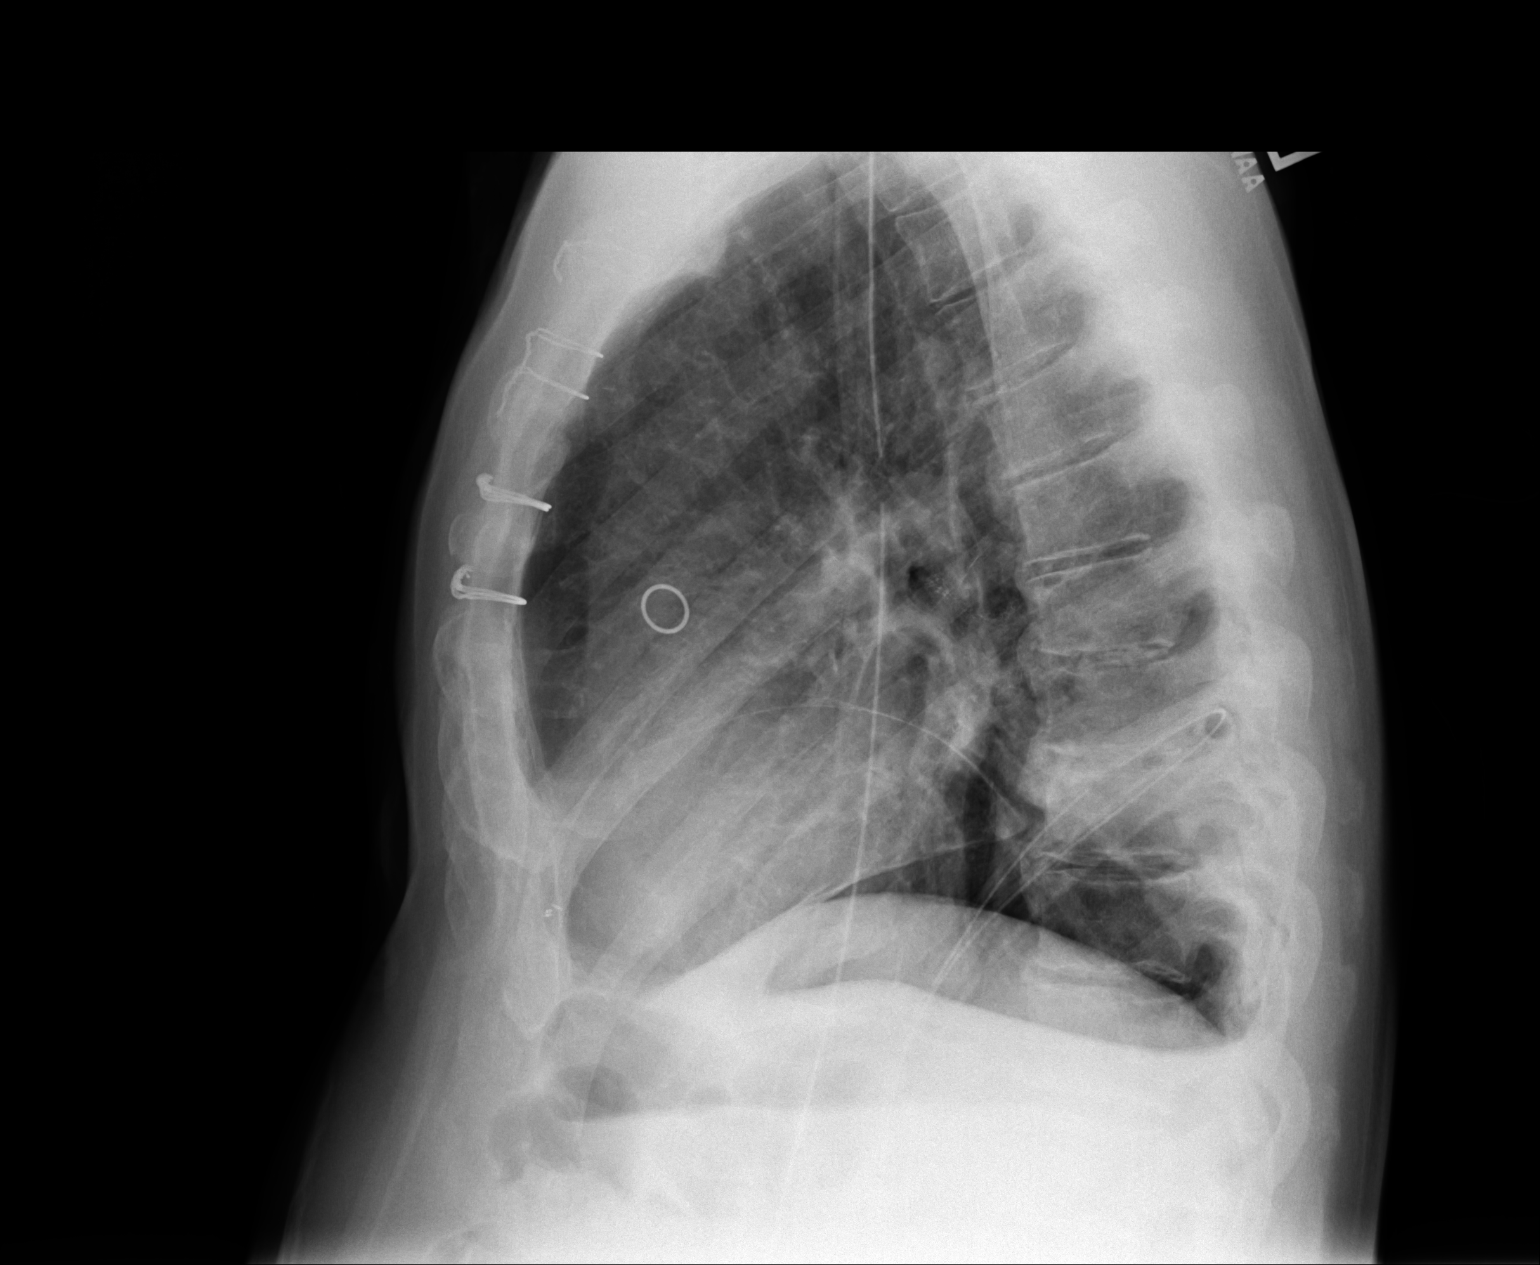

[2 of 2 positions shown; findings below may reference images not displayed]

PROCEDURE:     DXR - DXR CHEST PA (OR AP) AND LATERAL  - [DATE]  [DATE]

RESULT:     Comparison is made to the prior exam of earlier this date. Two
chest tubes remain present. No pneumothorax is seen. The previously noted
density posteriorly in the right chest remains stable. There is again noted
blunting of the left costophrenic angle consistent with a trace left
effusion. Heart size is normal.
IMPRESSION: 1.     Two chest tubes remain present on the right. No pneumothorax is seen.

## 2011-10-26 IMAGING — CR DG CHEST 2V
1 series · 2 of 2 positions shown · non-contrast
Comparison: none

REASON FOR EXAM: chest tube
COMMENTS:

[Series 16: w chest pa · 0.14mm/px · 2 of 2 slices shown]
[im 1/2]
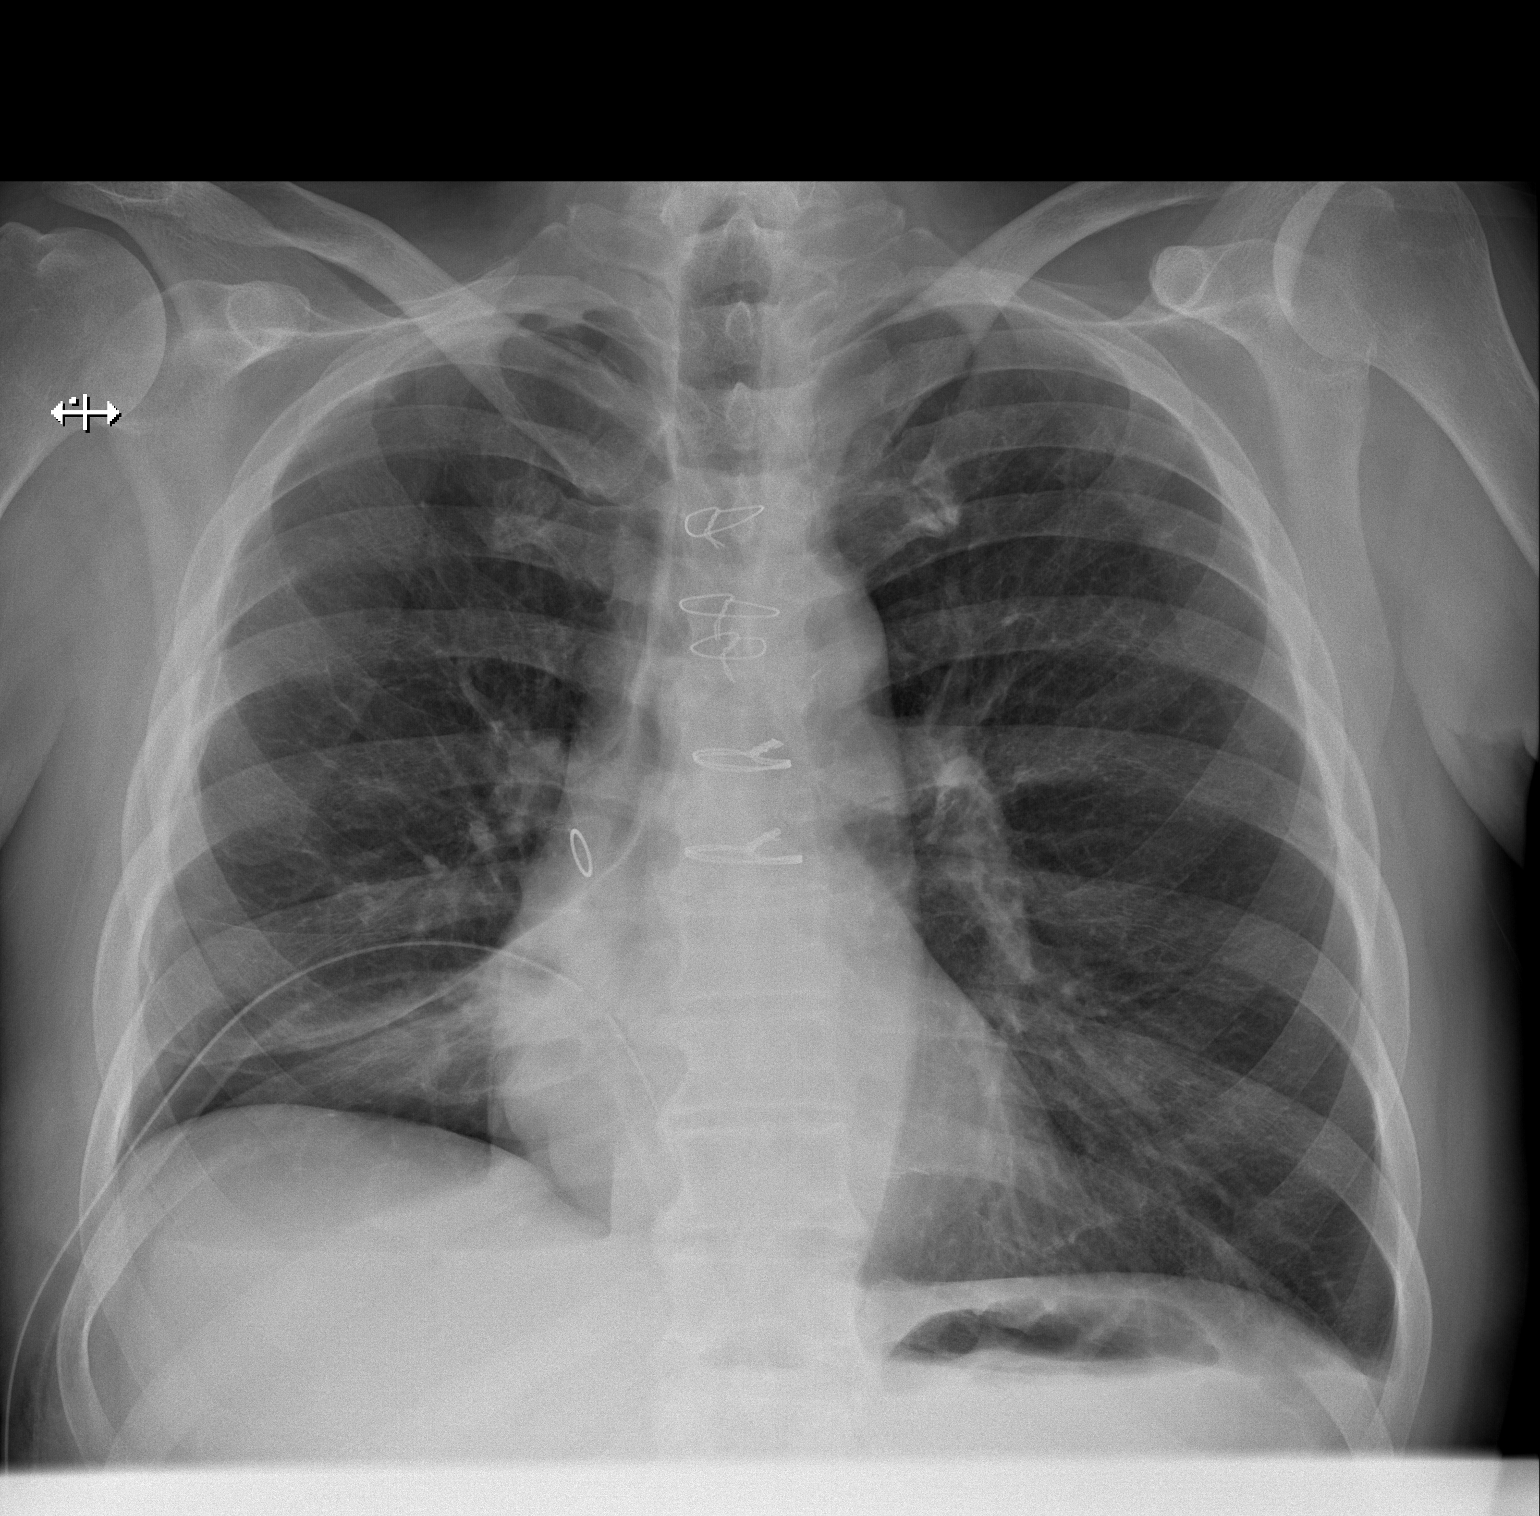
[im 2/2]
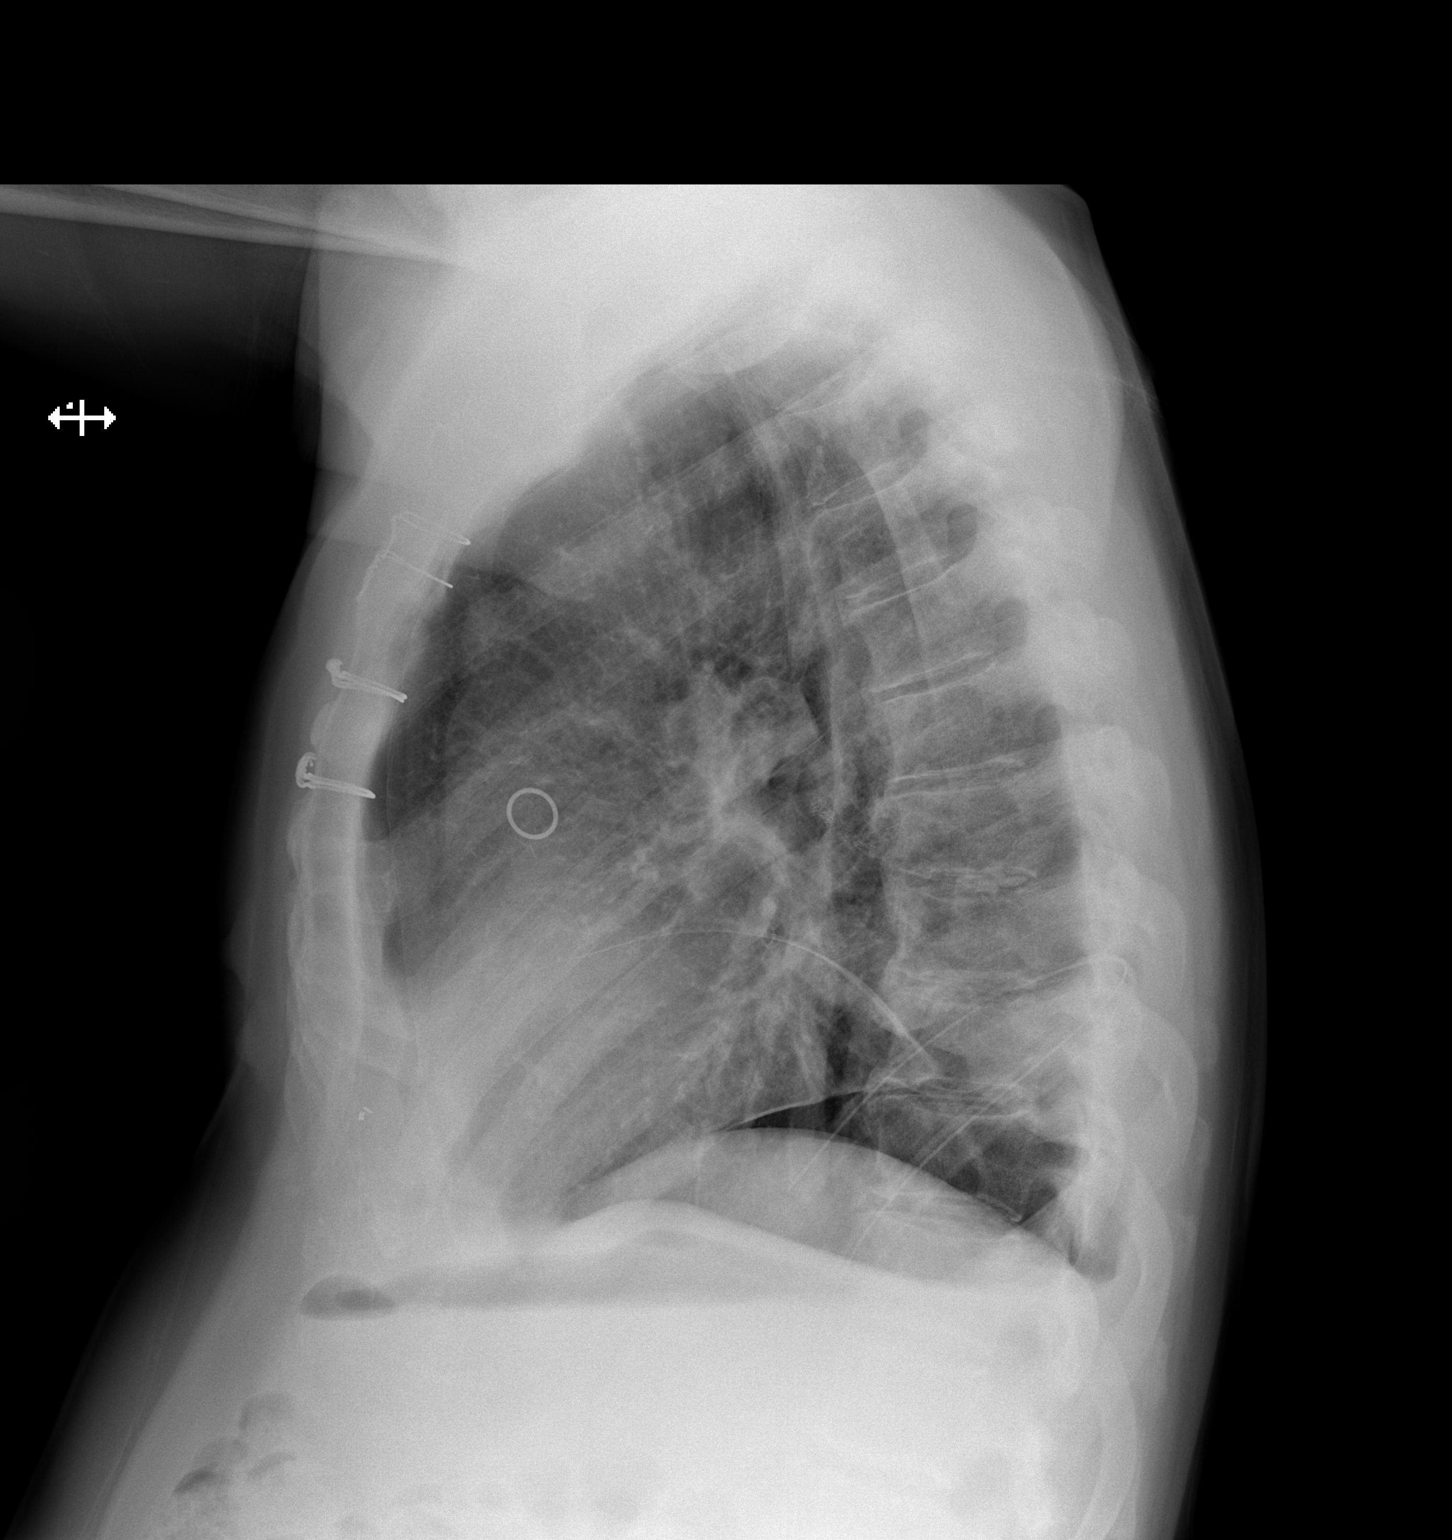

[2 of 2 positions shown; findings below may reference images not displayed]

PROCEDURE:     DXR - DXR CHEST PA (OR AP) AND LATERAL  - [DATE] [DATE]

RESULT:     Comparison is made to the prior exam of [DATE]. Two right
chest tubes remain present. There is noted a small pneumothorax at the right
upper lobe and anteriorly in the right midlung field. Anteriorly, there is
approximately 8 mm separation of the visceral and parietal pleura. At the
right upper lobe there is approximately 1 cm separation at the widest point.
The left lung field is clear. Heart size is normal. There is again noted
increased density posteriorly at the left base.
IMPRESSION: 1. Two right chest tubes remain present.
2. There is a small right pneumothorax.
3. The previously noted density posteriorly at the right chest remains
unchanged in appearance.
4. The left lung field is clear.

## 2011-10-30 ENCOUNTER — Inpatient Hospital Stay: Payer: Self-pay | Admitting: Cardiothoracic Surgery

## 2011-10-30 LAB — COMPREHENSIVE METABOLIC PANEL
Alkaline Phosphatase: 98 U/L (ref 50–136)
Anion Gap: 10 (ref 7–16)
BUN: 16 mg/dL (ref 7–18)
Bilirubin,Total: 0.4 mg/dL (ref 0.2–1.0)
Co2: 31 mmol/L (ref 21–32)
EGFR (African American): >60
Glucose: 126 mg/dL — ABNORMAL HIGH (ref 65–99)
Potassium: 3.6 mmol/L (ref 3.5–5.1)
SGOT(AST): 25 U/L (ref 15–37)
SGPT (ALT): 31 U/L
Total Protein: 7.3 g/dL (ref 6.4–8.2)

## 2011-10-30 LAB — CBC WITH DIFFERENTIAL/PLATELET
Basophil #: 0 10*3/uL (ref 0.0–0.1)
Eosinophil #: 0.4 10*3/uL (ref 0.0–0.7)
Eosinophil %: 3 %
HCT: 38.3 % — ABNORMAL LOW (ref 40.0–52.0)
HGB: 12.2 g/dL — ABNORMAL LOW (ref 13.0–18.0)
Lymphocyte #: 1.7 10*3/uL (ref 1.0–3.6)
MCHC: 31.8 g/dL — ABNORMAL LOW (ref 32.0–36.0)
MCV: 81 fL (ref 80–100)
Monocyte %: 9.9 %
Neutrophil #: 8.7 10*3/uL — ABNORMAL HIGH (ref 1.4–6.5)
RBC: 4.75 10*6/uL (ref 4.40–5.90)
RDW: 15.8 % — ABNORMAL HIGH (ref 11.5–14.5)
WBC: 12 10*3/uL — ABNORMAL HIGH (ref 3.8–10.6)

## 2011-10-30 LAB — AMYLASE: Amylase: 67 U/L (ref 25–115)

## 2011-10-30 IMAGING — CR DG CHEST 2V
1 series · 3 of 3 positions shown · non-contrast
Comparison: none

REASON FOR EXAM: assess for pleural effsuion
COMMENTS:

PROCEDURE:     DXR - DXR CHEST PA (OR AP) AND LATERAL  - [DATE]  [DATE]
RESULT:     Prior CABG. Chest tubes are noted on the right. Tiny
pneumothorax is noted. Atelectasis in the right lung base noted on prior
study of [DATE] has cleared.

[Series 1: ap · 0.17mm/px · 3 of 3 slices shown]
[im 1/3]
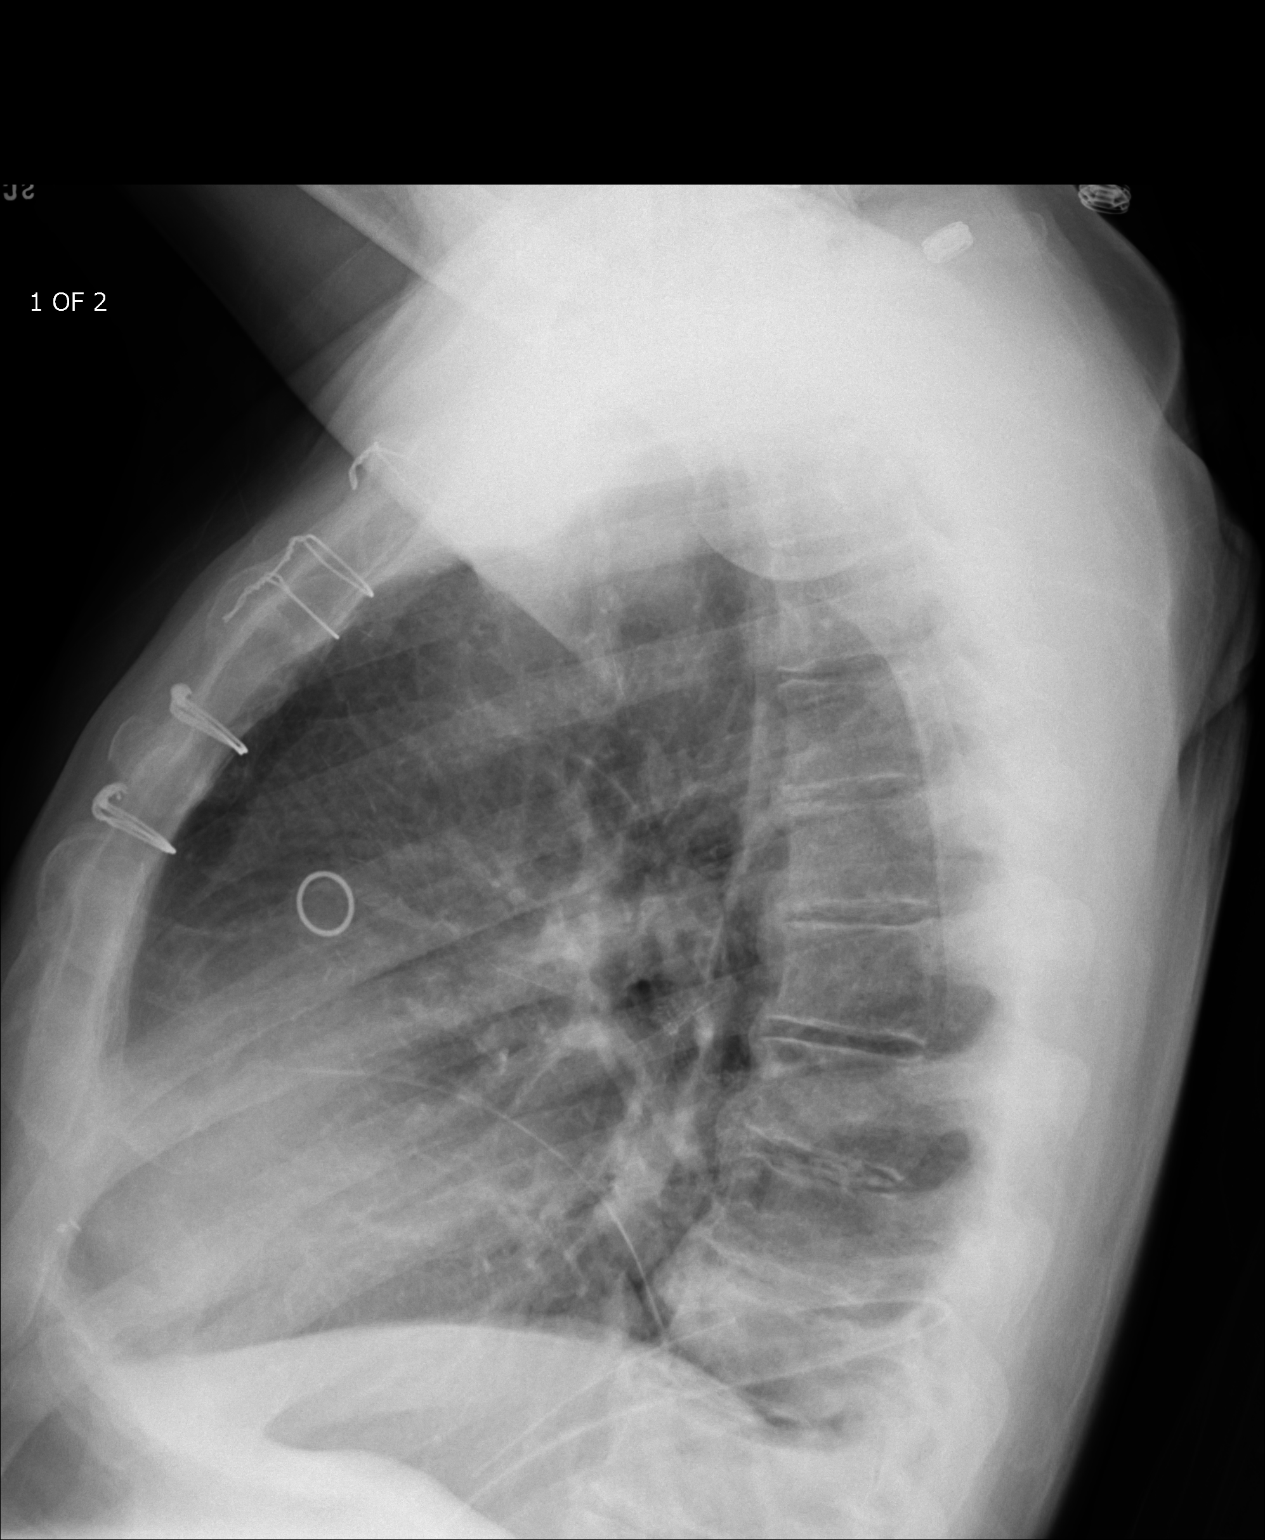
[im 2/3]
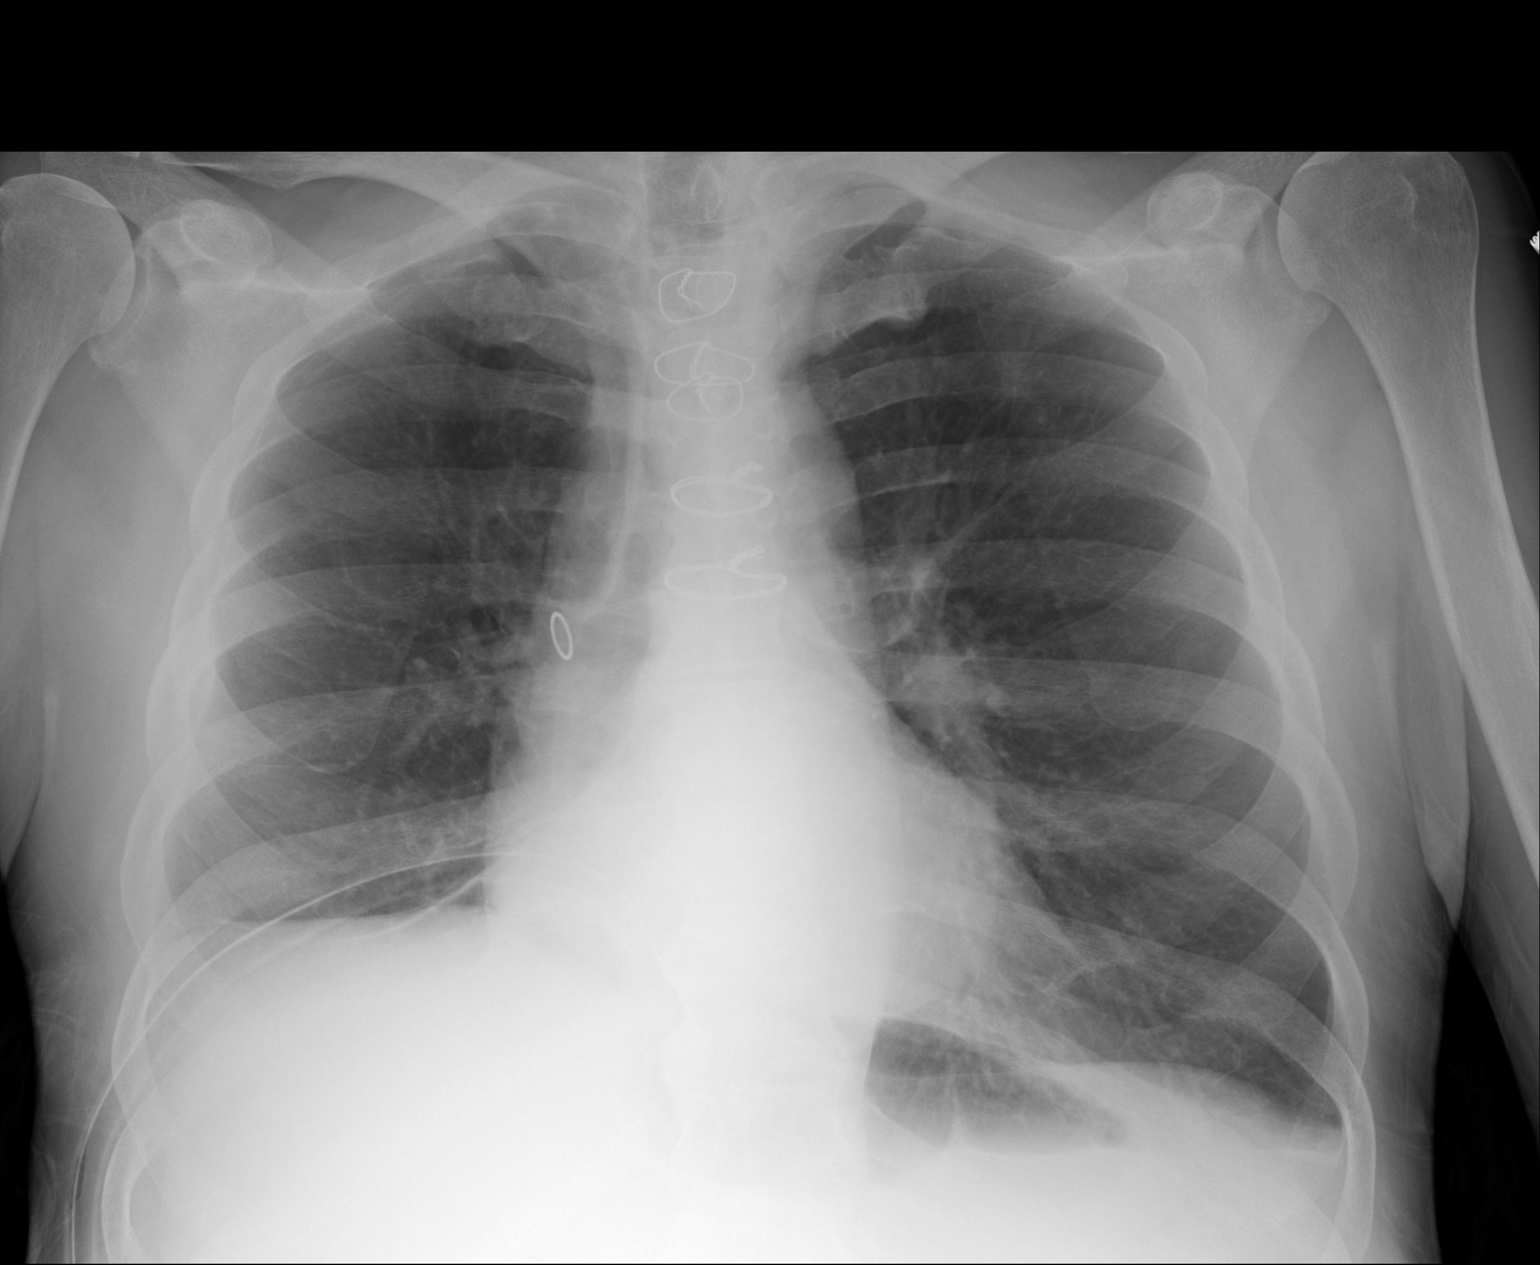
[im 3/3]
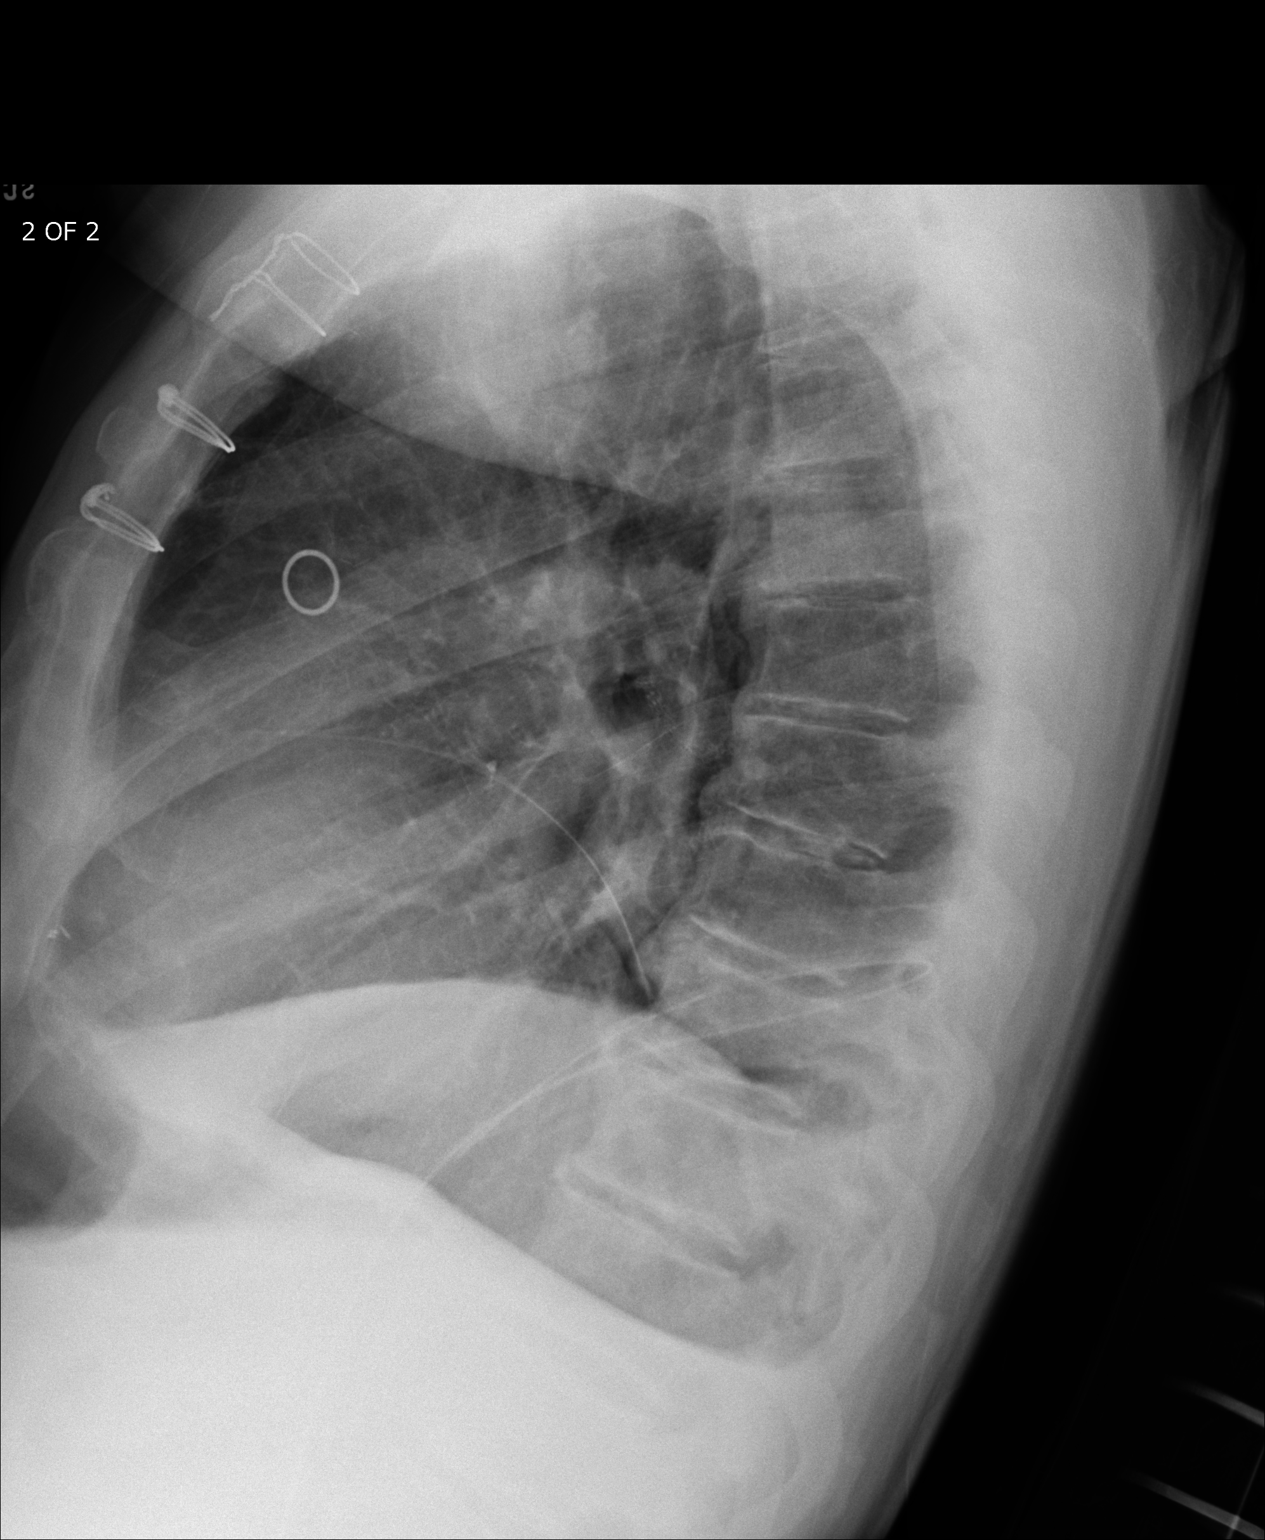

[3 of 3 positions shown; findings below may reference images not displayed]

IMPRESSION: Minimal right apical pneumothorax. Both chest tubes noted
on the right are in stable position. Atelectasis in the right lung base has
improved. No pleural effusion.

[REDACTED]

## 2011-10-30 IMAGING — CR DG CHEST 1V PORT
1 series · 1 of 1 positions shown · non-contrast
Comparison: none

REASON FOR EXAM: assess for pleural effusion
COMMENTS:

[portable]
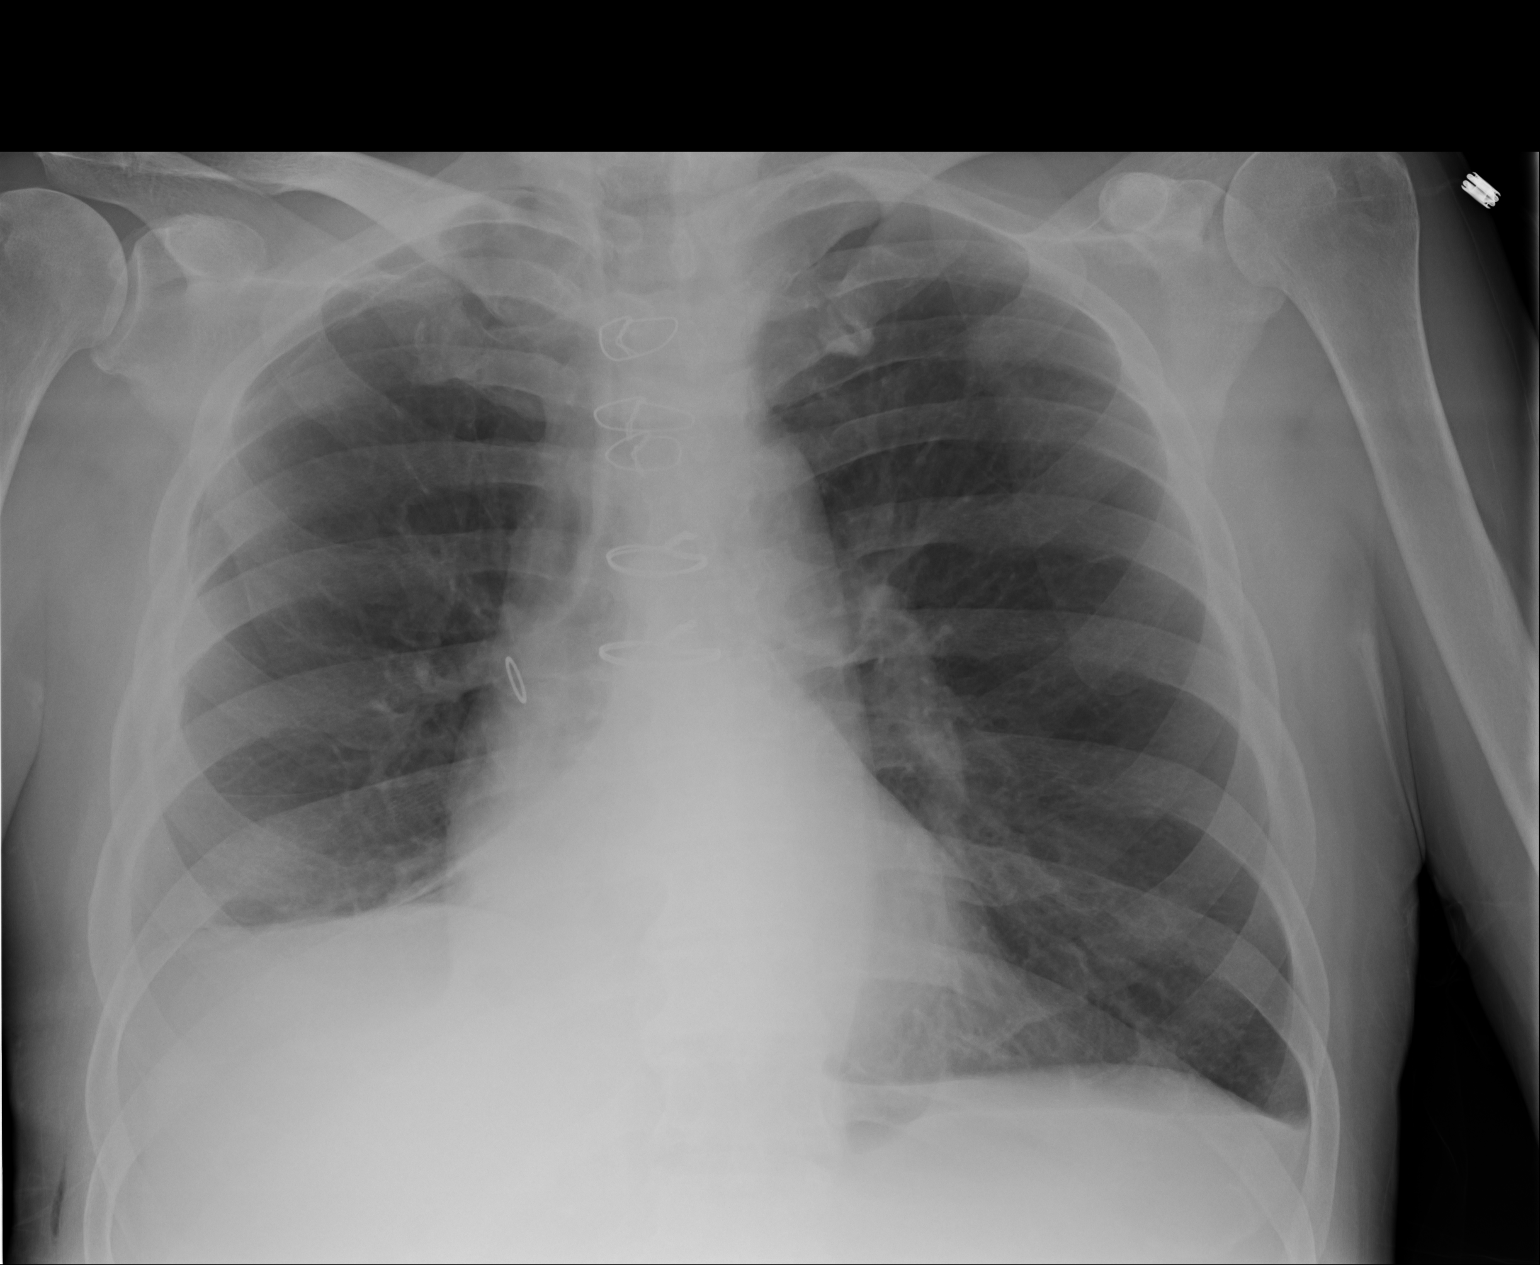

[1 of 1 positions shown; findings below may reference images not displayed]

PROCEDURE:     DXR - DXR PORTABLE CHEST SINGLE VIEW  - [DATE]  [DATE]

RESULT:     Small amount of subcutaneous emphysema is seen along the lower
margin of the film over the right chest wall. There is volume loss in the
right hemithorax consistent with surgery. There appears to be a small
right-sided pneumothorax of the lung apex. Chest tubes have been removed
since the previous exam. There is curvilinear density medially at the right
lung base. Sternotomy wires with CABG markers are present. The left lung is
clear. A trace right pleural effusion cannot be excluded in the blunting of
the right costophrenic angle. Followup PA and lateral views of the chest may
be helpful.
IMPRESSION: Small right-sided pneumothorax.

[REDACTED]

## 2011-11-08 IMAGING — CR DG CHEST 2V
1 series · 2 of 2 positions shown · non-contrast
Comparison: none

REASON FOR EXAM: Cxr Pa & Lat Chest Tube f/u
COMMENTS:

[Series 18: w chest pa · 0.14mm/px · 2 of 2 slices shown]
[im 1/2]
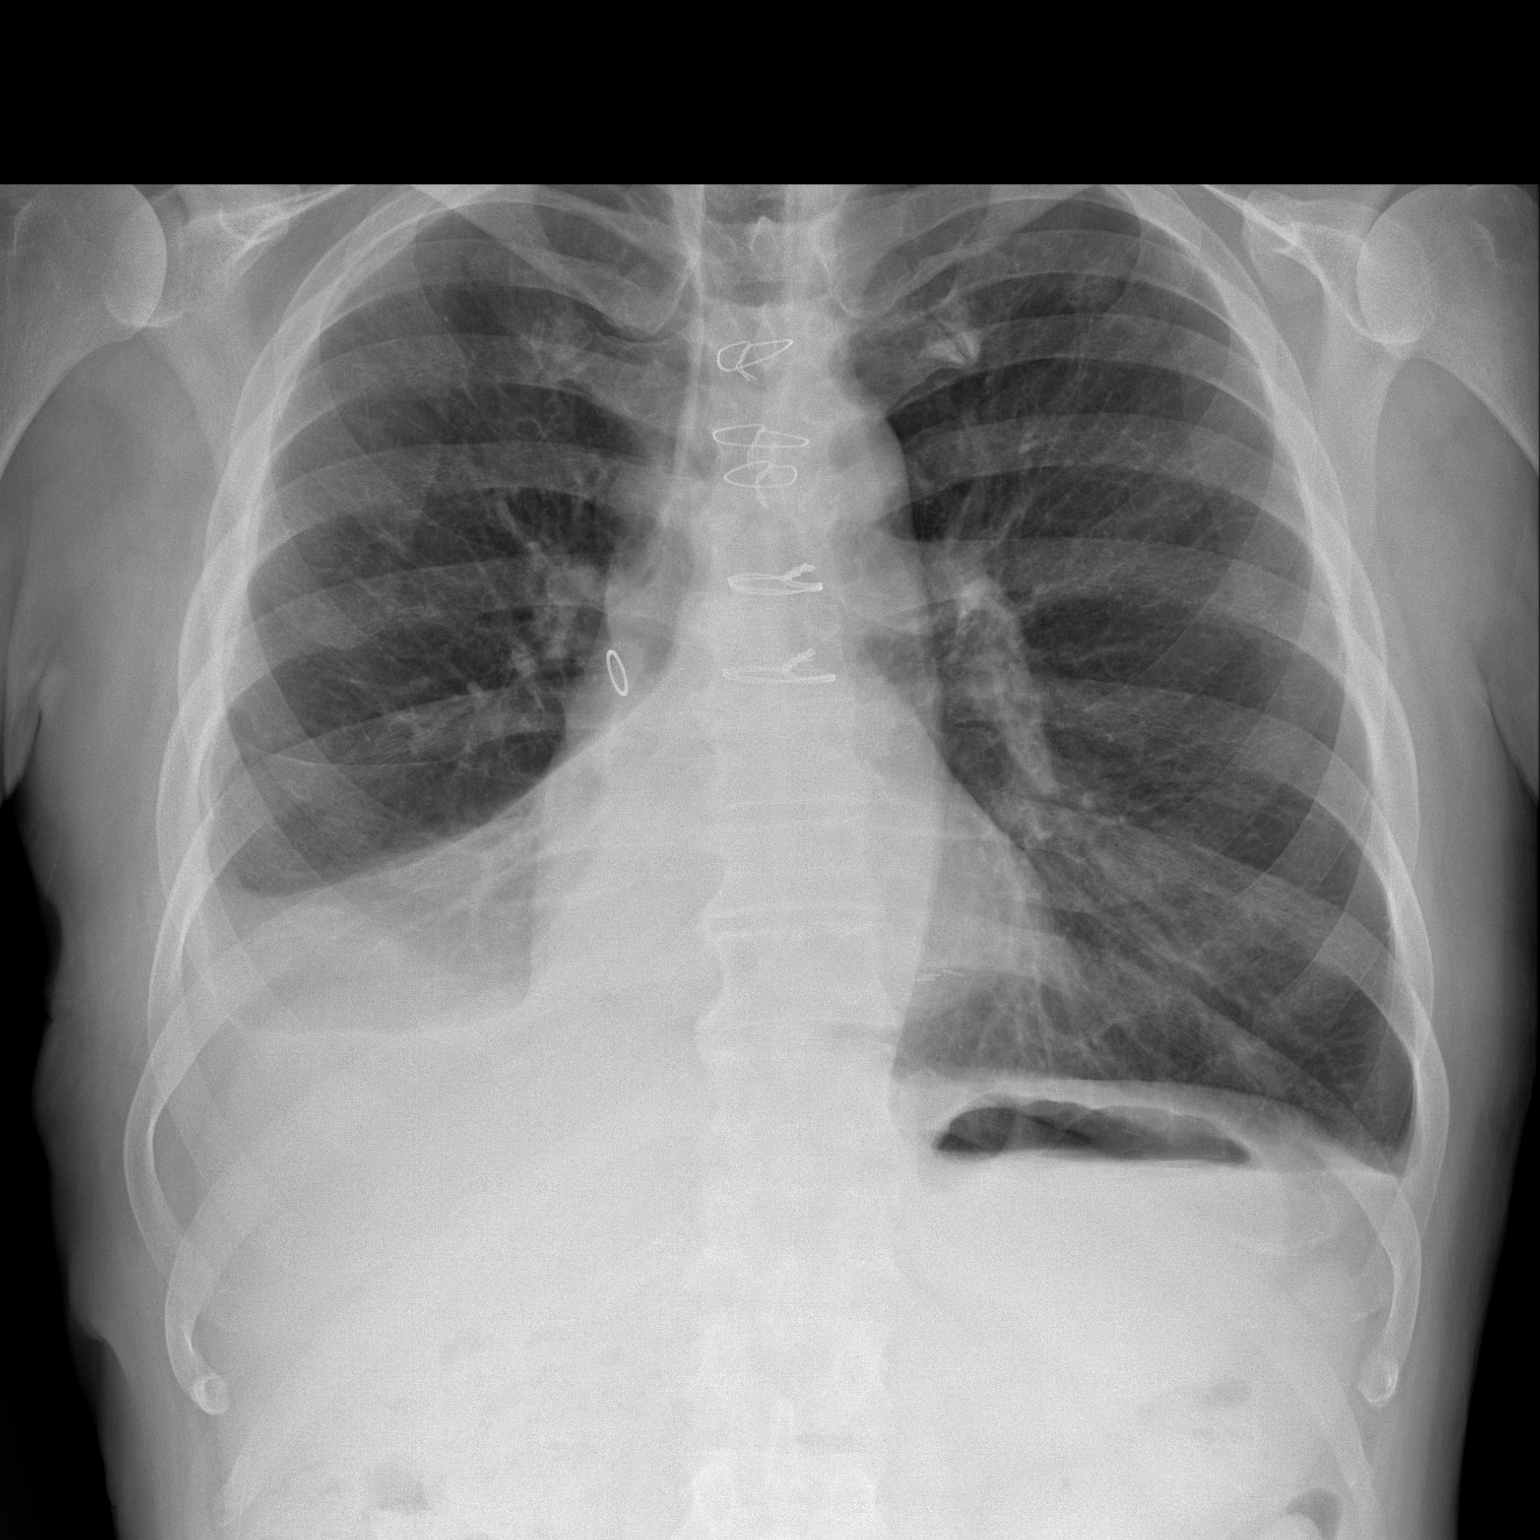
[im 2/2]
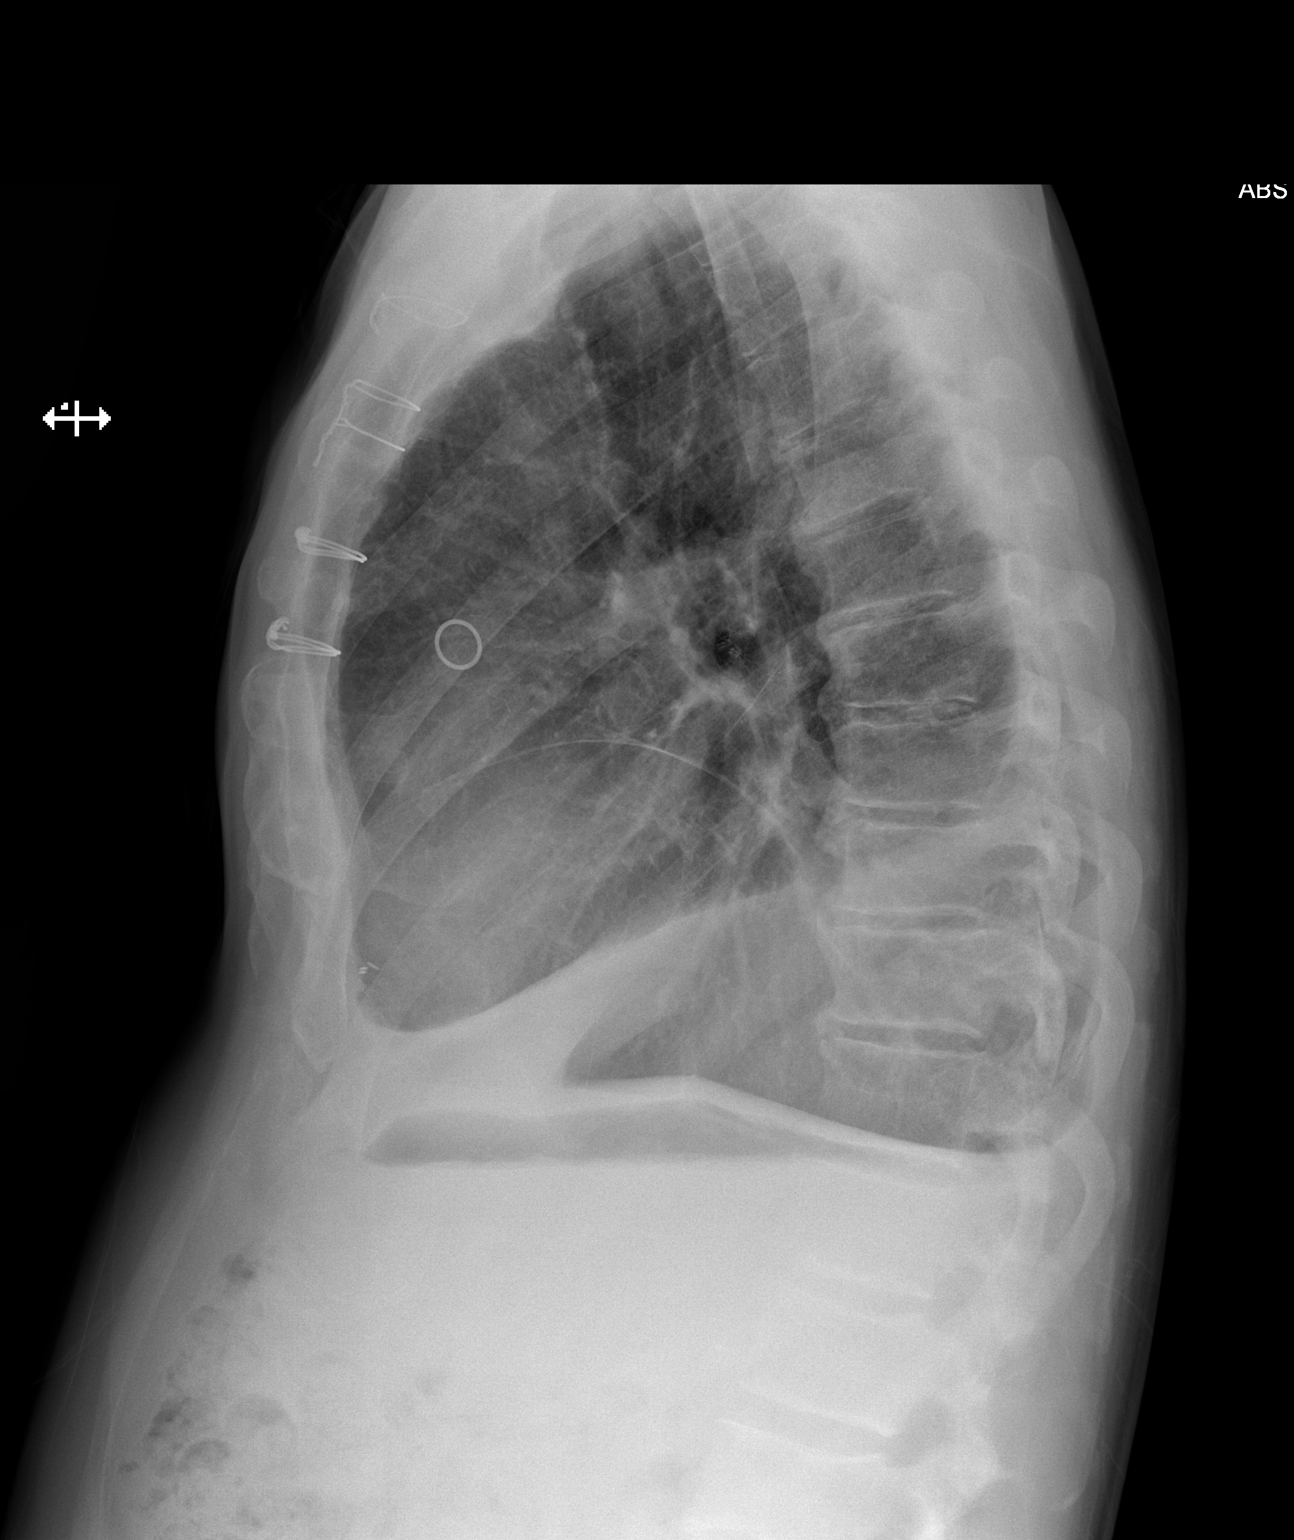

[2 of 2 positions shown; findings below may reference images not displayed]

PROCEDURE:     DXR - DXR CHEST PA (OR AP) AND LATERAL  - [DATE] [DATE]

RESULT:     The patient has a history of right lower lobe resection. There
is increased density at the right base compatible with post operative
change. The small, right pneumothorax, observed on the prior exam of
[DATE], is no longer seen. The left lung field is clear. The heart size is
normal.
IMPRESSION: 1.  No residual or recurrent pneumothorax is identified.
2.  There is increased density at the right base consistent with post
surgical change.

[REDACTED]

## 2011-11-19 ENCOUNTER — Ambulatory Visit: Payer: Self-pay | Admitting: Surgery

## 2011-11-19 IMAGING — CR DG CHEST 1V PORT
1 series · 1 of 1 positions shown · non-contrast
Comparison: none

REASON FOR EXAM: Line placement
COMMENTS:

[portable]
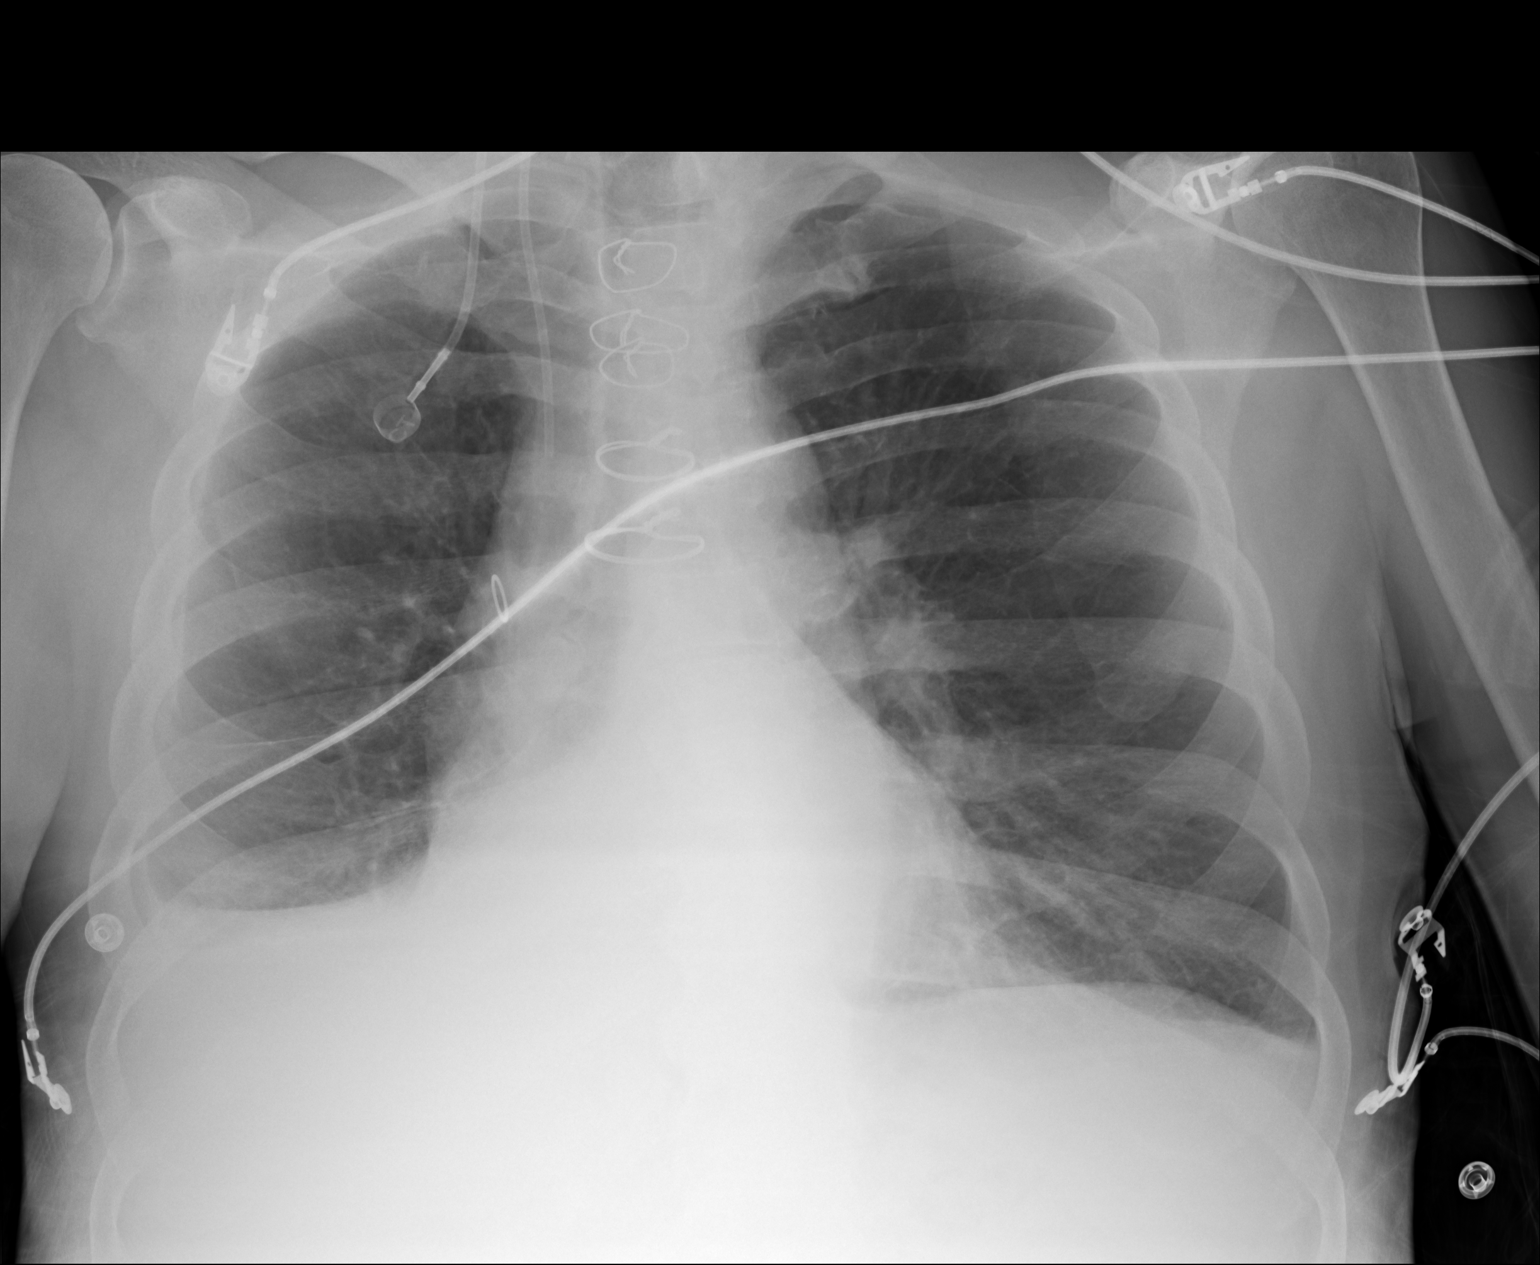

[1 of 1 positions shown; findings below may reference images not displayed]

PROCEDURE:     DXR - DXR PORTABLE CHEST SINGLE VIEW  - [DATE]  [DATE]

RESULT:     Comparison is made to the prior exam of [DATE].

A Port-A-Cath is present with the tip projected over the superior vena cava
in its upper portion. The lung fields are clear of infiltrate. There is
slight fibrotic change at the right costophrenic angle. The hazy increase in
density at the right base, observed on the prior exam and compatible with
postoperative change or atelectasis, has now cleared. The left lung field
remains clear. The heart size is normal. Post operative changes of prior
CABG are noted. No pneumothorax is seen.
IMPRESSION: 1.  A Port-A-Cath is present with the tip projected over the upper portion
of the superior vena cava.
2.  No pneumothorax or pleural effusion is seen.
3.  The lung fields are clear.
4.  The heart size is normal.

[REDACTED]

## 2011-11-20 IMAGING — CT CT HEAD WITHOUT AND WITH CONTRAST
2 of 3 series · 16 of 30 positions shown, 18 images · non-contrast
Comparison: none

REASON FOR EXAM: Initial Stage Lung CA Diabetic Metformin
COMMENTS:

PROCEDURE:     CT  - CT HEAD W/WO  - [DATE]  [DATE]
RESULT:
TECHNIQUE: Helical 5 mm sections were obtained from the skull base through
the vertex pre and post intravenous administration of [GG].

[Series 2: without · axial · non-contrast · 0.43mm/px · z∈[+448,+563]mm · 8 of 31 slices shown]
[im 4/31  brain]
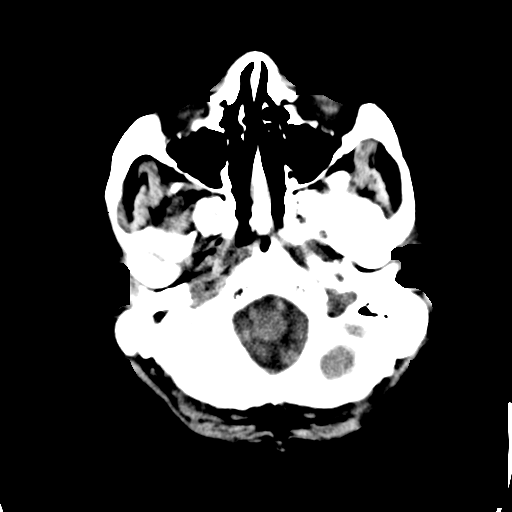
[im 7/31  brain]
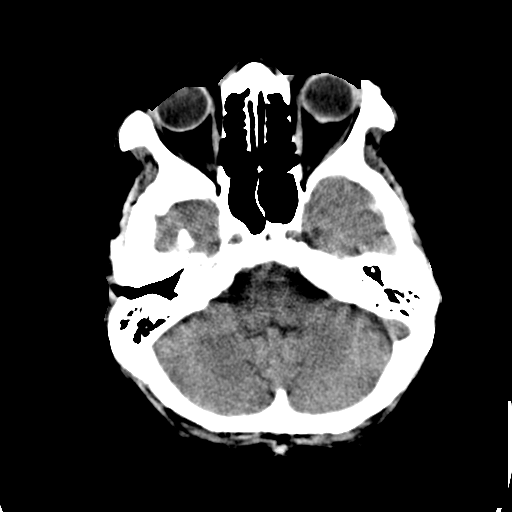
[im 11/31  brain]
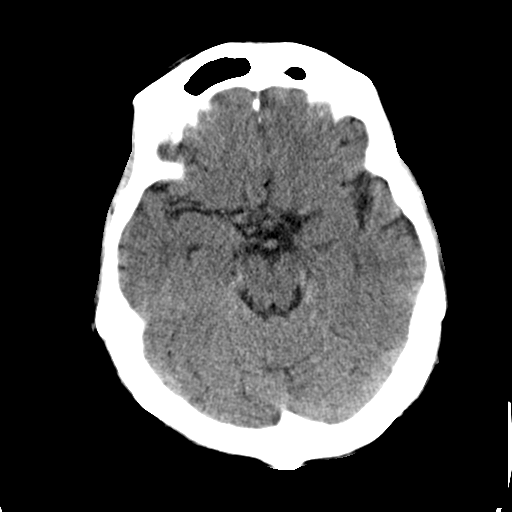
[im 14/31  brain]
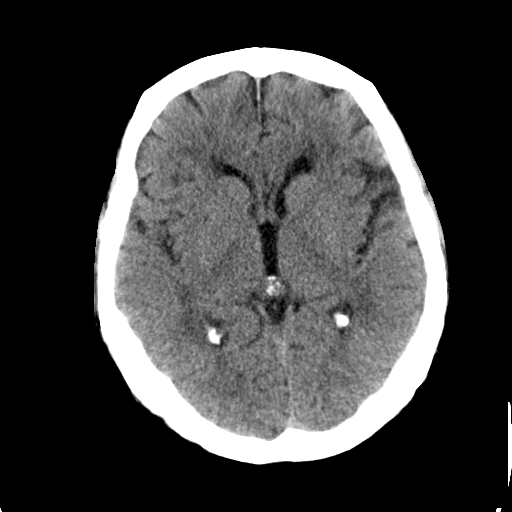
[im 17/31  brain]
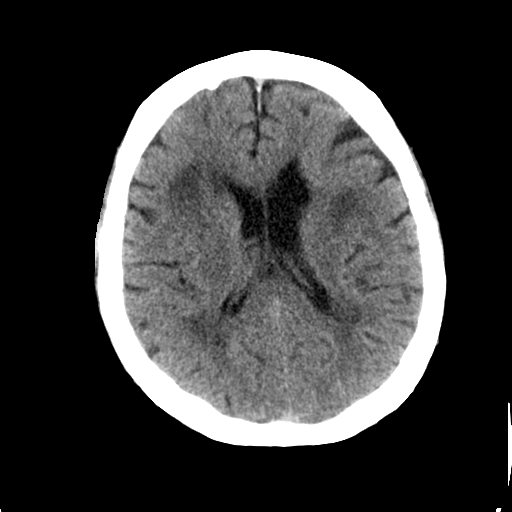
[im 21/31  brain]
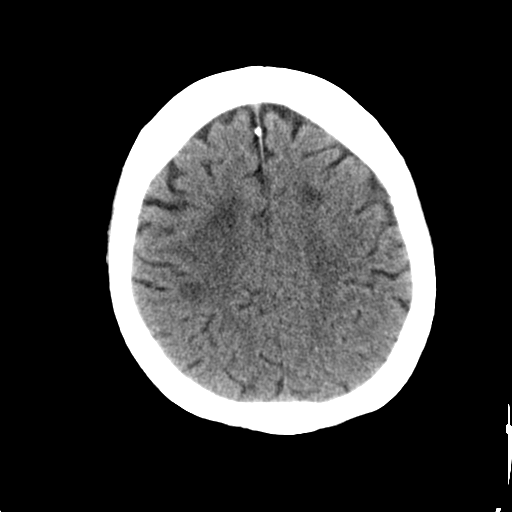
[im 24/31  brain]
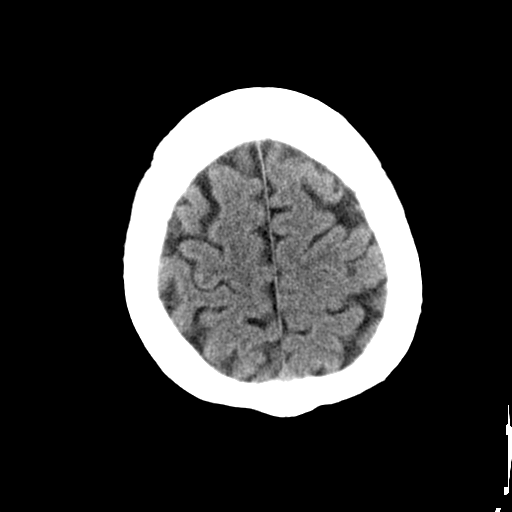
[im 27/31  brain]
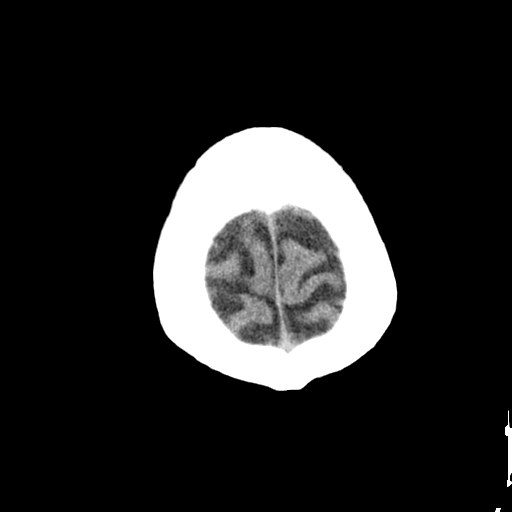

[Series 3: (id) · axial · 0.43mm/px · z∈[+468,+586]mm · 8 of 32 slices shown, 10 images]
[im 4/32  brain]
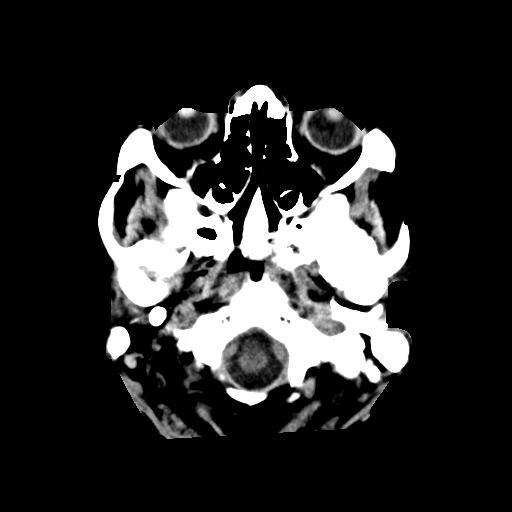
[im 4/32  bone]
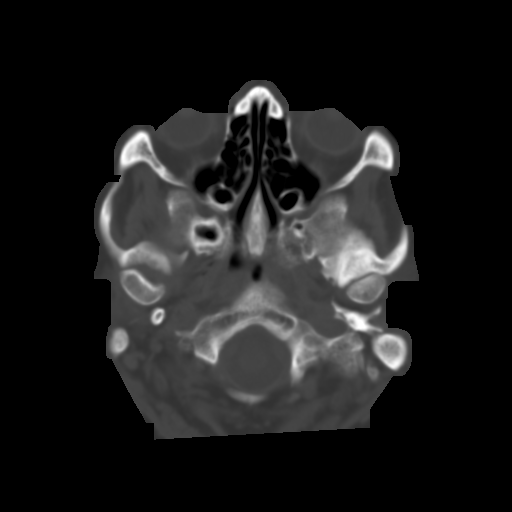
[im 7/32  brain]
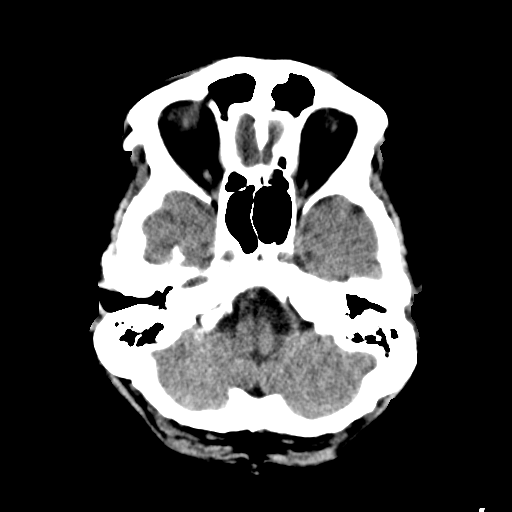
[im 11/32  brain]
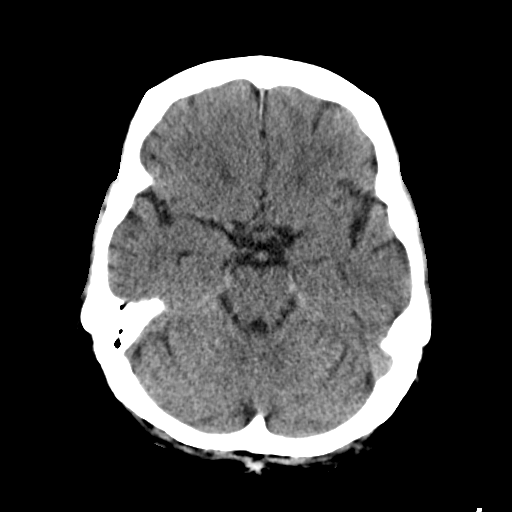
[im 14/32  brain]
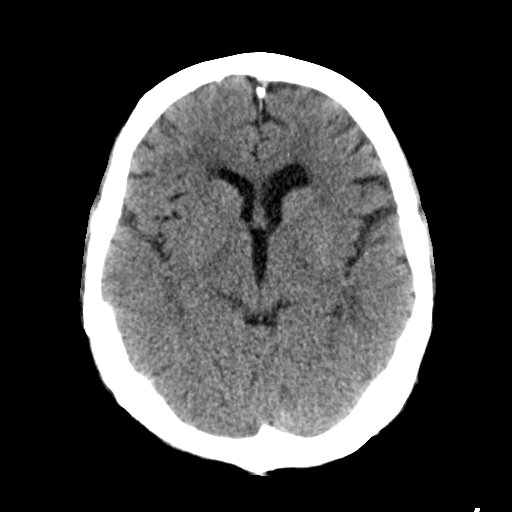
[im 18/32  brain]
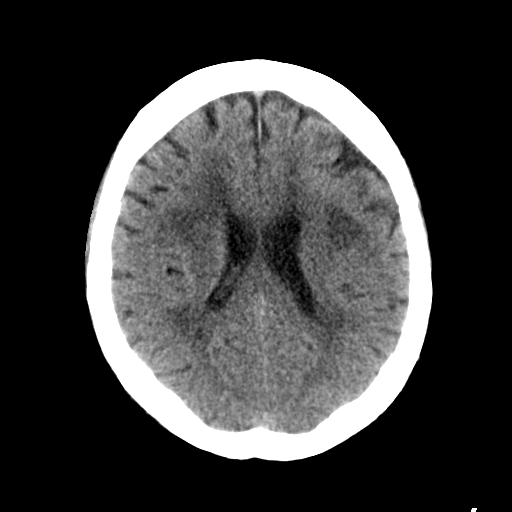
[im 18/32  bone]
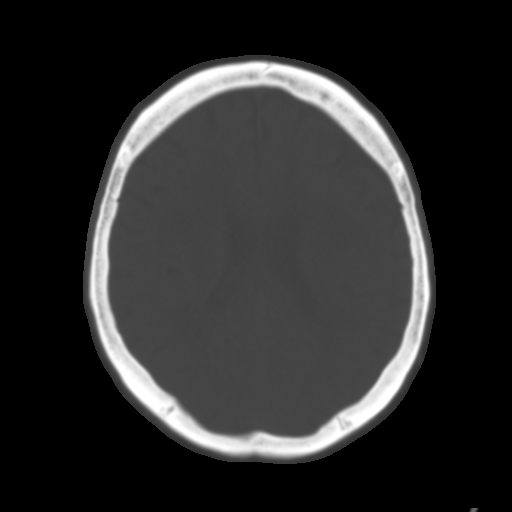
[im 21/32  brain]
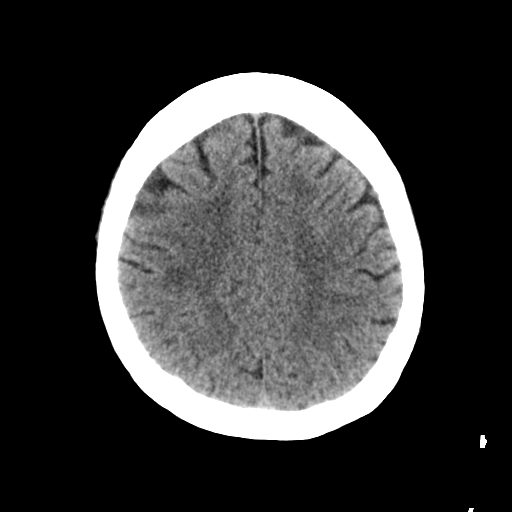
[im 25/32  brain]
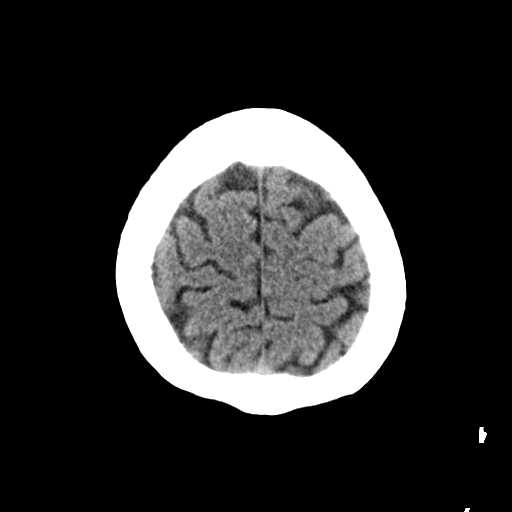
[im 28/32  brain]
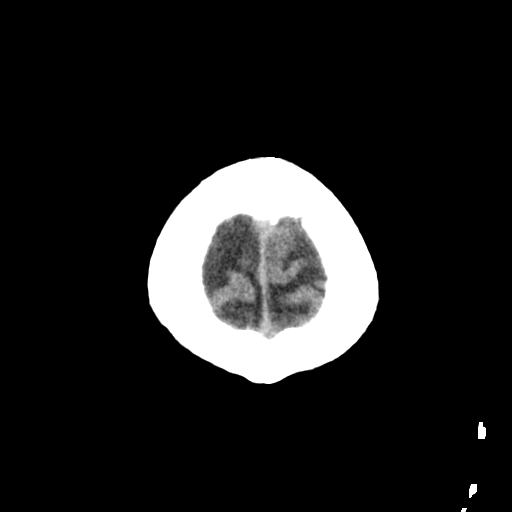

[16 of 30 positions shown; findings below may reference images not displayed]

FINDINGS: Noncontrast imaging demonstrates no evidence of intra-axial or
extra-axial fluid collections. Diffuse areas of low attenuation project
within the subcortical, deep, and periventricular white matter regions. The
ventricles and cisterns are patent. There is no evidence of mass effect.
There is no evidence of acute hemorrhage or intra-axial or extra-axial fluid
collections. Status post administration of intravenous contrast, there is no
evidence of abnormal parenchymal enhancement or enhancing masses or nodules.
The osseous structures demonstrate no evidence of a depressed skull fracture.
IMPRESSION: Findings consistent with small vessel white matter ischemic
changes without evidence of focal or acute abnormalities.

## 2011-11-21 LAB — COMPREHENSIVE METABOLIC PANEL
Albumin: 3 g/dL — ABNORMAL LOW (ref 3.4–5.0)
Calcium, Total: 9 mg/dL (ref 8.5–10.1)
Chloride: 99 mmol/L (ref 98–107)
Co2: 30 mmol/L (ref 21–32)
Creatinine: 1.46 mg/dL — ABNORMAL HIGH (ref 0.60–1.30)
EGFR (African American): 58 — ABNORMAL LOW
Glucose: 227 mg/dL — ABNORMAL HIGH (ref 65–99)
Osmolality: 284 (ref 275–301)
SGOT(AST): 27 U/L (ref 15–37)
SGPT (ALT): 33 U/L
Total Protein: 6.9 g/dL (ref 6.4–8.2)

## 2011-11-21 LAB — CBC CANCER CENTER
Basophil #: 0 x10 3/mm (ref 0.0–0.1)
Basophil %: 0.3 %
Eosinophil %: 1.4 %
HCT: 37.2 % — ABNORMAL LOW (ref 40.0–52.0)
MCH: 26.1 pg (ref 26.0–34.0)
MCV: 80 fL (ref 80–100)
Monocyte #: 0.9 x10 3/mm (ref 0.2–1.0)
Monocyte %: 8.6 %
Neutrophil #: 7.8 x10 3/mm — ABNORMAL HIGH (ref 1.4–6.5)
Neutrophil %: 77.4 %
Platelet: 180 x10 3/mm (ref 150–440)
RBC: 4.63 10*6/uL (ref 4.40–5.90)

## 2011-11-24 ENCOUNTER — Ambulatory Visit: Payer: Self-pay | Admitting: Internal Medicine

## 2011-11-24 ENCOUNTER — Ambulatory Visit: Payer: Self-pay | Admitting: Oncology

## 2011-11-28 LAB — CBC CANCER CENTER
Basophil #: 0 x10 3/mm (ref 0.0–0.1)
Basophil %: 0.3 %
HCT: 36.9 % — ABNORMAL LOW (ref 40.0–52.0)
HGB: 12.1 g/dL — ABNORMAL LOW (ref 13.0–18.0)
Lymphocyte #: 1.3 x10 3/mm (ref 1.0–3.6)
Lymphocyte %: 17.7 %
MCH: 26.2 pg (ref 26.0–34.0)
MCHC: 32.8 g/dL (ref 32.0–36.0)
Monocyte #: 0.2 x10 3/mm (ref 0.2–1.0)
Neutrophil #: 6 x10 3/mm (ref 1.4–6.5)
Neutrophil %: 79.5 %

## 2011-11-28 LAB — COMPREHENSIVE METABOLIC PANEL
Alkaline Phosphatase: 144 U/L — ABNORMAL HIGH (ref 50–136)
Anion Gap: 5 — ABNORMAL LOW (ref 7–16)
Chloride: 97 mmol/L — ABNORMAL LOW (ref 98–107)
Co2: 33 mmol/L — ABNORMAL HIGH (ref 21–32)
EGFR (Non-African Amer.): 41 — ABNORMAL LOW
Glucose: 240 mg/dL — ABNORMAL HIGH (ref 65–99)
Osmolality: 286 (ref 275–301)
SGPT (ALT): 70 U/L
Total Protein: 7.1 g/dL (ref 6.4–8.2)

## 2011-12-05 LAB — CBC CANCER CENTER
Basophil #: 0 x10 3/mm (ref 0.0–0.1)
Basophil %: 1.1 %
Eosinophil #: 0 x10 3/mm (ref 0.0–0.7)
Eosinophil %: 0.8 %
Lymphocyte #: 1.2 x10 3/mm (ref 1.0–3.6)
Lymphocyte %: 58.8 %
MCH: 26 pg (ref 26.0–34.0)
MCHC: 33.2 g/dL (ref 32.0–36.0)
Monocyte %: 11.9 %
Neutrophil #: 0.6 x10 3/mm — ABNORMAL LOW (ref 1.4–6.5)
Neutrophil %: 27.4 %
Platelet: 147 x10 3/mm — ABNORMAL LOW (ref 150–440)
RBC: 4.37 10*6/uL — ABNORMAL LOW (ref 4.40–5.90)
RDW: 15.8 % — ABNORMAL HIGH (ref 11.5–14.5)

## 2011-12-12 LAB — COMPREHENSIVE METABOLIC PANEL
Alkaline Phosphatase: 158 U/L — ABNORMAL HIGH (ref 50–136)
Anion Gap: 8 (ref 7–16)
Bilirubin,Total: 0.2 mg/dL (ref 0.2–1.0)
Calcium, Total: 8.6 mg/dL (ref 8.5–10.1)
Chloride: 102 mmol/L (ref 98–107)
Co2: 27 mmol/L (ref 21–32)
Creatinine: 1.36 mg/dL — ABNORMAL HIGH (ref 0.60–1.30)
EGFR (African American): >60
Glucose: 206 mg/dL — ABNORMAL HIGH (ref 65–99)
Osmolality: 280 (ref 275–301)
Potassium: 3.8 mmol/L (ref 3.5–5.1)
SGOT(AST): 15 U/L (ref 15–37)
SGPT (ALT): 19 U/L
Sodium: 137 mmol/L (ref 136–145)
Total Protein: 6.6 g/dL (ref 6.4–8.2)

## 2011-12-12 LAB — CBC CANCER CENTER
Basophil %: 1.1 %
Eosinophil #: 0 x10 3/mm (ref 0.0–0.7)
HCT: 33.8 % — ABNORMAL LOW (ref 40.0–52.0)
HGB: 11.1 g/dL — ABNORMAL LOW (ref 13.0–18.0)
Lymphocyte %: 32.6 %
MCV: 80 fL (ref 80–100)
Monocyte #: 0.9 x10 3/mm (ref 0.2–1.0)
Monocyte %: 20.3 %
Neutrophil #: 1.9 x10 3/mm (ref 1.4–6.5)
Neutrophil %: 45.4 %
RBC: 4.21 10*6/uL — ABNORMAL LOW (ref 4.40–5.90)
RDW: 17.3 % — ABNORMAL HIGH (ref 11.5–14.5)
WBC: 4.2 x10 3/mm (ref 3.8–10.6)

## 2011-12-19 LAB — COMPREHENSIVE METABOLIC PANEL
Albumin: 3 g/dL — ABNORMAL LOW (ref 3.4–5.0)
Alkaline Phosphatase: 145 U/L — ABNORMAL HIGH (ref 50–136)
Anion Gap: 8 (ref 7–16)
BUN: 20 mg/dL — ABNORMAL HIGH (ref 7–18)
Co2: 28 mmol/L (ref 21–32)
Creatinine: 1.38 mg/dL — ABNORMAL HIGH (ref 0.60–1.30)
Glucose: 186 mg/dL — ABNORMAL HIGH (ref 65–99)
Potassium: 4.2 mmol/L (ref 3.5–5.1)
SGOT(AST): 26 U/L (ref 15–37)
SGPT (ALT): 43 U/L
Sodium: 137 mmol/L (ref 136–145)
Total Protein: 7.1 g/dL (ref 6.4–8.2)

## 2011-12-19 LAB — CBC CANCER CENTER
Basophil #: 0 x10 3/mm (ref 0.0–0.1)
Basophil %: 0.5 %
Eosinophil #: 0 x10 3/mm (ref 0.0–0.7)
Eosinophil %: 0.1 %
HCT: 34.9 % — ABNORMAL LOW (ref 40.0–52.0)
Lymphocyte %: 26.9 %
MCH: 26.4 pg (ref 26.0–34.0)
MCHC: 33.1 g/dL (ref 32.0–36.0)
MCV: 80 fL (ref 80–100)
Monocyte #: 0.2 x10 3/mm (ref 0.2–1.0)
Monocyte %: 3.4 %
Neutrophil %: 69.1 %
Platelet: 112 x10 3/mm — ABNORMAL LOW (ref 150–440)
RBC: 4.37 10*6/uL — ABNORMAL LOW (ref 4.40–5.90)
RDW: 16.7 % — ABNORMAL HIGH (ref 11.5–14.5)
WBC: 5.8 x10 3/mm (ref 3.8–10.6)

## 2011-12-20 IMAGING — CR DG CHEST 2V
1 series · 3 of 3 positions shown · non-contrast
Comparison: none

REASON FOR EXAM: LUNG CA
COMMENTS:

PROCEDURE:     DXR - DXR CHEST PA (OR AP) AND LATERAL  - [DATE]  [DATE]
RESULT:     Comparison: [DATE] and [DATE]

[Series 1: pa · 0.17mm/px · 3 of 3 slices shown]
[im 1/3]
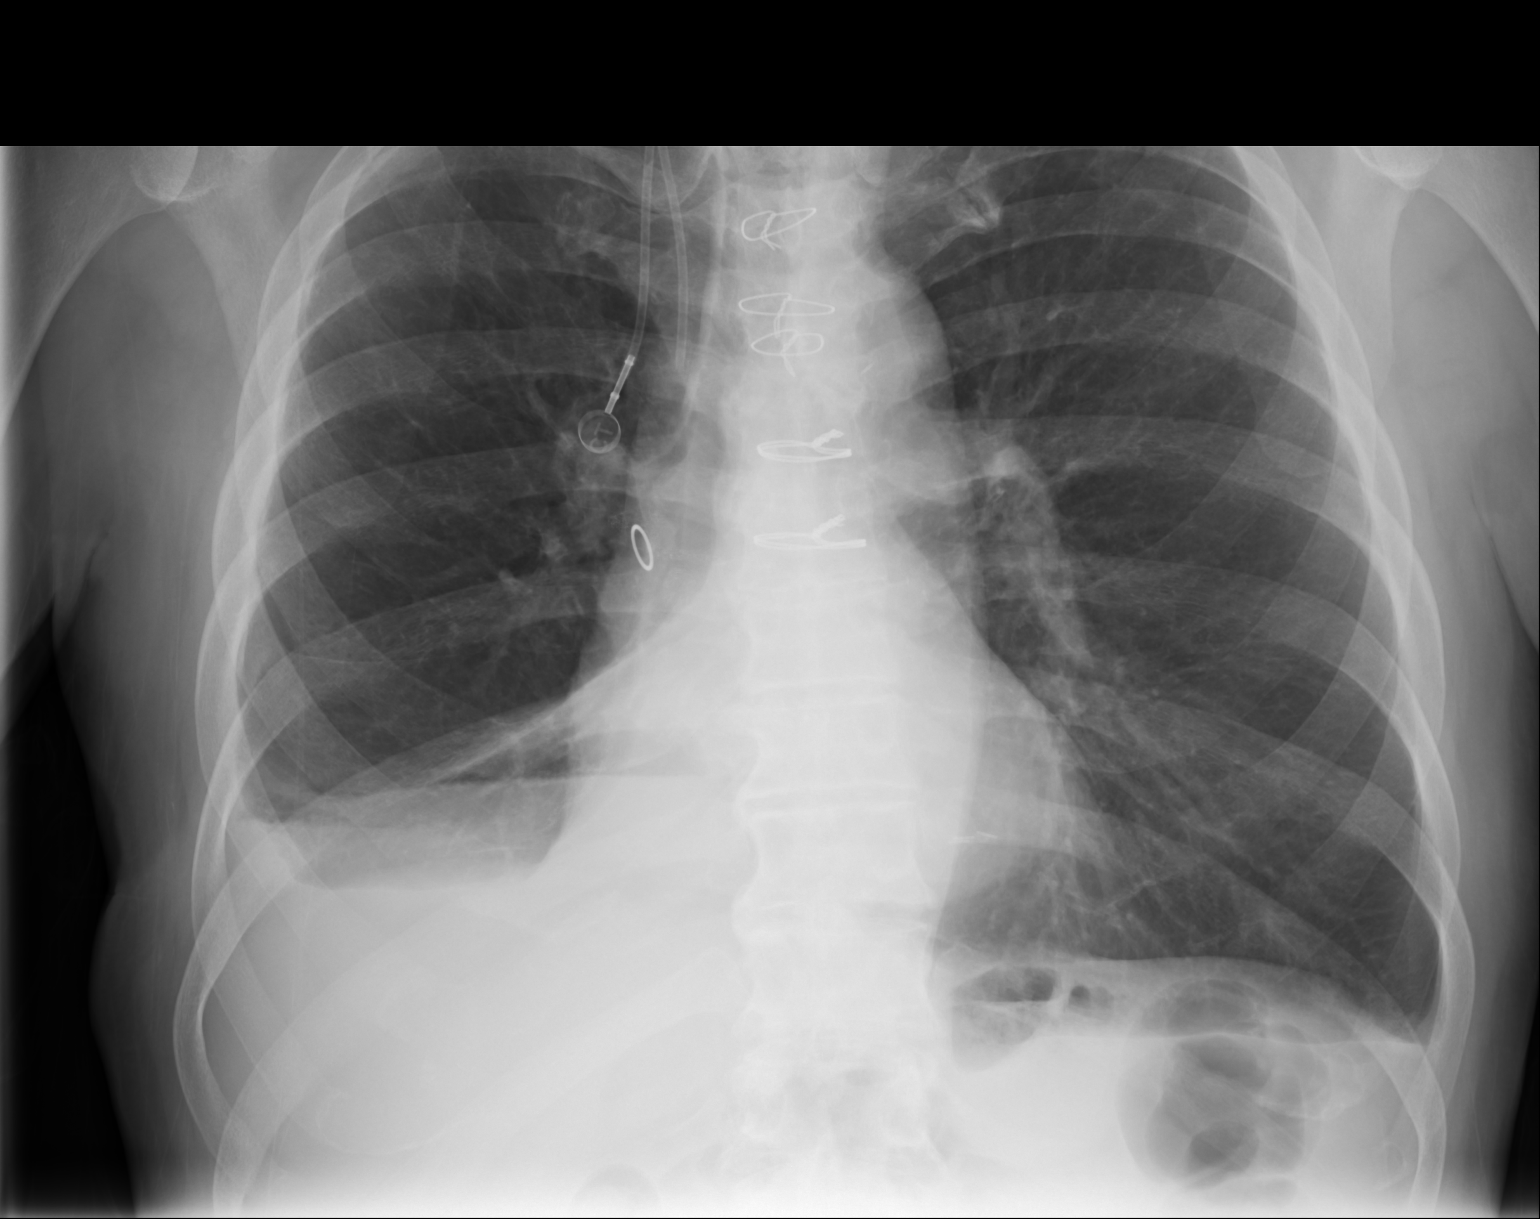
[im 2/3]
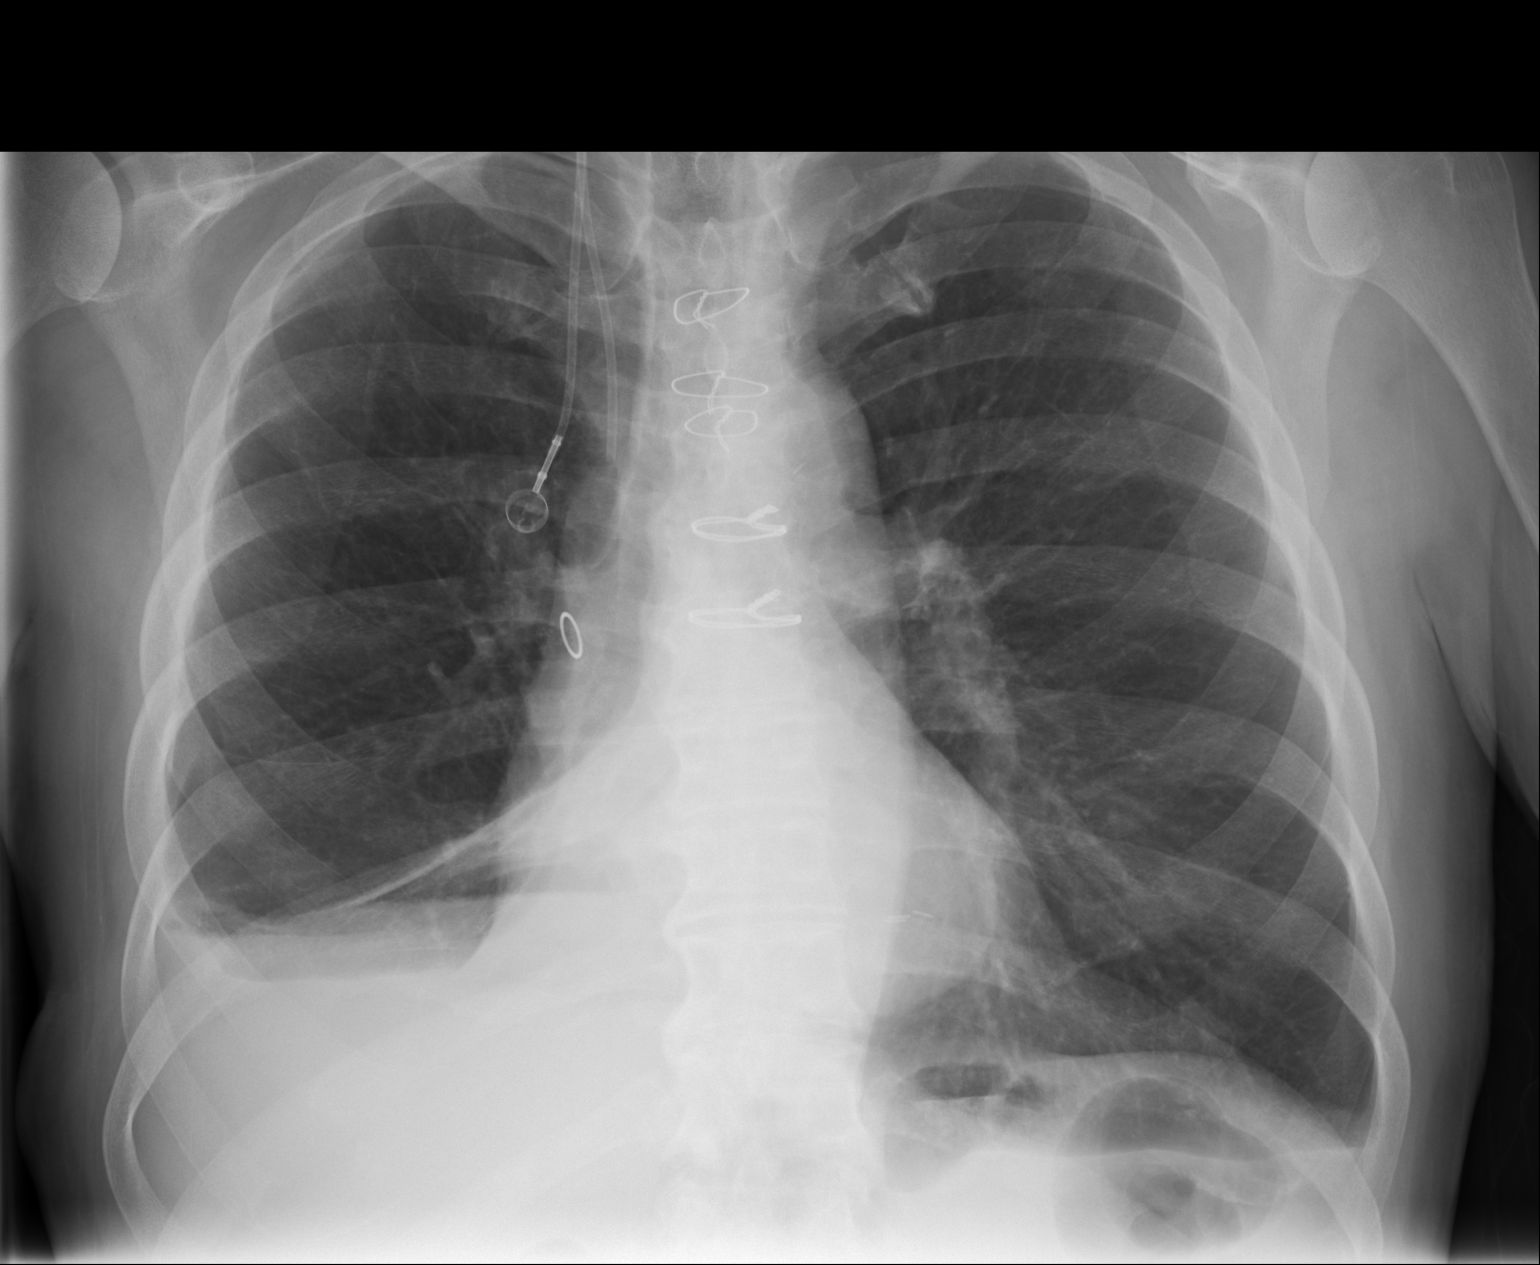
[im 3/3]
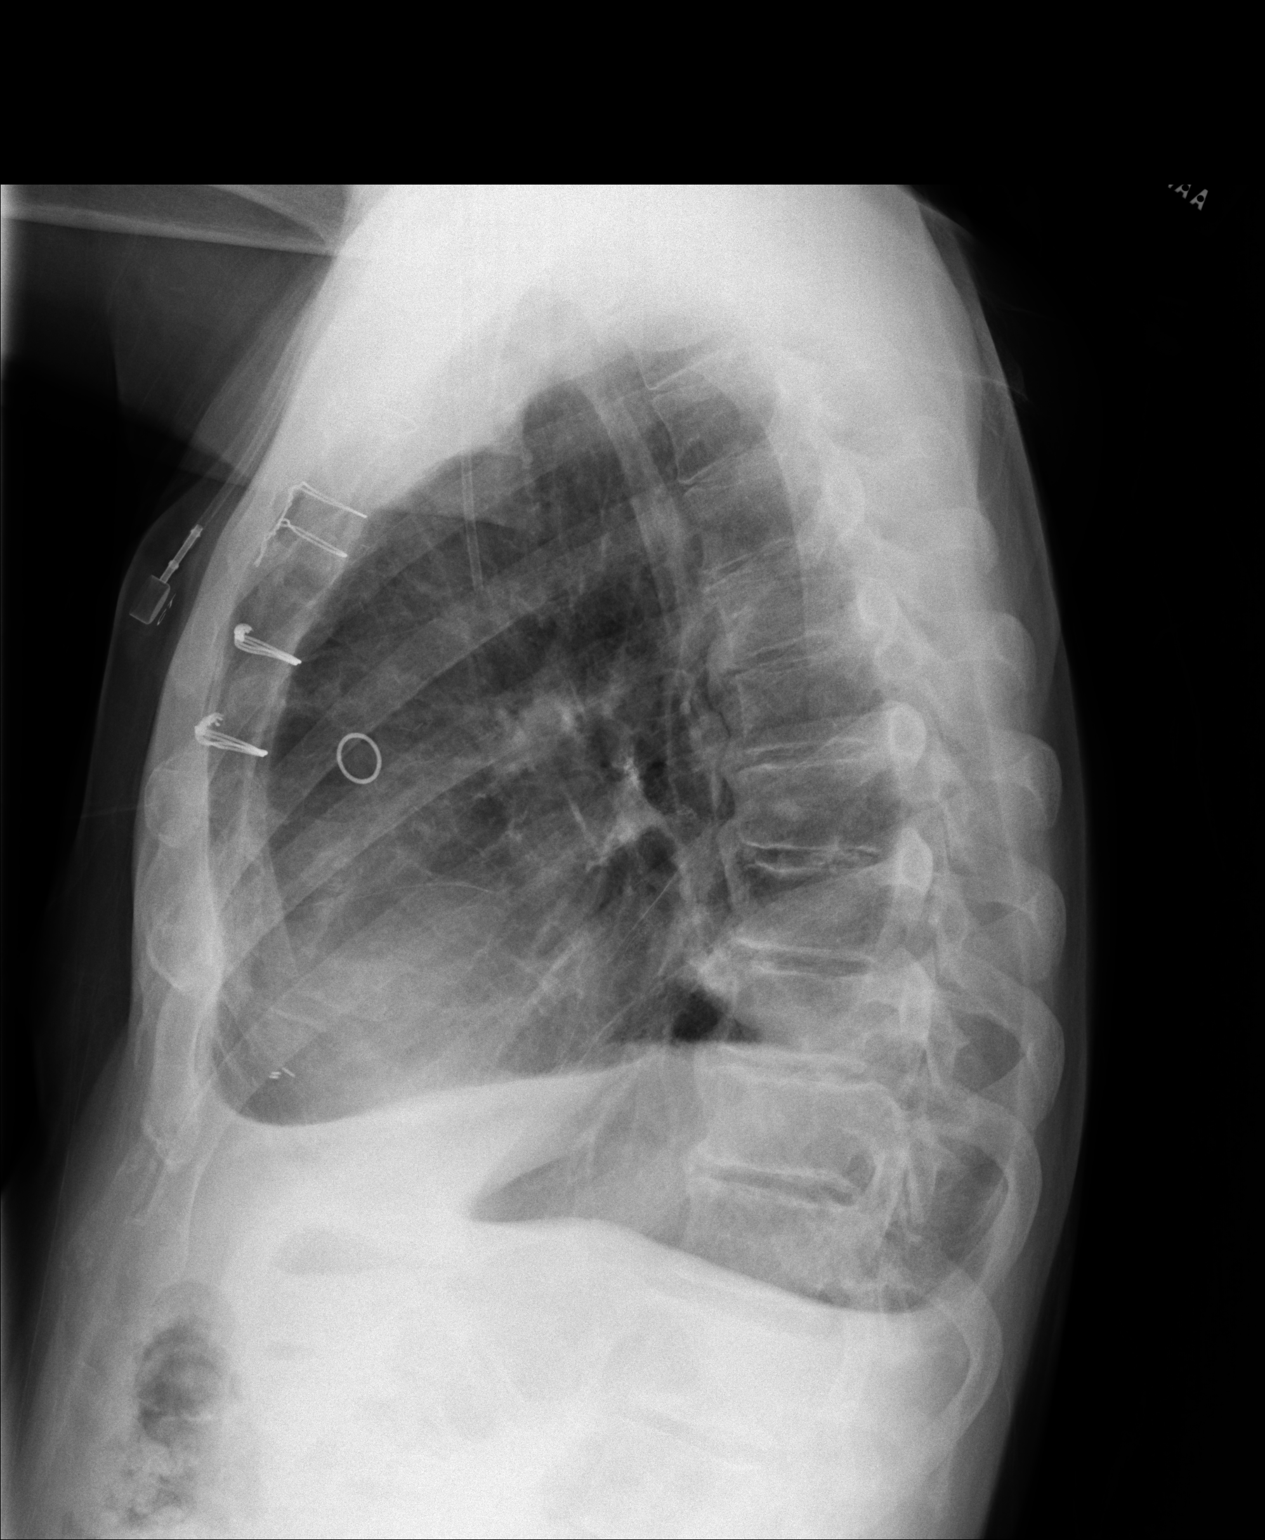

[3 of 3 positions shown; findings below may reference images not displayed]

FINDINGS: Right IJ portacatheter tip terminates over the SVC. The heart and
mediastinum are stable. Elevation of the right hemidiaphragm and opacities
at the right lung base are likely secondary to changes from prior right
lower lobe resection, similar to prior. The lungs are hyperinflated.  Mild
blunting of the left costophrenic angle is similar to prior.
IMPRESSION: 1. Findings likely related to prior right lower lobe resection, similar to
prior.
2. Small left pleural effusion, similar to prior.

[REDACTED]

## 2011-12-24 ENCOUNTER — Ambulatory Visit: Payer: Self-pay | Admitting: Oncology

## 2011-12-24 ENCOUNTER — Ambulatory Visit: Payer: Self-pay | Admitting: Internal Medicine

## 2012-01-02 LAB — CBC CANCER CENTER
Basophil #: 0 x10 3/mm (ref 0.0–0.1)
Basophil %: 0.8 %
Eosinophil %: 0.5 %
HCT: 33.5 % — ABNORMAL LOW (ref 40.0–52.0)
Lymphocyte #: 1.3 x10 3/mm (ref 1.0–3.6)
Lymphocyte %: 29.3 %
MCV: 81 fL (ref 80–100)
Monocyte %: 25.5 %
Neutrophil #: 1.9 x10 3/mm (ref 1.4–6.5)
Neutrophil %: 43.9 %
RDW: 19.2 % — ABNORMAL HIGH (ref 11.5–14.5)
WBC: 4.4 x10 3/mm (ref 3.8–10.6)

## 2012-01-02 LAB — COMPREHENSIVE METABOLIC PANEL
Albumin: 3 g/dL — ABNORMAL LOW (ref 3.4–5.0)
Anion Gap: 10 (ref 7–16)
BUN: 13 mg/dL (ref 7–18)
Bilirubin,Total: 0.3 mg/dL (ref 0.2–1.0)
Co2: 26 mmol/L (ref 21–32)
Creatinine: 1.31 mg/dL — ABNORMAL HIGH (ref 0.60–1.30)
EGFR (African American): >60
Glucose: 184 mg/dL — ABNORMAL HIGH (ref 65–99)
Osmolality: 277 (ref 275–301)
Potassium: 3.8 mmol/L (ref 3.5–5.1)
SGPT (ALT): 19 U/L
Total Protein: 6.9 g/dL (ref 6.4–8.2)

## 2012-01-09 LAB — COMPREHENSIVE METABOLIC PANEL
Albumin: 3.1 g/dL — ABNORMAL LOW (ref 3.4–5.0)
Alkaline Phosphatase: 129 U/L (ref 50–136)
BUN: 18 mg/dL (ref 7–18)
Bilirubin,Total: 0.3 mg/dL (ref 0.2–1.0)
Calcium, Total: 9 mg/dL (ref 8.5–10.1)
Chloride: 98 mmol/L (ref 98–107)
Co2: 28 mmol/L (ref 21–32)
EGFR (African American): >60
Osmolality: 276 (ref 275–301)
SGPT (ALT): 30 U/L
Sodium: 135 mmol/L — ABNORMAL LOW (ref 136–145)
Total Protein: 7 g/dL (ref 6.4–8.2)

## 2012-01-09 LAB — CBC CANCER CENTER
Basophil %: 0.4 %
Eosinophil #: 0 x10 3/mm (ref 0.0–0.7)
Eosinophil %: 0.1 %
HCT: 32.5 % — ABNORMAL LOW (ref 40.0–52.0)
HGB: 10.8 g/dL — ABNORMAL LOW (ref 13.0–18.0)
Lymphocyte #: 1 x10 3/mm (ref 1.0–3.6)
MCHC: 33.2 g/dL (ref 32.0–36.0)
MCV: 81 fL (ref 80–100)
Monocyte #: 0.1 x10 3/mm — ABNORMAL LOW (ref 0.2–1.0)
Neutrophil #: 4.4 x10 3/mm (ref 1.4–6.5)
Platelet: 85 x10 3/mm — ABNORMAL LOW (ref 150–440)
RBC: 4.03 10*6/uL — ABNORMAL LOW (ref 4.40–5.90)
RDW: 18.8 % — ABNORMAL HIGH (ref 11.5–14.5)
WBC: 5.6 x10 3/mm (ref 3.8–10.6)

## 2012-01-15 ENCOUNTER — Other Ambulatory Visit: Payer: Self-pay | Admitting: Internal Medicine

## 2012-01-15 ENCOUNTER — Encounter: Payer: Self-pay | Admitting: Internal Medicine

## 2012-01-15 ENCOUNTER — Ambulatory Visit (INDEPENDENT_AMBULATORY_CARE_PROVIDER_SITE_OTHER): Payer: BC Managed Care – PPO | Admitting: Internal Medicine

## 2012-01-15 VITALS — BP 118/72 | HR 75 | Temp 98.1°F | Resp 16 | Wt 152.2 lb

## 2012-01-15 DIAGNOSIS — I10 Essential (primary) hypertension: Secondary | ICD-10-CM

## 2012-01-15 DIAGNOSIS — E785 Hyperlipidemia, unspecified: Secondary | ICD-10-CM

## 2012-01-15 DIAGNOSIS — N058 Unspecified nephritic syndrome with other morphologic changes: Secondary | ICD-10-CM

## 2012-01-15 DIAGNOSIS — E1129 Type 2 diabetes mellitus with other diabetic kidney complication: Secondary | ICD-10-CM

## 2012-01-15 MED ORDER — LACTULOSE 10 GM/15ML PO SOLN: 20.0000 g | Freq: Two times a day (BID) | ORAL | Status: AC

## 2012-01-15 MED ORDER — ENALAPRIL MALEATE 10 MG PO TABS: 10.0000 mg | ORAL_TABLET | Freq: Every day | ORAL | Status: DC

## 2012-01-15 NOTE — Progress Notes (Signed)
[name], male   DOB: [birth year], 66 y.o.   MRN: [id number]  Patient Active Problem List  Diagnosis  . Polyp of colon, adenomatous  . Hyperlipidemia  . Hypertension  . CAD (coronary artery disease), autologous vein bypass graft  . Personal history of pneumonia  . Type II or unspecified type diabetes mellitus with renal manifestations, not stated as uncontrolled  . Gout attack  . Hemoptysis  . Right lower lobe lung mass  . Cough with hemoptysis    Subjective:  CC:   Chief Complaint  Patient presents with  . Follow-up    HPI:   Nathaniel Lane a 66 y.o. male who presents  For followup on diabetes, hyperlipidemia. He was diagnosed recently with lung Cancer and had a right thoracotomy in May complicated by pneumothorax ,with recurrent hospitalizations for dehydration due to nausea and vomiting . He has had a portacath palced and is undergoing chemotherapy but has chemo session was suspended recently due to thrombocytopenia.  He has lost 20 lbs since his last visit.  Has had constipation x 8 days,   Still taking metformin, lipitor, and enalapril/hctz.     Past Medical History  Diagnosis Date  . Diabetes mellitus   . Hyperlipidemia   . Hypertension   . Myocardial infarction     Past Surgical History  Procedure Date  . Coronary artery bypass graft 2007    The following portions of the patient's history were reviewed and updated as appropriate: Allergies, current medications, and problem list.    Review of Systems:   .Review of Systems  Constitutional: Positive for weight loss and malaise/fatigue.  HENT: Negative.   Eyes: Negative.   Respiratory: Positive for shortness of breath. Negative for cough.   Cardiovascular: Negative for chest pain.  Gastrointestinal: Positive for nausea.  Skin: Negative.   Neurological: Negative for tremors and focal weakness.  Endo/Heme/Allergies: Bruises/bleeds easily.  Psychiatric/Behavioral: Negative.  Negative for  hallucinations. The patient does not have insomnia.         History   Social History  . Marital Status: Married    Spouse Name: N/A    Number of Children: N/A  . 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 Smoking status: Former Games developer  . Smokeless tobacco: Never Used  . Alcohol Use: No  . Drug Use: No  . Sexually Active: Not on file   Other Topics Concern  . Not on file   Social History Narrative  . No narrative on file    Objective:  BP 118/72  Pulse 75  Temp 98.1 F (36.7 C) (Oral)  Resp 16  Wt 152 lb 4 oz (69.06 kg)  SpO2 97%  General appearance: alert, cooperative and appears stated age Ears: normal TM's and external ear canals both ears Throat: lips, mucosa, and tongue normal; teeth and gums normal Neck: no adenopathy, no carotid bruit, supple, symmetrical, trachea midline and thyroid not enlarged, symmetric, no tenderness/mass/nodules Back: symmetric, no curvature. ROM normal. No CVA tenderness. Lungs: clear to auscultation bilaterally Heart: regular rate and rhythm, S1, S2 normal, no murmur, click, rub or gallop Abdomen: soft, non-tender; bowel sounds normal; no masses,  no organomegaly Pulses: 2+ and symmetric Skin: Skin color, texture, turgor normal. No rashes or lesions Lymph nodes: Cervical, supraclavicular, and axillary nodes normal.  Assessment and Plan:  Type II or unspecified type diabetes mellitus with renal manifestations, not stated as uncontrolled  We are stopping metformin today to minimize his nausea. hgba1c to be checked with next lab draw at [facility]cology.  Hyperlipidemia Managed with lipitor.  LDL ordered,will consider stopping statin as well.   Hypertension Eliminating hctz from regimen to prevent aggravation of electrolyte derangement and dehydration.    Updated Medication List Outpatient Encounter Prescriptions as of 01/15/2012  Medication Sig Dispense Refill  . atorvastatin  (LIPITOR) 40 MG tablet Take 40 mg by mouth daily.      Marland Kitchen HYDROcodone-acetaminophen (NORCO/VICODIN) 5-325 MG per tablet Take 1 tablet by mouth every 6 (six) hours as needed.      Marland Kitchen morphine (MS CONTIN) 15 MG 12 hr tablet Take 15 mg by mouth 2 (two) times daily.      . ondansetron (ZOFRAN) 8 MG tablet Take 8 mg by mouth every 8 (eight) hours as needed.      . prochlorperazine (COMPAZINE) 10 MG tablet Take 10 mg by mouth every 6 (six) hours as needed.      . Tamsulosin HCl (FLOMAX) 0.4 MG CAPS Take 1 capsule (0.4 mg total) by mouth daily.  90 capsule  3  . zolpidem (AMBIEN) 10 MG tablet Take 10 mg by mouth at bedtime as needed.      Marland Kitchen DISCONTD: enalapril-hydrochlorothiazide (VASERETIC) 10-25 MG per tablet Take 1 tablet by mouth at bedtime.  90 tablet  3  . DISCONTD: metFORMIN (GLUCOPHAGE) 500 MG tablet Take 1 tablet (500 mg total) by mouth 2 (two) times daily with a meal.  180 tablet  3  . enalapril (VASOTEC) 10 MG tablet Take 1 tablet (10 mg total) by mouth daily.  90 tablet  1  . lactulose (CHRONULAC) 10 GM/15ML solution Take 30 mLs (20 g total) by mouth 2 (two) times daily. As needed for constipation  240 mL  1  . DISCONTD: allopurinol (ZYLOPRIM) 100 MG tablet Take 2 tablets (200 mg total) by mouth daily.  60 tablet  3  . DISCONTD: atorvastatin (LIPITOR) 20 MG tablet Take 1 tablet (20 mg total) by mouth at bedtime.  90 tablet  3  . DISCONTD: niacin (NIASPAN) 1000 MG CR tablet Take 1 tablet (1,000 mg total) by mouth at bedtime.  90 tablet  3     No orders of the defined types were placed in this encounter.    Return in about 3 months (around 04/16/2012).

## 2012-01-15 NOTE — Assessment & Plan Note (Signed)
Eliminating hctz from regimen to prevent aggravation of electrolyte derangement and dehydration.

## 2012-01-15 NOTE — Assessment & Plan Note (Signed)
Managed with lipitor.  LDL ordered,will consider stopping statin as well.

## 2012-01-15 NOTE — Patient Instructions (Addendum)
Stop your metformin,  It may be aggravating  Your nausea.  Do not resume the niaspan either.   I am changing your blood pressure medication to eleimiate the hctz from it (the new one will just be enalapril)  i will call in lactulose for constipation, to [facility].  It works in about 4 hours.   Of you have recurrent nausea and vomiting ,  Suspend your blood pressure medication to prevent worsening low blood pressure  Ask the cancer Center to draw an LDL and a HgbA1c tomorrow and send to me,

## 2012-01-15 NOTE — Assessment & Plan Note (Signed)
We are stopping metformin today to minimize his nausea. hgba1c to be checked with next lab draw at [facility].

## 2012-01-16 ENCOUNTER — Ambulatory Visit: Payer: Self-pay | Admitting: Internal Medicine

## 2012-01-16 LAB — CBC CANCER CENTER
Basophil %: 0.5 %
Eosinophil %: 0.8 %
Lymphocyte #: 1.1 x10 3/mm (ref 1.0–3.6)
MCH: 26.9 pg (ref 26.0–34.0)
MCHC: 32.8 g/dL (ref 32.0–36.0)
MCV: 82 fL (ref 80–100)
Monocyte #: 0.9 x10 3/mm (ref 0.2–1.0)
Neutrophil #: 0.5 x10 3/mm — ABNORMAL LOW (ref 1.4–6.5)
Neutrophil %: 20.5 %
Platelet: 107 x10 3/mm — ABNORMAL LOW (ref 150–440)
RBC: 4.05 10*6/uL — ABNORMAL LOW (ref 4.40–5.90)
RDW: 19.5 % — ABNORMAL HIGH (ref 11.5–14.5)
WBC: 2.5 x10 3/mm — ABNORMAL LOW (ref 3.8–10.6)

## 2012-01-16 LAB — COMPREHENSIVE METABOLIC PANEL
Albumin: 2.9 g/dL — ABNORMAL LOW (ref 3.4–5.0)
Alkaline Phosphatase: 134 U/L (ref 50–136)
Bilirubin,Total: 0.3 mg/dL (ref 0.2–1.0)
Calcium, Total: 9.1 mg/dL (ref 8.5–10.1)
Co2: 28 mmol/L (ref 21–32)
EGFR (Non-African Amer.): 48 — ABNORMAL LOW
Glucose: 178 mg/dL — ABNORMAL HIGH (ref 65–99)
Osmolality: 274 (ref 275–301)
SGOT(AST): 15 U/L (ref 15–37)
SGPT (ALT): 19 U/L

## 2012-01-17 ENCOUNTER — Telehealth: Payer: Self-pay | Admitting: Internal Medicine

## 2012-01-17 NOTE — Telephone Encounter (Signed)
Opened in error

## 2012-01-23 LAB — COMPREHENSIVE METABOLIC PANEL
Albumin: 3.1 g/dL — ABNORMAL LOW (ref 3.4–5.0)
Alkaline Phosphatase: 141 U/L — ABNORMAL HIGH (ref 50–136)
Anion Gap: 8 (ref 7–16)
BUN: 14 mg/dL (ref 7–18)
Calcium, Total: 9 mg/dL (ref 8.5–10.1)
Creatinine: 1.41 mg/dL — ABNORMAL HIGH (ref 0.60–1.30)
Glucose: 189 mg/dL — ABNORMAL HIGH (ref 65–99)
Osmolality: 276 (ref 275–301)
SGPT (ALT): 18 U/L
Sodium: 135 mmol/L — ABNORMAL LOW (ref 136–145)
Total Protein: 7.2 g/dL (ref 6.4–8.2)

## 2012-01-23 LAB — CBC CANCER CENTER
Eosinophil %: 0.4 %
HCT: 35.7 % — ABNORMAL LOW (ref 40.0–52.0)
Lymphocyte %: 17.9 %
MCHC: 32.7 g/dL (ref 32.0–36.0)
MCV: 83 fL (ref 80–100)
Monocyte #: 1.5 x10 3/mm — ABNORMAL HIGH (ref 0.2–1.0)
Neutrophil %: 67.8 %
Platelet: 219 x10 3/mm (ref 150–440)
RDW: 22.3 % — ABNORMAL HIGH (ref 11.5–14.5)
WBC: 11.2 x10 3/mm — ABNORMAL HIGH (ref 3.8–10.6)

## 2012-01-24 ENCOUNTER — Ambulatory Visit: Payer: Self-pay | Admitting: Oncology

## 2012-01-24 ENCOUNTER — Ambulatory Visit: Payer: Self-pay | Admitting: Internal Medicine

## 2012-01-29 ENCOUNTER — Encounter: Payer: Self-pay | Admitting: Internal Medicine

## 2012-01-29 ENCOUNTER — Emergency Department: Payer: Self-pay | Admitting: Emergency Medicine

## 2012-01-29 LAB — CBC
HCT: 36.1 % — ABNORMAL LOW (ref 40.0–52.0)
HGB: 12 g/dL — ABNORMAL LOW (ref 13.0–18.0)
MCV: 82 fL (ref 80–100)
Platelet: 87 10*3/uL — ABNORMAL LOW (ref 150–440)
RBC: 4.41 10*6/uL (ref 4.40–5.90)
WBC: 5.5 10*3/uL (ref 3.8–10.6)

## 2012-01-29 LAB — URINALYSIS, COMPLETE
Bacteria: NONE SEEN
Bilirubin,UR: NEGATIVE
Blood: NEGATIVE
Glucose,UR: 50 mg/dL (ref 0–75)
Ketone: NEGATIVE
Leukocyte Esterase: NEGATIVE
RBC,UR: 1 /HPF (ref 0–5)
Specific Gravity: 1.019 (ref 1.003–1.030)
Squamous Epithelial: <1

## 2012-01-29 LAB — CK TOTAL AND CKMB (NOT AT ARMC): CK, Total: 35 U/L (ref 35–232)

## 2012-01-29 LAB — COMPREHENSIVE METABOLIC PANEL
Alkaline Phosphatase: 124 U/L (ref 50–136)
BUN: 23 mg/dL — ABNORMAL HIGH (ref 7–18)
Bilirubin,Total: 0.4 mg/dL (ref 0.2–1.0)
Chloride: 103 mmol/L (ref 98–107)
Co2: 27 mmol/L (ref 21–32)
Creatinine: 1.46 mg/dL — ABNORMAL HIGH (ref 0.60–1.30)
EGFR (African American): 58 — ABNORMAL LOW
Glucose: 157 mg/dL — ABNORMAL HIGH (ref 65–99)
Osmolality: 283 (ref 275–301)
SGOT(AST): 28 U/L (ref 15–37)
Sodium: 138 mmol/L (ref 136–145)
Total Protein: 7.9 g/dL (ref 6.4–8.2)

## 2012-01-29 LAB — TROPONIN I: Troponin-I: <0.02 ng/mL

## 2012-01-29 LAB — LIPASE, BLOOD: Lipase: 52 U/L — ABNORMAL LOW (ref 73–393)

## 2012-01-29 IMAGING — CR DG ABDOMEN 3V
1 series · 4 of 4 positions shown · non-contrast
Comparison: none

REASON FOR EXAM: Pain/weakness/ vomiting
COMMENTS:   May transport without cardiac monitor

PROCEDURE:     DXR - DXR ABDOMEN 3-WAY (INCL PA CXR)  - [DATE]  [DATE]
RESULT:

[Series 1: w chest pa · 0.14mm/px · 4 of 4 slices shown]
[im 1/4]
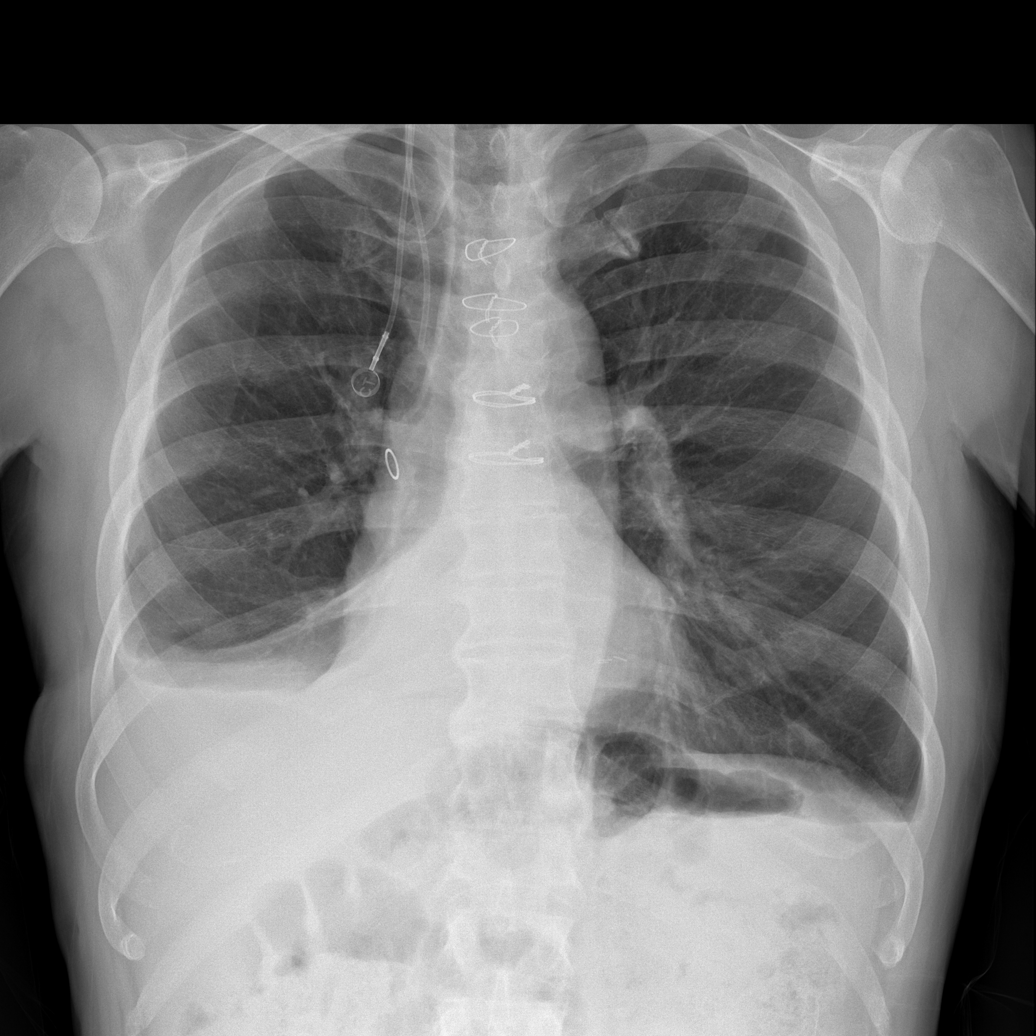
[im 2/4]
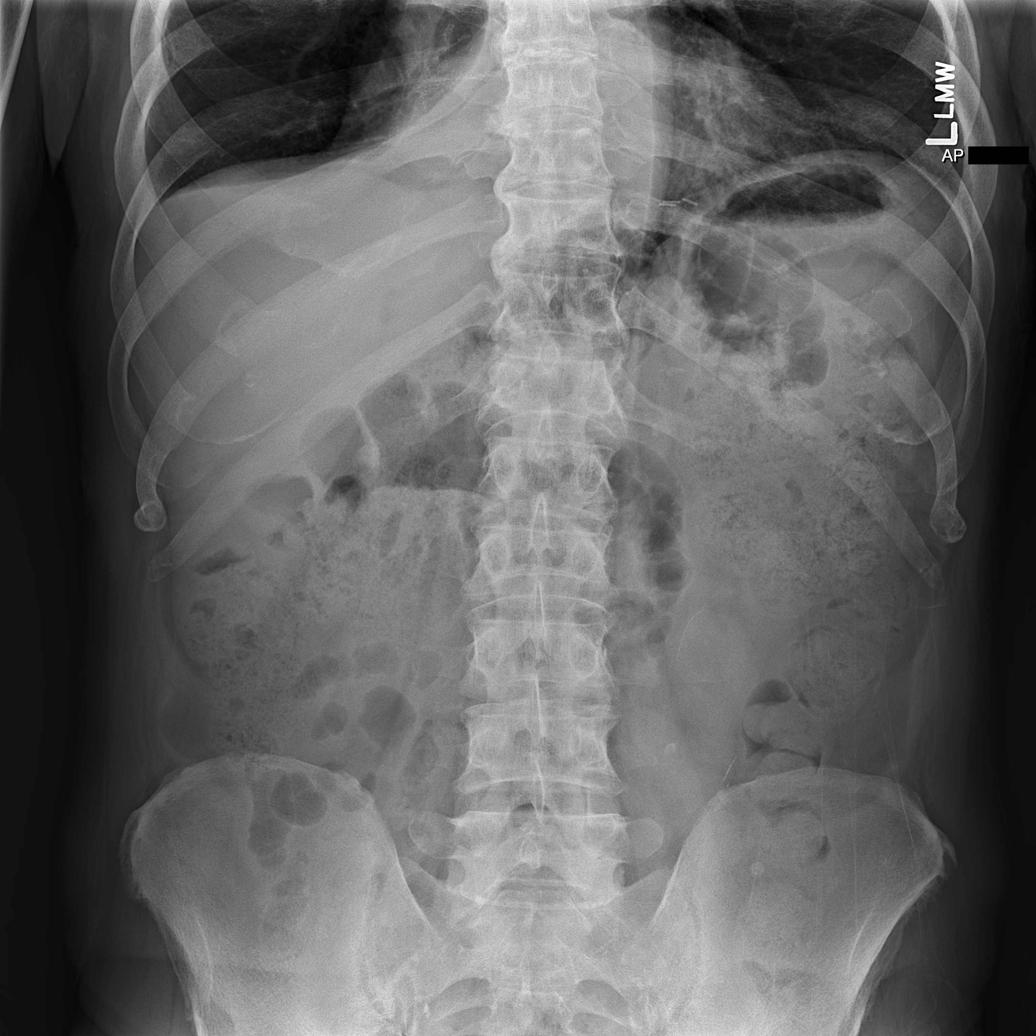
[im 3/4]
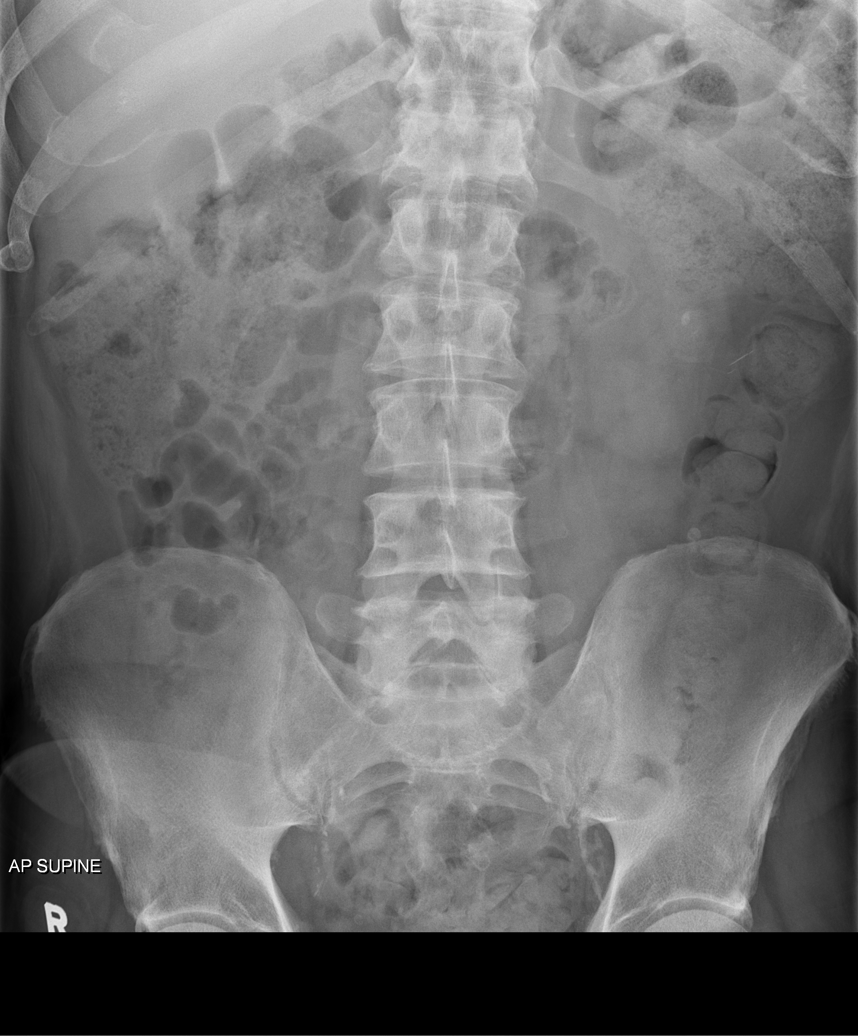
[im 4/4]
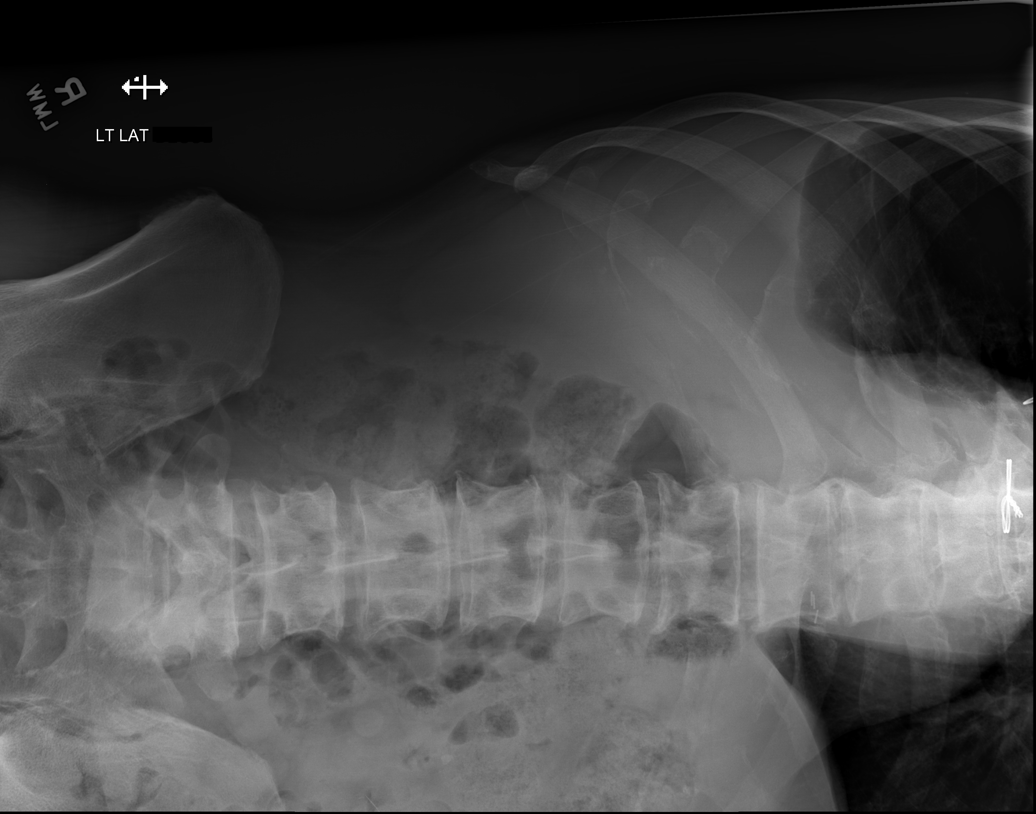

[4 of 4 positions shown; findings below may reference images not displayed]

FINDINGS: There is flattening of the hemidiaphragms and consolidative
density with a meniscal apex in the right lung base. Discoid atelectasis is
also identified. A right-sided Port-A-Cath is appreciated.

Air is seen within nondilated loops of large and small bowel as well as a
large amount of stool. The visualized bony skeleton is unremarkable.
IMPRESSION: 1. Nonobstructive bowel gas pattern with a large amount of stool.
2. Likely effusion right lung base as well as possibly discoid atelectasis.

## 2012-01-30 ENCOUNTER — Encounter: Payer: Self-pay | Admitting: Internal Medicine

## 2012-01-31 LAB — COMPREHENSIVE METABOLIC PANEL
Alkaline Phosphatase: 107 U/L (ref 50–136)
BUN: 20 mg/dL — ABNORMAL HIGH (ref 7–18)
Calcium, Total: 8.2 mg/dL — ABNORMAL LOW (ref 8.5–10.1)
Co2: 31 mmol/L (ref 21–32)
EGFR (African American): 58 — ABNORMAL LOW
EGFR (Non-African Amer.): 50 — ABNORMAL LOW
Potassium: 3.2 mmol/L — ABNORMAL LOW (ref 3.5–5.1)
SGPT (ALT): 24 U/L (ref 12–78)
Total Protein: 6.5 g/dL (ref 6.4–8.2)

## 2012-01-31 LAB — CBC CANCER CENTER
Basophil %: 0.3 %
Eosinophil #: 0 x10 3/mm (ref 0.0–0.7)
HCT: 28.7 % — ABNORMAL LOW (ref 40.0–52.0)
Lymphocyte #: 1.2 x10 3/mm (ref 1.0–3.6)
MCH: 27.7 pg (ref 26.0–34.0)
MCHC: 33.9 g/dL (ref 32.0–36.0)
MCV: 82 fL (ref 80–100)
Monocyte #: 0.3 x10 3/mm (ref 0.2–1.0)
Monocyte %: 4.2 %
Neutrophil #: 4.8 x10 3/mm (ref 1.4–6.5)
Neutrophil %: 76 %
Platelet: 61 x10 3/mm — ABNORMAL LOW (ref 150–440)

## 2012-02-01 ENCOUNTER — Inpatient Hospital Stay: Payer: Self-pay | Admitting: Hematology & Oncology

## 2012-02-01 LAB — CBC WITH DIFFERENTIAL/PLATELET
Basophil #: 0 10*3/uL (ref 0.0–0.1)
Basophil %: 0.1 %
Eosinophil %: 0.1 %
HGB: 10.4 g/dL — ABNORMAL LOW (ref 13.0–18.0)
Lymphocyte #: 1.7 10*3/uL (ref 1.0–3.6)
Lymphocyte %: 31.8 %
MCH: 27.9 pg (ref 26.0–34.0)
MCHC: 34.3 g/dL (ref 32.0–36.0)
MCV: 81 fL (ref 80–100)
Monocyte #: 0.4 x10 3/mm (ref 0.2–1.0)
Neutrophil #: 3.1 10*3/uL (ref 1.4–6.5)
Neutrophil %: 59.7 %
Platelet: 67 10*3/uL — ABNORMAL LOW (ref 150–440)
RBC: 3.72 10*6/uL — ABNORMAL LOW (ref 4.40–5.90)
RDW: 20.2 % — ABNORMAL HIGH (ref 11.5–14.5)
WBC: 5.3 10*3/uL (ref 3.8–10.6)

## 2012-02-01 LAB — COMPREHENSIVE METABOLIC PANEL
Albumin: 3.2 g/dL — ABNORMAL LOW (ref 3.4–5.0)
BUN: 17 mg/dL (ref 7–18)
Creatinine: 1.4 mg/dL — ABNORMAL HIGH (ref 0.60–1.30)
Glucose: 155 mg/dL — ABNORMAL HIGH (ref 65–99)
Potassium: 3.4 mmol/L — ABNORMAL LOW (ref 3.5–5.1)
SGOT(AST): 21 U/L (ref 15–37)
SGPT (ALT): 23 U/L (ref 12–78)
Total Protein: 6.8 g/dL (ref 6.4–8.2)

## 2012-02-01 LAB — MAGNESIUM: Magnesium: 2.3 mg/dL

## 2012-02-02 LAB — CBC WITH DIFFERENTIAL/PLATELET
Eosinophil #: 0 10*3/uL (ref 0.0–0.7)
Eosinophil %: 0.1 %
HCT: 28.1 % — ABNORMAL LOW (ref 40.0–52.0)
HGB: 9.7 g/dL — ABNORMAL LOW (ref 13.0–18.0)
Lymphocyte #: 1.2 10*3/uL (ref 1.0–3.6)
Lymphocyte %: 23.5 %
MCH: 28 pg (ref 26.0–34.0)
MCHC: 34.5 g/dL (ref 32.0–36.0)
MCV: 81 fL (ref 80–100)
Monocyte #: 0.6 x10 3/mm (ref 0.2–1.0)
Neutrophil %: 63.9 %
Platelet: 54 10*3/uL — ABNORMAL LOW (ref 150–440)

## 2012-02-02 LAB — BASIC METABOLIC PANEL
BUN: 10 mg/dL (ref 7–18)
EGFR (Non-African Amer.): >60
Glucose: 100 mg/dL — ABNORMAL HIGH (ref 65–99)
Osmolality: 275 (ref 275–301)
Potassium: 3.3 mmol/L — ABNORMAL LOW (ref 3.5–5.1)
Sodium: 138 mmol/L (ref 136–145)

## 2012-02-03 LAB — CBC WITH DIFFERENTIAL/PLATELET
Basophil #: 0 10*3/uL (ref 0.0–0.1)
Eosinophil #: 0 10*3/uL (ref 0.0–0.7)
Eosinophil %: 0.1 %
HCT: 29.4 % — ABNORMAL LOW (ref 40.0–52.0)
HGB: 10 g/dL — ABNORMAL LOW (ref 13.0–18.0)
Lymphocyte #: 0.7 10*3/uL — ABNORMAL LOW (ref 1.0–3.6)
Lymphocyte %: 22.3 %
MCH: 27.7 pg (ref 26.0–34.0)
MCHC: 34.1 g/dL (ref 32.0–36.0)
MCV: 81 fL (ref 80–100)
Monocyte #: 0.6 x10 3/mm (ref 0.2–1.0)
Neutrophil %: 59.8 %
Platelet: 67 10*3/uL — ABNORMAL LOW (ref 150–440)
RBC: 3.62 10*6/uL — ABNORMAL LOW (ref 4.40–5.90)
RDW: 20.4 % — ABNORMAL HIGH (ref 11.5–14.5)

## 2012-02-03 LAB — BASIC METABOLIC PANEL
Anion Gap: 8 (ref 7–16)
Calcium, Total: 8.4 mg/dL — ABNORMAL LOW (ref 8.5–10.1)
Chloride: 101 mmol/L (ref 98–107)
Co2: 25 mmol/L (ref 21–32)
Creatinine: 1.13 mg/dL (ref 0.60–1.30)
EGFR (African American): >60
Osmolality: 270 (ref 275–301)
Potassium: 3.1 mmol/L — ABNORMAL LOW (ref 3.5–5.1)
Sodium: 134 mmol/L — ABNORMAL LOW (ref 136–145)

## 2012-02-03 LAB — MAGNESIUM: Magnesium: 1.4 mg/dL — ABNORMAL LOW

## 2012-02-04 LAB — CBC WITH DIFFERENTIAL/PLATELET
Basophil #: 0 10*3/uL (ref 0.0–0.1)
Basophil %: 0.4 %
Eosinophil %: 0.2 %
Lymphocyte #: 1 10*3/uL (ref 1.0–3.6)
Lymphocyte %: 34.2 %
MCV: 81 fL (ref 80–100)
Monocyte %: 23.2 %
Neutrophil #: 1.2 10*3/uL — ABNORMAL LOW (ref 1.4–6.5)
Neutrophil %: 42 %
Platelet: 68 10*3/uL — ABNORMAL LOW (ref 150–440)
RBC: 3.08 10*6/uL — ABNORMAL LOW (ref 4.40–5.90)
RDW: 20.2 % — ABNORMAL HIGH (ref 11.5–14.5)
WBC: 3 10*3/uL — ABNORMAL LOW (ref 3.8–10.6)

## 2012-02-04 LAB — MAGNESIUM: Magnesium: 1.5 mg/dL — ABNORMAL LOW

## 2012-02-04 LAB — POTASSIUM: Potassium: 3.1 mmol/L — ABNORMAL LOW (ref 3.5–5.1)

## 2012-02-06 LAB — BASIC METABOLIC PANEL
Anion Gap: 8 (ref 7–16)
BUN: 11 mg/dL (ref 7–18)
Calcium, Total: 8.5 mg/dL (ref 8.5–10.1)
Co2: 26 mmol/L (ref 21–32)
EGFR (African American): >60
EGFR (Non-African Amer.): 53 — ABNORMAL LOW
Glucose: 259 mg/dL — ABNORMAL HIGH (ref 65–99)
Sodium: 133 mmol/L — ABNORMAL LOW (ref 136–145)

## 2012-02-06 LAB — CBC CANCER CENTER
Basophil #: 0 x10 3/mm (ref 0.0–0.1)
Eosinophil %: 0.3 %
HGB: 9.3 g/dL — ABNORMAL LOW (ref 13.0–18.0)
Lymphocyte #: 0.8 x10 3/mm — ABNORMAL LOW (ref 1.0–3.6)
Lymphocyte %: 37.7 %
MCV: 83 fL (ref 80–100)
Monocyte %: 35.4 %
Neutrophil %: 26.4 %
Platelet: 112 x10 3/mm — ABNORMAL LOW (ref 150–440)
RBC: 3.37 10*6/uL — ABNORMAL LOW (ref 4.40–5.90)
RDW: 20.3 % — ABNORMAL HIGH (ref 11.5–14.5)
WBC: 2.1 x10 3/mm — ABNORMAL LOW (ref 3.8–10.6)

## 2012-02-08 LAB — COMPREHENSIVE METABOLIC PANEL
Albumin: 2.7 g/dL — ABNORMAL LOW (ref 3.4–5.0)
Alkaline Phosphatase: 119 U/L (ref 50–136)
Bilirubin,Total: 0.2 mg/dL (ref 0.2–1.0)
Calcium, Total: 8.8 mg/dL (ref 8.5–10.1)
EGFR (African American): 54 — ABNORMAL LOW
EGFR (Non-African Amer.): 46 — ABNORMAL LOW
Glucose: 165 mg/dL — ABNORMAL HIGH (ref 65–99)
Osmolality: 274 (ref 275–301)
SGOT(AST): 17 U/L (ref 15–37)

## 2012-02-08 LAB — CBC CANCER CENTER
Basophil %: 0.4 %
Eosinophil %: 0.1 %
HCT: 28.8 % — ABNORMAL LOW (ref 40.0–52.0)
HGB: 9.5 g/dL — ABNORMAL LOW (ref 13.0–18.0)
Lymphocyte #: 1.3 x10 3/mm (ref 1.0–3.6)
MCH: 27.4 pg (ref 26.0–34.0)
MCHC: 33 g/dL (ref 32.0–36.0)
MCV: 83 fL (ref 80–100)
Monocyte #: 1 x10 3/mm (ref 0.2–1.0)
Neutrophil #: 0.9 x10 3/mm — ABNORMAL LOW (ref 1.4–6.5)
Platelet: 199 x10 3/mm (ref 150–440)
RBC: 3.48 10*6/uL — ABNORMAL LOW (ref 4.40–5.90)
WBC: 3.3 x10 3/mm — ABNORMAL LOW (ref 3.8–10.6)

## 2012-02-13 LAB — CBC CANCER CENTER
Basophil #: 0 x10 3/mm (ref 0.0–0.1)
Eosinophil %: 0.2 %
Lymphocyte #: 2.1 x10 3/mm (ref 1.0–3.6)
Lymphocyte %: 26.3 %
MCV: 83 fL (ref 80–100)
Monocyte %: 14.3 %
Neutrophil #: 4.7 x10 3/mm (ref 1.4–6.5)
Neutrophil %: 58.8 %
Platelet: 281 x10 3/mm (ref 150–440)
RBC: 3.68 10*6/uL — ABNORMAL LOW (ref 4.40–5.90)
RDW: 21 % — ABNORMAL HIGH (ref 11.5–14.5)
WBC: 8 x10 3/mm (ref 3.8–10.6)

## 2012-02-13 LAB — COMPREHENSIVE METABOLIC PANEL
Albumin: 2.8 g/dL — ABNORMAL LOW (ref 3.4–5.0)
BUN: 16 mg/dL (ref 7–18)
Bilirubin,Total: 0.2 mg/dL (ref 0.2–1.0)
Calcium, Total: 9 mg/dL (ref 8.5–10.1)
Chloride: 99 mmol/L (ref 98–107)
Creatinine: 1.54 mg/dL — ABNORMAL HIGH (ref 0.60–1.30)
Glucose: 176 mg/dL — ABNORMAL HIGH (ref 65–99)
Potassium: 4.4 mmol/L (ref 3.5–5.1)
SGOT(AST): 13 U/L — ABNORMAL LOW (ref 15–37)
SGPT (ALT): 18 U/L (ref 12–78)
Total Protein: 6.9 g/dL (ref 6.4–8.2)

## 2012-02-24 ENCOUNTER — Ambulatory Visit: Payer: Self-pay | Admitting: Oncology

## 2012-02-24 ENCOUNTER — Ambulatory Visit: Payer: Self-pay | Admitting: Internal Medicine

## 2012-04-11 ENCOUNTER — Other Ambulatory Visit: Payer: Self-pay | Admitting: Internal Medicine

## 2012-04-17 ENCOUNTER — Ambulatory Visit: Payer: BC Managed Care – PPO | Admitting: Internal Medicine

## 2012-04-21 ENCOUNTER — Ambulatory Visit: Payer: Self-pay | Admitting: Oncology

## 2012-04-21 ENCOUNTER — Ambulatory Visit (INDEPENDENT_AMBULATORY_CARE_PROVIDER_SITE_OTHER): Payer: BC Managed Care – PPO | Admitting: Internal Medicine

## 2012-04-21 ENCOUNTER — Encounter: Payer: Self-pay | Admitting: Internal Medicine

## 2012-04-21 VITALS — BP 138/84 | HR 73 | Temp 97.5°F | Resp 12 | Ht 69.5 in | Wt 158.5 lb

## 2012-04-21 DIAGNOSIS — E1129 Type 2 diabetes mellitus with other diabetic kidney complication: Secondary | ICD-10-CM

## 2012-04-21 DIAGNOSIS — I2510 Atherosclerotic heart disease of native coronary artery without angina pectoris: Secondary | ICD-10-CM

## 2012-04-21 DIAGNOSIS — R0600 Dyspnea, unspecified: Secondary | ICD-10-CM

## 2012-04-21 DIAGNOSIS — C801 Malignant (primary) neoplasm, unspecified: Secondary | ICD-10-CM

## 2012-04-21 DIAGNOSIS — Z85118 Personal history of other malignant neoplasm of bronchus and lung: Secondary | ICD-10-CM | POA: Insufficient documentation

## 2012-04-21 DIAGNOSIS — R0989 Other specified symptoms and signs involving the circulatory and respiratory systems: Secondary | ICD-10-CM

## 2012-04-21 LAB — COMPREHENSIVE METABOLIC PANEL
Albumin: 3.2 g/dL — ABNORMAL LOW (ref 3.4–5.0)
Alkaline Phosphatase: 122 U/L (ref 50–136)
Anion Gap: 6 — ABNORMAL LOW (ref 7–16)
Calcium, Total: 8.9 mg/dL (ref 8.5–10.1)
Chloride: 103 mmol/L (ref 98–107)
Creatinine: 1.68 mg/dL — ABNORMAL HIGH (ref 0.60–1.30)
Glucose: 182 mg/dL — ABNORMAL HIGH (ref 65–99)
Osmolality: 284 (ref 275–301)
SGOT(AST): 18 U/L (ref 15–37)
SGPT (ALT): 21 U/L (ref 12–78)
Sodium: 139 mmol/L (ref 136–145)

## 2012-04-21 LAB — CBC CANCER CENTER
Basophil #: 0.1 x10 3/mm (ref 0.0–0.1)
Basophil %: 0.8 %
Eosinophil #: 0.2 x10 3/mm (ref 0.0–0.7)
HCT: 40.7 % (ref 40.0–52.0)
HGB: 13.3 g/dL (ref 13.0–18.0)
Lymphocyte %: 26 %
Monocyte %: 8.9 %
Neutrophil #: 5.2 x10 3/mm (ref 1.4–6.5)
RDW: 16.6 % — ABNORMAL HIGH (ref 11.5–14.5)
WBC: 8.3 x10 3/mm (ref 3.8–10.6)

## 2012-04-21 LAB — HM DIABETES FOOT EXAM: HM Diabetic Foot Exam: NORMAL

## 2012-04-21 LAB — MAGNESIUM: Magnesium: 1.7 mg/dL — ABNORMAL LOW

## 2012-04-21 IMAGING — CT CT CHEST W/O CM
1 series · 14 of 33 positions shown, 18 images · non-contrast
Comparison: none

REASON FOR EXAM: restaging lung CA
COMMENTS:

PROCEDURE:     HAQIM - HAQIM CHEST WITHOUT CONTRAST  - [DATE]  [DATE]
RESULT:     Comparison: CT of the chest [DATE]
TECHNIQUE: Multiple axial images of the chest were obtained without
intravenous contrast.

[Series 2: soft tissue · axial · 0.72mm/px · z∈[-548,-248]mm · 14 of 118 slices shown, 18 images]
[im 9/118  mediastinal]
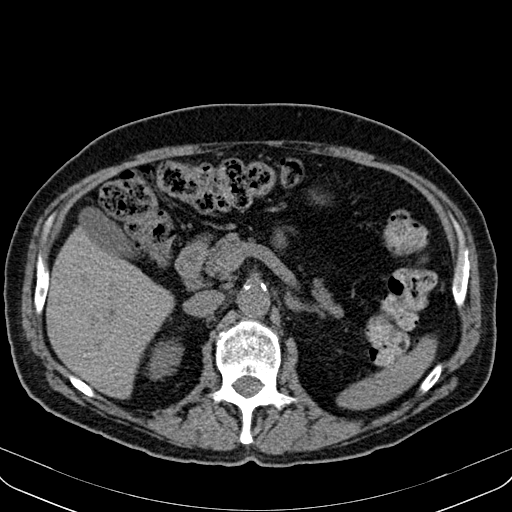
[im 9/118  lung]
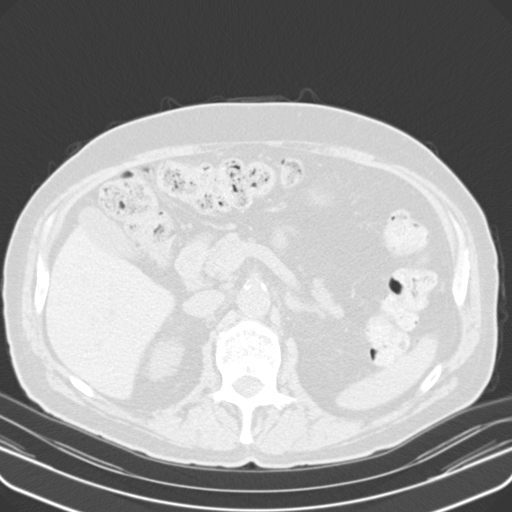
[im 18/118  lung]
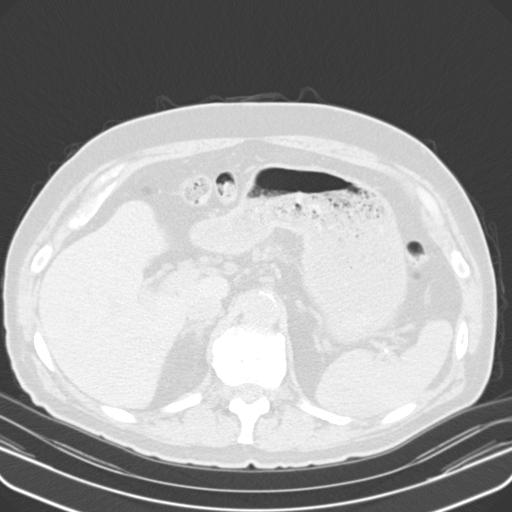
[im 24/118  lung]
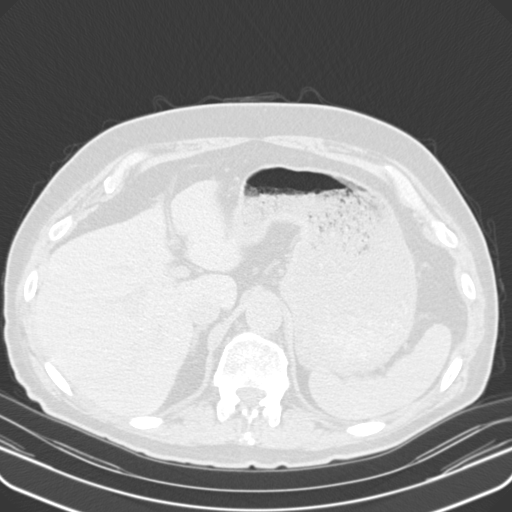
[im 31/118  lung]
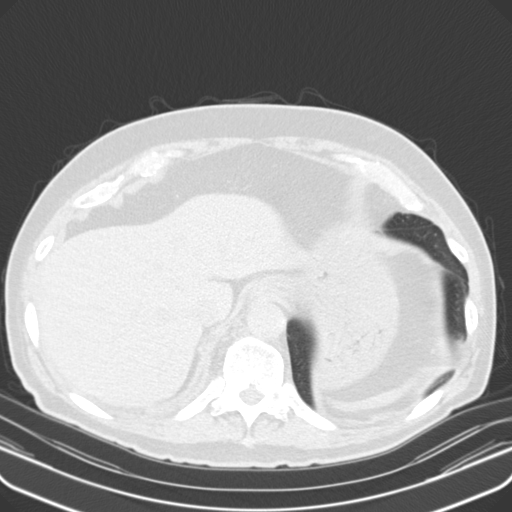
[im 40/118  mediastinal]
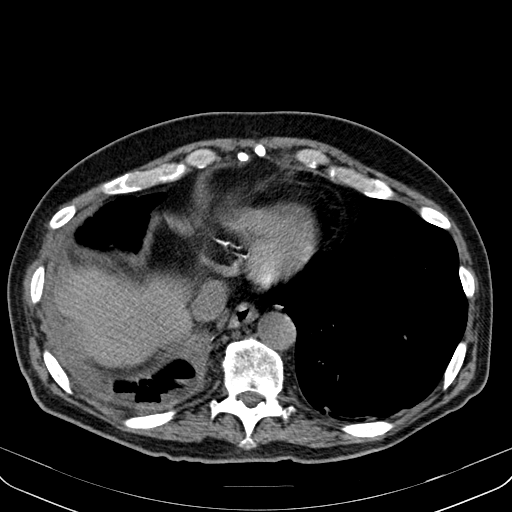
[im 40/118  lung]
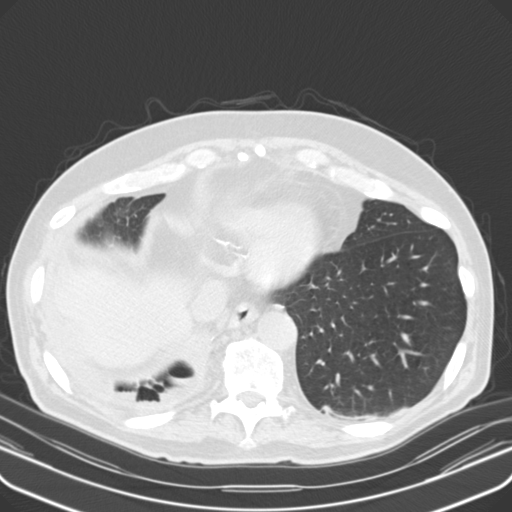
[im 48/118  lung]
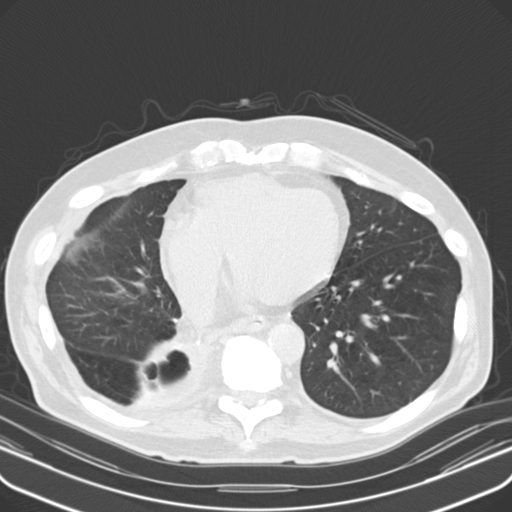
[im 57/118  lung]
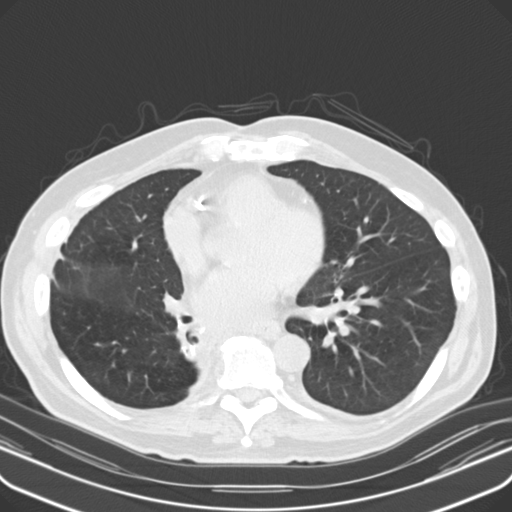
[im 62/118  lung]
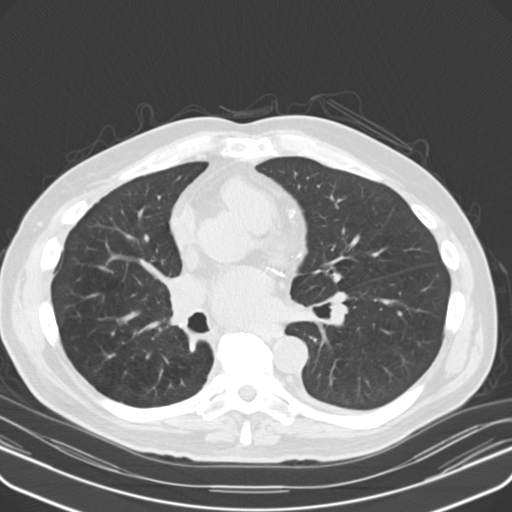
[im 70/118  mediastinal]
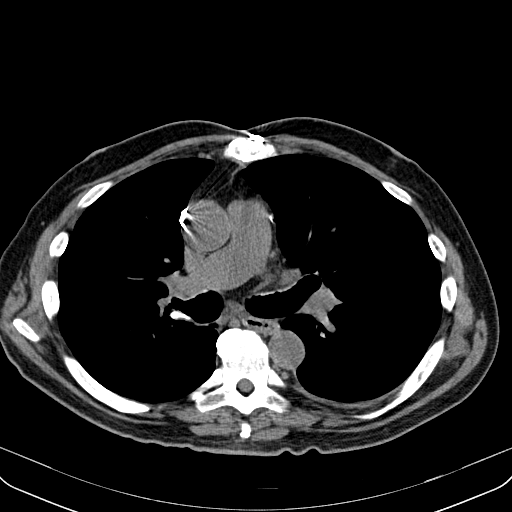
[im 70/118  lung]
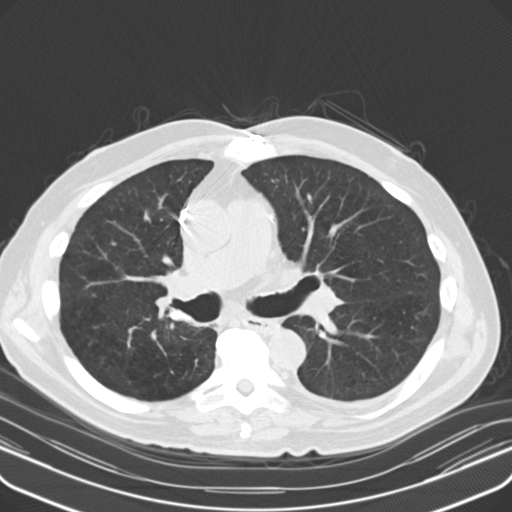
[im 79/118  lung]
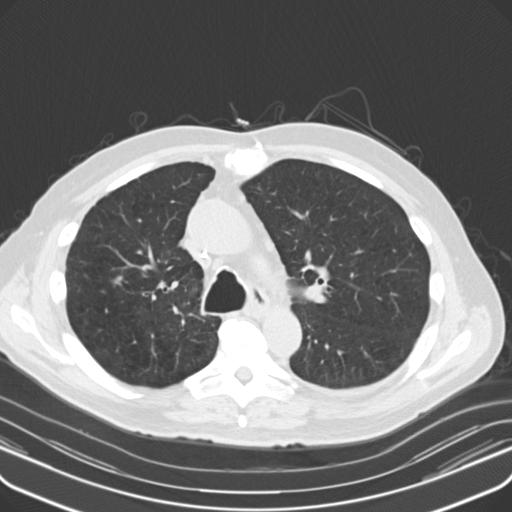
[im 87/118  lung]
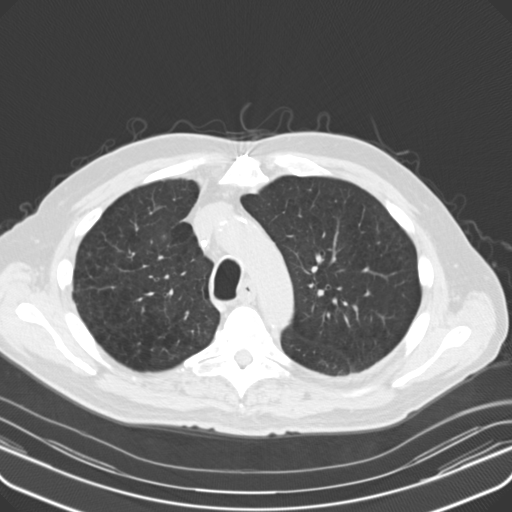
[im 94/118  lung]
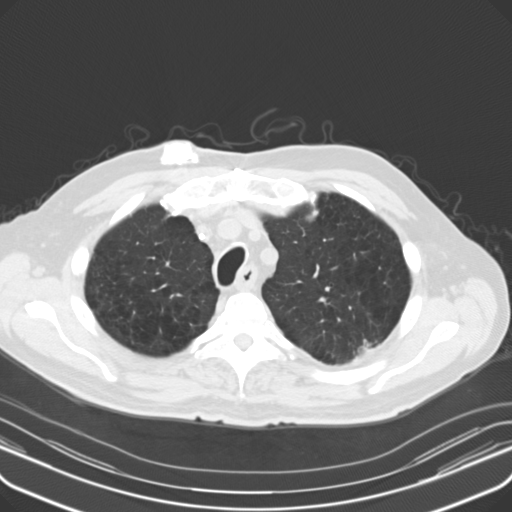
[im 100/118  mediastinal]
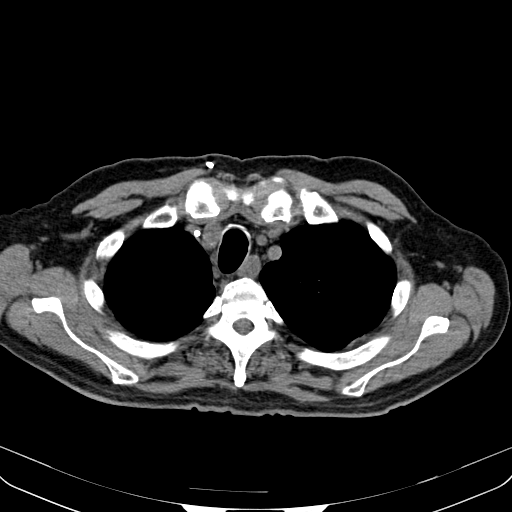
[im 100/118  lung]
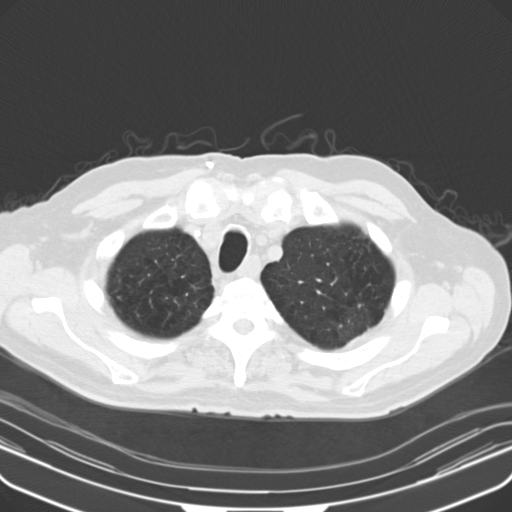
[im 109/118  lung]
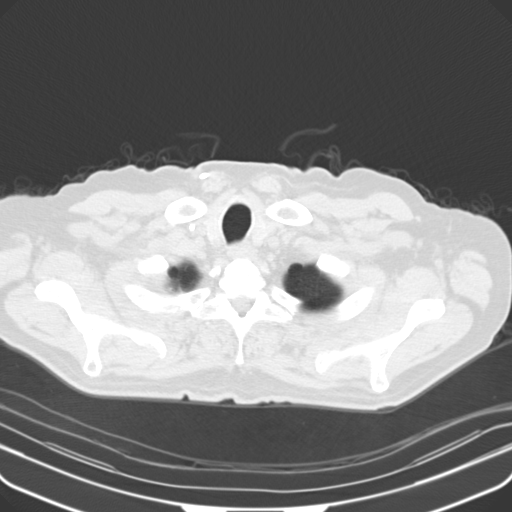

[14 of 33 positions shown; findings below may reference images not displayed]

FINDINGS: Small partially calcified nodule in the left thyroid gland is similar to
prior. This is nonspecific. No mediastinal, hilar, over axillary
lymphadenopathy. Right portacatheter tip terminates in the SVC.
Calcifications are seen in the coronary arteries. Postoperative changes seen
from right lower lobectomy. Mild soft tissue thickening along the posterior
aspect of the mediastinum at level is like related to postoperative changes.
This region measures approximately 3.3 x 2.5 cm. There is a small amount of
right pleural fluid. There are several small round foci of air in the
posterior right lung base in this region of pleural fluid.

There are likely small calcifications within the gallbladder. Small nodule
in the left adrenal gland is similar to prior and demonstrates density
consistent with an adrenal adenoma.

There is mild to moderate centrilobular emphysema. There is a 6 mm irregular
nodular density in the right upper lobe as seen on image 40. This is more
conspicuous than prior, but this may related to differences in right upper
lobe configuration related to the recent surgery. 6 mm nodular density at
the left costophrenic angle is similar to prior, image 86. This may
represent an area of scarring.

No aggressive lytic or sclerotic osseous lesions are identified.
IMPRESSION: 1. Interval right lower lobectomy. Soft tissue thickening along the
posterior medial aspect of the mediastinum and right hilum are likely
related to postoperative changes. Residual/recurrent malignancy would be
difficult to exclude. Continued followup is recommended.
2. Irregular 6 mm nodular density in the right upper lobe is slightly more
conspicuous than prior, though this difference may be related to differences
in lung positioning related to surgery. This is indeterminate. Continued
followup is recommended.
3. There is a small amount of right pleural fluid with multiple foci of
pleural based air at the right lung base. This may represent residual
loculated hydropneumothorax. However, an empyema or bronchopleural fistula
is not excluded. Clinical correlation is suggested.

## 2012-04-21 NOTE — Assessment & Plan Note (Signed)
Squamous Cell Lung CA Stage IIA  , s/p right lower lobectomy and chemotherapy.

## 2012-04-21 NOTE — Assessment & Plan Note (Signed)
His blood sugars are well controlled on diet alone.  hgb1ac to be ordered with next round  of Oncology labs.  Foot exam  Was normal today.

## 2012-04-21 NOTE — Progress Notes (Signed)
[name], male   DOB: 01-16-1946, 66 y.o.   MRN: [id number]  Patient Active Problem List  Diagnosis  . Polyp of colon, adenomatous  . Hyperlipidemia  . Hypertension  . CAD (coronary artery disease), autologous vein bypass graft  . Personal history of pneumonia  . Type II or unspecified type diabetes mellitus with renal manifestations, not stated as uncontrolled(250.40)  . Gout attack  . Cancer    Subjective:  CC:   Chief Complaint  Patient presents with  . Follow-up    HPI:   [name] a 66 y.o. male who presents   follow up on diabetes and  Lung CA .  Was hospitalized recently for dehydration secondary to protracted nausea and vomiting from the chemo was turned away by our office bc schedule was booked so he went to the Cancer center and they did a direct admit. 4 days.  finished the chemo x 6 treatments  In July.  Treatment stopped secondary to poor performance status .  He is checking his sugars weekly  And the range is 118 to 123,  Without meds..  He has gone back to work.  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  35 minutes one way,  Walks aroudn for 10 minutes before getting back in for return trip. Micah Flesher crazy sitting around at home.  PET scan was done today along with labs by Dr Orlie Dakin , with next lab check in 3 months.  Ha had all of his meds stopped except enalapril and flomax.  Has not started walking yet due to deconditioning.   Past Medical History  Diagnosis Date  . Diabetes mellitus   . Hyperlipidemia   . Hypertension   . Myocardial infarction   . Cancer 2012    Stage IIA squamous cell Lung Ca    Past Surgical History  Procedure Date  . Coronary artery bypass graft 2007  . Lung lobectomy          The following portions of the patient's history were reviewed and updated as appropriate: Allergies, current medications, and problem list.    Review of Systems:   12 Pt  review of systems was negative except those addressed in the HPI,     History   Social History  . Marital Status: Married    Spouse Name: N/A    Number of Children: N/A  . 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 Smoking status: Former Games developer  . Smokeless tobacco: Never Used  . Alcohol Use: No  . Drug Use: No  . Sexually Active: Not on file   Other Topics Concern  . Not on file   Social History Narrative  . No narrative on file    Objective:  BP 138/84  Pulse 73  Temp 97.5 F (36.4 C) (Oral)  Resp 12  Ht 5' 9.5" (1.765 m)  Wt 158 lb 8 oz (71.895 kg)  BMI 23.07 kg/m2  SpO2 96%  General appearance: alert, cooperative and appears stated age Ears: normal TM's and external ear canals both ears Throat: lips, mucosa, and tongue normal; teeth and gums normal Neck: no adenopathy, no carotid bruit, supple, symmetrical, trachea midline and thyroid not enlarged, symmetric, no tenderness/mass/nodules Back: symmetric, no curvature. ROM normal. No CVA tenderness. Lungs: clear to auscultation bilaterally Heart: regular rate and  rhythm, S1, S2 normal, no murmur, click, rub or gallop Abdomen: soft, non-tender; bowel sounds normal; no masses,  no organomegaly Pulses: 2+ and symmetric Skin: Skin color, texture, turgor normal. No rashes or lesions Lymph nodes: Cervical, supraclavicular, and axillary nodes normal.  Assessment and Plan:  Type II or unspecified type diabetes mellitus with renal manifestations, not stated as uncontrolled(250.40) His blood sugars are well controlled on diet alone.  hgb1ac to be ordered with next round  of Oncology labs.  Foot exam  Was normal today.   Cancer Squamous Cell Lung CA Stage IIA  , s/p right lower lobectomy and chemotherapy.    Updated Medication List Outpatient Encounter Prescriptions as of 04/21/2012  Medication Sig Dispense Refill  . enalapril (VASOTEC) 10 MG tablet  Take 1 tablet (10 mg total) by mouth daily.  90 tablet  1  . Tamsulosin HCl (FLOMAX) 0.4 MG CAPS TAKE ONE CAPSULE EVERY DAY  90 capsule  0  . BAYER CONTOUR TEST test strip USE AS DIRECTED  100 each  11  . HYDROcodone-acetaminophen (NORCO/VICODIN) 5-325 MG per tablet Take 1 tablet by mouth every 6 (six) hours as needed.      Marland Kitchen morphine (MS CONTIN) 15 MG 12 hr tablet Take 15 mg by mouth 2 (two) times daily.      Marland Kitchen zolpidem (AMBIEN) 10 MG tablet Take 10 mg by mouth at bedtime as needed.      Marland Kitchen DISCONTD: atorvastatin (LIPITOR) 40 MG tablet Take 40 mg by mouth daily.      Marland Kitchen DISCONTD: ondansetron (ZOFRAN) 8 MG tablet Take 8 mg by mouth every 8 (eight) hours as needed.      Marland Kitchen DISCONTD: prochlorperazine (COMPAZINE) 10 MG tablet Take 10 mg by mouth every 6 (six) hours as needed.         Orders Placed This Encounter  Procedures  . Ambulatory referral to Physical Therapy    No Follow-up on file.

## 2012-04-21 NOTE — Patient Instructions (Addendum)
Please ask Dr. Orlie Dakin to add a fasting lipid panel and a Hgba1C and CMET to your next lab draw.  If you will not be fasting next time,  Just have them do a direct LDL instead of the fasting lipid panel  Ask Dr Neale Burly if you can resume your baby aspirin daily  I am referring you to [facility] for Cardiopulmonary rehab to help get you back into walking shape

## 2012-04-25 ENCOUNTER — Ambulatory Visit: Payer: Self-pay | Admitting: Oncology

## 2012-05-15 LAB — HM DIABETES EYE EXAM: HM DIABETIC EYE EXAM: NORMAL

## 2012-07-18 ENCOUNTER — Ambulatory Visit: Payer: Self-pay | Admitting: Oncology

## 2012-07-18 IMAGING — CT CT CHEST W/O CM
1 series · 15 of 32 positions shown, 19 images · non-contrast
Comparison: none

REASON FOR EXAM: restaging lung CA
COMMENTS:

PROCEDURE:     CT  - CT CHEST WITHOUT CONTRAST  - [DATE] [DATE]
RESULT:
TECHNIQUE: CT of the chest without contrast is reconstructed at 3 mm slice
thickness in the axial plane and compared to previous images from [DATE] and [DATE].

[Series 2: soft tissue · axial · 0.74mm/px · z∈[-714,-414]mm · 15 of 112 slices shown, 19 images]
[im 8/112  soft-tissue]
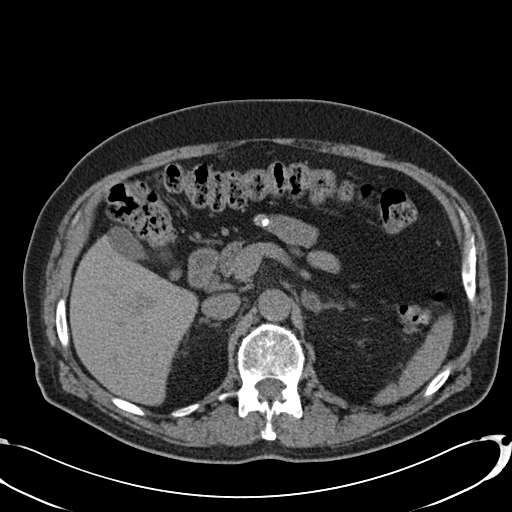
[im 8/112  bone]
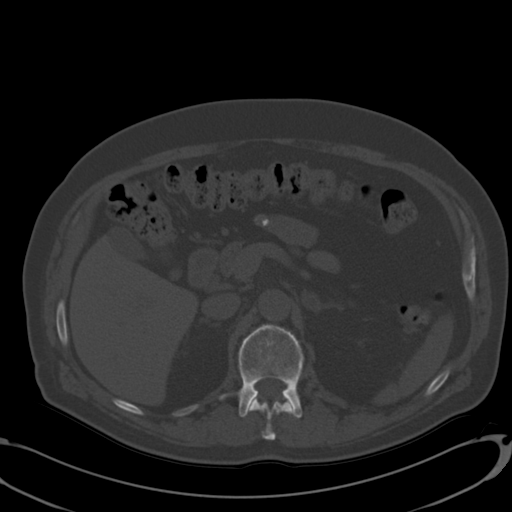
[im 15/112  soft-tissue]
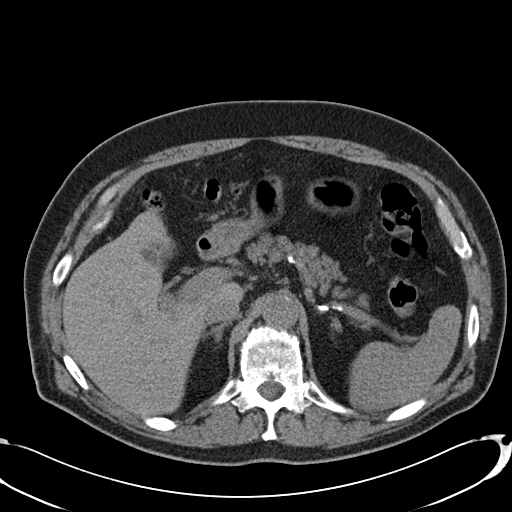
[im 22/112  soft-tissue]
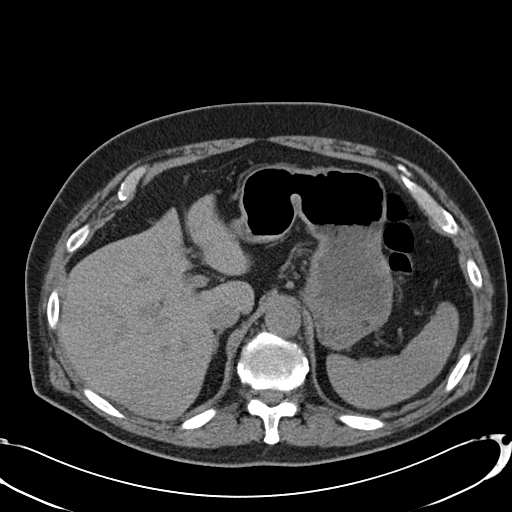
[im 33/112  soft-tissue]
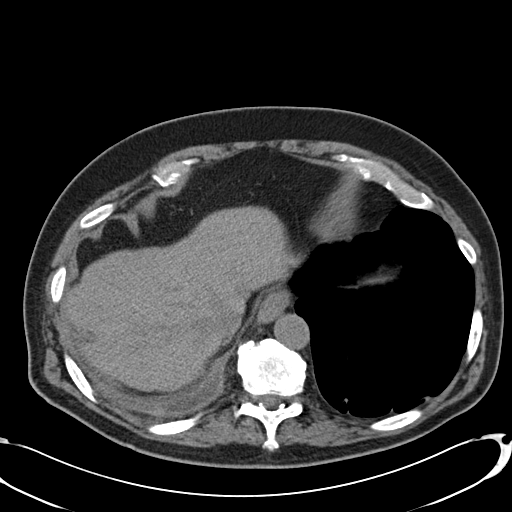
[im 40/112  soft-tissue]
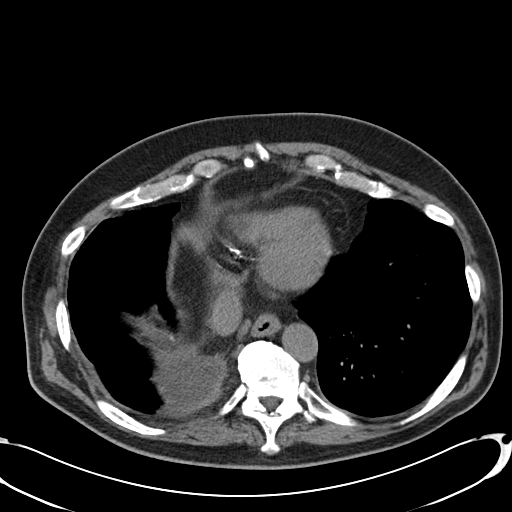
[im 47/112  soft-tissue]
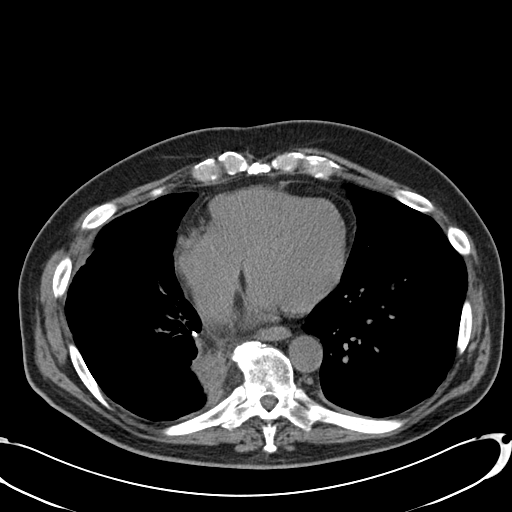
[im 58/112  soft-tissue]
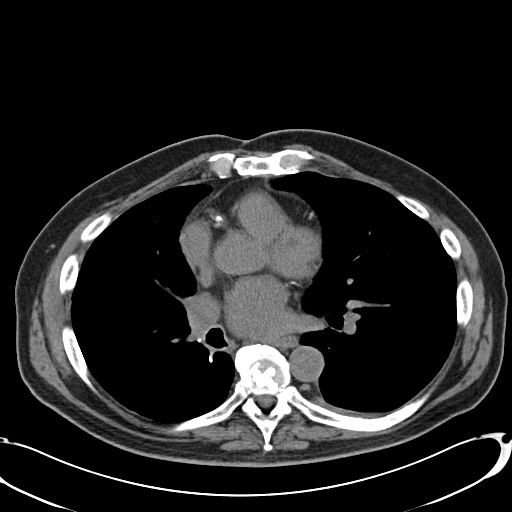
[im 65/112  soft-tissue]
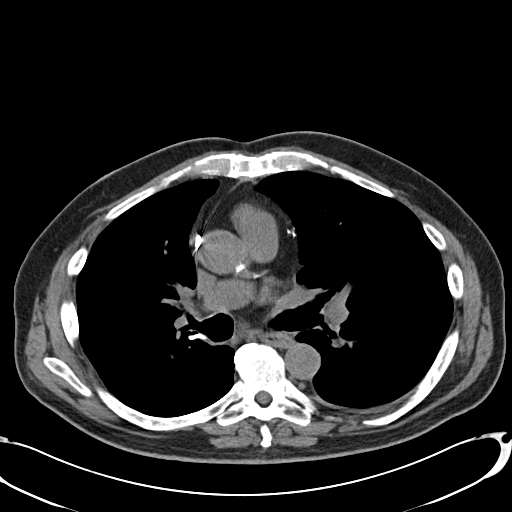
[im 72/112  soft-tissue]
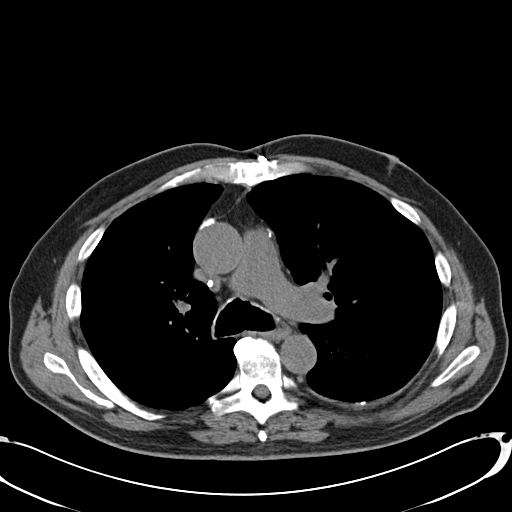
[im 72/112  bone]
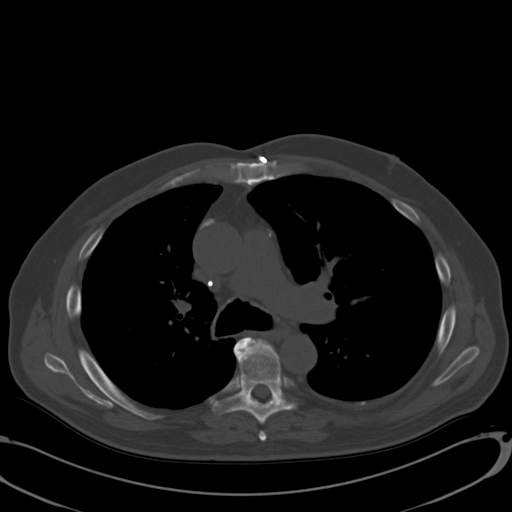
[im 79/112  soft-tissue]
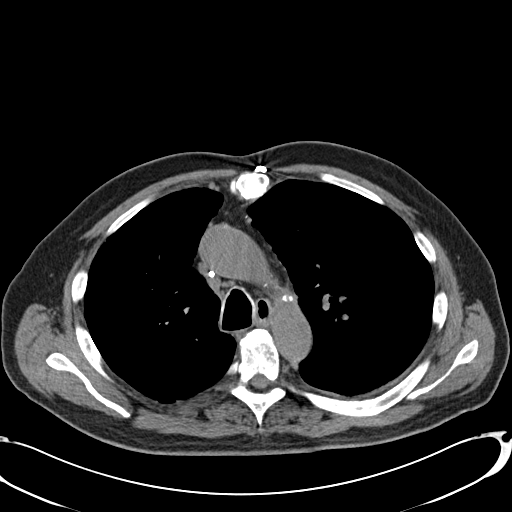
[im 90/112  soft-tissue]
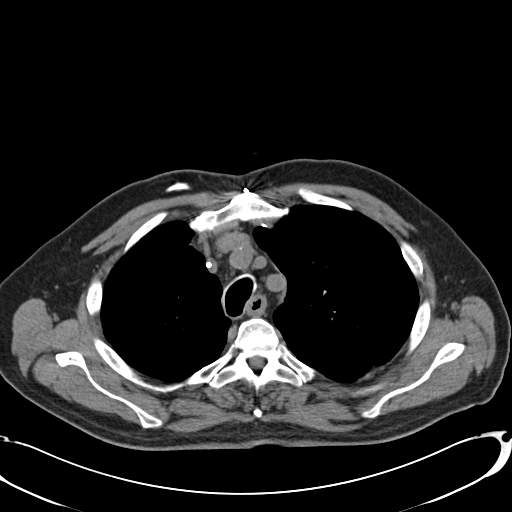
[im 97/112  soft-tissue]
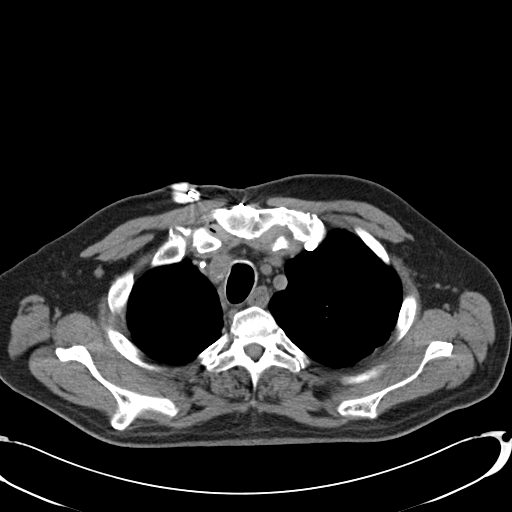
[im 97/112  lung]
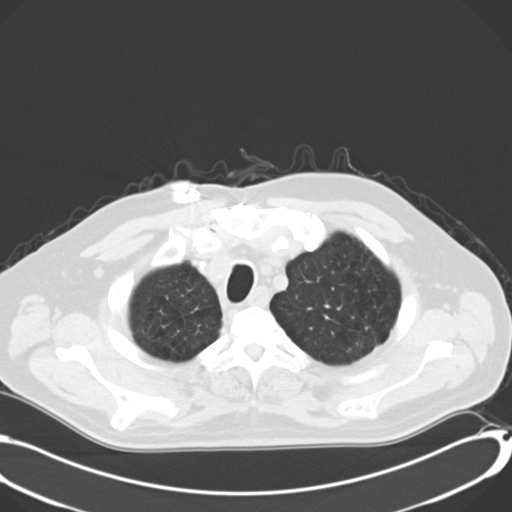
[im 101/112  lung]
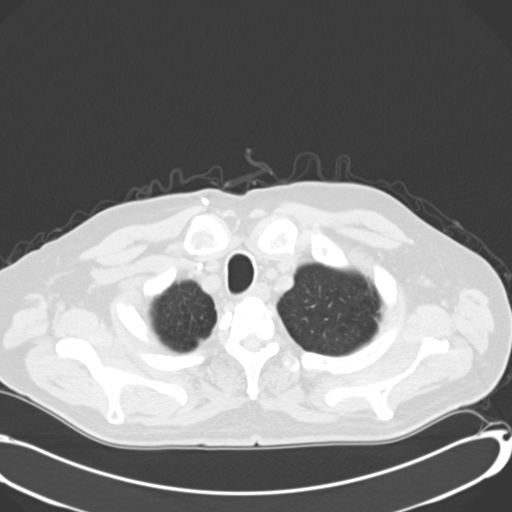
[im 104/112  soft-tissue]
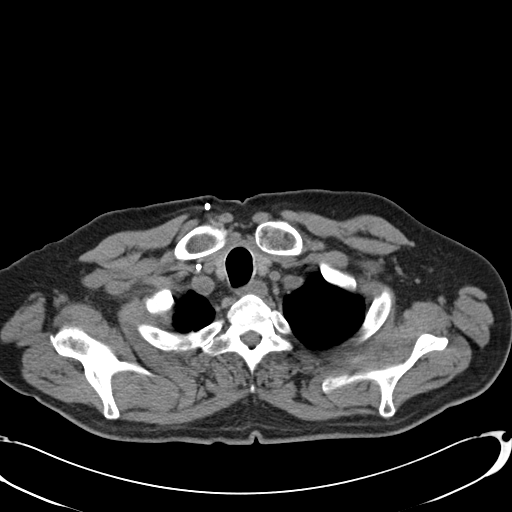
[im 104/112  lung]
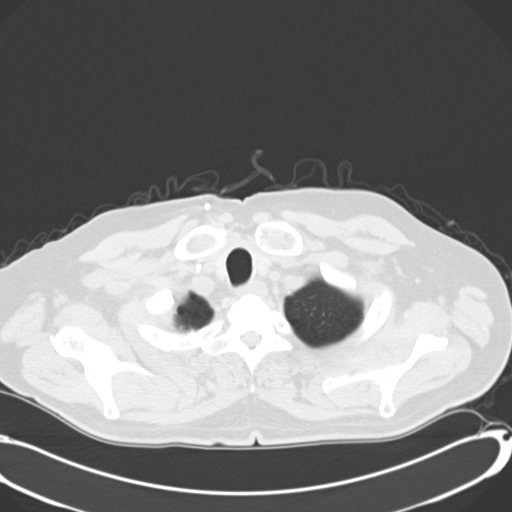
[im 108/112  lung]
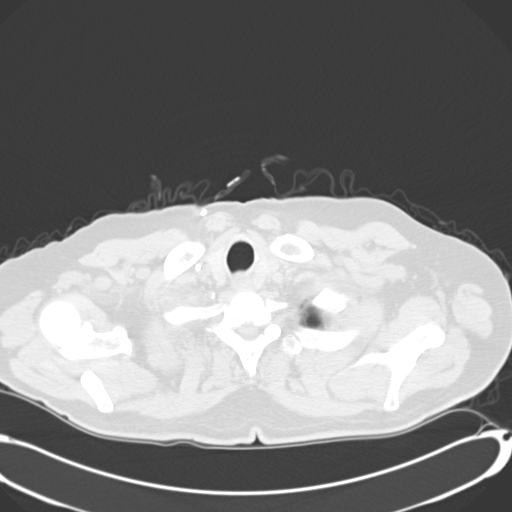

[15 of 32 positions shown; findings below may reference images not displayed]

FINDINGS: There is pleural thickening at the right lung base posterior
gutter with some central fluid. A well defined mass is not appreciated.
There is some density in the infrahilar paramediastinal posterior region of
the right hemithorax from image 52 to image 67. Some of this could be post
therapy or post operative. Extensive coronary artery calcification is noted.
There is underlying emphysematous lung disease. No new pulmonary mass is
appreciated. There is some minimal fibrosis at the left lung base
posteriorly and laterally with some linear density which could represent
subsegmental or discoid atelectasis or fibrosis bilaterally. No
endobronchial lesion is evident. There is cholelithiasis evident in the
upper abdominal images on image 102. There is minimal nodularity in the body
of the left adrenal gland which is unchanged compared to the previous study
and showing a long axis of 1.87 cm and a short axis of 1.20 cm on image 103.
Degenerative changes are present in the spine. Port-A-Cath device is present
with the tip of the catheter in the superior vena cava near the right atrium.
IMPRESSION: 1.     Some residual pleural thickening and fluid is seen in the area of
previous therapy. No well defined mass is evident. Other changes are present
as dictated above.
2.     There is evidence of cholelithiasis.
3.     There is a stable appearing, left adrenal nodule.
4.     There is underlying lung disease with some areas of presumed fibrosis.

[REDACTED]

## 2012-07-22 ENCOUNTER — Ambulatory Visit: Payer: BC Managed Care – PPO | Admitting: Internal Medicine

## 2012-07-30 ENCOUNTER — Encounter: Payer: Self-pay | Admitting: Internal Medicine

## 2012-07-30 ENCOUNTER — Ambulatory Visit: Payer: Self-pay | Admitting: Oncology

## 2012-07-30 ENCOUNTER — Ambulatory Visit: Payer: Self-pay | Admitting: Internal Medicine

## 2012-07-30 ENCOUNTER — Other Ambulatory Visit: Payer: Self-pay | Admitting: General Practice

## 2012-07-30 ENCOUNTER — Telehealth: Payer: Self-pay | Admitting: Internal Medicine

## 2012-07-30 ENCOUNTER — Ambulatory Visit (INDEPENDENT_AMBULATORY_CARE_PROVIDER_SITE_OTHER): Payer: BC Managed Care – PPO | Admitting: Internal Medicine

## 2012-07-30 VITALS — BP 128/72 | HR 81 | Temp 97.6°F | Resp 16 | Wt 169.5 lb

## 2012-07-30 DIAGNOSIS — C801 Malignant (primary) neoplasm, unspecified: Secondary | ICD-10-CM

## 2012-07-30 DIAGNOSIS — I10 Essential (primary) hypertension: Secondary | ICD-10-CM

## 2012-07-30 DIAGNOSIS — E1129 Type 2 diabetes mellitus with other diabetic kidney complication: Secondary | ICD-10-CM

## 2012-07-30 DIAGNOSIS — I25810 Atherosclerosis of coronary artery bypass graft(s) without angina pectoris: Secondary | ICD-10-CM

## 2012-07-30 DIAGNOSIS — J40 Bronchitis, not specified as acute or chronic: Secondary | ICD-10-CM

## 2012-07-30 LAB — COMPREHENSIVE METABOLIC PANEL
Alkaline Phosphatase: 152 U/L — ABNORMAL HIGH (ref 50–136)
Anion Gap: 10 (ref 7–16)
BUN: 26 mg/dL — ABNORMAL HIGH (ref 7–18)
Calcium, Total: 9.1 mg/dL (ref 8.5–10.1)
Chloride: 107 mmol/L (ref 98–107)
Co2: 27 mmol/L (ref 21–32)
Creatinine: 2.08 mg/dL — ABNORMAL HIGH (ref 0.60–1.30)
Osmolality: 294 (ref 275–301)
Potassium: 4.7 mmol/L (ref 3.5–5.1)
SGOT(AST): 19 U/L (ref 15–37)
Sodium: 144 mmol/L (ref 136–145)
Total Protein: 7.7 g/dL (ref 6.4–8.2)

## 2012-07-30 LAB — CBC CANCER CENTER
Basophil #: 0.1 x10 3/mm (ref 0.0–0.1)
Basophil %: 0.6 %
HGB: 14.1 g/dL (ref 13.0–18.0)
Lymphocyte %: 25.1 %
Monocyte %: 11.2 %
Neutrophil %: 60.3 %

## 2012-07-30 LAB — LIPID PANEL
Cholesterol: 214 mg/dL — ABNORMAL HIGH (ref 0–200)
Ldl Cholesterol, Calc: 131 mg/dL — ABNORMAL HIGH (ref 0–100)
VLDL Cholesterol, Calc: 50 mg/dL — ABNORMAL HIGH (ref 5–40)

## 2012-07-30 MED ORDER — ASPIRIN 81 MG PO TBEC: 81.0000 mg | DELAYED_RELEASE_TABLET | Freq: Every day | ORAL | Status: DC

## 2012-07-30 MED ORDER — AZITHROMYCIN 500 MG PO TABS: 500.0000 mg | ORAL_TABLET | Freq: Every day | ORAL | Status: DC

## 2012-07-30 MED ORDER — ENALAPRIL MALEATE 10 MG PO TABS: 10.0000 mg | ORAL_TABLET | Freq: Every day | ORAL | Status: DC

## 2012-07-30 MED ORDER — TAMSULOSIN HCL 0.4 MG PO CAPS: ORAL_CAPSULE | ORAL | Status: DC

## 2012-07-30 NOTE — Telephone Encounter (Signed)
Completed and placed in the fax folder.

## 2012-07-30 NOTE — Progress Notes (Signed)
[name], male   DOB: [birth year], 67 y.o.   MRN: [id number]  Patient Active Problem List  Diagnosis  . Polyp of colon, adenomatous  . Hyperlipidemia  . Hypertension  . CAD (coronary artery disease), autologous vein bypass graft  . Personal history of pneumonia  . Type II or unspecified type diabetes mellitus with renal manifestations, not stated as uncontrolled(250.40)  . Cancer    Subjective:  CC:   Chief Complaint  Patient presents with  . Follow-up    HPI:   Nathaniel Lane a 67 y.o. male who presents 3 month follow up on diabetes mellitus, lung cancer, hypertension.  He feels great,  Has gained back much of the weight he lost during chemo.  Appetite is excellent and he has had a few dietary indiscretions,. hgba1c was  6.7  LDL 131 nonfasting by today's labs.  Recent chest Ct showed no progression of disease.     Past Medical History  Diagnosis Date  . Diabetes mellitus   . Hyperlipidemia   . Hypertension   . Myocardial infarction   . Cancer 2012    Stage IIA squamous cell Lung Ca    Past Surgical History  Procedure Date  . Coronary artery bypass graft 2007  . Lung lobectomy          The following portions of the patient's history were reviewed and updated as appropriate: Allergies, current medications, and problem list.    Review of Systems:   Patient denies headache, fevers, malaise, unintentional weight loss, skin rash, eye pain, sinus congestion and sinus pain, sore throat, dysphagia,  hemoptysis , cough, dyspnea, wheezing, chest pain, palpitations, orthopnea, edema, abdominal pain, nausea, melena, diarrhea, constipation, flank pain, dysuria, hematuria, urinary  Frequency, nocturia, numbness, tingling, seizures,  Focal weakness, Loss of consciousness,  Tremor, insomnia, depression, anxiety, and suicidal ideation.     History   Social History  . Marital Status: Married    Spouse Name: N/A    Number of Children: N/A  . Years of  Education: N/A   Occupational History  . Parts Delivery    Social History Main Topics  . Smoking status: Former Games developer  . Smokeless tobacco: Never Used  . Alcohol Use: No  . Drug Use: No  . Sexually Active: Not on file   Other Topics Concern  . Not on file   Social History Narrative  . No narrative on file    Objective:  BP 128/72  Pulse 81  Temp 97.6 F (36.4 C) (Oral)  Resp 16  Wt 169 lb 8 oz (76.885 kg)  SpO2 95%  General appearance: alert, cooperative and appears stated age Ears: normal TM's and external ear canals both ears Throat: lips, mucosa, and tongue normal; teeth and gums normal Neck: no adenopathy, no carotid bruit, supple, symmetrical, trachea midline and thyroid not enlarged, symmetric, no tenderness/mass/nodules Back: symmetric, no curvature. ROM normal. No CVA tenderness. Lungs: clear to auscultation bilaterally Heart: regular rate and rhythm, S1, S2 normal, no murmur, click, rub or gallop Abdomen: soft, non-tender; bowel sounds normal; no masses,  no organomegaly Pulses: 2+ and symmetric Skin: Skin color, texture, turgor normal. No rashes or lesions Lymph nodes: Cervical, supraclavicular, and axillary nodes normal.  Assessment and Plan:  Type II or unspecified type diabetes mellitus with renal manifestations, not stated as uncontrolled(250.40) Well controlled on current regimen, HgbA1c < 7.0 on diet alone.  No changes  Today.  Hypertension Well controlled on current regimen. Renal function stable,  no changes today.  CAD (coronary artery disease), autologous vein bypass graft Asymptomatic.  Continue current medications.  Cancer Repeat CT of chest done recently shows no progression.    Updated Medication List Outpatient Encounter Prescriptions as of 07/30/2012  Medication Sig Dispense Refill  . enalapril (VASOTEC) 10 MG tablet Take 1 tablet (10 mg total) by mouth daily.  90 tablet  1  . Tamsulosin HCl (FLOMAX) 0.4 MG CAPS TAKE ONE CAPSULE  EVERY DAY  90 capsule  0  . [DISCONTINUED] enalapril (VASOTEC) 10 MG tablet Take 1 tablet (10 mg total) by mouth daily.  90 tablet  1  . [DISCONTINUED] Tamsulosin HCl (FLOMAX) 0.4 MG CAPS TAKE ONE CAPSULE EVERY DAY  90 capsule  0  . aspirin 81 MG EC tablet Take 1 tablet (81 mg total) by mouth daily. Swallow whole.  30 tablet  12  . azithromycin (ZITHROMAX) 500 MG tablet Take 1 tablet (500 mg total) by mouth daily.  7 tablet  0  . BAYER CONTOUR TEST test strip USE AS DIRECTED  100 each  11  . [DISCONTINUED] HYDROcodone-acetaminophen (NORCO/VICODIN) 5-325 MG per tablet Take 1 tablet by mouth every 6 (six) hours as needed.      . [DISCONTINUED] morphine (MS CONTIN) 15 MG 12 hr tablet Take 15 mg by mouth 2 (two) times daily.      . [DISCONTINUED] zolpidem (AMBIEN) 10 MG tablet Take 10 mg by mouth at bedtime as needed.         No orders of the defined types were placed in this encounter.    No Follow-up on file.

## 2012-07-30 NOTE — Telephone Encounter (Signed)
Nathaniel Lane called from [facility] Out patient needing diagnosis codes for lab order that was sent over.  Paper work given to [employer]

## 2012-07-30 NOTE — Patient Instructions (Addendum)
Your diabetes is under good control without medications, but you need to cut  Back on the starches a little bit because your blood sugar was over 200 this morning  I am prescribing azithromycin,  An antibiotic for 7 days for your bronchitis.   This is  my version of a  "Low GI"  Diet:  It is not ultra low carb, but will still lower your blood sugars and allow you to lose 5 to 10 lbs per month if you follow it carefully. All of the foods can be found at grocery stores and in bulk at [employer].  The Atkins protein bars and shakes are available in more varieties at [employer], [employer] and [employer].     7 AM Breakfast:  Low carbohydrate Protein  Shakes (I recommend the EAS AdvantEdge "Carb Control" shakes  Or the low carb shakes by Atkins.   Both are available everywhere:  In  cases at [employer]Js  Or in 4 packs at grocery stores and pharmacies  2.5 carbs  (Alternative is  a toasted Arnold's Sandwhich Thin w/ peanut butter, a "Bagel Thin" with cream cheese and salmon) or  a scrambled egg burrito made with a low carb tortilla .  Avoid cereal and bananas, oatmeal too unless you are cooking the old fashioned kind that takes 30-40 minutes to prepare.  the rest is overly processed, has minimal fiber, and is loaded with carbohydrates!   10 AM: Protein bar by Atkins (the snack size, under 200 cal).  There are many varieties , available widely again or in bulk in limited varieties at [employer]Js)  Other so called "protein bars" tend to be loaded with carbohydrates.  Remember, in food advertising, the word "energy" is synonymous for " carbohydrate."  Lunch: sandwich of [place], (or any lunchmeat, grilled meat or canned tuna), fresh avocado, mayonnaise  and cheese on a lower carbohydrate pita bread, flatbread, or tortilla . Ok to use regular mayonnaise. The bread is the only source or carbohydrate that can be decreased (Joseph's makes a pita bread and a flat bread that are 50 cal and 4 net carbs ; Toufayan makes a low carb flatbread  that's 100 cal and 9 net carbs  and  Mission makes a low carb whole wheat tortilla  That is 210 cal and 6 net carbs)  3 PM:  Mid day :  Another protein bar,  Or a  cheese stick (100 cal, 0 carbs),  Or 1 ounce of  almonds, walnuts, pistachios, pecans, peanuts,  Macadamia nuts. Or a Dannon light n Fit greek yogurt, 80 cal 8 net carbs . Avoid "granola"; the dried cranberries and raisins are loaded with carbohydrates. Mixed nuts ok if no raisins or cranberries or dried fruit.      6 PM  Dinner:  "mean and green:"  Meat/chicken/fish or a high protein legume; , with a green salad, and a low GI  Veggie (broccoli, cauliflower, green beans, spinach, brussel sprouts. Lima beans) : Avoid "Low fat dressings, as well as Reyne Dumas and [address]! They are loaded with sugar! Instead use ranch, vinagrette,  Blue cheese, etc.  There is a low carb pasta by Dreamfield's available at [facility] that is acceptable and tastes great. Try Michel Angel's chicken piccata over low carb pasta. The chicken dish is 0 carbs, and can be found in frozen section at BJs and [employer]. Also try [employer]" (pulled pork, no sauce,  0 carbs) and his pot roast.   both are  in the refrigerated section at BJs   Dreamfield's makes a low carb pasta only 5 g/serving.  Available at all grocery stores,  And tastes like normal pasta  9 PM snack : Breyer's "low carb" fudgsicle or  ice cream bar (Carb Smart line), or  Weight Watcher's ice cream bar , or another "no sugar added" ice cream;a serving of fresh berries/cherries with whipped cream (Avoid bananas, pineapple, grapes  and watermelon on a regular basis because they are high in sugar)   Remember that snack Substitutions should be less than 10 carbs per serving and meals < 20 carbs. Remember to subtract fiber grams and sugar alcohols to get the "net carbs."

## 2012-07-31 ENCOUNTER — Encounter: Payer: Self-pay | Admitting: Internal Medicine

## 2012-07-31 NOTE — Assessment & Plan Note (Signed)
Well controlled on current regimen, HgbA1c < 7.0 on diet alone.  No changes  Today.

## 2012-07-31 NOTE — Assessment & Plan Note (Signed)
Repeat CT of chest done recently shows no progression.

## 2012-07-31 NOTE — Assessment & Plan Note (Signed)
Asymptomatic.  Continue current medications. 

## 2012-07-31 NOTE — Assessment & Plan Note (Signed)
Well controlled on current regimen. Renal function stable, no changes today. 

## 2012-08-01 ENCOUNTER — Telehealth: Payer: Self-pay | Admitting: Internal Medicine

## 2012-08-07 LAB — LIPID PANEL
Cholesterol: 214 mg/dL — AB (ref 0–200)
HDL: 33 mg/dL — AB (ref 35–70)
LDL Cholesterol: 131 mg/dL

## 2012-08-07 LAB — BASIC METABOLIC PANEL: BUN: 26 mg/dL — AB (ref 4–21)

## 2012-08-07 LAB — HEPATIC FUNCTION PANEL: AST: 19 U/L (ref 14–40)

## 2012-08-23 ENCOUNTER — Ambulatory Visit: Payer: Self-pay | Admitting: Oncology

## 2012-09-09 ENCOUNTER — Ambulatory Visit: Payer: Self-pay | Admitting: Surgery

## 2012-09-09 LAB — CBC WITH DIFFERENTIAL/PLATELET
Eosinophil %: 3 %
HCT: 42.8 % (ref 40.0–52.0)
HGB: 14.4 g/dL (ref 13.0–18.0)
Lymphocyte %: 26.2 %
MCH: 29.5 pg (ref 26.0–34.0)
MCHC: 33.6 g/dL (ref 32.0–36.0)
MCV: 88 fL (ref 80–100)
Monocyte #: 0.9 x10 3/mm (ref 0.2–1.0)
Monocyte %: 10 %
Platelet: 149 10*3/uL — ABNORMAL LOW (ref 150–440)
RBC: 4.87 10*6/uL (ref 4.40–5.90)
WBC: 9.4 10*3/uL (ref 3.8–10.6)

## 2012-09-09 LAB — BASIC METABOLIC PANEL
EGFR (African American): 51 — ABNORMAL LOW
Osmolality: 285 (ref 275–301)
Potassium: 4.4 mmol/L (ref 3.5–5.1)

## 2012-09-17 ENCOUNTER — Ambulatory Visit: Payer: Self-pay | Admitting: Surgery

## 2012-09-18 LAB — CREATININE, SERUM: EGFR (Non-African Amer.): 50 — ABNORMAL LOW

## 2012-09-18 LAB — ALBUMIN: Albumin: 2.8 g/dL — ABNORMAL LOW (ref 3.4–5.0)

## 2012-10-27 ENCOUNTER — Ambulatory Visit (INDEPENDENT_AMBULATORY_CARE_PROVIDER_SITE_OTHER): Payer: BC Managed Care – PPO | Admitting: Internal Medicine

## 2012-10-27 ENCOUNTER — Encounter: Payer: Self-pay | Admitting: Internal Medicine

## 2012-10-27 VITALS — BP 144/84 | HR 78 | Temp 97.7°F | Resp 16 | Wt 178.5 lb

## 2012-10-27 DIAGNOSIS — C73 Malignant neoplasm of thyroid gland: Secondary | ICD-10-CM

## 2012-10-27 DIAGNOSIS — E1129 Type 2 diabetes mellitus with other diabetic kidney complication: Secondary | ICD-10-CM

## 2012-10-27 DIAGNOSIS — E785 Hyperlipidemia, unspecified: Secondary | ICD-10-CM

## 2012-10-27 MED ORDER — TAMSULOSIN HCL 0.4 MG PO CAPS: ORAL_CAPSULE | ORAL | Status: DC

## 2012-10-27 NOTE — Assessment & Plan Note (Signed)
Dr Juliann Pares wants to resume the lipitor  As soon as possible.

## 2012-10-27 NOTE — Progress Notes (Signed)
[name], male   DOB: 08-17-1945, 67 y.o.   MRN: [id number]  Patient Active Problem List   Diagnosis Date Noted  . Thyroid cancer   . Cancer   . CAD (coronary artery disease), autologous vein bypass graft 07/12/2011  . Personal history of pneumonia 07/12/2011  . Type II or unspecified type diabetes mellitus with renal manifestations, not stated as uncontrolled(250.40) 07/12/2011  . Hyperlipidemia   . Hypertension   . Polyp of colon, adenomatous 04/08/2011    Subjective:  CC:   Chief Complaint  Patient presents with  . Follow-up    HPI:   Nathaniel Lane a 67 y.o. male who presents for diabetes followup.   Diagnosed with thyroid cancer by biopsy. Was present when the lung ca was diagnosed last year but treatment postponed.   Had a total thyroidectomy March 26th and has been taking Synthroid since then, with Endocrine follow up with [facility] planned.  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     Past Medical History  Diagnosis Date  . Diabetes mellitus   . Hyperlipidemia   . Hypertension   . Myocardial infarction   . Cancer 2012    Stage IIA squamous cell Lung Ca  . Thyroid cancer 2013    s/p thyroidectomy 2014    Past Surgical History  Procedure Laterality Date  . Coronary artery bypass graft  2007  . Lung lobectomy         The following portions of the patient's history were reviewed and updated as appropriate: Allergies, current medications, and problem list.    Review of Systems:  Patient denies headache, fevers, malaise, unintentional weight loss, skin rash, eye pain, sinus congestion and sinus pain, sore throat, dysphagia,  hemoptysis , cough, dyspnea, wheezing, chest pain, palpitations, orthopnea, edema, abdominal pain, nausea, melena, diarrhea, constipation, flank pain, dysuria, hematuria, urinary  Frequency, nocturia, numbness, tingling, seizures,  Focal weakness, Loss of  consciousness,  Tremor, insomnia, depression, anxiety, and suicidal ideation.     History   Social History  . Marital Status: Married    Spouse Name: N/A    Number of Children: N/A  . 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 Smoking status: Former Games developer  . Smokeless tobacco: Never Used  . Alcohol Use: No  . Drug Use: No  . Sexually Active: Not on file   Other Topics Concern  . Not on file   Social History Narrative  . No narrative on file    Objective:  BP 144/84  Pulse 78  Temp(Src) 97.7 F (36.5 C) (Oral)  Resp 16  Wt 178 lb 8 oz (80.967 kg)  BMI 25.99 kg/m2  SpO2 98%  General appearance: alert, cooperative and appears stated age Ears: normal TM's and external ear canals both ears Throat: lips, mucosa, and tongue normal; teeth and gums normal Neck: no adenopathy, no carotid bruit, supple, symmetrical, trachea midline and thyroid not enlarged, symmetric, no tenderness/mass/nodules Back: symmetric, no curvature. ROM normal. No CVA tenderness. Lungs: clear to auscultation bilaterally Heart: regular rate and rhythm, S1, S2 normal, no murmur, click, rub or gallop Abdomen: soft, non-tender; bowel sounds normal; no masses,  no organomegaly Pulses: 2+ and symmetric Skin: Skin color, texture, turgor normal. No rashes or lesions Lymph nodes: Cervical, supraclavicular, and axillary nodes normal. Foot exam:  Nails are well  trimmed,  No callouses,  Sensation intact to microfilament in 7 of 12 locations.   Assessment and Plan:  Hyperlipidemia Dr Juliann Pares wants to resume the lipitor  As soon as possible.   Type II or unspecified type diabetes mellitus with renal manifestations, not stated as uncontrolled(250.40) Home cbgs have been 120 to 150 fasting and post prandially.  Well-controlled on current medications.  hemoglobin A1c has been consistently less than 7.0 . He is up-to-date on eye exams and his foot exam is  normal. l we'll repeat his urine microalbumin to creatinine ratio at next visit. He is on the appropriate medications.  Thyroid cancer Diagnosed concurrently with lung Ca, now treated with thyroidectomy,  nuc med studies and follow up with Dr Tedd Sias for titration of Synthroid dose.    Updated Medication List Outpatient Encounter Prescriptions as of 10/27/2012  Medication Sig Dispense Refill  . aspirin 81 MG EC tablet Take 1 tablet (81 mg total) by mouth daily. Swallow whole.  30 tablet  12  . Calcium Acetate 667 MG TABS Take 2 capsules by mouth 2 (two) times daily.      . enalapril (VASOTEC) 10 MG tablet Take 1 tablet (10 mg total) by mouth daily.  90 tablet  1  . levothyroxine (SYNTHROID, LEVOTHROID) 125 MCG tablet Take 125 mcg by mouth daily before breakfast.      . tamsulosin (FLOMAX) 0.4 MG CAPS TAKE ONE CAPSULE EVERY DAY  90 capsule  0  . [DISCONTINUED] Tamsulosin HCl (FLOMAX) 0.4 MG CAPS TAKE ONE CAPSULE EVERY DAY  90 capsule  0  . azithromycin (ZITHROMAX) 500 MG tablet Take 1 tablet (500 mg total) by mouth daily.  7 tablet  0  . BAYER CONTOUR TEST test strip USE AS DIRECTED  100 each  11   No facility-administered encounter medications on file as of 10/27/2012.     No orders of the defined types were placed in this encounter.    No Follow-up on file.

## 2012-10-28 ENCOUNTER — Encounter: Payer: Self-pay | Admitting: Internal Medicine

## 2012-10-28 DIAGNOSIS — Z85850 Personal history of malignant neoplasm of thyroid: Secondary | ICD-10-CM | POA: Insufficient documentation

## 2012-10-28 NOTE — Assessment & Plan Note (Signed)
Home cbgs have been 120 to 150 fasting and post prandially.  Well-controlled on current medications.  hemoglobin A1c has been consistently less than 7.0 . He is up-to-date on eye exams and his foot exam is normal. l we'll repeat his urine microalbumin to creatinine ratio at next visit. He is on the appropriate medications.

## 2012-10-28 NOTE — Assessment & Plan Note (Signed)
Diagnosed concurrently with lung Ca, now treated with thyroidectomy,  nuc med studies and follow up with Dr Tedd Sias for titration of Synthroid dose.

## 2012-10-29 ENCOUNTER — Ambulatory Visit: Payer: BC Managed Care – PPO | Admitting: Internal Medicine

## 2012-11-18 ENCOUNTER — Ambulatory Visit: Payer: Self-pay

## 2012-11-21 IMAGING — NM NUCLEAR MEDICINE 24 HOUR THYROID UPTAKE
4 series · 12 of 12 positions shown · non-contrast
Comparison: none

REASON FOR EXAM: Thyroid cancer
COMMENTS:

[Series 1000: 48 hr statics · 2.40mm/px · 2 acquisitions, 4 frames shown]
[im 1/2  full-range]
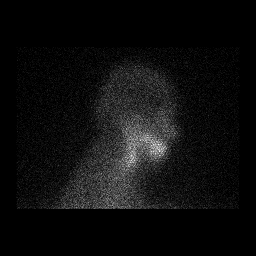
[im 1/2  full-range]
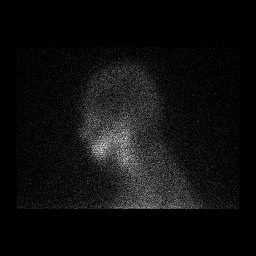
[im 2/2]
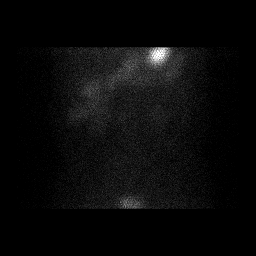
[im 2/2]
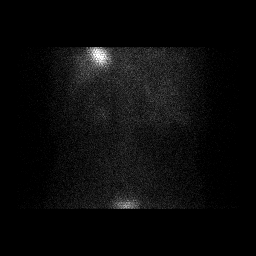

[Series 1000: (id) statics 24 hour · 2.40mm/px · 2 acquisitions, 4 frames shown]
[im 1/2  full-range]
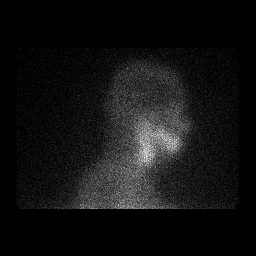
[im 1/2  full-range]
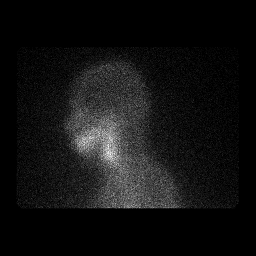
[im 2/2]
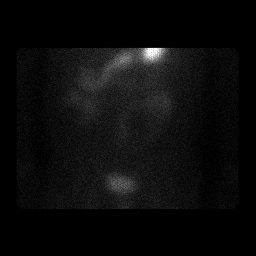
[im 2/2]
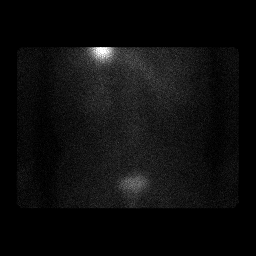

[Series 1000: (id) wb 24 hr · 2.40mm/px · 2 of 2 frames shown]
[frame 1/2]
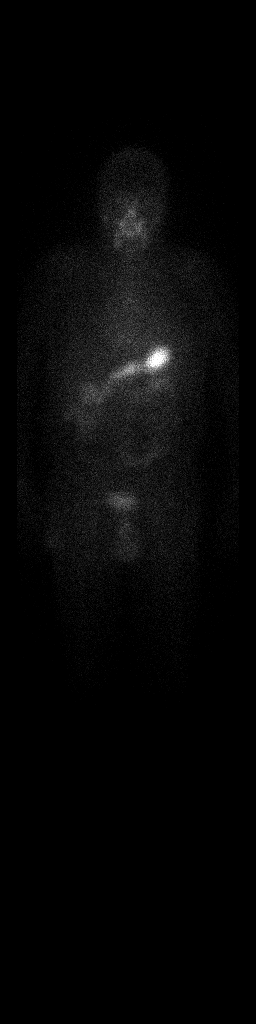
[frame 2/2]
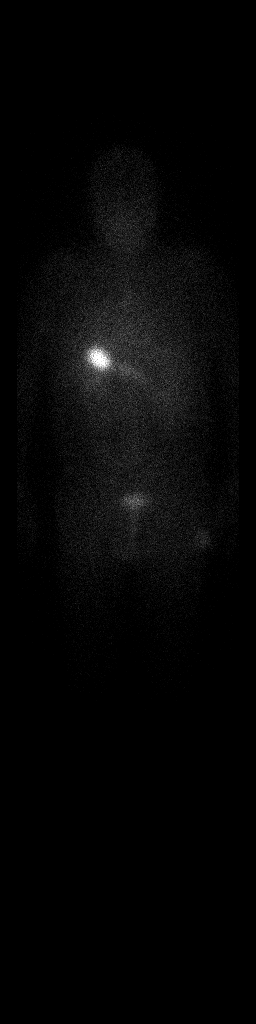

[Series 1000: 48hr wholebody · 2.40mm/px · 2 of 2 frames shown]
[frame 1/2  full-range]
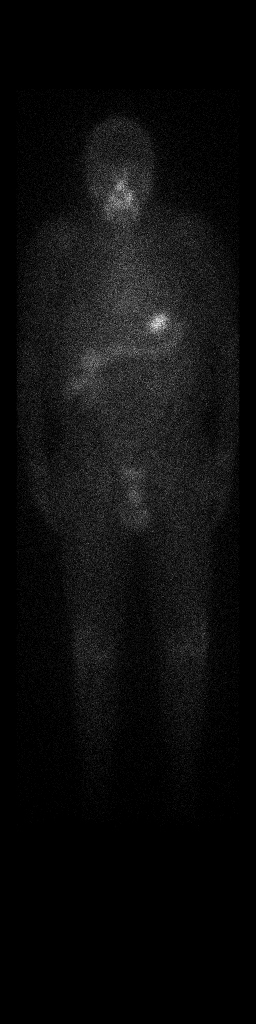
[frame 2/2  full-range]
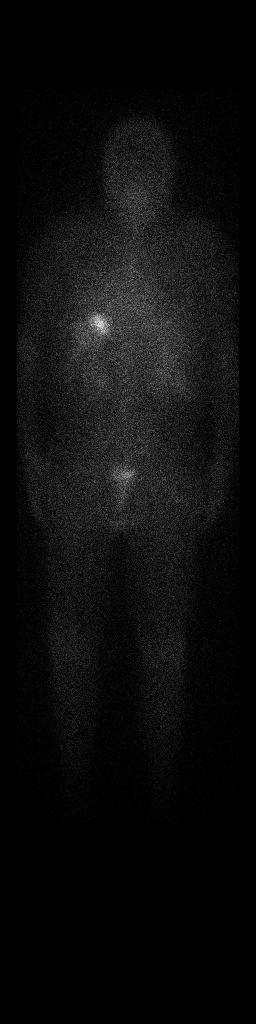

[12 of 12 positions shown; findings below may reference images not displayed]

PROCEDURE:     NM  - NM THYROID WB 48HR  - [DATE]  [DATE]

RESULT:     The patient is a history of total thyroidectomy with some
attached lymph nodes in the surgical specimen showing papillary thyroid
carcinoma follicular variant in the left lobe measuring 2.0 cm with a 0.2 cm
papillary thyroid carcinoma conventional in the right lobe. Lymph nodes were
negative for malignant disease. There was tumor capsule present with focal
capsular invasion and no lymphatic or extrathyroidal extension. Vascular
invasion was not demonstrated. The patient was given a dose of 4.32 mCi of
[PP] with Thyrogen supplementation. Images obtained over the whole body
from the cranial vertex into the thighs at 24 and 48 hours showed no
significant activity over the neck area there is some GI and GU localization
as well as mucosal localization in the sinuses. Regional images show no
additional abnormality.
IMPRESSION: No significant residual activity of the thyroid region. The
patient is scheduled for post thyroidectomy and ablation. There is no recent
TSH level showing elevated TSH which is necessary for post surgical ablation.

[REDACTED]

## 2012-11-25 ENCOUNTER — Other Ambulatory Visit: Payer: Self-pay

## 2012-12-08 ENCOUNTER — Other Ambulatory Visit: Payer: Self-pay

## 2012-12-08 LAB — TSH: Thyroid Stimulating Horm: 0.255 u[IU]/mL — ABNORMAL LOW

## 2013-01-05 ENCOUNTER — Ambulatory Visit: Payer: Self-pay | Admitting: Internal Medicine

## 2013-01-05 ENCOUNTER — Ambulatory Visit: Payer: Self-pay | Admitting: Oncology

## 2013-01-05 LAB — CBC CANCER CENTER
Basophil #: 0 x10 3/mm (ref 0.0–0.1)
Basophil %: 0.4 %
Eosinophil %: 2.5 %
HCT: 41.6 % (ref 40.0–52.0)
Lymphocyte #: 1.5 x10 3/mm (ref 1.0–3.6)
Lymphocyte %: 19.5 %
MCHC: 34.6 g/dL (ref 32.0–36.0)
Monocyte #: 0.5 x10 3/mm (ref 0.2–1.0)
Monocyte %: 6.5 %
Neutrophil #: 5.6 x10 3/mm (ref 1.4–6.5)
Neutrophil %: 71.1 %
Platelet: 146 x10 3/mm — ABNORMAL LOW (ref 150–440)
RBC: 4.7 10*6/uL (ref 4.40–5.90)
RDW: 15.4 % — ABNORMAL HIGH (ref 11.5–14.5)

## 2013-01-05 LAB — BASIC METABOLIC PANEL
Anion Gap: 8 (ref 7–16)
Chloride: 106 mmol/L (ref 98–107)
Co2: 28 mmol/L (ref 21–32)
EGFR (African American): 38 — ABNORMAL LOW
EGFR (Non-African Amer.): 33 — ABNORMAL LOW
Osmolality: 294 (ref 275–301)
Potassium: 4.8 mmol/L (ref 3.5–5.1)

## 2013-01-05 IMAGING — CT CT CHEST W/O CM
1 of 2 series · 14 of 32 positions shown, 18 images · non-contrast
Comparison: none

REASON FOR EXAM: restaging lung CA
COMMENTS:

[Series 2: chest w/o 3.0 i31f 2 · axial · non-contrast · 0.79mm/px · z∈[-646,-361]mm · 14 of 113 slices shown, 18 images]
[im 9/113  mediastinal]
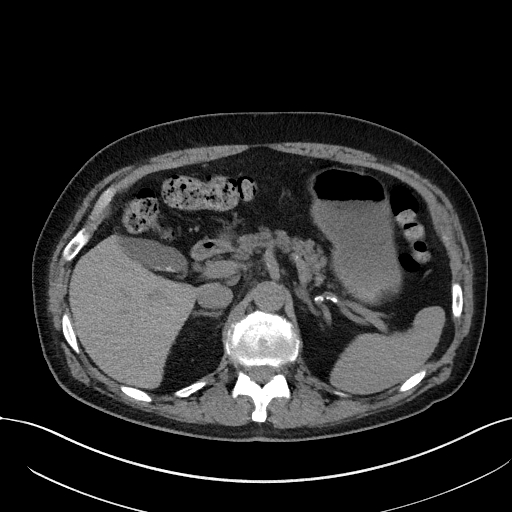
[im 9/113  lung]
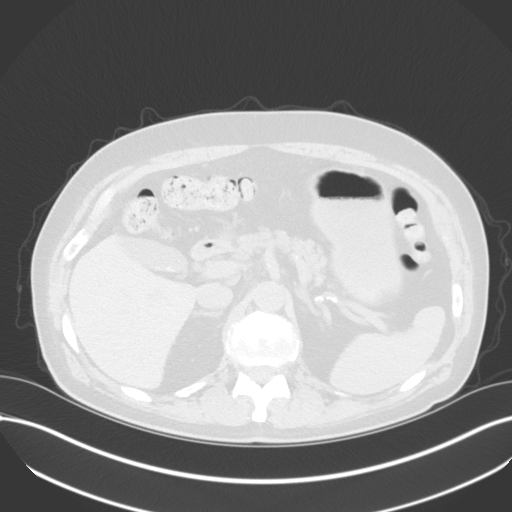
[im 18/113  lung]
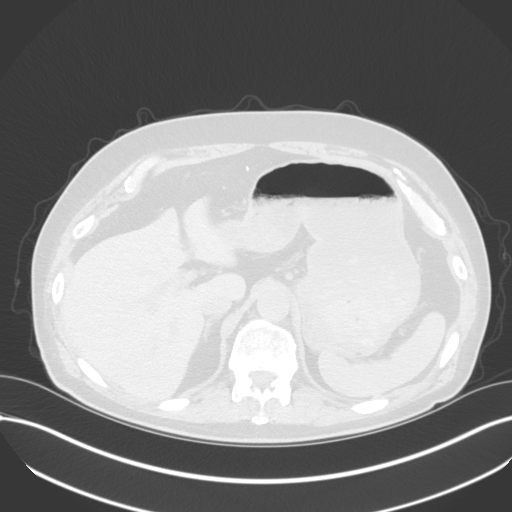
[im 26/113  lung]
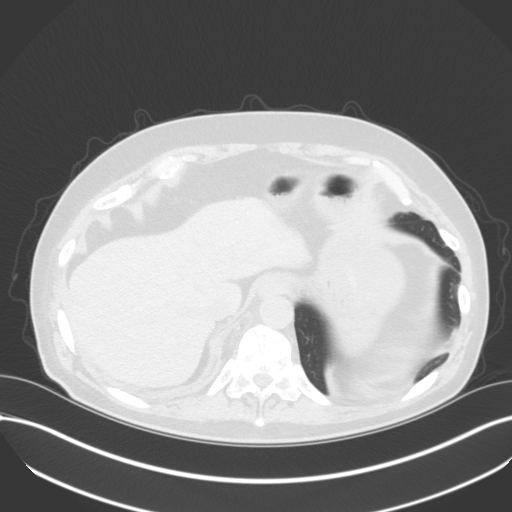
[im 35/113  lung]
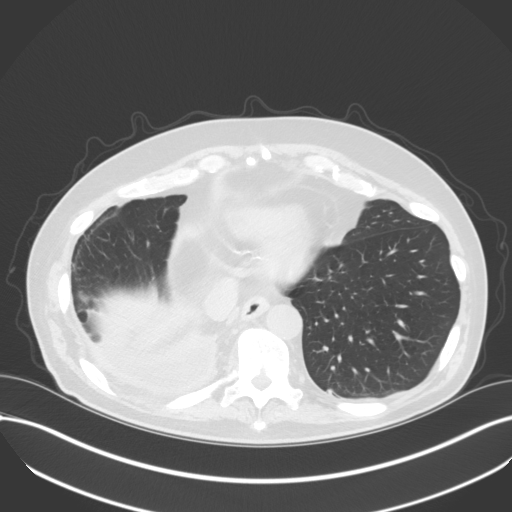
[im 44/113  mediastinal]
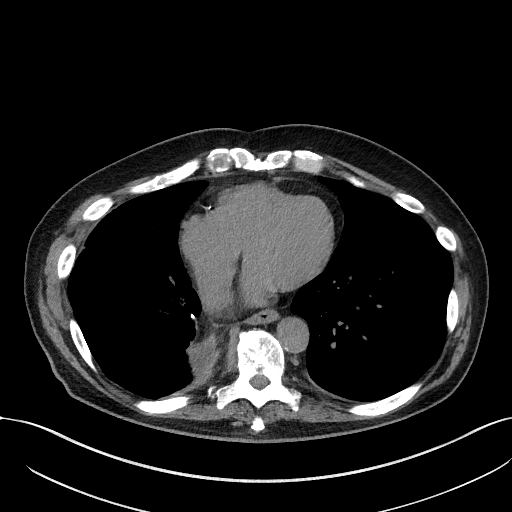
[im 44/113  lung]
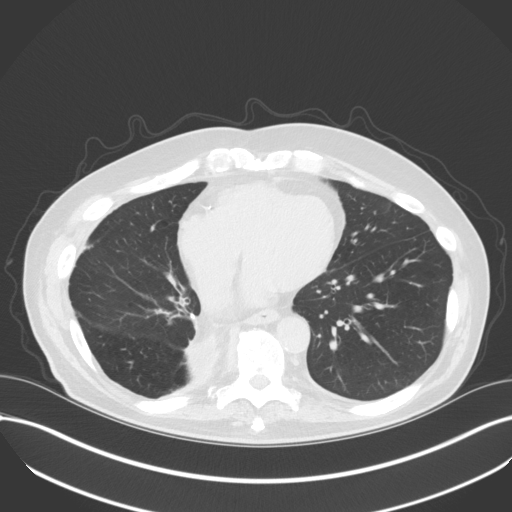
[im 52/113  lung]
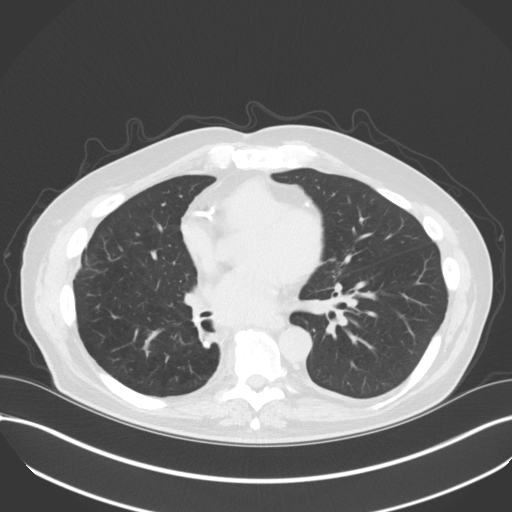
[im 54/113  lung]
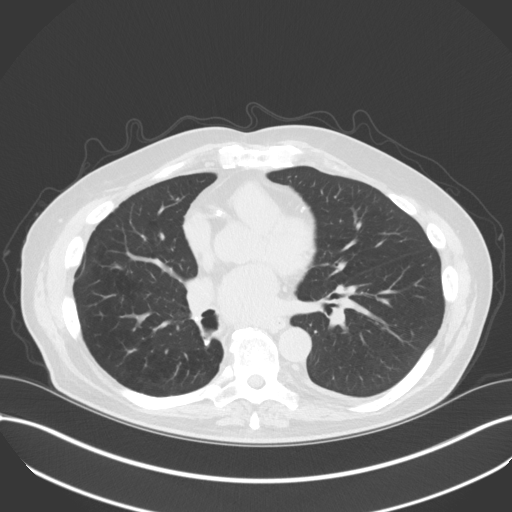
[im 57/113  lung]
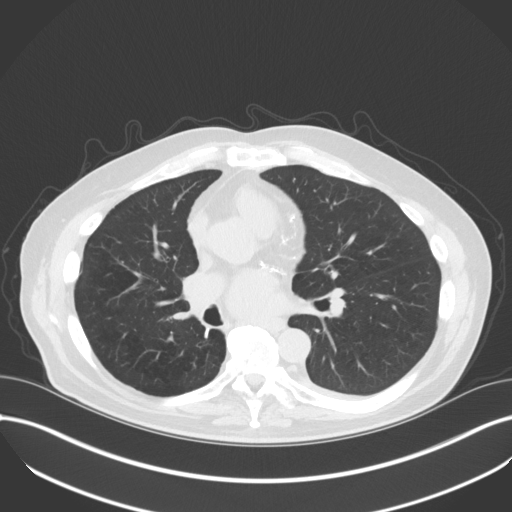
[im 61/113  mediastinal]
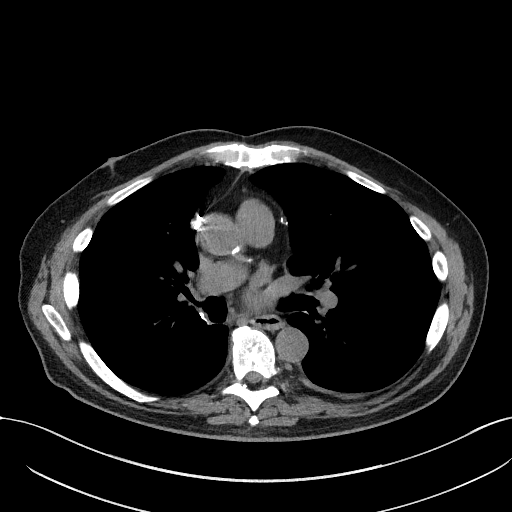
[im 61/113  lung]
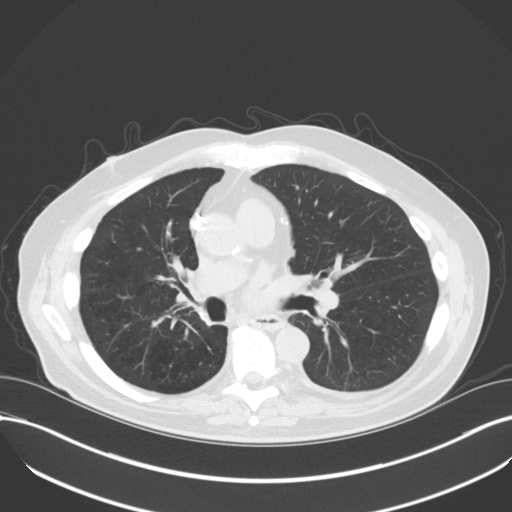
[im 69/113  lung]
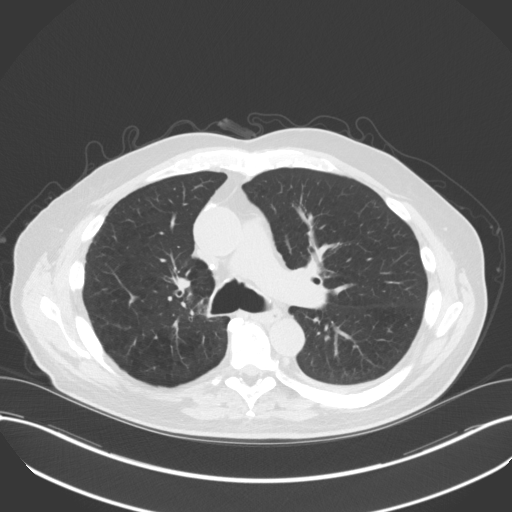
[im 78/113  lung]
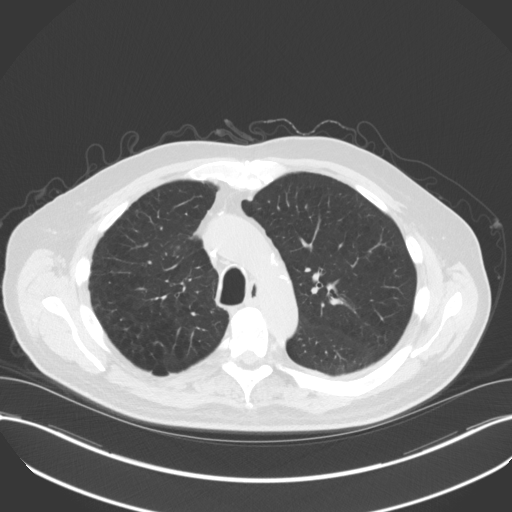
[im 87/113  lung]
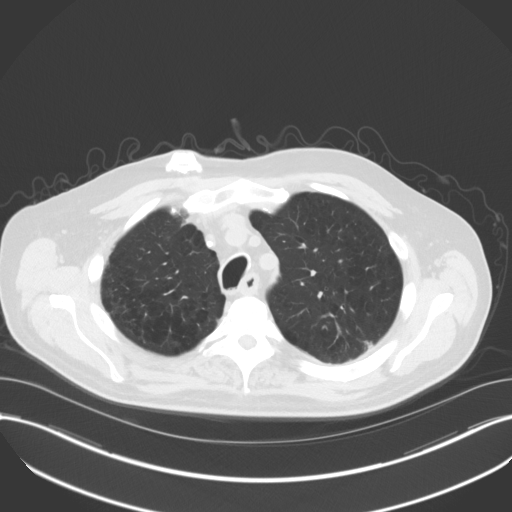
[im 95/113  mediastinal]
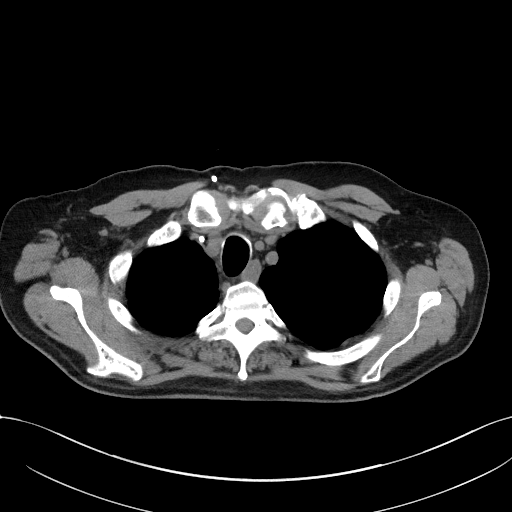
[im 95/113  lung]
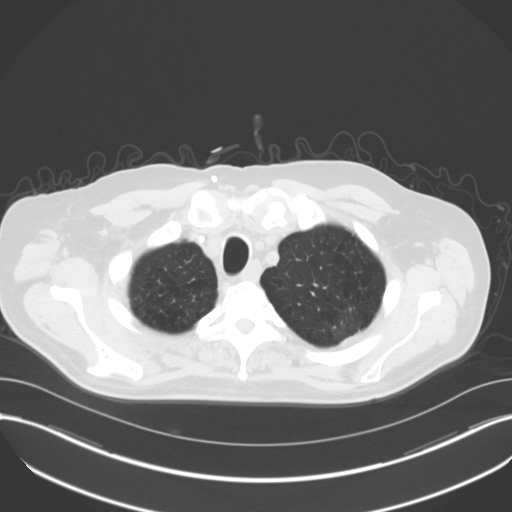
[im 104/113  lung]
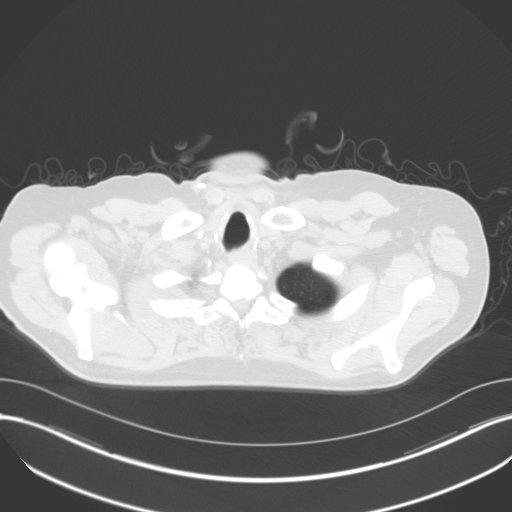

[14 of 32 positions shown; findings below may reference images not displayed]

PROCEDURE:     KCT - KCT CHEST WITHOUT CONTRAST  - [DATE] [DATE]

RESULT:     Axial noncontrast CT scanning was performed through the chest
with reconstructions at 3 mm intervals and slice thickness. Review of
multiplanar reconstructed images was performed separately on the VIA
monitor. Comparison made to studies [DATE] and [DATE].

There is stable soft tissue fullness in the right posterior costophrenic
gutter extending medially along the paravertebral region. There is a small
amount of pleural thickening on the left posteriorly with punctate
calcifications. There are no alveolar infiltrates. There is minimal
emphysematous change in both lungs. A small amount of pleural-based density
posteriorly in the left upper lobe remains.

The cardiac chambers are not enlarged. There are coronary artery
calcifications. The caliber of the thoracic aorta is normal. No pathologic
sized mediastinal or hilar lymph nodes are demonstrated. There is a small
hiatal hernia.

Within the upper abdomen the observed portions of the liver and spleen are
normal. Layering calcified gallstones are demonstrated. There is a stable
mass in the body of the left adrenal gland on image 107 which measures 1.2 x
1.8 cm.
IMPRESSION: 1. There is a stable appearance of both lungs and of the pleural-based
densities in the right lower hemithorax and in the posterior aspect of the
left upper hemithorax.
2. I do not see new masses or other areas of acute parenchymal abnormality.
3. There is no bulky mediastinal or hilar lymphadenopathy. There are
coronary artery calcifications present.
4. There is a stable left adrenal mass. Stable appearing gallstones are
demonstrated.

[REDACTED]

## 2013-01-13 ENCOUNTER — Inpatient Hospital Stay: Payer: Self-pay | Admitting: Endocrinology

## 2013-01-23 ENCOUNTER — Ambulatory Visit: Payer: Self-pay | Admitting: Internal Medicine

## 2013-01-27 ENCOUNTER — Ambulatory Visit: Payer: BC Managed Care – PPO | Admitting: Internal Medicine

## 2013-02-02 ENCOUNTER — Other Ambulatory Visit: Payer: Self-pay | Admitting: Internal Medicine

## 2013-02-28 ENCOUNTER — Other Ambulatory Visit: Payer: Self-pay | Admitting: Internal Medicine

## 2013-03-05 ENCOUNTER — Ambulatory Visit (INDEPENDENT_AMBULATORY_CARE_PROVIDER_SITE_OTHER): Payer: 59 | Admitting: Internal Medicine

## 2013-03-05 ENCOUNTER — Encounter: Payer: Self-pay | Admitting: Internal Medicine

## 2013-03-05 VITALS — BP 118/78 | HR 72 | Temp 97.5°F | Resp 14 | Ht 69.5 in | Wt 171.0 lb

## 2013-03-05 DIAGNOSIS — E785 Hyperlipidemia, unspecified: Secondary | ICD-10-CM

## 2013-03-05 DIAGNOSIS — E890 Postprocedural hypothyroidism: Secondary | ICD-10-CM

## 2013-03-05 DIAGNOSIS — I10 Essential (primary) hypertension: Secondary | ICD-10-CM

## 2013-03-05 DIAGNOSIS — E1129 Type 2 diabetes mellitus with other diabetic kidney complication: Secondary | ICD-10-CM

## 2013-03-05 NOTE — Progress Notes (Signed)
[name], male   DOB: June 18, 1946, 67 y.o.   MRN: [id number]   Patient Active Problem List   Diagnosis Date Noted  . 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 Thyroid cancer   . Cancer   . CAD (coronary artery disease), autologous vein bypass graft 07/12/2011  . Personal history of pneumonia 07/12/2011  . Type II or unspecified type diabetes mellitus with renal manifestations, not stated as uncontrolled(250.40) 07/12/2011  . Hyperlipidemia   . Hypertension   . Polyp of colon, adenomatous 04/08/2011    Subjective:  CC:   Chief Complaint  Patient presents with  . Follow-up    3 month follow up    HPI:   Nathaniel Lane a 67 y.o. male who presents 3 month follow up on DM, hyperlipidemia, thyroid cancer s/p thyroidectomy March 2014 squamous cell lung cancer. His weight has been stable  despite the loss suggested by the vital signs posted.  He weighs himself weekly at home and there has been no recent change.  He has been checking his blood sugars regularly with no fastings < 80 or > 130.  He is controlling his DM with low glycemic index diet. He has noted some foot numbness and nocturnal foot cramps, but is sleeping well and  Has a good energy level.     Has appt with dr Tedd Sias today to follow up on iatrogenic hypothyroidism and had labs done last week , but is not sure if a1c or lipids were done.     Past Medical History  Diagnosis Date  . Diabetes mellitus   . Hyperlipidemia   . Hypertension   . Myocardial infarction   . Cancer 2012    Stage IIA squamous cell Lung Ca  . Thyroid cancer 2013    s/p thyroidectomy 2014    Past Surgical History  Procedure Laterality Date  . Coronary artery bypass graft  2007  . Lung lobectomy         The following portions of the patient's history were reviewed and updated as appropriate: Allergies, current medications, and problem list.    Review of Systems:   12 Pt  review of systems was negative except those  addressed in the HPI,     History   Social History  . Marital Status: Married    Spouse Name: N/A    Number of Children: N/A  . 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 Smoking status: Former Games developer  . Smokeless tobacco: Never Used  . Alcohol Use: No  . Drug Use: No  . Sexual Activity: Not on file   Other Topics Concern  . Not on file   Social History Narrative  . No narrative on file    Objective:  Filed Vitals:   03/05/13 0927  BP: 118/78  Pulse: 72  Temp: 97.5 F (36.4 C)  Resp: 14     General appearance: alert, cooperative and appears stated age Ears: normal TM's and external ear canals both ears Throat: lips, mucosa, and tongue normal; teeth and gums normal Neck: no adenopathy, no carotid bruit, supple, symmetrical, trachea midline and thyroid not enlarged, symmetric, no tenderness/mass/nodules Back: symmetric, no curvature. ROM normal. No CVA tenderness. Lungs: clear to auscultation bilaterally Heart: regular rate and rhythm, S1, S2 normal, no murmur, click, rub or gallop Abdomen: soft, non-tender; bowel sounds normal; no masses,  no organomegaly Pulses: 2+ and symmetric Skin: Skin color,  texture, turgor normal. No rashes or lesions Lymph nodes: Cervical, supraclavicular, and axillary nodes normal.  Assessment and Plan:  Postsurgical hypothyroidism Managed with Dr Tedd Sias  Hypertension Well controlled on enalapril.  Renal function stable, no changes today.  Type II or unspecified type diabetes mellitus with renal manifestations, not stated as uncontrolled(250.40) Well-controlled on diet alone by last A1c in February of 6.7 . He is up-to-date on eye exams and his foot exam is done with each visit due to chemotherapy induced neuropathy. It is unclear whether Dr Tedd Sias has ordered lipids , CMET and a1c. With her follow up on his thyroid cancer.  He is on an ACE Inhibitor but no statin    Updated  Medication List Outpatient Encounter Prescriptions as of 03/05/2013  Medication Sig Dispense Refill  . aspirin 81 MG EC tablet Take 1 tablet (81 mg total) by mouth daily. Swallow whole.  30 tablet  12  . BAYER CONTOUR TEST test strip USE AS DIRECTED  100 each  11  . enalapril (VASOTEC) 10 MG tablet TAKE 1 TABLET BY MOUTH EVERY DAY  90 tablet  1  . levothyroxine (SYNTHROID, LEVOTHROID) 125 MCG tablet Take 125 mcg by mouth daily before breakfast.      . tamsulosin (FLOMAX) 0.4 MG CAPS capsule TAKE ONE CAPSULE BY MOUTH EVERY DAY  90 capsule  1  . azithromycin (ZITHROMAX) 500 MG tablet Take 1 tablet (500 mg total) by mouth daily.  7 tablet  0  . Calcium Acetate 667 MG TABS Take 2 capsules by mouth 2 (two) times daily.       No facility-administered encounter medications on file as of 03/05/2013.     Orders Placed This Encounter  Procedures  . Basic metabolic panel  . Lipid panel  . Hepatic function panel  . Hemoglobin A1c    No Follow-up on file.

## 2013-03-07 ENCOUNTER — Encounter: Payer: Self-pay | Admitting: Internal Medicine

## 2013-03-07 DIAGNOSIS — E890 Postprocedural hypothyroidism: Secondary | ICD-10-CM | POA: Insufficient documentation

## 2013-03-07 NOTE — Assessment & Plan Note (Addendum)
Well-controlled on diet alone by last A1c in February of 6.7 . He is up-to-date on eye exams and his foot exam is done with each visit due to chemotherapy induced neuropathy. It is unclear whether Dr Tedd Sias has ordered lipids , CMET and a1c. With her follow up on his thyroid cancer.  He is on an ACE Inhibitor but no statin

## 2013-03-07 NOTE — Assessment & Plan Note (Signed)
Managed with Dr Tedd Sias

## 2013-03-07 NOTE — Assessment & Plan Note (Signed)
Well controlled on enalapril.  Renal function stable, no changes today.

## 2013-03-11 ENCOUNTER — Telehealth: Payer: Self-pay | Admitting: Internal Medicine

## 2013-03-11 DIAGNOSIS — E1029 Type 1 diabetes mellitus with other diabetic kidney complication: Secondary | ICD-10-CM

## 2013-03-11 NOTE — Telephone Encounter (Signed)
i received the office note from dr Tedd Sias.  She did not check a1c or lipids, so I would like him to return for a fasting lab visit whenever he has a chance.

## 2013-03-11 NOTE — Telephone Encounter (Signed)
Left message with wife regarding scheduling fasting lab appointment. Will call back to schedule.

## 2013-03-13 ENCOUNTER — Other Ambulatory Visit (INDEPENDENT_AMBULATORY_CARE_PROVIDER_SITE_OTHER): Payer: 59

## 2013-03-13 DIAGNOSIS — E1029 Type 1 diabetes mellitus with other diabetic kidney complication: Secondary | ICD-10-CM

## 2013-03-13 LAB — LIPID PANEL
HDL: 31.4 mg/dL — ABNORMAL LOW (ref >39.00)
Total CHOL/HDL Ratio: 8
VLDL: 29.4 mg/dL (ref 0.0–40.0)

## 2013-03-13 LAB — COMPREHENSIVE METABOLIC PANEL
ALT: 17 U/L (ref 0–53)
AST: 17 U/L (ref 0–37)
Alkaline Phosphatase: 108 U/L (ref 39–117)
Sodium: 136 mEq/L (ref 135–145)
Total Bilirubin: 0.5 mg/dL (ref 0.3–1.2)
Total Protein: 7.3 g/dL (ref 6.0–8.3)

## 2013-03-13 LAB — LDL CHOLESTEROL, DIRECT: Direct LDL: 178.5 mg/dL

## 2013-03-13 LAB — HEMOGLOBIN A1C: Hgb A1c MFr Bld: 6.1 % (ref 4.6–6.5)

## 2013-03-16 ENCOUNTER — Encounter: Payer: Self-pay | Admitting: *Deleted

## 2013-04-13 ENCOUNTER — Telehealth: Payer: Self-pay | Admitting: Internal Medicine

## 2013-04-13 NOTE — Telephone Encounter (Signed)
Ok to see Wednesday.  You cannot  X ray sinuses ,  You need a CT scan.  Have him use simply saline to flush sinuses twice daily,  And take sudafed PE 10 mg every 6 hours for the congestion

## 2013-04-13 NOTE — Telephone Encounter (Signed)
Patient refuses to go to ER and no appointment available with you until 02/13/13 patient stated he had a clear CT reported to him by Oncologist a couple of weeks ago after conferring with Oncologist whom referred patient to see his regular MD. Patient only wants to see MD.   Please advise.

## 2013-04-13 NOTE — Telephone Encounter (Signed)
States he has a cough and saw blood last night.  States he would like to have x-rays.  Was seen by lung doctor recently who said everything was fine.  States he has had some sinus issues lately.  Asking for x-rays at hospital. Would rather not have appt.  Does not want to go to ER.  Please advise.

## 2013-04-14 NOTE — Telephone Encounter (Signed)
Left detailed message with wife donna.

## 2013-04-15 ENCOUNTER — Ambulatory Visit (INDEPENDENT_AMBULATORY_CARE_PROVIDER_SITE_OTHER): Payer: 59 | Admitting: Internal Medicine

## 2013-04-15 ENCOUNTER — Encounter: Payer: Self-pay | Admitting: Internal Medicine

## 2013-04-15 ENCOUNTER — Ambulatory Visit: Payer: Self-pay | Admitting: Internal Medicine

## 2013-04-15 VITALS — BP 118/68 | HR 88 | Temp 98.6°F | Resp 12 | Wt 173.5 lb

## 2013-04-15 DIAGNOSIS — R042 Hemoptysis: Secondary | ICD-10-CM

## 2013-04-15 DIAGNOSIS — C801 Malignant (primary) neoplasm, unspecified: Secondary | ICD-10-CM

## 2013-04-15 IMAGING — CR DG CHEST 2V
1 series · 3 of 3 positions shown · non-contrast
Comparison: none

REASON FOR EXAM: cough, hemoptysis hx of lung and thyroid ca
COMMENTS:

[Series 1: pa · 0.17mm/px · 3 of 3 slices shown]
[im 1/3]
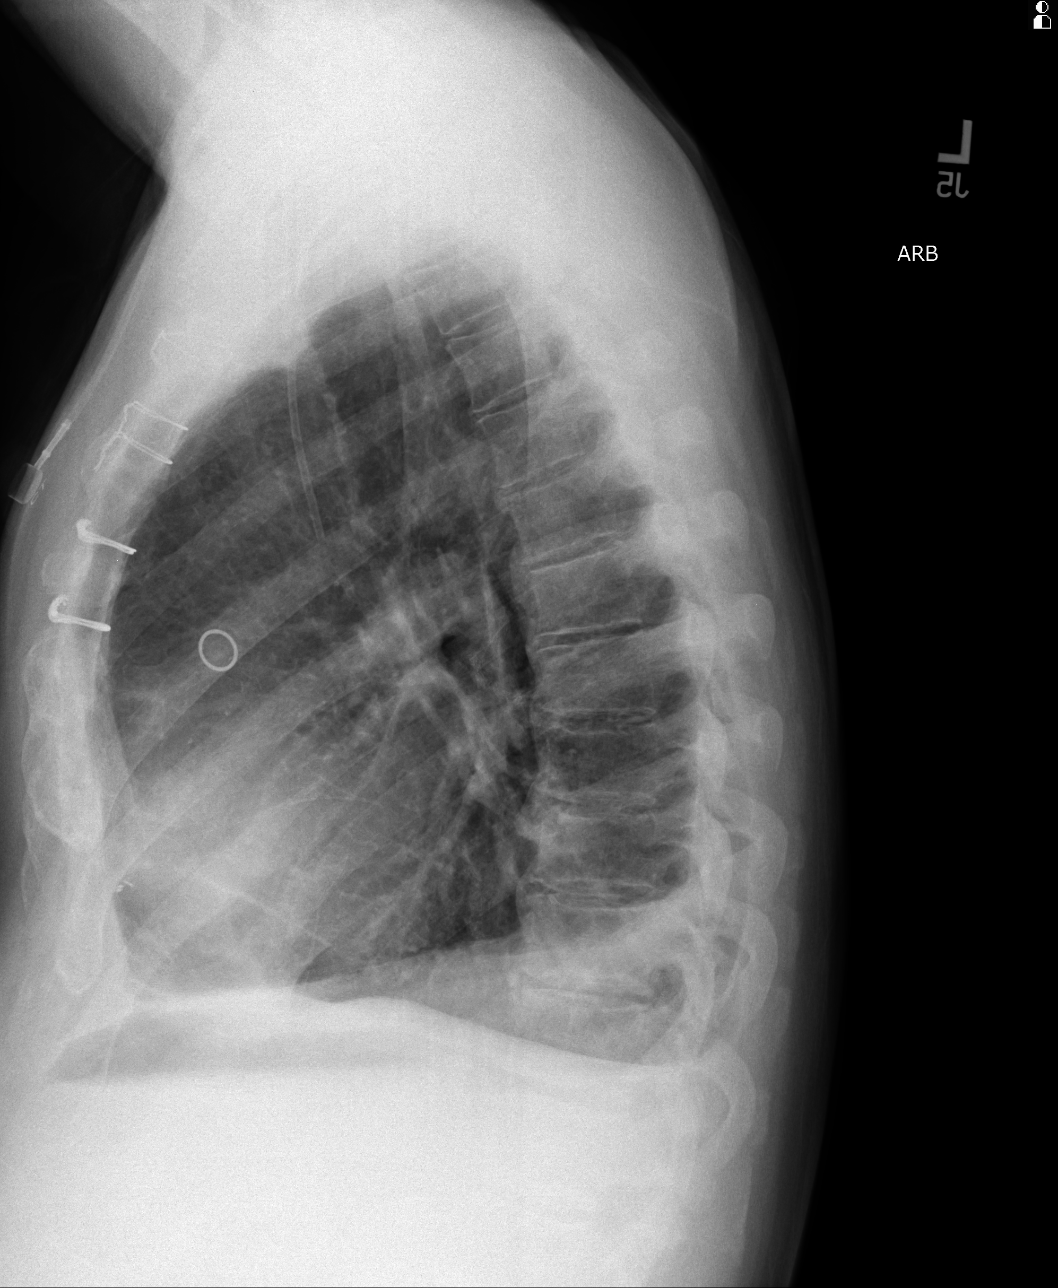
[im 2/3]
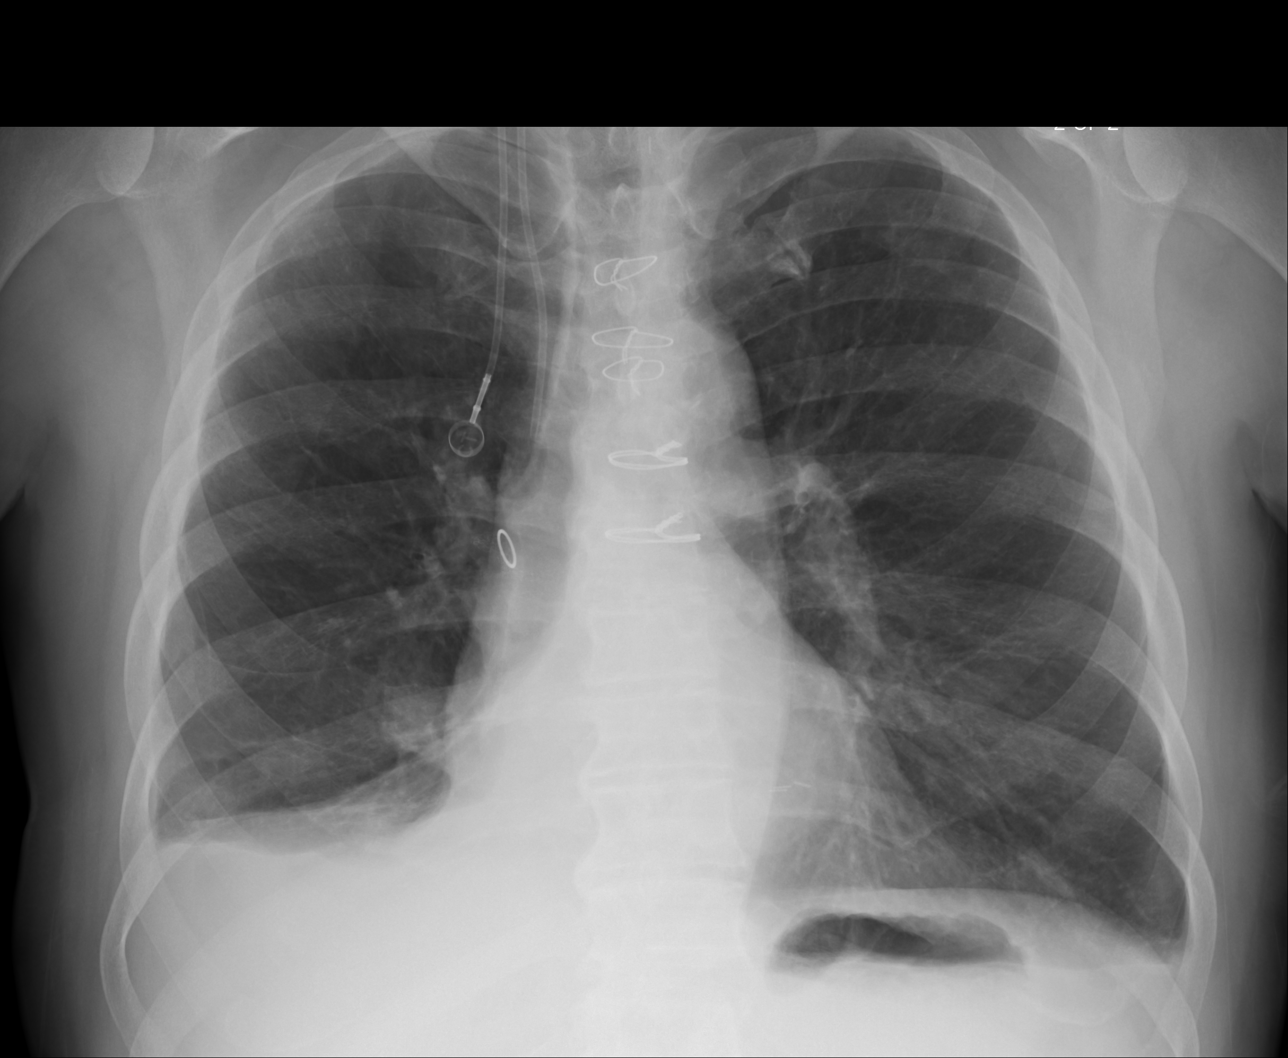
[im 3/3]
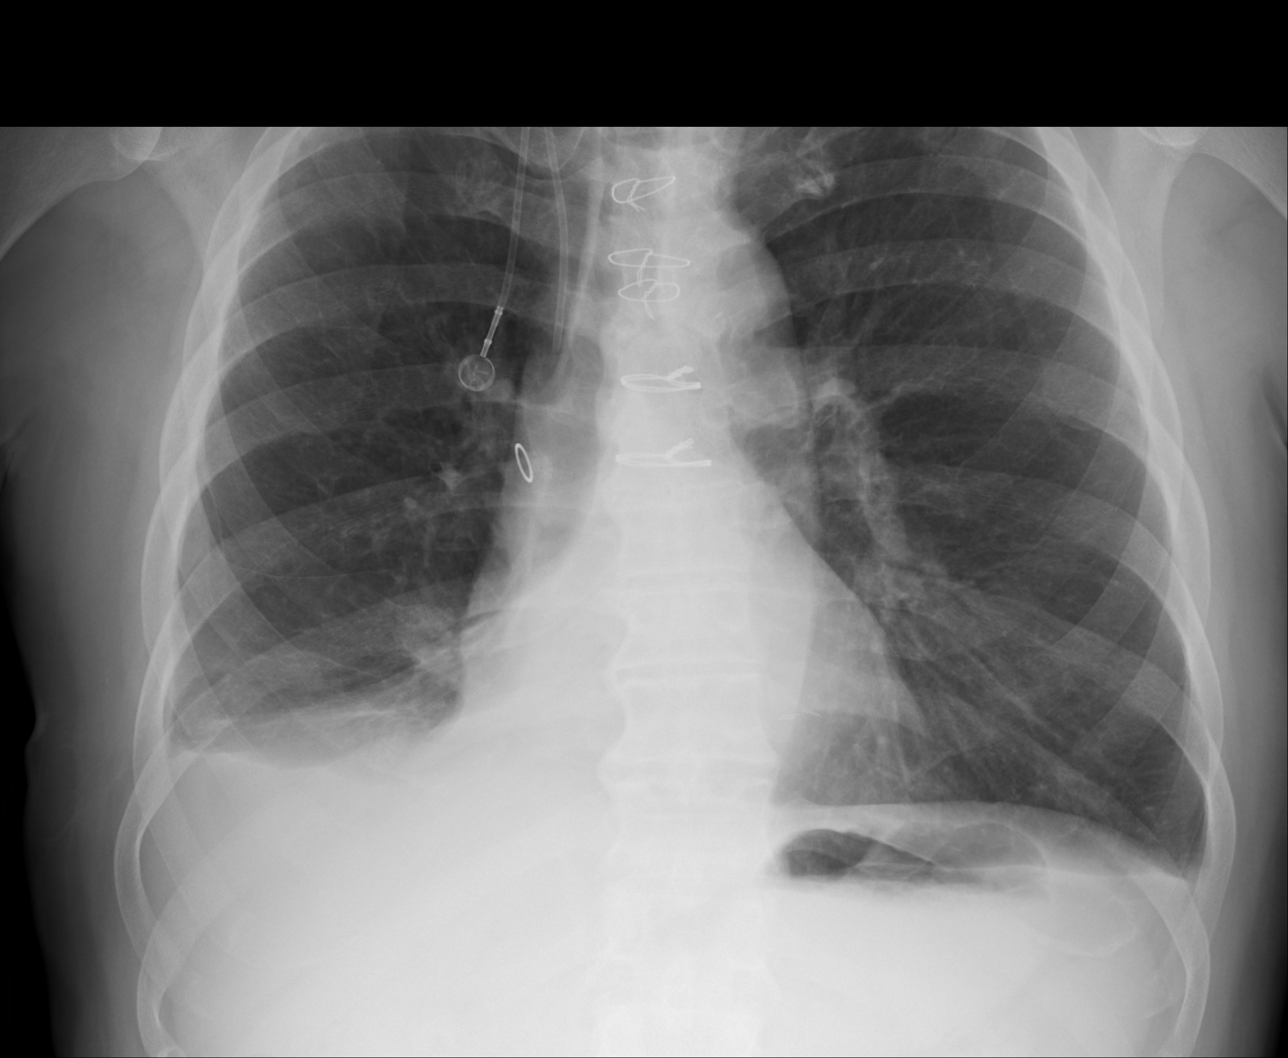

[3 of 3 positions shown; findings below may reference images not displayed]

PROCEDURE:     DXR - DXR CHEST PA (OR AP) AND LATERAL  - [DATE]  [DATE]

RESULT:     Comparison is made to the study [DATE].

The lungs are mildly hyperinflated. There is blunting of the costophrenic
angles bilaterally but most prominently on the right. This is not new but is
less conspicuous than in the past. No meniscus is demonstrated. There is no
pneumothorax. Cardiac silhouette is normal in size. The pulmonary
vascularity is not engorged. The patient has undergone previous CABG. A
Port-A-Cath appliance is in place. The observed portions of the bony thorax
exhibit no acute abnormalities.
IMPRESSION: 1. There is hyperinflation consistent with COPD. Small amounts of pleural
fluid versus pleural thickening are present at the lung bases.
2. There is no evidence of CHF nor of pneumonia.

If the patient's symptoms persist and remain unexplained, followup chest CT
scanning would be useful.

[REDACTED]

## 2013-04-15 MED ORDER — LEVOFLOXACIN 500 MG PO TABS: 500.0000 mg | ORAL_TABLET | Freq: Every day | ORAL | Status: DC

## 2013-04-15 MED ORDER — HYDROCOD POLST-CHLORPHEN POLST 10-8 MG/5ML PO LQCR: 5.0000 mL | Freq: Two times a day (BID) | ORAL | Status: DC | PRN

## 2013-04-15 MED ORDER — CULTURELLE DIGESTIVE HEALTH PO CAPS: 1.0000 | ORAL_CAPSULE | Freq: Every day | ORAL | Status: DC

## 2013-04-15 NOTE — Assessment & Plan Note (Addendum)
Sounds congested but has no facial pain or sinus tenderness. Treated for sinusitis with  Levaquin,   sinus lavage and tussionex.  PA and lateral CXR was negative for pneumonia and new masses.

## 2013-04-15 NOTE — Progress Notes (Signed)
[name], male   DOB: [birth year], 67 y.o.   MRN: [id number]   Patient Active Problem List   Diagnosis Date Noted  . Hemoptysis 04/15/2013  . 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 Thyroid cancer   . Cancer   . CAD (coronary artery disease), autologous vein bypass graft 07/12/2011  . Personal history of pneumonia 07/12/2011  . Type II or unspecified type diabetes mellitus with renal manifestations, not stated as uncontrolled(250.40) 07/12/2011  . Hyperlipidemia   . Hypertension   . Polyp of colon, adenomatous 04/08/2011    Subjective:  CC:   Chief Complaint  Patient presents with  . Cough     X 1 day blood in sputum, last chest ct reported normal to patient by oncology.    HPI:   LENDON Lane a 67 y.o. male who presents Coughing since Sunday ,  After a particularly vilent coughing spell he had an episode of hemopatysis that was quite large,  None in the last 2 days.    Histoy of lung CA and metastatic papillary thyroid CA treated with RI ablation in July, No sinus drainage or sinus headache.     Past Medical History  Diagnosis Date  . Diabetes mellitus   . Hyperlipidemia   . Hypertension   . Myocardial infarction   . Cancer 2012    Stage IIA squamous cell Lung Ca  . Thyroid cancer 2013    s/p thyroidectomy 2014    Past Surgical History  Procedure Laterality Date  . Coronary artery bypass graft  2007  . Lung lobectomy         The following portions of the patient's history were reviewed and updated as appropriate: Allergies, current medications, and problem list.    Review of Systems:   12 Pt  review of systems was negative except those addressed in the HPI,     History   Social History  . Marital Status: Married    Spouse Name: N/A    Number of Children: N/A  . 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 Smoking status: Former Games developer  . Smokeless tobacco: Never  Used  . Alcohol Use: No  . Drug Use: No  . Sexual Activity: Not on file   Other Topics Concern  . Not on file   Social History Narrative  . No narrative on file    Objective:  Filed Vitals:   04/15/13 0835  BP: 118/68  Pulse: 88  Temp: 98.6 F (37 C)  Resp: 12     General appearance: alert, cooperative and appears stated age Ears: normal TM's and external ear canals both ears Throat: lips, mucosa, and tongue normal; teeth and gums normal. Some erythema bilaterally over tonsillar pillars Neck: no adenopathy, no carotid bruit, supple, symmetrical, trachea midline and thyroid not enlarged, symmetric, no tenderness/mass/nodules Back: symmetric, no curvature. ROM normal. No CVA tenderness. Lungs: clear to auscultation bilaterally Heart: regular rate and rhythm, S1, S2 normal, no murmur, click, rub or gallop Abdomen: soft, non-tender; bowel sounds normal; no masses,  no organomegaly Pulses: 2+ and symmetric Skin: Skin color, texture, turgor normal. No rashes or lesions Lymph nodes: Cervical, supraclavicular, and axillary nodes normal.  Assessment and Plan:  Hemoptysis Sounds congested but has no facial pain or sinus tenderness. Treated for sinusitis with  Levaquin,   sinus lavage and tussionex.  PA and lateral CXR was negative for pneumonia and new  masses.   Cancer No recurrence by recent PET scan August.  CXR today reassuring as well.    Updated Medication List Outpatient Encounter Prescriptions as of 04/15/2013  Medication Sig Dispense Refill  . aspirin 81 MG EC tablet Take 1 tablet (81 mg total) by mouth daily. Swallow whole.  30 tablet  12  . Calcium Acetate 667 MG TABS Take 2 capsules by mouth 2 (two) times daily.      . enalapril (VASOTEC) 10 MG tablet TAKE 1 TABLET BY MOUTH EVERY DAY  90 tablet  1  . levothyroxine (SYNTHROID, LEVOTHROID) 125 MCG tablet Take 125 mcg by mouth daily before breakfast.      . tamsulosin (FLOMAX) 0.4 MG CAPS capsule TAKE ONE CAPSULE BY  MOUTH EVERY DAY  90 capsule  1  . BAYER CONTOUR TEST test strip USE AS DIRECTED  100 each  11  . chlorpheniramine-HYDROcodone (TUSSIONEX) 10-8 MG/5ML LQCR Take 5 mLs by mouth every 12 (twelve) hours as needed.  180 mL  0  . Lactobacillus-Inulin (CULTURE[place]IGESTIVE HEALTH) CAPS Take 1 capsule by mouth daily.  14 capsule  0  . levofloxacin (LEVAQUIN) 500 MG tablet Take 1 tablet (500 mg total) by mouth daily.  7 tablet  0  . [DISCONTINUED] azithromycin (ZITHROMAX) 500 MG tablet Take 1 tablet (500 mg total) by mouth daily.  7 tablet  0   No facility-administered encounter medications on file as of 04/15/2013.

## 2013-04-15 NOTE — Patient Instructions (Signed)
I am prescribing you  tussionex  a 7 day course of levaquin  For your cough.  Flush your sinuses twice daily with Simply Saline  Please go get your chest x ray today or tomorrow  Please take a probiotic ( Align, Floraque or Culturelle) for two weeks whenever you are on the antibiotic to prevent a serious antibiotic associatedinfection that causes diarrhea  Called clostirudium dificile colitis

## 2013-04-16 ENCOUNTER — Telehealth: Payer: Self-pay | Admitting: Internal Medicine

## 2013-04-16 NOTE — Assessment & Plan Note (Signed)
No recurrence by recent PET scan August.  CXR today reassuring as well.

## 2013-04-16 NOTE — Telephone Encounter (Signed)
Patient notified of results and has noted no hemoptysis in last 48 hours.

## 2013-04-16 NOTE — Telephone Encounter (Signed)
Chest x ray was normal except for COPD changes.  No new masses,  No pneumonia.  Has his hemoptysis stopped?

## 2013-05-05 ENCOUNTER — Encounter: Payer: Self-pay | Admitting: Internal Medicine

## 2013-05-16 LAB — HM DIABETES EYE EXAM: HM Diabetic Eye Exam: NORMAL

## 2013-06-05 ENCOUNTER — Ambulatory Visit: Payer: 59 | Admitting: Internal Medicine

## 2013-07-08 ENCOUNTER — Ambulatory Visit: Payer: Self-pay | Admitting: Oncology

## 2013-07-08 LAB — CREATININE, SERUM
Creatinine: 2.09 mg/dL — ABNORMAL HIGH (ref 0.60–1.30)
EGFR (African American): 37 — ABNORMAL LOW
EGFR (Non-African Amer.): 32 — ABNORMAL LOW

## 2013-07-08 IMAGING — CT CT CHEST W/ CM
2 of 3 series · 15 of 36 positions shown, 18 images · IV contrast (agent unspecified)
Comparison: CT CHEST W/O CM dated [DATE]; CT CHEST W/O CM dated
[DATE]

CLINICAL DATA: Staging squamous cell carcinoma, shortness of
breath.

EXAM:
CT CHEST WITH CONTRAST
TECHNIQUE: Multidetector CT imaging of the chest was performed during
intravenous contrast administration.
CONTRAST:  50 cc [CN].

[Series 2: soft tissue · axial · 0.70mm/px · z∈[-677,-387]mm · 12 of 68 slices shown, 15 images]
[im 5/68  mediastinal]
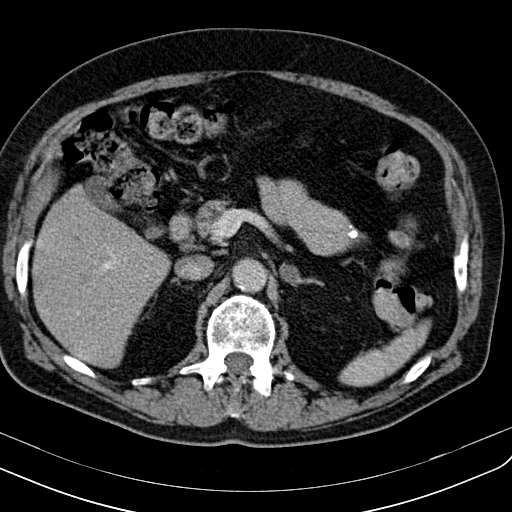
[im 5/68  lung]
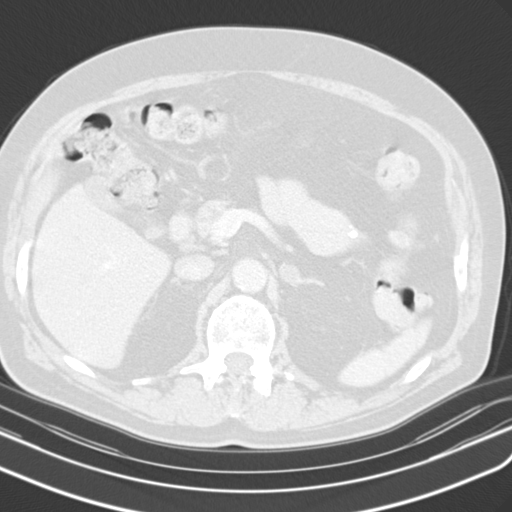
[im 10/68  lung]
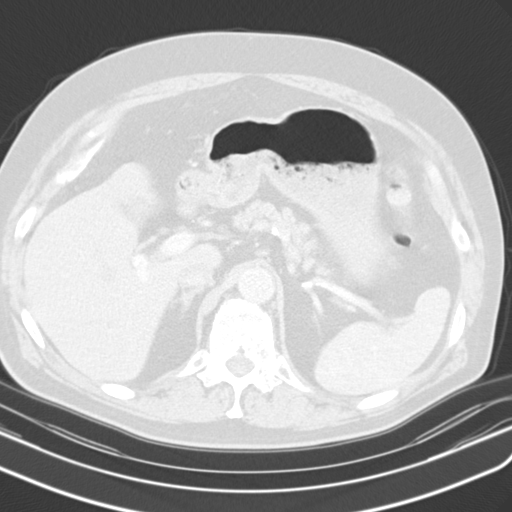
[im 15/68  lung]
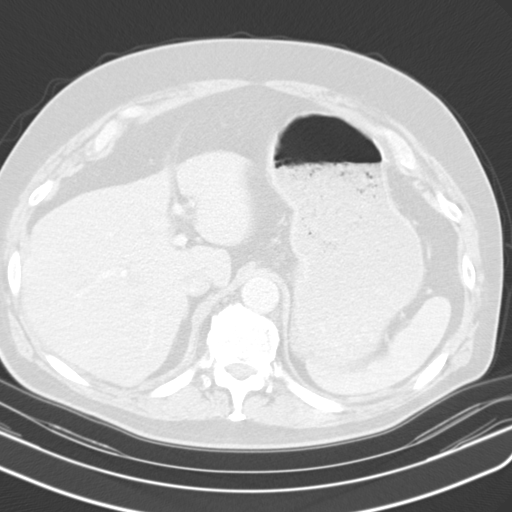
[im 20/68  lung]
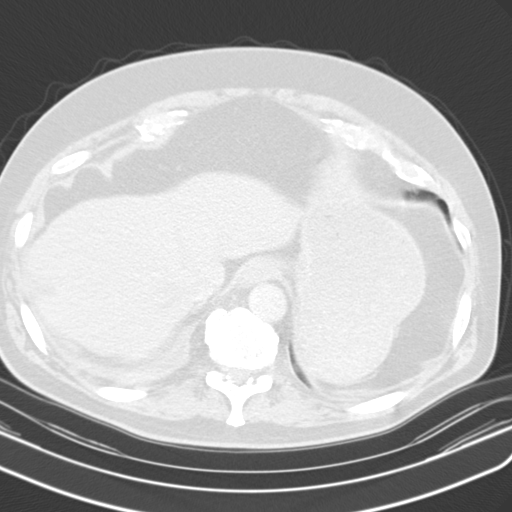
[im 25/68  mediastinal]
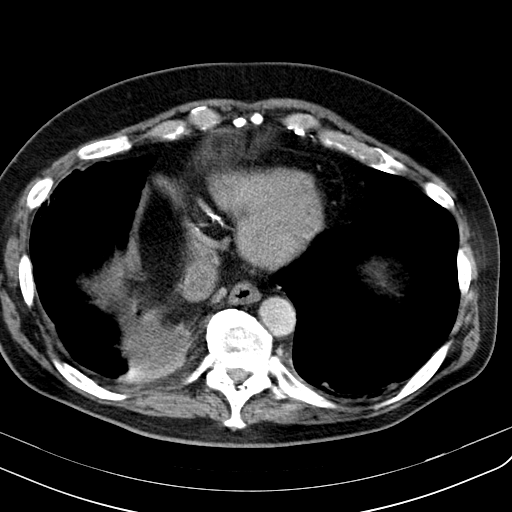
[im 25/68  lung]
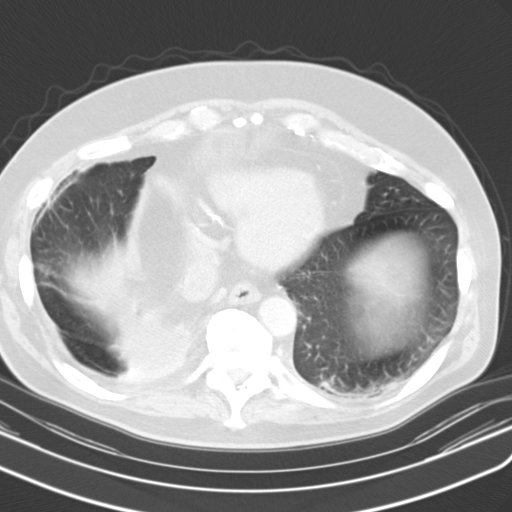
[im 30/68  lung]
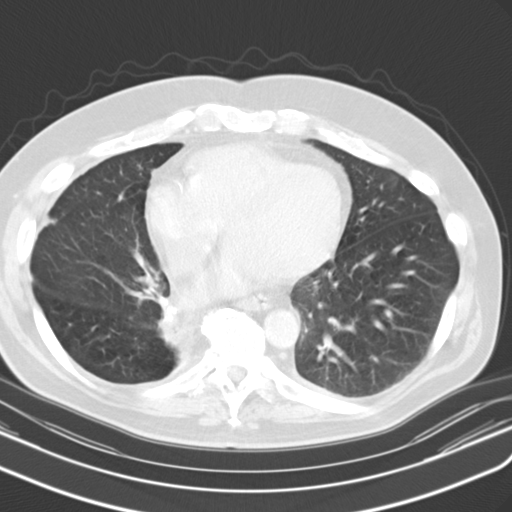
[im 38/68  lung]
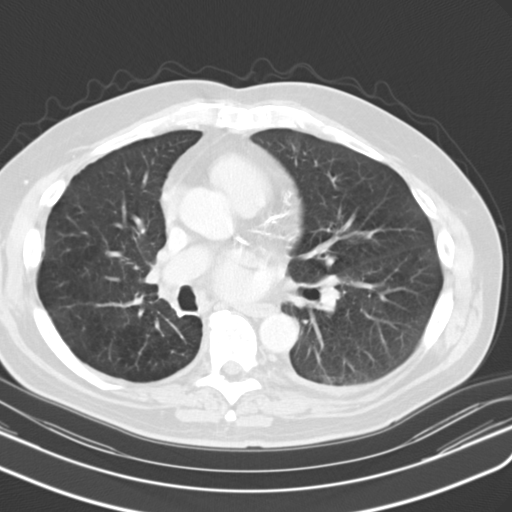
[im 43/68  lung]
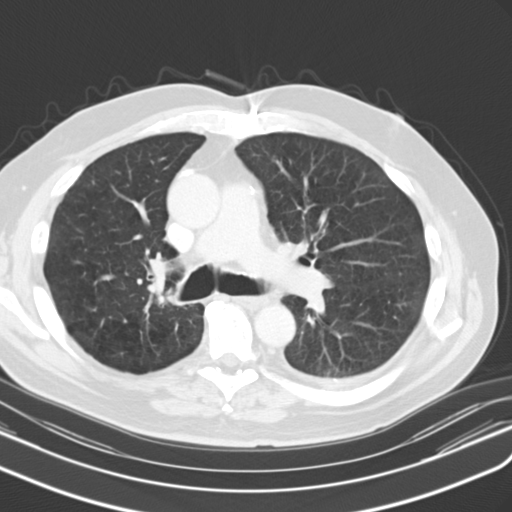
[im 48/68  mediastinal]
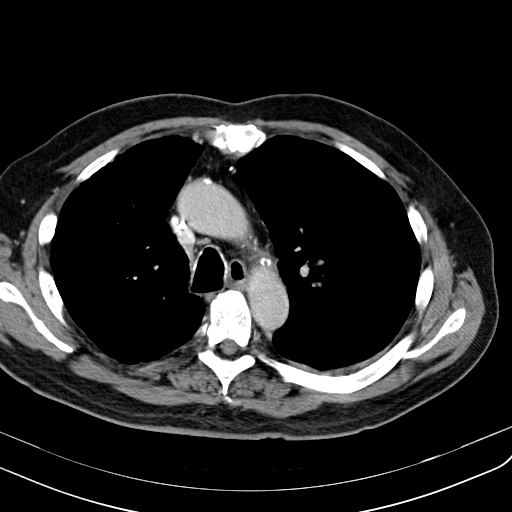
[im 48/68  lung]
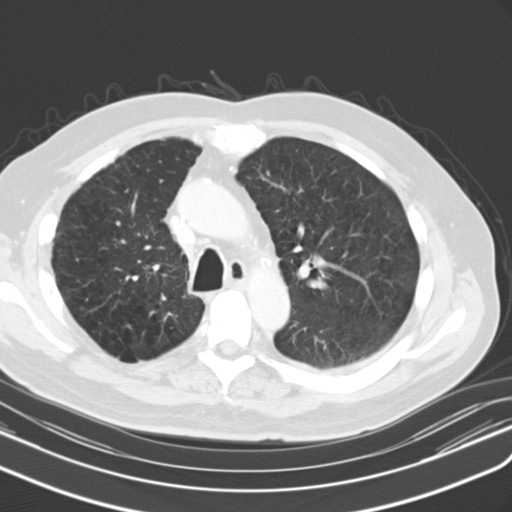
[im 53/68  lung]
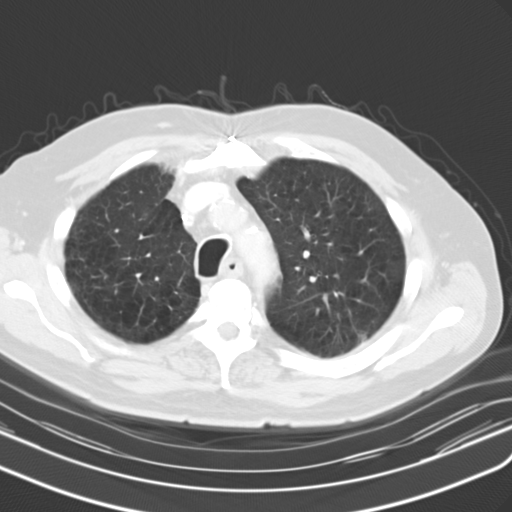
[im 58/68  lung]
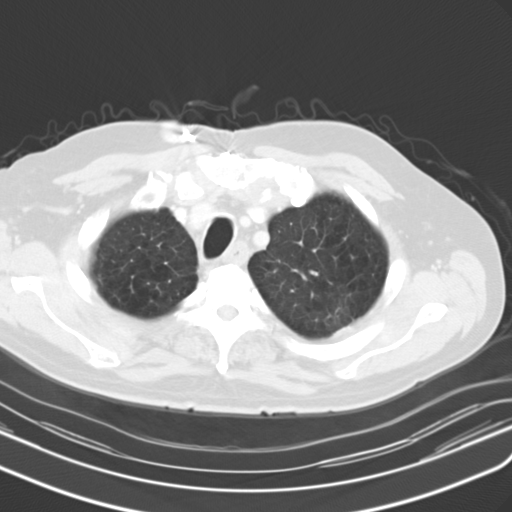
[im 63/68  lung]
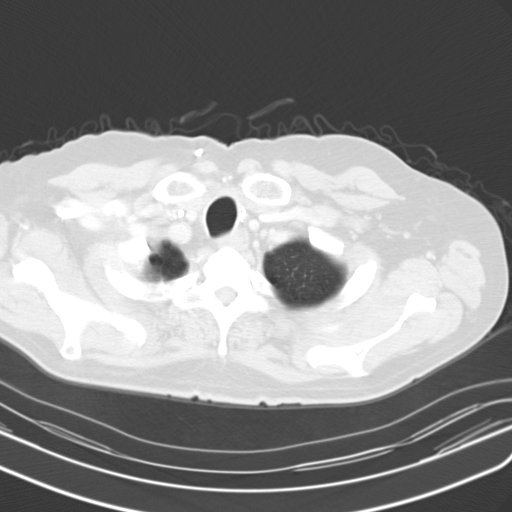

[Series 602: cor · coronal · 0.70mm/px · 3 of 106 slices shown]
[im 22/106  lung]
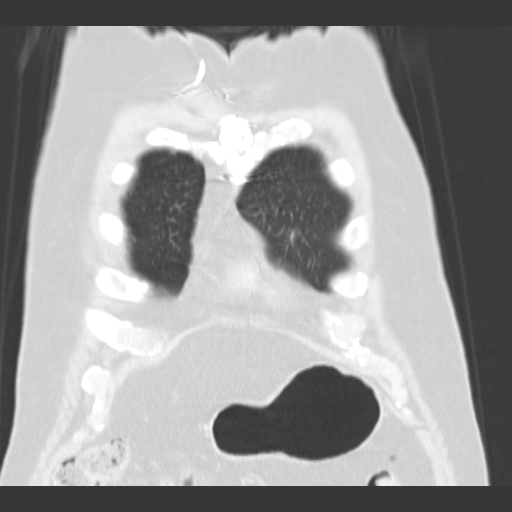
[im 43/106  lung]
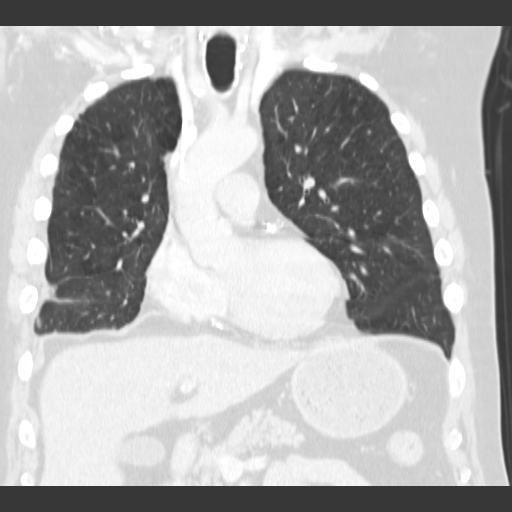
[im 64/106  lung]
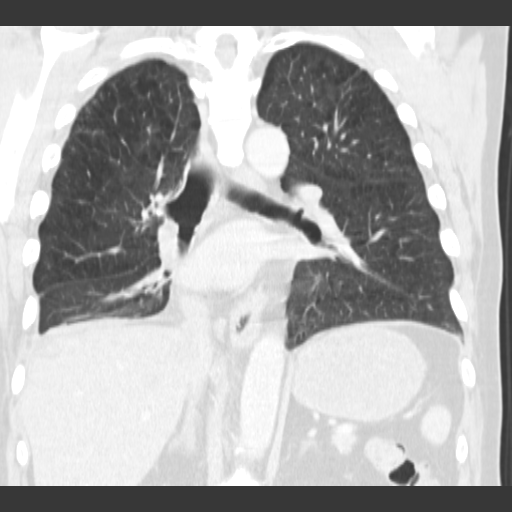

[15 of 36 positions shown; findings below may reference images not displayed]

FINDINGS: No pathologically enlarged mediastinal, hilar or axillary lymph
nodes. Postoperative changes of thyroidectomy. Right IJ Port-A-Cath
terminates in the SVC. Heart is at the upper limits of normal in
size. No pericardial effusion.

Centrilobular emphysema. Postoperative changes of right lower
lobectomy with associated scarring and volume loss in the right hemi
thorax. Small right fibrothorax is unchanged at the posterior medial
base of the right hemi thorax. Subpleural opacity in the posterior
left upper lobe is unchanged from [DATE]. Scarring and
atelectasis dependently in the left lower lobe. Scarring in the
lingula. Airway is unremarkable. Mid esophagus appears somewhat
thickened, as before.

Incidental imaging of the upper abdomen shows small stones layering
in the gallbladder. A 1.4 x 1.8 cm low-attenuation nodule in the
left adrenal gland is unchanged. No worrisome lytic or sclerotic
lesions. Degenerative changes are seen in the spine.
IMPRESSION: 1. Postoperative changes of right lower lobectomy without evidence
of recurrent or metastatic disease.
2. Stable small right fibrothorax.
3. Cholelithiasis.
4. Left adrenal adenoma.

## 2013-07-15 ENCOUNTER — Ambulatory Visit: Payer: Self-pay | Admitting: Oncology

## 2013-07-15 ENCOUNTER — Ambulatory Visit (INDEPENDENT_AMBULATORY_CARE_PROVIDER_SITE_OTHER): Payer: 59 | Admitting: Internal Medicine

## 2013-07-15 ENCOUNTER — Encounter (INDEPENDENT_AMBULATORY_CARE_PROVIDER_SITE_OTHER): Payer: Self-pay

## 2013-07-15 ENCOUNTER — Encounter: Payer: Self-pay | Admitting: Internal Medicine

## 2013-07-15 VITALS — BP 102/60 | HR 80 | Temp 97.6°F | Resp 16 | Wt 177.2 lb

## 2013-07-15 DIAGNOSIS — E1129 Type 2 diabetes mellitus with other diabetic kidney complication: Secondary | ICD-10-CM

## 2013-07-15 DIAGNOSIS — R0609 Other forms of dyspnea: Secondary | ICD-10-CM

## 2013-07-15 DIAGNOSIS — N183 Chronic kidney disease, stage 3 unspecified: Secondary | ICD-10-CM

## 2013-07-15 DIAGNOSIS — E890 Postprocedural hypothyroidism: Secondary | ICD-10-CM

## 2013-07-15 DIAGNOSIS — I10 Essential (primary) hypertension: Secondary | ICD-10-CM

## 2013-07-15 DIAGNOSIS — R0989 Other specified symptoms and signs involving the circulatory and respiratory systems: Secondary | ICD-10-CM

## 2013-07-15 DIAGNOSIS — E039 Hypothyroidism, unspecified: Secondary | ICD-10-CM

## 2013-07-15 DIAGNOSIS — Z23 Encounter for immunization: Secondary | ICD-10-CM

## 2013-07-15 DIAGNOSIS — E785 Hyperlipidemia, unspecified: Secondary | ICD-10-CM

## 2013-07-15 DIAGNOSIS — E119 Type 2 diabetes mellitus without complications: Secondary | ICD-10-CM

## 2013-07-15 DIAGNOSIS — C3490 Malignant neoplasm of unspecified part of unspecified bronchus or lung: Secondary | ICD-10-CM

## 2013-07-15 LAB — COMPREHENSIVE METABOLIC PANEL
ALT: 24 U/L (ref 0–53)
AST: 19 U/L (ref 0–37)
Albumin: 4 g/dL (ref 3.5–5.2)
Alkaline Phosphatase: 98 U/L (ref 39–117)
BUN: 24 mg/dL — AB (ref 6–23)
CALCIUM: 9 mg/dL (ref 8.4–10.5)
CO2: 26 mEq/L (ref 19–32)
CREATININE: 2.2 mg/dL — AB (ref 0.4–1.5)
Chloride: 104 mEq/L (ref 96–112)
GFR: 32.71 mL/min — ABNORMAL LOW (ref >60.00)
Glucose, Bld: 111 mg/dL — ABNORMAL HIGH (ref 70–99)
Potassium: 4.8 mEq/L (ref 3.5–5.1)
Sodium: 138 mEq/L (ref 135–145)
Total Bilirubin: 0.6 mg/dL (ref 0.3–1.2)
Total Protein: 7.9 g/dL (ref 6.0–8.3)

## 2013-07-15 LAB — MICROALBUMIN / CREATININE URINE RATIO
CREATININE, U: 274.4 mg/dL
MICROALB UR: 8.6 mg/dL — AB (ref 0.0–1.9)
Microalb Creat Ratio: 3.1 mg/g (ref 0.0–30.0)

## 2013-07-15 LAB — BASIC METABOLIC PANEL
Anion Gap: 8 (ref 7–16)
BUN: 25 mg/dL — ABNORMAL HIGH (ref 7–18)
CHLORIDE: 102 mmol/L (ref 98–107)
CREATININE: 2.2 mg/dL — AB (ref 0.60–1.30)
Calcium, Total: 8.8 mg/dL (ref 8.5–10.1)
Co2: 29 mmol/L (ref 21–32)
EGFR (African American): 35 — ABNORMAL LOW
GFR CALC NON AF AMER: 30 — AB
Glucose: 197 mg/dL — ABNORMAL HIGH (ref 65–99)
OSMOLALITY: 287 (ref 275–301)
Potassium: 4.2 mmol/L (ref 3.5–5.1)
SODIUM: 139 mmol/L (ref 136–145)

## 2013-07-15 LAB — HEMOGLOBIN A1C: HEMOGLOBIN A1C: 6.7 % — AB (ref 4.6–6.5)

## 2013-07-15 NOTE — Progress Notes (Signed)
[name], male   DOB: [birth year], 68 y.o.   MRN: [id number]  Patient Active Problem List   Diagnosis Date Noted  . Exertional dyspnea 07/16/2013  . 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 Thyroid cancer   . Cancer   . CAD (coronary artery disease), autologous vein bypass graft 07/12/2011  . Personal history of pneumonia 07/12/2011  . Type II or unspecified type diabetes mellitus with renal manifestations, not stated as uncontrolled(250.40) 07/12/2011  . Hyperlipidemia   . Hypertension   . Polyp of colon, adenomatous 04/08/2011    Subjective:  CC:   Chief Complaint  Patient presents with  . Follow-up    HPI:   Nathaniel Lane a 68 y.o. male who presents for follow up CAD  on dm type 2,  Lung CA,  Hypertension CAD and hyperlipidemia.  He received a  good report  From Oncology regarding his lung cancer's response to treatment.  He is gaining weight again and has had no subsequent episodes of hemoptysis.  Feels fine except for a few complaints.    Has developed a  numb feeling on the ball of his right foot.  Ball of foot a1c 6.1 in sept .   He becomes short of breath with climbing up a hill in the cold not otherwise.  No chest pain, orthopnea or diaphoresis.    Past Medical History  Diagnosis Date  . Diabetes mellitus   . Hyperlipidemia   . Hypertension   . Myocardial infarction   . Cancer 2012    Stage IIA squamous cell Lung Ca  . Thyroid cancer 2013    s/p thyroidectomy 2014    Past Surgical History  Procedure Laterality Date  . Coronary artery bypass graft  2007  . Lung lobectomy         The following portions of the patient's history were reviewed and updated as appropriate: Allergies, current medications, and problem list.    Review of Systems:   12 Pt  review of systems was negative except those addressed in the HPI,     History   Social History  . Marital Status: Married    Spouse Name: N/A    Number of Children: N/A  . Years  of Education: N/A   Occupational History  . Parts Delivery    Social History Main Topics  . Smoking status: Former Smoker    Quit date: 07/15/2005  . Smokeless tobacco: Never Used  . Alcohol Use: No  . Drug Use: No  . Sexual Activity: Not on file   Other Topics Concern  . Not on file   Social History Narrative  . No narrative on file    Objective:  Filed Vitals:   07/15/13 1049  BP: 102/60  Pulse: 80  Temp: 97.6 F (36.4 C)  Resp: 16     General appearance: alert, cooperative and appears stated age Ears: normal TM's and external ear canals both ears Throat: lips, mucosa, and tongue normal; teeth and gums normal Neck: no adenopathy, no carotid bruit, supple, symmetrical, trachea midline and thyroid not enlarged, symmetric, no tenderness/mass/nodules Back: symmetric, no curvature. ROM normal. No CVA tenderness. Lungs: clear to auscultation bilaterally Heart: regular rate and rhythm, S1, S2 normal, no murmur, click, rub or gallop Abdomen: soft, non-tender; bowel sounds normal; no masses,  no organomegaly Pulses: 2+ and symmetric Skin: Skin color, texture, turgor normal. No rashes or lesions Lymph nodes: Cervical, supraclavicular, and axillary nodes normal.  Assessment and Plan:  Exertional  dyspnea Etiology unclear,  He has known CAD,  Lung CA,  COPD,  And deconditioning.  Will have him follow up with cardiology for evaluation   Hypertension Well controlled on current regimen. Renal function stable, no changes today.  Lab Results  Component Value Date   CREATININE 2.2* 07/15/2013     Hyperlipidemia Untreated since he began cancer therapy.  We will resume lipitor now for LDL 178 and history of CAD  Postsurgical hypothyroidism Lab Results  Component Value Date   TSH 0.303* 07/15/2013   Secondary to surgical resection of thyroid cancer. Thyroid supplementation , goal TSH is 0.1 .  Will increase dose    Type II or unspecified type diabetes mellitus with renal  manifestations, not stated as uncontrolled(250.40) Well-controlled on diet alone by current  A1c 6.7 . He is up-to-date on eye exams and his foot exam is done with each visit due to chemotherapy induced neuropathy. It is unclear whether Dr Gabriel Carina has ordered lipids , CMET and a1c. With her follow up on his thyroid cancer.  He is on an ACE Inhibitor but no statin which will be restarted today. Lab Results  Component Value Date   HGBA1C 6.7* 07/15/2013      CKD (chronic kidney disease) stage 3, GFR 30-59 ml/min Continue losartan,  Adding statin now that chemo has finished  Squamous cell carcinoma of lung, stage II S/p lung resection,  Chemo and XRT    Updated Medication List Outpatient Encounter Prescriptions as of 07/15/2013  Medication Sig  . aspirin 81 MG EC tablet Take 1 tablet (81 mg total) by mouth daily. Swallow whole.  . Calcium Acetate 667 MG TABS Take 2 capsules by mouth 2 (two) times daily.  . enalapril (VASOTEC) 10 MG tablet TAKE 1 TABLET BY MOUTH EVERY DAY  . levothyroxine (SYNTHROID, LEVOTHROID) 137 MCG tablet Take 1 tablet (137 mcg total) by mouth daily before breakfast.  . tamsulosin (FLOMAX) 0.4 MG CAPS capsule TAKE ONE CAPSULE BY MOUTH EVERY DAY  . [DISCONTINUED] levothyroxine (SYNTHROID, LEVOTHROID) 125 MCG tablet Take 125 mcg by mouth daily before breakfast.  . atorvastatin (LIPITOR) 20 MG tablet Take 1 tablet (20 mg total) by mouth daily.  Marland Kitchen BAYER CONTOUR TEST test strip USE AS DIRECTED  . chlorpheniramine-HYDROcodone (TUSSIONEX) 10-8 MG/5ML LQCR Take 5 mLs by mouth every 12 (twelve) hours as needed.  . [DISCONTINUED] Lactobacillus-Inulin ([place]) CAPS Take 1 capsule by mouth daily.  . [DISCONTINUED] levofloxacin (LEVAQUIN) 500 MG tablet Take 1 tablet (500 mg total) by mouth daily.

## 2013-07-15 NOTE — Patient Instructions (Addendum)
I'm glad your cancer is in remission.  Your weight is fine !!   Diabetes labs today  You received the New pneumonia vaccine  today

## 2013-07-16 ENCOUNTER — Encounter: Payer: Self-pay | Admitting: Internal Medicine

## 2013-07-16 DIAGNOSIS — N183 Chronic kidney disease, stage 3 unspecified: Secondary | ICD-10-CM | POA: Insufficient documentation

## 2013-07-16 DIAGNOSIS — N1831 Chronic kidney disease, stage 3a: Secondary | ICD-10-CM | POA: Insufficient documentation

## 2013-07-16 DIAGNOSIS — E1122 Type 2 diabetes mellitus with diabetic chronic kidney disease: Secondary | ICD-10-CM | POA: Insufficient documentation

## 2013-07-16 DIAGNOSIS — R0609 Other forms of dyspnea: Secondary | ICD-10-CM

## 2013-07-16 LAB — TSH+FREE T4
FREE T4: 1.41 ng/dL (ref 0.82–1.77)
TSH: 0.303 u[IU]/mL — ABNORMAL LOW (ref 0.450–4.500)

## 2013-07-16 LAB — HM DIABETES FOOT EXAM: HM Diabetic Foot Exam: ABNORMAL

## 2013-07-16 MED ORDER — ATORVASTATIN CALCIUM 20 MG PO TABS: 20.0000 mg | ORAL_TABLET | Freq: Every day | ORAL | Status: DC

## 2013-07-16 MED ORDER — LEVOTHYROXINE SODIUM 137 MCG PO TABS: 137.0000 ug | ORAL_TABLET | Freq: Every day | ORAL | Status: DC

## 2013-07-16 NOTE — Assessment & Plan Note (Signed)
Well controlled on current regimen. Renal function stable, no changes today.  Lab Results  Component Value Date   CREATININE 2.2* 07/15/2013

## 2013-07-16 NOTE — Assessment & Plan Note (Signed)
Continue losartan,  Adding statin now that chemo has finished

## 2013-07-16 NOTE — Assessment & Plan Note (Addendum)
Lab Results  Component Value Date   TSH 0.303* 07/15/2013   Secondary to surgical resection of thyroid cancer. Thyroid supplementation , goal TSH is 0.1 .  Will increase dose

## 2013-07-16 NOTE — Assessment & Plan Note (Signed)
S/p lung resection,  Chemo and XRT

## 2013-07-16 NOTE — Assessment & Plan Note (Signed)
Well-controlled on diet alone by current  A1c 6.7 . He is up-to-date on eye exams and his foot exam is done with each visit due to chemotherapy induced neuropathy. It is unclear whether Dr Gabriel Carina has ordered lipids , CMET and a1c. With her follow up on his thyroid cancer.  He is on an ACE Inhibitor but no statin which will be restarted today. Lab Results  Component Value Date   HGBA1C 6.7* 07/15/2013

## 2013-07-16 NOTE — Assessment & Plan Note (Signed)
Etiology unclear,  He has known CAD,  Lung CA,  COPD,  And deconditioning.  Will have him follow up with cardiology for evaluation

## 2013-07-16 NOTE — Assessment & Plan Note (Signed)
Untreated since he began cancer therapy.  We will resume lipitor now for LDL 178 and history of CAD

## 2013-07-17 ENCOUNTER — Telehealth: Payer: Self-pay

## 2013-07-17 NOTE — Telephone Encounter (Signed)
Relevant patient education mailed to patient.  

## 2013-07-26 ENCOUNTER — Ambulatory Visit: Payer: Self-pay | Admitting: Oncology

## 2013-08-04 ENCOUNTER — Other Ambulatory Visit: Payer: Self-pay | Admitting: Internal Medicine

## 2013-08-10 ENCOUNTER — Other Ambulatory Visit: Payer: Self-pay | Admitting: Internal Medicine

## 2013-09-05 ENCOUNTER — Other Ambulatory Visit: Payer: Self-pay | Admitting: Internal Medicine

## 2013-09-10 ENCOUNTER — Other Ambulatory Visit: Payer: Self-pay | Admitting: Internal Medicine

## 2013-09-14 ENCOUNTER — Other Ambulatory Visit: Payer: Self-pay | Admitting: Internal Medicine

## 2013-09-15 NOTE — Telephone Encounter (Signed)
Refill called to pharmacy refill from 09/06/13 not received by pharmacy.

## 2013-10-19 ENCOUNTER — Ambulatory Visit: Payer: 59 | Admitting: Internal Medicine

## 2013-10-26 ENCOUNTER — Ambulatory Visit (INDEPENDENT_AMBULATORY_CARE_PROVIDER_SITE_OTHER): Payer: 59 | Admitting: Internal Medicine

## 2013-10-26 ENCOUNTER — Encounter: Payer: Self-pay | Admitting: Internal Medicine

## 2013-10-26 VITALS — BP 120/80 | HR 65 | Temp 97.7°F | Resp 16 | Wt 183.2 lb

## 2013-10-26 DIAGNOSIS — C3490 Malignant neoplasm of unspecified part of unspecified bronchus or lung: Secondary | ICD-10-CM

## 2013-10-26 DIAGNOSIS — E119 Type 2 diabetes mellitus without complications: Secondary | ICD-10-CM

## 2013-10-26 DIAGNOSIS — E1129 Type 2 diabetes mellitus with other diabetic kidney complication: Secondary | ICD-10-CM

## 2013-10-26 DIAGNOSIS — G609 Hereditary and idiopathic neuropathy, unspecified: Secondary | ICD-10-CM

## 2013-10-26 DIAGNOSIS — N183 Chronic kidney disease, stage 3 unspecified: Secondary | ICD-10-CM

## 2013-10-26 DIAGNOSIS — E785 Hyperlipidemia, unspecified: Secondary | ICD-10-CM

## 2013-10-26 DIAGNOSIS — F411 Generalized anxiety disorder: Secondary | ICD-10-CM

## 2013-10-26 NOTE — Patient Instructions (Signed)
Return for fasting labsand a repeat A1c  Your neuropathy can be treated with gabapentin if you desire,  Just let me know

## 2013-10-26 NOTE — Progress Notes (Signed)
Pre-visit discussion using our clinic review tool. No additional management support is needed unless otherwise documented below in the visit note.  

## 2013-10-26 NOTE — Progress Notes (Signed)
[name], male   DOB: Sep 15, 1945, 68 y.o.   MRN: [id number]  Patient Active Problem List   Diagnosis Date Noted  . Anxiety state, unspecified 10/27/2013  . Exertional dyspnea 07/16/2013  . CKD (chronic kidney disease) stage 3, GFR 30-59 ml/min 07/16/2013  . 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 Thyroid cancer   . Squamous cell lung cancer   . CAD (coronary artery disease), autologous vein bypass graft 07/12/2011  . Personal history of pneumonia 07/12/2011  . Type II or unspecified type diabetes mellitus with renal manifestations, not stated as uncontrolled(250.40) 07/12/2011  . Hyperlipidemia   . Hypertension   . Polyp of colon, adenomatous 04/08/2011    Subjective:  CC:   Chief Complaint  Patient presents with  . Follow-up    HPI:   CRIAG Lane is a 68 y.o. male who presents for DM follow up .  Has gained weight sine last visit.  Appetite good. Still having numbness affecting the balls of his feet,  Not keeping him up at night,  No burning sensation    Planning a round trip to [place] driving with 2 other cousins.   Some anxiety since his wife was diagnosed with dementia recently at age 29. Irritable,  Impatient but able to conrol temper.   Does not want to take medications at this time,.  Tearful with discussion today,    Having watery stools today,  at least  5 .  Feels fine, no fevers or cramping.  No recent antibiotic use.  But ate pizza yesterday with sausage and pepperoni.    Past Medical History  Diagnosis Date  . Diabetes mellitus   . Hyperlipidemia   . Hypertension   . Myocardial infarction   . Cancer 2012    Stage IIA squamous cell Lung Ca  . Thyroid cancer 2013    s/p thyroidectomy 2014    Past Surgical History  Procedure Laterality Date  . Coronary artery bypass graft  2007  . Lung lobectomy         The following portions of the patient's history were reviewed and updated as appropriate: Allergies, current  medications, and problem list.    Review of Systems:   Patient denies headache, fevers, malaise, unintentional weight loss, skin rash, eye pain, sinus congestion and sinus pain, sore throat, dysphagia,  hemoptysis , cough, dyspnea, wheezing, chest pain, palpitations, orthopnea, edema, abdominal pain, nausea, melena, diarrhea, constipation, flank pain, dysuria, hematuria, urinary  Frequency, nocturia, numbness, tingling, seizures,  Focal weakness, Loss of consciousness,  Tremor, insomnia, depression, anxiety, and suicidal ideation.     History   Social History  . Marital Status: Married    Spouse Name: N/A    Number of Children: N/A  . 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 Smoking status: Former Smoker    Quit date: 07/15/2005  . Smokeless tobacco: Never Used  . Alcohol Use: No  . Drug Use: No  . Sexual Activity: Not on file   Other Topics Concern  . Not on file   Social History Narrative  . No narrative on file    Objective:  Filed Vitals:   10/26/13 1811  BP: 120/80  Pulse: 65  Temp: 97.7 F (36.5 C)  Resp: 16     General appearance: alert, cooperative and appears stated age Ears: normal TM's and external ear canals both ears Throat: lips, mucosa, and tongue  normal; teeth and gums normal Neck: no adenopathy, no carotid bruit, supple, symmetrical, trachea midline and thyroid not enlarged, symmetric, no tenderness/mass/nodules Back: symmetric, no curvature. ROM normal. No CVA tenderness. Lungs: clear to auscultation bilaterally Heart: regular rate and rhythm, S1, S2 normal, no murmur, click, rub or gallop Abdomen: soft, non-tender; bowel sounds normal; no masses,  no organomegaly Pulses: 2+ and symmetric Skin: Skin color, texture, turgor normal. No rashes or lesions Lymph nodes: Cervical, supraclavicular, and axillary nodes normal.  Assessment and Plan:  CKD (chronic kidney disease) stage 3, GFR  30-59 ml/min Continue losartan,  Now on statin  Lab Results  Component Value Date   CREATININE 2.2* 07/15/2013   Lab Results  Component Value Date   CHOL 242* 03/13/2013   HDL 31.40* 03/13/2013   LDLCALC 131 08/07/2012   LDLDIRECT 178.5 03/13/2013   TRIG 147.0 03/13/2013   CHOLHDL 8 03/13/2013      Type II or unspecified type diabetes mellitus with renal manifestations, not stated as uncontrolled(250.40) Well-controlled on diet alone bylast  A1c 6.7 . He is up-to-date on eye exams and his foot exam is done with each visit due to chemotherapy induced neuropathy. It is unclear whether Dr Gabriel Carina has ordered lipids , CMET and a1c. With her follow up on his thyroid cancer.  He is on an ACE Inhibitor but no statin which will be restarted today. Lab Results  Component Value Date   HGBA1C 6.7* 07/15/2013        Anxiety state, unspecified Secondary to wife's recent diagnosis of AD.  Long discussion today about caregiver fatigue,.  No meds accepted.   Squamous cell lung cancer S/p lung resection,  Chemo and XRT.  Regular follow up with [facility].       Updated Medication List Outpatient Encounter Prescriptions as of 10/26/2013  Medication Sig  . atorvastatin (LIPITOR) 20 MG tablet Take 1 tablet (20 mg total) by mouth daily.  Marland Kitchen BAYER CONTOUR TEST test strip USE AS DIRECTED  . [facility] ASPIRIN LOW DOSE 81 MG EC tablet TAKE 1 TABLET (81 MG TOTAL) BY MOUTH DAILY. SWALLOW WHOLE.  . enalapril (VASOTEC) 10 MG tablet TAKE 1 TABLET BY MOUTH EVERY DAY  . levothyroxine (SYNTHROID, LEVOTHROID) 137 MCG tablet Take 1 tablet (137 mcg total) by mouth daily before breakfast.  . tamsulosin (FLOMAX) 0.4 MG CAPS capsule TAKE ONE CAPSULE BY MOUTH EVERY DAY  . Calcium Acetate 667 MG TABS Take 2 capsules by mouth 2 (two) times daily.  . chlorpheniramine-HYDROcodone (TUSSIONEX) 10-8 MG/5ML LQCR Take 5 mLs by mouth every 12 (twelve) hours as needed.  . [DISCONTINUED] enalapril (VASOTEC) 10 MG tablet TAKE 1  TABLET BY MOUTH EVERY DAY     Orders Placed This Encounter  Procedures  . Comprehensive metabolic panel  . Lipid panel  . Hemoglobin A1c  . Microalbumin / creatinine urine ratio  . TSH  . Folate RBC  . Vitamin B12  . HM DIABETES FOOT EXAM    No Follow-up on file.

## 2013-10-27 DIAGNOSIS — F411 Generalized anxiety disorder: Secondary | ICD-10-CM | POA: Insufficient documentation

## 2013-10-27 LAB — HM DIABETES FOOT EXAM: HM Diabetic Foot Exam: ABNORMAL

## 2013-10-27 NOTE — Assessment & Plan Note (Signed)
Secondary to wife's recent diagnosis of AD.  Long discussion today about caregiver fatigue,.  No meds accepted.

## 2013-10-27 NOTE — Assessment & Plan Note (Signed)
S/p lung resection,  Chemo and XRT.  Regular follow up with [facility].

## 2013-10-27 NOTE — Assessment & Plan Note (Signed)
Well-controlled on diet alone bylast  A1c 6.7 . He is up-to-date on eye exams and his foot exam is done with each visit due to chemotherapy induced neuropathy. It is unclear whether Dr Gabriel Carina has ordered lipids , CMET and a1c. With her follow up on his thyroid cancer.  He is on an ACE Inhibitor but no statin which will be restarted today. Lab Results  Component Value Date   HGBA1C 6.7* 07/15/2013

## 2013-10-27 NOTE — Assessment & Plan Note (Signed)
Continue losartan,  Now on statin  Lab Results  Component Value Date   CREATININE 2.2* 07/15/2013   Lab Results  Component Value Date   CHOL 242* 03/13/2013   HDL 31.40* 03/13/2013   LDLCALC 131 08/07/2012   LDLDIRECT 178.5 03/13/2013   TRIG 147.0 03/13/2013   CHOLHDL 8 03/13/2013

## 2013-10-30 ENCOUNTER — Other Ambulatory Visit (INDEPENDENT_AMBULATORY_CARE_PROVIDER_SITE_OTHER): Payer: 59

## 2013-10-30 DIAGNOSIS — E785 Hyperlipidemia, unspecified: Secondary | ICD-10-CM

## 2013-10-30 DIAGNOSIS — E119 Type 2 diabetes mellitus without complications: Secondary | ICD-10-CM

## 2013-10-30 DIAGNOSIS — G609 Hereditary and idiopathic neuropathy, unspecified: Secondary | ICD-10-CM

## 2013-10-30 LAB — HEMOGLOBIN A1C: Hgb A1c MFr Bld: 7.4 % — ABNORMAL HIGH (ref 4.6–6.5)

## 2013-10-30 LAB — COMPREHENSIVE METABOLIC PANEL
ALBUMIN: 4.2 g/dL (ref 3.5–5.2)
ALT: 29 U/L (ref 0–53)
AST: 25 U/L (ref 0–37)
Alkaline Phosphatase: 101 U/L (ref 39–117)
BUN: 30 mg/dL — AB (ref 6–23)
CALCIUM: 9.6 mg/dL (ref 8.4–10.5)
CO2: 25 mEq/L (ref 19–32)
Chloride: 110 mEq/L (ref 96–112)
Creatinine, Ser: 1.9 mg/dL — ABNORMAL HIGH (ref 0.4–1.5)
GFR: 39.08 mL/min — ABNORMAL LOW (ref >60.00)
Glucose, Bld: 158 mg/dL — ABNORMAL HIGH (ref 70–99)
POTASSIUM: 5 meq/L (ref 3.5–5.1)
Sodium: 142 mEq/L (ref 135–145)
Total Bilirubin: 0.8 mg/dL (ref 0.2–1.2)
Total Protein: 7.7 g/dL (ref 6.0–8.3)

## 2013-10-30 LAB — VITAMIN B12: VITAMIN B 12: 244 pg/mL (ref 211–911)

## 2013-10-30 LAB — LIPID PANEL
Cholesterol: 162 mg/dL (ref 0–200)
HDL: 31.7 mg/dL — ABNORMAL LOW (ref >39.00)
LDL Cholesterol: 88 mg/dL (ref 0–99)
TRIGLYCERIDES: 211 mg/dL — AB (ref 0.0–149.0)
Total CHOL/HDL Ratio: 5
VLDL: 42.2 mg/dL — ABNORMAL HIGH (ref 0.0–40.0)

## 2013-10-30 LAB — MICROALBUMIN / CREATININE URINE RATIO
CREATININE, U: 140.8 mg/dL
MICROALB/CREAT RATIO: 5.5 mg/g (ref 0.0–30.0)
Microalb, Ur: 7.8 mg/dL — ABNORMAL HIGH (ref 0.0–1.9)

## 2013-10-30 LAB — TSH: TSH: 1.18 u[IU]/mL (ref 0.35–4.50)

## 2013-10-31 LAB — FOLATE RBC: RBC Folate: 463 ng/mL (ref >280)

## 2013-11-02 ENCOUNTER — Encounter: Payer: Self-pay | Admitting: *Deleted

## 2013-12-31 ENCOUNTER — Other Ambulatory Visit: Payer: Self-pay | Admitting: Internal Medicine

## 2014-01-07 ENCOUNTER — Ambulatory Visit: Payer: Self-pay | Admitting: Oncology

## 2014-01-07 IMAGING — CT CT CHEST W/O CM
2 of 3 series · 17 of 46 positions shown, 19 images · non-contrast
Comparison: Chest CTs dated [DATE] and [DATE].

CLINICAL DATA: Squamous cell lung cancer status post partial lung
resection and chemotherapy. Shortness of breath with exertion.

EXAM:
CT CHEST WITHOUT CONTRAST
TECHNIQUE: Multidetector CT imaging of the chest was performed following the
standard protocol without IV contrast..

[Series 2: routine chest wo · axial · 0.76mm/px · z∈[-392,-122]mm · 14 of 63 slices shown, 16 images]
[im 5/63  soft-tissue]
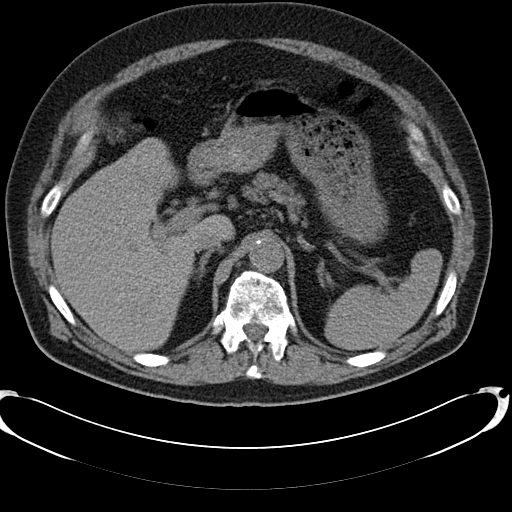
[im 5/63  bone]
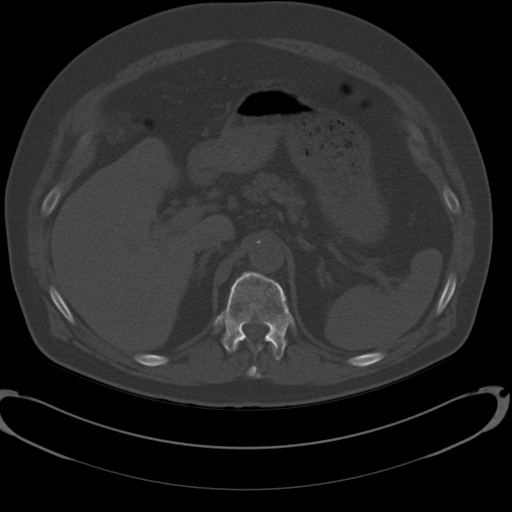
[im 9/63  soft-tissue]
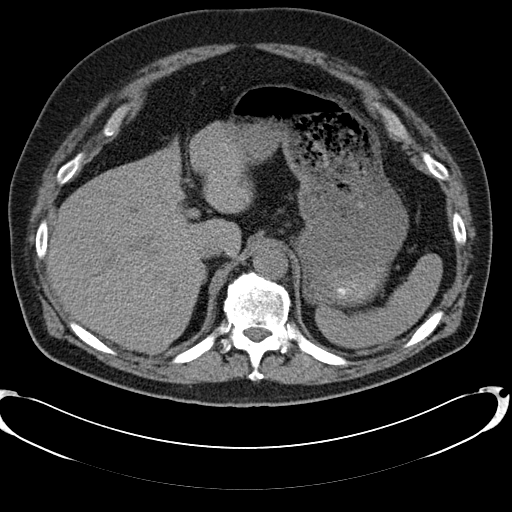
[im 13/63  soft-tissue]
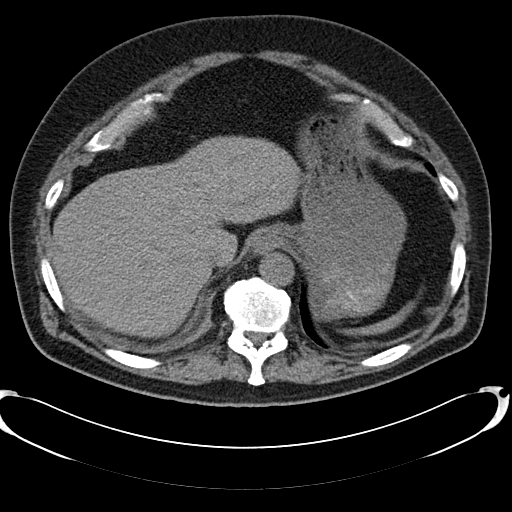
[im 17/63  soft-tissue]
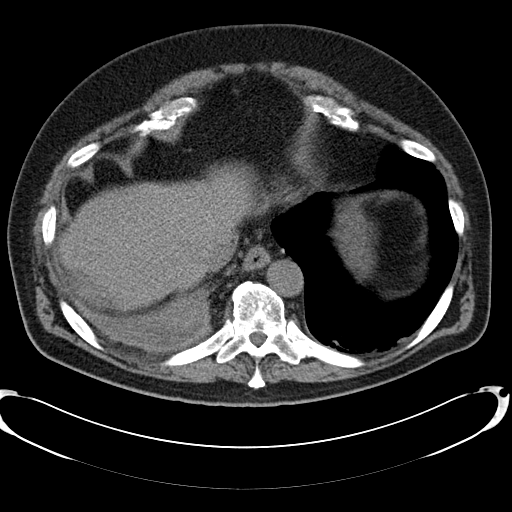
[im 21/63  soft-tissue]
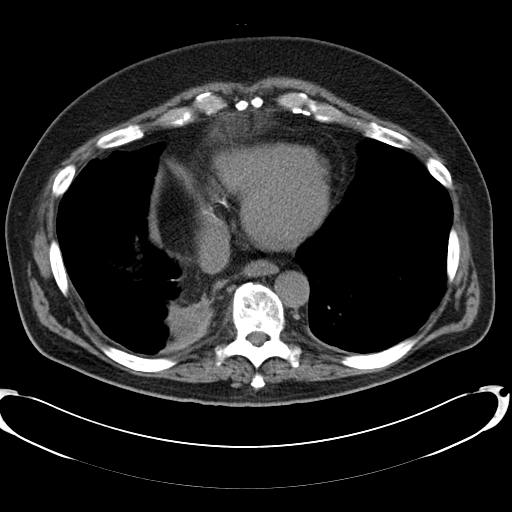
[im 25/63  soft-tissue]
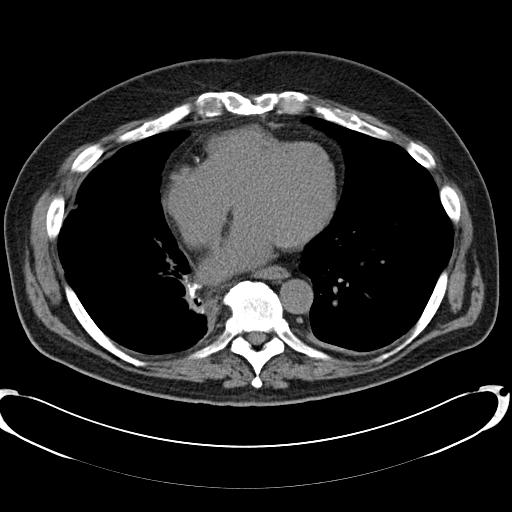
[im 29/63  soft-tissue]
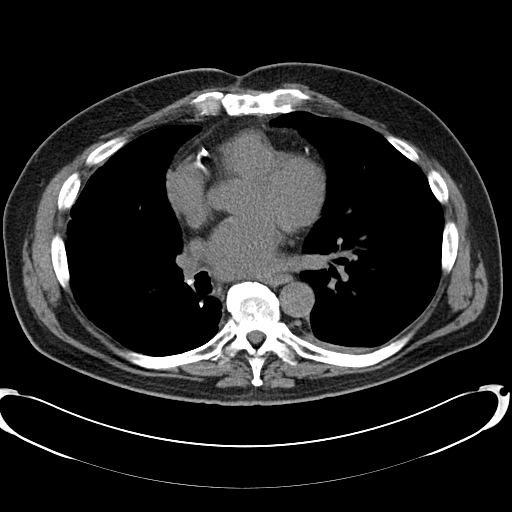
[im 35/63  soft-tissue]
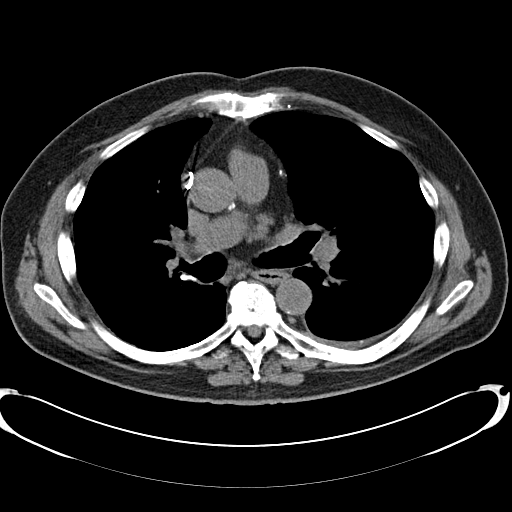
[im 39/63  soft-tissue]
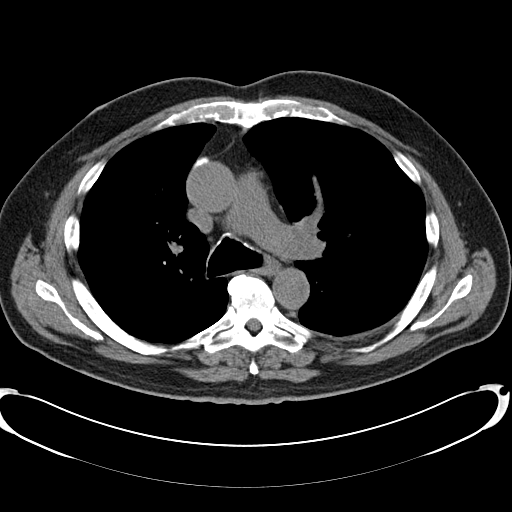
[im 39/63  bone]
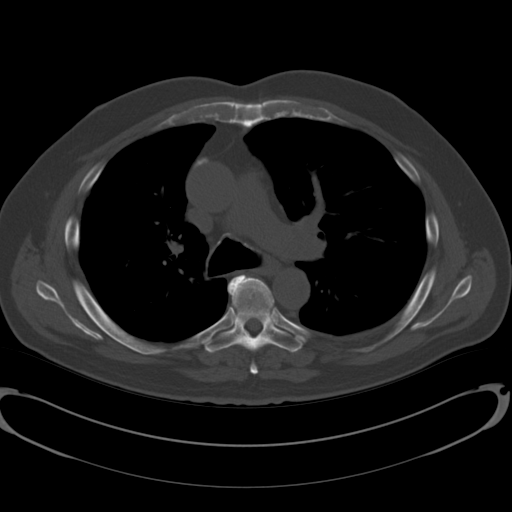
[im 43/63  soft-tissue]
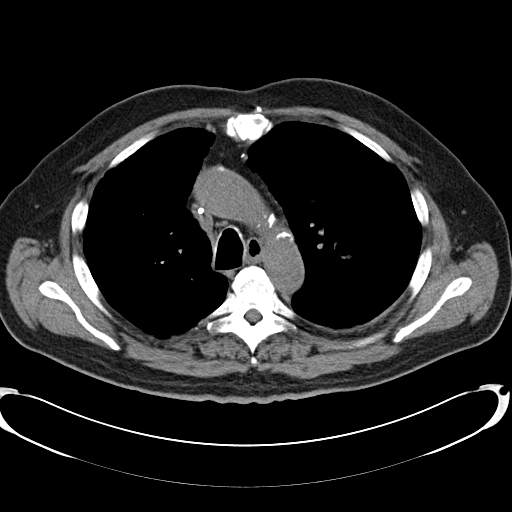
[im 47/63  soft-tissue]
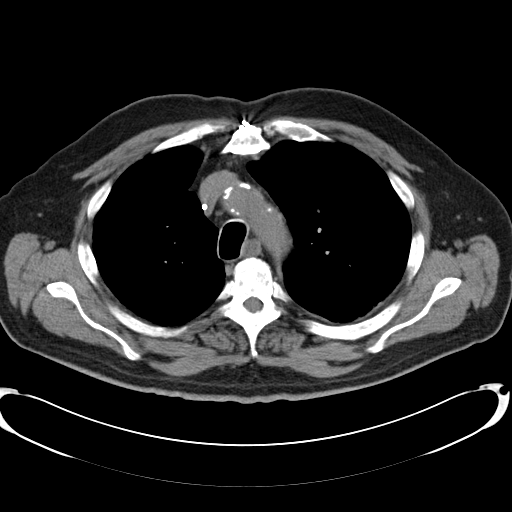
[im 51/63  soft-tissue]
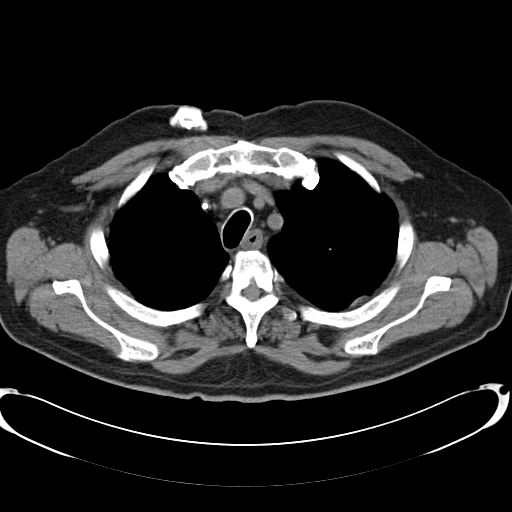
[im 55/63  soft-tissue]
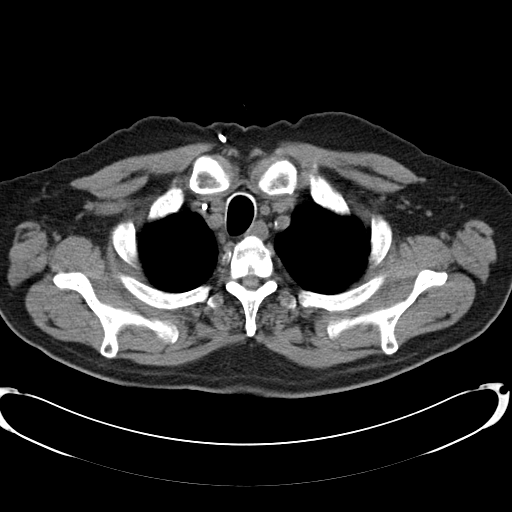
[im 59/63  soft-tissue]
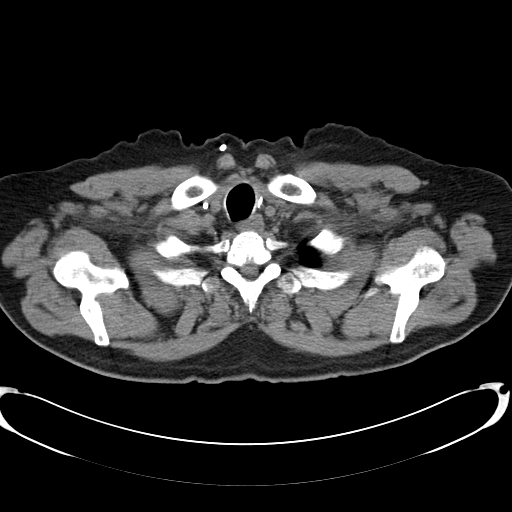

[Series 5: routine chest wo cor · coronal · 0.63mm/px · 3 of 139 slices shown]
[im 47/139  soft-tissue]
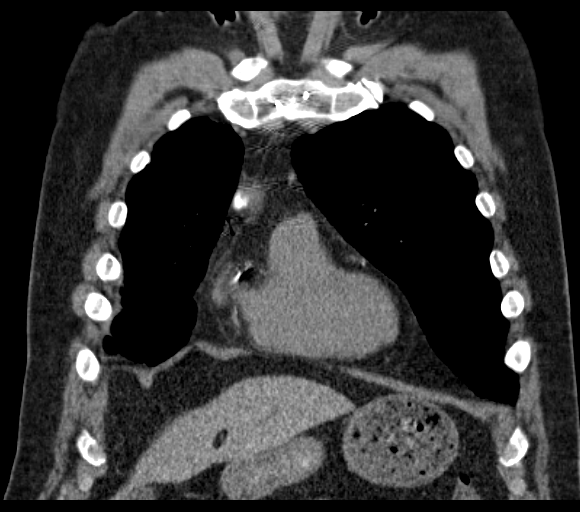
[im 62/139  soft-tissue]
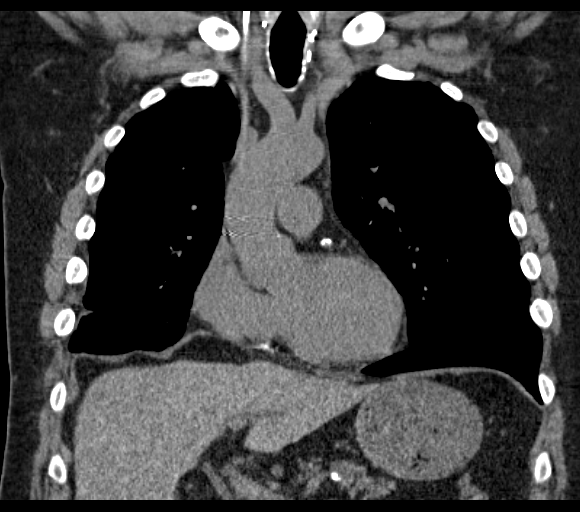
[im 77/139  soft-tissue]
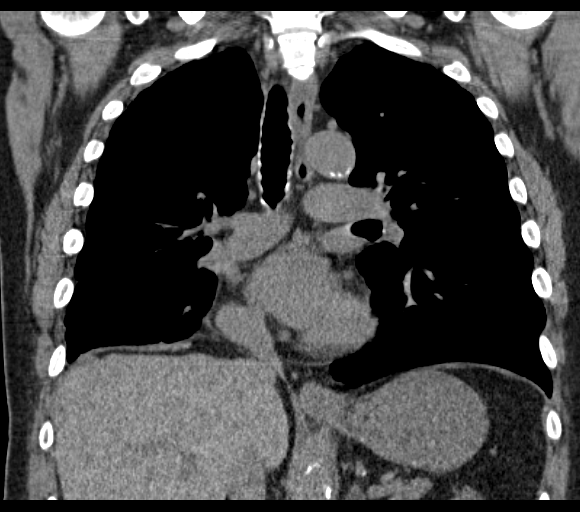

[17 of 46 positions shown; findings below may reference images not displayed]

FINDINGS: Mediastinum: There are no enlarged mediastinal, hilar or axillary
lymph nodes.

Right IJ Port-A-Cath extends the mid SVC. The thyroid gland has been
previously resected. The trachea appears normal. There is stable mid
esophageal wall thickening. The heart size is normal. There is
atherosclerosis of the aorta, great vessels and coronary arteries.

Lungs/Pleura: Posterior inferior fibrothorax appears stable. There
is no significant pleural or pericardial effusion. There is stable
volume loss in the right hemithorax status post right lower lobe
resection. Mild atelectasis or scarring in the right middle lobe and
left lower lobe is stable. There is also stable subpleural scarring
posteriorly in the right upper lobe. Underlying emphysematous
changes are again noted.

Upper abdomen: Small calcified gallstones and a small left adrenal
adenoma are stable.

Musculoskeletal/Chest wall: No worrisome osseous findings.
Degenerative changes throughout the spine are noted.
IMPRESSION: 1. Stable postoperative chest CT. No evidence of local recurrence or
metastatic disease.
2. Right fibrothorax, bibasilar scarring and emphysema again noted.
3. Stable left adrenal adenoma and cholelithiasis.

## 2014-01-11 ENCOUNTER — Ambulatory Visit (INDEPENDENT_AMBULATORY_CARE_PROVIDER_SITE_OTHER): Payer: 59 | Admitting: Internal Medicine

## 2014-01-11 ENCOUNTER — Encounter: Payer: Self-pay | Admitting: Internal Medicine

## 2014-01-11 VITALS — BP 130/88 | HR 68 | Temp 97.9°F | Resp 16 | Ht 69.5 in | Wt 187.5 lb

## 2014-01-11 DIAGNOSIS — I10 Essential (primary) hypertension: Secondary | ICD-10-CM

## 2014-01-11 DIAGNOSIS — R059 Cough, unspecified: Secondary | ICD-10-CM

## 2014-01-11 DIAGNOSIS — E1165 Type 2 diabetes mellitus with hyperglycemia: Principal | ICD-10-CM

## 2014-01-11 DIAGNOSIS — E785 Hyperlipidemia, unspecified: Secondary | ICD-10-CM

## 2014-01-11 DIAGNOSIS — C73 Malignant neoplasm of thyroid gland: Secondary | ICD-10-CM

## 2014-01-11 DIAGNOSIS — E1129 Type 2 diabetes mellitus with other diabetic kidney complication: Secondary | ICD-10-CM

## 2014-01-11 DIAGNOSIS — R05 Cough: Secondary | ICD-10-CM

## 2014-01-11 MED ORDER — GLIPIZIDE ER 2.5 MG PO TB24: 2.5000 mg | ORAL_TABLET | Freq: Every day | ORAL | Status: DC

## 2014-01-11 MED ORDER — GABAPENTIN 100 MG PO CAPS: 100.0000 mg | ORAL_CAPSULE | Freq: Every day | ORAL | Status: DC

## 2014-01-11 NOTE — Progress Notes (Signed)
[name], male   DOB: [birth year], 68 y.o.   MRN: [id number]  Patient Active Problem List   Diagnosis Date Noted  . Cough 01/12/2014  . Anxiety state, unspecified 10/27/2013  . Exertional dyspnea 07/16/2013  . CKD (chronic kidney disease) stage 3, GFR 30-59 ml/min 07/16/2013  . 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 Thyroid cancer   . Squamous cell lung cancer   . CAD (coronary artery disease), autologous vein bypass graft 07/12/2011  . Personal history of pneumonia 07/12/2011  . Type II or unspecified type diabetes mellitus with renal manifestations, not stated as uncontrolled(250.40) 07/12/2011  . Hyperlipidemia   . Hypertension   . Polyp of colon, adenomatous 04/08/2011    Subjective:  CC:   Chief Complaint  Patient presents with  . Follow-up    3 month  . Diabetes    HPI:   Nathaniel Lane is a 68 y.o. male who presents for   3 month follow up on DM.  He reports persistent foot pain that is not typical for neuropathy, but described as tightness over the metatarsal heads.  He is  waking up with foot pain   Has been having a dry cough ,  Dr Gabriel Carina prescribed ranitidine last week for GERD.Denies dyspnea.   He is not checking blood sugars.  Diet is problematic due to regular inclusion of potatoes and cornbread.   He is travelling with wife and brother/sister in law to see his other brother in [place] next week. They are driving.     Past Medical History  Diagnosis Date  . Diabetes mellitus   . Hyperlipidemia   . Hypertension   . Myocardial infarction   . Cancer 2012    Stage IIA squamous cell Lung Ca  . Thyroid cancer 2013    s/p thyroidectomy 2014    Past Surgical History  Procedure Laterality Date  . Coronary artery bypass graft  2007  . Lung lobectomy         The following portions of the patient's history were reviewed and updated as appropriate: Allergies, current medications, and problem list.    Review of Systems:   Patient  denies headache, fevers, malaise, unintentional weight loss, skin rash, eye pain, sinus congestion and sinus pain, sore throat, dysphagia,  hemoptysis , cough, dyspnea, wheezing, chest pain, palpitations, orthopnea, edema, abdominal pain, nausea, melena, diarrhea, constipation, flank pain, dysuria, hematuria, urinary  Frequency, nocturia, numbness, tingling, seizures,  Focal weakness, Loss of consciousness,  Tremor, insomnia, depression, anxiety, and suicidal ideation.     History   Social History  . Marital Status: Married    Spouse Name: N/A    Number of Children: N/A  . 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 Smoking status: Former Smoker    Quit date: 07/15/2005  . Smokeless tobacco: Never Used  . Alcohol Use: No  . Drug Use: No  . Sexual Activity: Not on file   Other Topics Concern  . Not on file   Social History Narrative  . No narrative on file    Objective:  Filed Vitals:   01/11/14 1815  BP: 130/88  Pulse: 68  Temp: 97.9 F (36.6 C)  Resp: 16     General appearance: alert, cooperative and appears stated age Ears: normal TM's and external ear canals both ears Throat: lips, mucosa, and tongue normal; teeth and gums normal Neck: no adenopathy, no carotid bruit,  supple, symmetrical, trachea midline and thyroid not enlarged, symmetric, no tenderness/mass/nodules Back: symmetric, no curvature. ROM normal. No CVA tenderness. Lungs: clear to auscultation bilaterally Heart: regular rate and rhythm, S1, S2 normal, no murmur, click, rub or gallop Abdomen: soft, non-tender; bowel sounds normal; no masses,  no organomegaly Pulses: 2+ and symmetric Skin: Skin color, texture, turgor normal. No rashes or lesions Lymph nodes: Cervical, supraclavicular, and axillary nodes normal.  Assessment and Plan:  Hypertension Well controlled on current regimen. Renal function stable, no changes today.  Lab Results   Component Value Date   CREATININE 1.9* 10/30/2013       Type II or unspecified type diabetes mellitus with renal manifestations, not stated as uncontrolled(250.40) Adding glipizide ER 2.5 mg for persistent hyperglycemia. He is taking and ACE I and  statin/asa   Lab Results  Component Value Date   HGBA1C 7.4* 10/30/2013   Lab Results  Component Value Date   MICROALBUR 7.8* 10/30/2013   Lab Results  Component Value Date   CHOL 162 10/30/2013   HDL 31.70* 10/30/2013   LDLCALC 88 10/30/2013   LDLDIRECT 178.5 03/13/2013   TRIG 211.0* 10/30/2013   CHOLHDL 5 10/30/2013      Hyperlipidemia Untreated during  cancer therapy.  Statin resumed for LDL 178 and history of CAD. Repeat labs due in August     Cough Discussed the possible etiologies of persistent cough including but not limited to post nasal drip,  Allergic rhinitis,  Asthma,  GERD , chronic cyclic cough due to persistent irritation, ACE  Inhibitor, and finbally recurrenc of CA. He has regular follow up with Oncology.  contineu GERD treatment,  Consider stopping enalapril if persistent.      Updated Medication List Outpatient Encounter Prescriptions as of 01/11/2014  Medication Sig  . atorvastatin (LIPITOR) 20 MG tablet Take 1 tablet (20 mg total) by mouth daily.  Marland Kitchen BAYER CONTOUR TEST test strip USE AS DIRECTED  . Calcium Acetate 667 MG TABS Take 2 capsules by mouth 2 (two) times daily.  . chlorpheniramine-HYDROcodone (TUSSIONEX) 10-8 MG/5ML LQCR Take 5 mLs by mouth every 12 (twelve) hours as needed.  . [facility] ASPIRIN LOW DOSE 81 MG EC tablet TAKE 1 TABLET (81 MG TOTAL) BY MOUTH DAILY. SWALLOW WHOLE.  . enalapril (VASOTEC) 10 MG tablet TAKE 1 TABLET BY MOUTH EVERY DAY  . levothyroxine (SYNTHROID, LEVOTHROID) 150 MCG tablet Take 150 mcg by mouth daily before breakfast.  . ranitidine (ZANTAC) 150 MG capsule Take 150 mg by mouth every evening.  . tamsulosin (FLOMAX) 0.4 MG CAPS capsule TAKE ONE CAPSULE BY MOUTH EVERY DAY  . gabapentin  (NEURONTIN) 100 MG capsule Take 1 capsule (100 mg total) by mouth at bedtime. Increase dose  weekly by 100 mg  . glipiZIDE (GLUCOTROL XL) 2.5 MG 24 hr tablet Take 1 tablet (2.5 mg total) by mouth daily with breakfast.  . [DISCONTINUED] levothyroxine (SYNTHROID, LEVOTHROID) 137 MCG tablet Take 1 tablet (137 mcg total) by mouth daily before breakfast.  . [DISCONTINUED] tamsulosin (FLOMAX) 0.4 MG CAPS capsule TAKE ONE CAPSULE BY MOUTH EVERY DAY     Orders Placed This Encounter  Procedures  . Comprehensive metabolic panel  . Hemoglobin A1c  . Lipid panel  . Microalbumin / creatinine urine ratio    Return for follow up diabetes.

## 2014-01-11 NOTE — Patient Instructions (Signed)
I am starting you on two new medications for your diabetes:  Gabapentin  100 mg at bedtime for your foot pain.. You may increase it every week by 100 mg if needed,  Up to 300 mg  At bedtime   Glipizide ER  One tablet daily with breakfast .  To lower your blood sugars.   DO NOT skip meals   When you return from [place],  [place] will be due for fasting labs in mid to late August   HAVE [place]!!!!!

## 2014-01-11 NOTE — Progress Notes (Signed)
Pre-visit discussion using our clinic review tool. No additional management support is needed unless otherwise documented below in the visit note.  

## 2014-01-12 ENCOUNTER — Encounter: Payer: Self-pay | Admitting: Internal Medicine

## 2014-01-12 DIAGNOSIS — R059 Cough, unspecified: Secondary | ICD-10-CM | POA: Insufficient documentation

## 2014-01-12 DIAGNOSIS — R05 Cough: Secondary | ICD-10-CM | POA: Insufficient documentation

## 2014-01-12 DIAGNOSIS — R051 Acute cough: Secondary | ICD-10-CM | POA: Insufficient documentation

## 2014-01-12 NOTE — Assessment & Plan Note (Signed)
Untreated during  cancer therapy.  Statin resumed for LDL 178 and history of CAD. Repeat labs due in August

## 2014-01-12 NOTE — Assessment & Plan Note (Signed)
Adding glipizide ER 2.5 mg for persistent hyperglycemia. He is taking and ACE I and  statin/asa   Lab Results  Component Value Date   HGBA1C 7.4* 10/30/2013   Lab Results  Component Value Date   MICROALBUR 7.8* 10/30/2013   Lab Results  Component Value Date   CHOL 162 10/30/2013   HDL 31.70* 10/30/2013   LDLCALC 88 10/30/2013   LDLDIRECT 178.5 03/13/2013   TRIG 211.0* 10/30/2013   CHOLHDL 5 10/30/2013

## 2014-01-12 NOTE — Assessment & Plan Note (Signed)
Well controlled on current regimen. Renal function stable, no changes today.  Lab Results  Component Value Date   CREATININE 1.9* 10/30/2013

## 2014-01-12 NOTE — Assessment & Plan Note (Signed)
Discussed the possible etiologies of persistent cough including but not limited to post nasal drip,  Allergic rhinitis,  Asthma,  GERD , chronic cyclic cough due to persistent irritation, ACE  Inhibitor, and finbally recurrenc of CA. He has regular follow up with Oncology.  contineu GERD treatment,  Consider stopping enalapril if persistent.

## 2014-01-13 ENCOUNTER — Ambulatory Visit: Payer: Self-pay | Admitting: Oncology

## 2014-01-15 ENCOUNTER — Ambulatory Visit: Payer: 59 | Admitting: Internal Medicine

## 2014-02-01 ENCOUNTER — Other Ambulatory Visit: Payer: Self-pay | Admitting: Internal Medicine

## 2014-02-23 ENCOUNTER — Other Ambulatory Visit (INDEPENDENT_AMBULATORY_CARE_PROVIDER_SITE_OTHER): Payer: Medicare Other

## 2014-02-23 DIAGNOSIS — Z85850 Personal history of malignant neoplasm of thyroid: Secondary | ICD-10-CM

## 2014-02-23 DIAGNOSIS — E1165 Type 2 diabetes mellitus with hyperglycemia: Principal | ICD-10-CM

## 2014-02-23 DIAGNOSIS — R7989 Other specified abnormal findings of blood chemistry: Secondary | ICD-10-CM

## 2014-02-23 DIAGNOSIS — E1129 Type 2 diabetes mellitus with other diabetic kidney complication: Secondary | ICD-10-CM

## 2014-02-23 LAB — COMPREHENSIVE METABOLIC PANEL
ALK PHOS: 100 U/L (ref 39–117)
ALT: 33 U/L (ref 0–53)
AST: 25 U/L (ref 0–37)
Albumin: 3.7 g/dL (ref 3.5–5.2)
BUN: 26 mg/dL — ABNORMAL HIGH (ref 6–23)
CO2: 26 mEq/L (ref 19–32)
Calcium: 9.4 mg/dL (ref 8.4–10.5)
Chloride: 105 mEq/L (ref 96–112)
Creatinine, Ser: 1.8 mg/dL — ABNORMAL HIGH (ref 0.4–1.5)
GFR: 41.35 mL/min — ABNORMAL LOW (ref >60.00)
Glucose, Bld: 150 mg/dL — ABNORMAL HIGH (ref 70–99)
Potassium: 4.9 mEq/L (ref 3.5–5.1)
SODIUM: 137 meq/L (ref 135–145)
TOTAL PROTEIN: 7.3 g/dL (ref 6.0–8.3)
Total Bilirubin: 0.5 mg/dL (ref 0.2–1.2)

## 2014-02-23 LAB — LIPID PANEL
Cholesterol: 145 mg/dL (ref 0–200)
HDL: 26.5 mg/dL — AB (ref >39.00)
NonHDL: 118.5
Total CHOL/HDL Ratio: 5
Triglycerides: 249 mg/dL — ABNORMAL HIGH (ref 0.0–149.0)
VLDL: 49.8 mg/dL — ABNORMAL HIGH (ref 0.0–40.0)

## 2014-02-23 LAB — HEMOGLOBIN A1C: HEMOGLOBIN A1C: 7.5 % — AB (ref 4.6–6.5)

## 2014-02-23 LAB — MICROALBUMIN / CREATININE URINE RATIO
CREATININE, U: 112.4 mg/dL
MICROALB UR: 3.4 mg/dL — AB (ref 0.0–1.9)
Microalb Creat Ratio: 3 mg/g (ref 0.0–30.0)

## 2014-02-23 LAB — LDL CHOLESTEROL, DIRECT: Direct LDL: 84.2 mg/dL

## 2014-02-26 NOTE — Addendum Note (Signed)
Addended by: Crecencio Mc on: 02/26/2014 06:36 AM   Modules accepted: Orders

## 2014-03-04 ENCOUNTER — Other Ambulatory Visit: Payer: Self-pay | Admitting: Internal Medicine

## 2014-03-04 MED ORDER — BLOOD GLUCOSE MONITORING SUPPL W/DEVICE KIT: PACK | Status: DC

## 2014-03-04 MED ORDER — LANCETS MISC: Status: DC

## 2014-03-04 MED ORDER — BLOOD GLUC METER DISP-STRIPS DEVI: Status: DC

## 2014-03-08 ENCOUNTER — Ambulatory Visit: Payer: Self-pay

## 2014-03-09 ENCOUNTER — Other Ambulatory Visit: Payer: Self-pay | Admitting: Internal Medicine

## 2014-03-12 IMAGING — NM NM THRYOGEN WHOLE BODY IMAGING SCAN 2
2 series · 6 of 6 positions shown · non-contrast
Comparison: I 131 scan [DATE]

CLINICAL DATA: Papillary thyroid carcinoma with lymph node
mCi on [DATE].

EXAM:
THYROGEN-STIMULATED [3S] WHOLE BODY SCAN
TECHNIQUE: The patient received 0.9 mg Thyrogen intramuscularly every 24 hours
for two doses. On the third day the patient returned and received
the radiopharmaceutical, per orally. On the fifth day, the patient
returned and whole body planar images were obtained in the anterior
and posterior projections.
RADIOPHARMACEUTICALS:  4.0 [3S] sodium iodide

[Series 1000: (id) wb 48 hr · 2.40mm/px · 2 of 2 frames shown]
[frame 1/2  full-range]
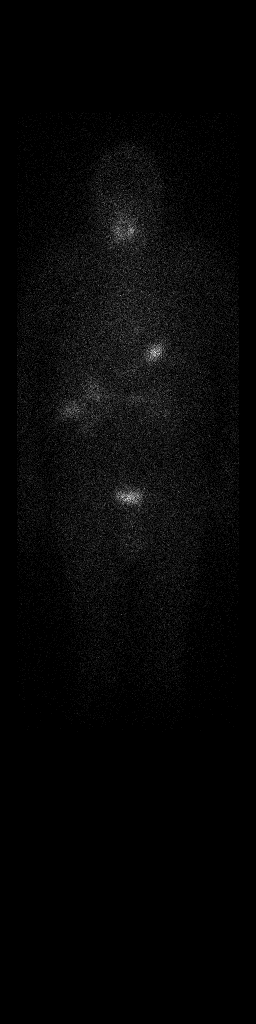
[frame 2/2  full-range]
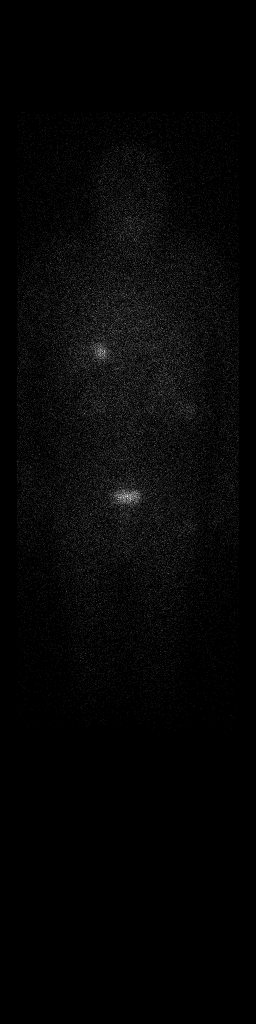

[Series 1000: (id) statics 48 hour · 2.40mm/px · 2 acquisitions, 4 frames shown]
[im 1/2  full-range]
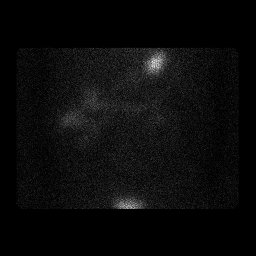
[im 1/2  full-range]
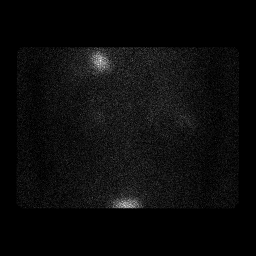
[im 2/2  full-range]
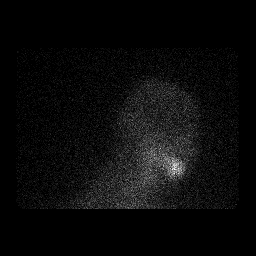
[im 2/2  full-range]
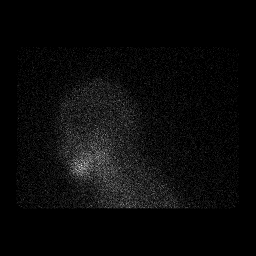

[6 of 6 positions shown; findings below may reference images not displayed]

FINDINGS: No abnormal uptake within the thyroid bed. No abnormal uptake in the
whole-body scan. Physiologic uptake noted in the salivary glands, GI
and GU tract.
IMPRESSION: No evidence of local thyroid cancer recurrence or distant
metastasis. Recommend correlation with thyroglobulin levels.

## 2014-03-23 ENCOUNTER — Other Ambulatory Visit: Payer: Self-pay | Admitting: *Deleted

## 2014-03-23 MED ORDER — TAMSULOSIN HCL 0.4 MG PO CAPS: ORAL_CAPSULE | ORAL | Status: DC

## 2014-03-23 MED ORDER — GABAPENTIN 100 MG PO CAPS: 100.0000 mg | ORAL_CAPSULE | Freq: Every day | ORAL | Status: DC

## 2014-04-13 ENCOUNTER — Ambulatory Visit (INDEPENDENT_AMBULATORY_CARE_PROVIDER_SITE_OTHER): Payer: Medicare Other | Admitting: Internal Medicine

## 2014-04-13 ENCOUNTER — Encounter: Payer: Self-pay | Admitting: Internal Medicine

## 2014-04-13 VITALS — BP 106/78 | HR 80 | Temp 97.5°F | Resp 16 | Ht 69.5 in | Wt 184.8 lb

## 2014-04-13 DIAGNOSIS — I10 Essential (primary) hypertension: Secondary | ICD-10-CM

## 2014-04-13 DIAGNOSIS — E1165 Type 2 diabetes mellitus with hyperglycemia: Secondary | ICD-10-CM

## 2014-04-13 DIAGNOSIS — E1129 Type 2 diabetes mellitus with other diabetic kidney complication: Secondary | ICD-10-CM

## 2014-04-13 DIAGNOSIS — C73 Malignant neoplasm of thyroid gland: Secondary | ICD-10-CM

## 2014-04-13 DIAGNOSIS — IMO0002 Reserved for concepts with insufficient information to code with codable children: Secondary | ICD-10-CM

## 2014-04-13 LAB — HM DIABETES FOOT EXAM: HM DIABETIC FOOT EXAM: ABNORMAL

## 2014-04-13 MED ORDER — GLIPIZIDE ER 2.5 MG PO TB24: 2.5000 mg | ORAL_TABLET | Freq: Every day | ORAL | Status: DC

## 2014-04-13 NOTE — Patient Instructions (Addendum)
I am starting your on glipizide ER,  Take it once daily.  Check your blood sugars to once daily at different times   Drop off your blood sugars in a month so I can review,

## 2014-04-13 NOTE — Progress Notes (Signed)
[name], male   DOB: March 27, 1946, 68 y.o.   MRN: [id number]     Patient Active Problem List   Diagnosis Date Noted  . Cough 01/12/2014  . Anxiety state, unspecified 10/27/2013  . Exertional dyspnea 07/16/2013  . CKD (chronic kidney disease) stage 3, GFR 30-59 ml/min 07/16/2013  . 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 Thyroid cancer   . Squamous cell lung cancer   . CAD (coronary artery disease), autologous vein bypass graft 07/12/2011  . Personal history of pneumonia 07/12/2011  . DM type 2, uncontrolled, with renal complications 76/54/6503  . Hyperlipidemia   . Hypertension   . Polyp of colon, adenomatous 04/08/2011    Subjective:  CC:   Chief Complaint  Patient presents with  . Follow-up    3 month folow up  . Diabetes    HPI:   Nathaniel Lane is a 68 y.o. male who presents for  Follow up on type 2 DM with CKD.  He has been vacationing out [place] for several weeks and did not check his blood sugars during his trip.     Saw  His oncologist yesterday for follow up on thyroid ca  And was told to Return in one year    Past Medical History  Diagnosis Date  . Diabetes mellitus   . Hyperlipidemia   . Hypertension   . Myocardial infarction   . Cancer 2012    Stage IIA squamous cell Lung Ca  . Thyroid cancer 2013    s/p thyroidectomy 2014    Past Surgical History  Procedure Laterality Date  . Coronary artery bypass graft  2007  . Lung lobectomy         The following portions of the patient's history were reviewed and updated as appropriate: Allergies, current medications, and problem list.    Review of Systems:   Patient denies headache, fevers, malaise, unintentional weight loss, skin rash, eye pain, sinus congestion and sinus pain, sore throat, dysphagia,  hemoptysis , cough, dyspnea, wheezing, chest pain, palpitations, orthopnea, edema, abdominal pain, nausea, melena, diarrhea, constipation, flank pain, dysuria, hematuria, urinary   Frequency, nocturia, numbness, tingling, seizures,  Focal weakness, Loss of consciousness,  Tremor, insomnia, depression, anxiety, and suicidal ideation.     History   Social History  . Marital Status: Married    Spouse Name: N/A    Number of Children: N/A  . 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 Smoking status: Former Smoker    Quit date: 07/15/2005  . Smokeless tobacco: Never Used  . Alcohol Use: No  . Drug Use: No  . Sexual Activity: Not on file   Other Topics Concern  . Not on file   Social History Narrative  . No narrative on file    Objective:  Filed Vitals:   04/13/14 1529  BP: 106/78  Pulse: 80  Temp: 97.5 F (36.4 C)  Resp: 16     General appearance: alert, cooperative and appears stated age Ears: normal TM's and external ear canals both ears Throat: lips, mucosa, and tongue normal; teeth and gums normal Neck: no adenopathy, no carotid bruit, supple, symmetrical, trachea midline and thyroid not enlarged, symmetric, no tenderness/mass/nodules Back: symmetric, no curvature. ROM normal. No CVA tenderness. Lungs: clear to auscultation bilaterally Heart: regular rate and rhythm, S1, S2 normal, no murmur, click, rub or gallop Abdomen: soft, non-tender; bowel sounds normal; no masses,  no  organomegaly Pulses: 2+ and symmetric Skin: Skin color, texture, turgor normal. No rashes or lesions Lymph nodes: Cervical, supraclavicular, and axillary nodes normal.  Assessment and Plan:  Hypertension Well controlled on current regimen. Renal function stable, no changes today.  Lab Results  Component Value Date   CREATININE 1.8* 02/23/2014     Thyroid cancer No recurrence per oncology.  Repeat thyroid function is due.  Lab Results  Component Value Date   TSH 1.18 10/30/2013     DM type 2, uncontrolled, with renal complications Adding glipizide ER 2.5 mg for persistent hyperglycemia. He is taking and  ACE I and  statin/asa   Lab Results  Component Value Date   HGBA1C 7.5* 02/23/2014   Lab Results  Component Value Date   MICROALBUR 3.4* 02/23/2014   Lab Results  Component Value Date   CHOL 145 02/23/2014   HDL 26.50* 02/23/2014   LDLCALC 88 10/30/2013   LDLDIRECT 84.2 02/23/2014   TRIG 249.0* 02/23/2014   CHOLHDL 5 02/23/2014         Updated Medication List Outpatient Encounter Prescriptions as of 04/13/2014  Medication Sig  . atorvastatin (LIPITOR) 20 MG tablet Take 1 tablet (20 mg total) by mouth daily.  Marland Kitchen BAYER CONTOUR TEST test strip USE AS DIRECTED  . Blood Gluc Meter Disp-Strips (BLOOD GLUCOSE METER DISPOSABLE) DEVI Dose, route does not apply; to be used to check blood glucose 2-3 times daily  . Blood Glucose Monitoring Suppl W/DEVICE KIT Dose, route does not apply  . [facility] ASPIRIN LOW DOSE 81 MG EC tablet TAKE 1 TABLET (81 MG TOTAL) BY MOUTH DAILY. SWALLOW WHOLE.  . enalapril (VASOTEC) 10 MG tablet TAKE 1 TABLET BY MOUTH EVERY DAY  . gabapentin (NEURONTIN) 100 MG capsule Take 1 capsule (100 mg total) by mouth at bedtime. Increase dose  weekly by 100 mg  . Lancets MISC Dose, route does not apply; to be used to check blood glucose 2-3 times daily  . levothyroxine (SYNTHROID, LEVOTHROID) 150 MCG tablet Take 150 mcg by mouth daily before breakfast.  . ranitidine (ZANTAC) 150 MG capsule Take 150 mg by mouth every evening.  . tamsulosin (FLOMAX) 0.4 MG CAPS capsule TAKE ONE CAPSULE BY MOUTH EVERY DAY  . glipiZIDE (GLUCOTROL XL) 2.5 MG 24 hr tablet Take 1 tablet (2.5 mg total) by mouth daily with breakfast.  . [DISCONTINUED] Calcium Acetate 667 MG TABS Take 2 capsules by mouth 2 (two) times daily.  . [DISCONTINUED] chlorpheniramine-HYDROcodone (TUSSIONEX) 10-8 MG/5ML LQCR Take 5 mLs by mouth every 12 (twelve) hours as needed.  . [DISCONTINUED] enalapril (VASOTEC) 10 MG tablet TAKE 1 TABLET BY MOUTH EVERY DAY  . [DISCONTINUED] glipiZIDE (GLUCOTROL XL) 2.5 MG 24 hr tablet Take 1 tablet (2.5  mg total) by mouth daily with breakfast.     No orders of the defined types were placed in this encounter.    Return for follow up diabetes.

## 2014-04-13 NOTE — Progress Notes (Signed)
Pre-visit discussion using our clinic review tool. No additional management support is needed unless otherwise documented below in the visit note.  

## 2014-04-14 NOTE — Assessment & Plan Note (Signed)
Adding glipizide ER 2.5 mg for persistent hyperglycemia. He is taking and ACE I and  statin/asa   Lab Results  Component Value Date   HGBA1C 7.5* 02/23/2014   Lab Results  Component Value Date   MICROALBUR 3.4* 02/23/2014   Lab Results  Component Value Date   CHOL 145 02/23/2014   HDL 26.50* 02/23/2014   LDLCALC 88 10/30/2013   LDLDIRECT 84.2 02/23/2014   TRIG 249.0* 02/23/2014   CHOLHDL 5 02/23/2014

## 2014-04-14 NOTE — Assessment & Plan Note (Signed)
No recurrence per oncology.  Repeat thyroid function is due.  Lab Results  Component Value Date   TSH 1.18 10/30/2013

## 2014-04-14 NOTE — Assessment & Plan Note (Signed)
Well controlled on current regimen. Renal function stable, no changes today.  Lab Results  Component Value Date   CREATININE 1.8* 02/23/2014

## 2014-05-12 ENCOUNTER — Telehealth: Payer: Self-pay | Admitting: Internal Medicine

## 2014-05-12 NOTE — Telephone Encounter (Signed)
Pt dropped of blood sugar readings. List in Dr. Lupita Dawn box/msn

## 2014-05-12 NOTE — Telephone Encounter (Signed)
In yellow folder

## 2014-05-17 MED ORDER — GLIPIZIDE ER 5 MG PO TB24: 2.5000 mg | ORAL_TABLET | Freq: Every day | ORAL | Status: DC

## 2014-05-17 NOTE — Telephone Encounter (Signed)
Pt notified and verbalized understanding.

## 2014-05-17 NOTE — Telephone Encounter (Signed)
Blood sugars need attention.  Ask him to increase is glipizide to 5 mg  Once daily .  New rx sent to [facility]

## 2014-06-21 ENCOUNTER — Telehealth: Payer: Self-pay | Admitting: Internal Medicine

## 2014-06-21 DIAGNOSIS — E119 Type 2 diabetes mellitus without complications: Secondary | ICD-10-CM

## 2014-06-21 NOTE — Telephone Encounter (Signed)
Your diabetes  Control  Has improved based on the log of blood sugars you dropped off,  and your A1c is due for repeating.  Please make appt for fasting labs in january and follow up with me afterward Lab Results  Component Value Date   HGBA1C 7.5* 02/23/2014

## 2014-06-22 ENCOUNTER — Encounter: Payer: Self-pay | Admitting: *Deleted

## 2014-06-22 NOTE — Telephone Encounter (Signed)
Letter mailed

## 2014-06-28 ENCOUNTER — Other Ambulatory Visit: Payer: Self-pay | Admitting: *Deleted

## 2014-06-28 DIAGNOSIS — E785 Hyperlipidemia, unspecified: Secondary | ICD-10-CM

## 2014-06-28 MED ORDER — ATORVASTATIN CALCIUM 20 MG PO TABS: 20.0000 mg | ORAL_TABLET | Freq: Every day | ORAL | Status: DC

## 2014-07-02 ENCOUNTER — Ambulatory Visit: Payer: Medicare Other | Admitting: Internal Medicine

## 2014-07-19 ENCOUNTER — Ambulatory Visit: Payer: Self-pay | Admitting: Internal Medicine

## 2014-07-21 ENCOUNTER — Ambulatory Visit: Payer: Self-pay | Admitting: Oncology

## 2014-07-21 ENCOUNTER — Ambulatory Visit (INDEPENDENT_AMBULATORY_CARE_PROVIDER_SITE_OTHER): Payer: Medicare Other | Admitting: Internal Medicine

## 2014-07-21 ENCOUNTER — Encounter: Payer: Self-pay | Admitting: Internal Medicine

## 2014-07-21 VITALS — BP 112/68 | HR 66 | Resp 14 | Ht 69.5 in | Wt 187.2 lb

## 2014-07-21 DIAGNOSIS — E890 Postprocedural hypothyroidism: Secondary | ICD-10-CM

## 2014-07-21 DIAGNOSIS — R0609 Other forms of dyspnea: Secondary | ICD-10-CM

## 2014-07-21 DIAGNOSIS — E1129 Type 2 diabetes mellitus with other diabetic kidney complication: Secondary | ICD-10-CM

## 2014-07-21 DIAGNOSIS — C3491 Malignant neoplasm of unspecified part of right bronchus or lung: Secondary | ICD-10-CM

## 2014-07-21 DIAGNOSIS — IMO0002 Reserved for concepts with insufficient information to code with codable children: Secondary | ICD-10-CM

## 2014-07-21 DIAGNOSIS — C73 Malignant neoplasm of thyroid gland: Secondary | ICD-10-CM

## 2014-07-21 DIAGNOSIS — E1165 Type 2 diabetes mellitus with hyperglycemia: Secondary | ICD-10-CM

## 2014-07-21 DIAGNOSIS — N183 Chronic kidney disease, stage 3 unspecified: Secondary | ICD-10-CM

## 2014-07-21 DIAGNOSIS — E1121 Type 2 diabetes mellitus with diabetic nephropathy: Secondary | ICD-10-CM

## 2014-07-21 DIAGNOSIS — Z85850 Personal history of malignant neoplasm of thyroid: Secondary | ICD-10-CM

## 2014-07-21 DIAGNOSIS — J449 Chronic obstructive pulmonary disease, unspecified: Secondary | ICD-10-CM

## 2014-07-21 DIAGNOSIS — I25810 Atherosclerosis of coronary artery bypass graft(s) without angina pectoris: Secondary | ICD-10-CM

## 2014-07-21 DIAGNOSIS — E119 Type 2 diabetes mellitus without complications: Secondary | ICD-10-CM

## 2014-07-21 LAB — COMPREHENSIVE METABOLIC PANEL
ALT: 26 U/L (ref 0–53)
AST: 20 U/L (ref 0–37)
Albumin: 3.9 g/dL (ref 3.5–5.2)
Alkaline Phosphatase: 110 U/L (ref 39–117)
BILIRUBIN TOTAL: 0.4 mg/dL (ref 0.2–1.2)
BUN: 29 mg/dL — AB (ref 6–23)
CALCIUM: 9 mg/dL (ref 8.4–10.5)
CO2: 22 mEq/L (ref 19–32)
Chloride: 107 mEq/L (ref 96–112)
Creatinine, Ser: 1.79 mg/dL — ABNORMAL HIGH (ref 0.40–1.50)
GFR: 40.51 mL/min — ABNORMAL LOW (ref >60.00)
Glucose, Bld: 65 mg/dL — ABNORMAL LOW (ref 70–99)
Potassium: 4.7 mEq/L (ref 3.5–5.1)
SODIUM: 139 meq/L (ref 135–145)
Total Protein: 6.8 g/dL (ref 6.0–8.3)

## 2014-07-21 LAB — LIPID PANEL
Cholesterol: 136 mg/dL (ref 0–200)
HDL: 30.4 mg/dL — AB (ref >39.00)
NonHDL: 105.6
Total CHOL/HDL Ratio: 4
Triglycerides: 256 mg/dL — ABNORMAL HIGH (ref 0.0–149.0)
VLDL: 51.2 mg/dL — AB (ref 0.0–40.0)

## 2014-07-21 LAB — HEMOGLOBIN A1C: HEMOGLOBIN A1C: 6.9 % — AB (ref 4.6–6.5)

## 2014-07-21 LAB — TSH: TSH: 0.21 u[IU]/mL — ABNORMAL LOW (ref 0.35–4.50)

## 2014-07-21 LAB — LDL CHOLESTEROL, DIRECT: Direct LDL: 79 mg/dL

## 2014-07-21 NOTE — Progress Notes (Signed)
[name], male   DOB: 10/14/1945, 69 y.o.   MRN: [id number]   Patient Active Problem List   Diagnosis Date Noted  . Controlled type 2 diabetes mellitus with diabetic nephropathy 07/24/2014  . COPD, mild 07/24/2014  . Anxiety state, unspecified 10/27/2013  . Exertional dyspnea 07/16/2013  . CKD (chronic kidney disease) stage 3, GFR 30-59 ml/min 07/16/2013  . 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 Thyroid cancer   . Squamous cell lung cancer   . CAD (coronary artery disease), autologous vein bypass graft 07/12/2011  . Personal history of pneumonia 07/12/2011  . DM type 2, uncontrolled, with renal complications 33/58/2518  . Hyperlipidemia   . Hypertension   . Polyp of colon, adenomatous 04/08/2011    Subjective:  CC:   Chief Complaint  Patient presents with  . Follow-up    3 month follow up DM    HPI:    Nathaniel Lane is a 69 y.o. male who presents for Follow up on type 2  DM,  Hyperlipidemia,  Hypertension, thyroid and lung CA.  His last OV was 3 months ago. He  has gained 3  Lbs.   He is overweight.  His wife has dementia and continues to do the cooking.  She burns the meal once a week but makes the best cornbread "in the world," which he eats   4 or 5 times per  Week, with various meats including cubesteak ,  Chicken, and Pork chops. He typically eats out on Sundays,  And his Sunday meal includes Fried chicken, pplie pie, etc.  He is taking his medications as directed and has had no hypoglycemic events since starting glipizide at last visit.  Marland Kitchen  He is not exercising.  He has developed some tingling and burning of the 5th toe,  And his great toe feels numb. He is tolerating his statin wihtout myalgias and his ACE I without cough,   History of Stage IIa Squamous Cell Lung Cs/o right lobectomy and adjuvant chemo completed in July  2013:  He had a "perfect checkup"  from his oncologist Nathaniel Lane,  With repeat CT scan showing no recurrence.  Next follow up 6 months,   Then annually after that.  History of papillary CA: s/p total thyroidectomy followed by R 131 in July 2014:  Repeat scan Nov 20   was negative for residual thyroid tissue.  TSH is to be checked today,  With levothyroxine suppression therapy.   Past Medical History  Diagnosis Date  . Diabetes mellitus   . Hyperlipidemia   . Hypertension   . Myocardial infarction   . Cancer 2012    Stage IIA squamous cell Lung Ca  . Thyroid cancer 2013    s/p thyroidectomy 2014    Past Surgical History  Procedure Laterality Date  . Coronary artery bypass graft  2007  . Lung lobectomy    . Total thyroidectomy N/A        The following portions of the patient's history were reviewed and updated as appropriate: Allergies, current medications, and problem list.    Review of Systems:   Patient denies headache, fevers, malaise, unintentional weight loss, skin rash, eye pain, sinus congestion and sinus pain, sore throat, dysphagia,  hemoptysis , cough, dyspnea, wheezing, chest pain, palpitations, orthopnea, edema, abdominal pain, nausea, melena, diarrhea, constipation, flank pain, dysuria, hematuria, urinary  Frequency, nocturia, numbness, tingling, seizures,  Focal weakness, Loss of consciousness,  Tremor, insomnia, depression, anxiety, and suicidal ideation.  History   Social History  . Marital Status: Married    Spouse Name: N/A    Number of Children: N/A  . 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 Smoking status: Former Smoker    Quit date: 07/15/2005  . Smokeless tobacco: Never Used  . Alcohol Use: No  . Drug Use: No  . Sexual Activity: Not on file   Other Topics Concern  . Not on file   Social History Narrative    Objective:  Filed Vitals:   07/21/14 1046  BP: 112/68  Pulse: 66  Resp: 14     General appearance: alert, cooperative and appears stated age Ears: normal TM's and external ear canals both  ears Throat: lips, mucosa, and tongue normal; teeth and gums normal Neck: no adenopathy, no carotid bruit, supple, symmetrical, trachea midline and thyroid not enlarged, symmetric, no tenderness/mass/nodules Back: symmetric, no curvature. ROM normal. No CVA tenderness. Lungs: clear to auscultation bilaterally Heart: regular rate and rhythm, S1, S2 normal, no murmur, click, rub or gallop Abdomen: soft, non-tender; bowel sounds normal; no masses,  no organomegaly Pulses: 2+ and symmetric Skin: Skin color, texture, turgor normal. No rashes or lesions Lymph nodes: Cervical, supraclavicular, and axillary nodes normal.  Assessment and Plan:  Thyroid cancer He has had no recurrence per Endocrinology (Nathaniel Lane, Nathaniel Lane doing annual scans).  Repeat TSH is appropriately suppressed.   Lab Results  Component Value Date   TSH 0.21* 07/21/2014        CAD (coronary artery disease), autologous vein bypass graft He remains asymptomatic but is not exercising regularly,  Continue ASA, statin, ACE Inhibitor.  Not on beta blociker secondary to low resting heart rate.   Lab Results  Component Value Date   CHOL 136 07/21/2014   HDL 30.40* 07/21/2014   LDLCALC 88 10/30/2013   LDLDIRECT 79.0 07/21/2014   TRIG 256.0* 07/21/2014   CHOLHDL 4 07/21/2014      CKD (chronic kidney disease) stage 3, GFR 30-59 ml/min Renal function has bee stable on ACE INhibitor,  He is avoiding NSAIDs and nephrotoxic agents.   Lab Results  Component Value Date   CREATININE 1.79* 07/21/2014  . Lab Results  Component Value Date   MICROALBUR 3.4* 02/23/2014      DM type 2, uncontrolled, with renal complications Improved control with the addition of glipizide ER 2.5 mg in October for persistent hyperglycemia. He is taking an ACE I , high potency statin and ASA.  Neuropathy noted ,  Foot and eye exams are up to date.    Lab Results  Component Value Date   HGBA1C 6.9* 07/21/2014   Lab Results  Component  Value Date   MICROALBUR 3.4* 02/23/2014   Lab Results  Component Value Date   CHOL 136 07/21/2014   HDL 30.40* 07/21/2014   LDLCALC 88 10/30/2013   LDLDIRECT 79.0 07/21/2014   TRIG 256.0* 07/21/2014   CHOLHDL 4 07/21/2014     Postsurgical hypothyroidism TSH is suppressed as is standard for post thyroidectomy .  Continue current dose of levothyroxine.   Lab Results  Component Value Date   TSH 0.21* 07/21/2014      Exertional dyspnea He was evaluated by his cardiologist Dr Clayborn Bigness in July and no stress test or ECHO was  done.    COPD, mild He remains asymptomatic at rest but has some mild exertional dyspnea which was deemed noncardiac in July by [facility].  Controlled type 2 diabetes mellitus with diabetic nephropathy Foot exam normal except for decreased sensation.  Continue gabapentin qhs    Squamous cell lung cancer He finished adjuvant chemo in July 2013 and has had no recurrence of CA  by surveillance scans,  Done every 6 months , last one Jan 2016.  Managed by time [facility]   A total of 40 minutes was spent with patient more than half of which was spent in eviewing and explaining recent labs and imaging studies done, and coordination of care.  Updated Medication List Outpatient Encounter Prescriptions as of 07/21/2014  Medication Sig  . atorvastatin (LIPITOR) 20 MG tablet Take 1 tablet (20 mg total) by mouth daily.  Marland Kitchen BAYER CONTOUR TEST test strip USE AS DIRECTED  . Blood Gluc Meter Disp-Strips (BLOOD GLUCOSE METER DISPOSABLE) DEVI Dose, route does not apply; to be used to check blood glucose 2-3 times daily  . Blood Glucose Monitoring Suppl W/DEVICE KIT Dose, route does not apply  . [facility] ASPIRIN LOW DOSE 81 MG EC tablet TAKE 1 TABLET (81 MG TOTAL) BY MOUTH DAILY. SWALLOW WHOLE.  . enalapril (VASOTEC) 10 MG tablet TAKE 1 TABLET BY MOUTH EVERY DAY  . gabapentin (NEURONTIN) 100 MG capsule Take 1 capsule (100 mg total) by mouth at bedtime. Increase dose  weekly by  100 mg  . glipiZIDE (GLUCOTROL XL) 5 MG 24 hr tablet Take 1 tablet (5 mg total) by mouth daily with breakfast.  . Lancets MISC Dose, route does not apply; to be used to check blood glucose 2-3 times daily  . levothyroxine (SYNTHROID, LEVOTHROID) 150 MCG tablet Take 150 mcg by mouth daily before breakfast.  . ranitidine (ZANTAC) 150 MG capsule Take 150 mg by mouth every evening.  . tamsulosin (FLOMAX) 0.4 MG CAPS capsule TAKE ONE CAPSULE BY MOUTH EVERY DAY     Orders Placed This Encounter  Procedures  . LDL cholesterol, direct    No Follow-up on file.

## 2014-07-21 NOTE — Progress Notes (Signed)
Pre visit review using our clinic review tool, if applicable. No additional management support is needed unless otherwise documented below in the visit note. 

## 2014-07-21 NOTE — Patient Instructions (Signed)
Your blood sugars appear to be under excellent control.  I would like for you to start walking every day,  Starting with 15 minutes and working up to 30  See you in 3 months !!

## 2014-07-23 NOTE — Assessment & Plan Note (Addendum)
He has had no recurrence per Endocrinology (Melissa Solum, [place] doing annual scans).  Repeat TSH is appropriately suppressed.   Lab Results  Component Value Date   TSH 0.21* 07/21/2014

## 2014-07-24 ENCOUNTER — Encounter: Payer: Self-pay | Admitting: Internal Medicine

## 2014-07-24 DIAGNOSIS — J449 Chronic obstructive pulmonary disease, unspecified: Secondary | ICD-10-CM | POA: Insufficient documentation

## 2014-07-24 DIAGNOSIS — E119 Type 2 diabetes mellitus without complications: Secondary | ICD-10-CM | POA: Insufficient documentation

## 2014-07-24 NOTE — Assessment & Plan Note (Signed)
Renal function has bee stable on ACE INhibitor,  He is avoiding NSAIDs and nephrotoxic agents.   Lab Results  Component Value Date   CREATININE 1.79* 07/21/2014  . Lab Results  Component Value Date   MICROALBUR 3.4* 02/23/2014

## 2014-07-24 NOTE — Assessment & Plan Note (Signed)
He was evaluated by his cardiologist Dr Clayborn Bigness in July and no stress test or ECHO was  done.

## 2014-07-24 NOTE — Assessment & Plan Note (Signed)
Improved control with the addition of glipizide ER 2.5 mg in October for persistent hyperglycemia. He is taking an ACE I , high potency statin and ASA.  Neuropathy noted ,  Foot and eye exams are up to date.    Lab Results  Component Value Date   HGBA1C 6.9* 07/21/2014   Lab Results  Component Value Date   MICROALBUR 3.4* 02/23/2014   Lab Results  Component Value Date   CHOL 136 07/21/2014   HDL 30.40* 07/21/2014   LDLCALC 88 10/30/2013   LDLDIRECT 79.0 07/21/2014   TRIG 256.0* 07/21/2014   CHOLHDL 4 07/21/2014

## 2014-07-24 NOTE — Assessment & Plan Note (Signed)
He finished adjuvant chemo in July 2013 and has had no recurrence of CA  by surveillance scans,  Done every 6 months , last one Jan 2016.  Managed by time [facility]

## 2014-07-24 NOTE — Assessment & Plan Note (Addendum)
He remains asymptomatic but is not exercising regularly,  Continue ASA, statin, ACE Inhibitor.  Not on beta blociker secondary to low resting heart rate.   Lab Results  Component Value Date   CHOL 136 07/21/2014   HDL 30.40* 07/21/2014   LDLCALC 88 10/30/2013   LDLDIRECT 79.0 07/21/2014   TRIG 256.0* 07/21/2014   CHOLHDL 4 07/21/2014

## 2014-07-24 NOTE — Assessment & Plan Note (Signed)
He remains asymptomatic at rest but has some mild exertional dyspnea which was deemed noncardiac in July by [facility].

## 2014-07-24 NOTE — Assessment & Plan Note (Addendum)
TSH is suppressed as is standard for post thyroidectomy .  Continue current dose of levothyroxine.   Lab Results  Component Value Date   TSH 0.21* 07/21/2014

## 2014-07-24 NOTE — Assessment & Plan Note (Signed)
Foot exam normal except for decreased sensation.  Continue gabapentin qhs

## 2014-07-25 ENCOUNTER — Other Ambulatory Visit: Payer: Self-pay | Admitting: Internal Medicine

## 2014-07-26 ENCOUNTER — Encounter: Payer: Self-pay | Admitting: *Deleted

## 2014-07-26 ENCOUNTER — Ambulatory Visit: Payer: Self-pay | Admitting: Oncology

## 2014-08-07 ENCOUNTER — Other Ambulatory Visit: Payer: Self-pay | Admitting: Internal Medicine

## 2014-08-18 LAB — HM DIABETES EYE EXAM

## 2014-08-23 ENCOUNTER — Encounter: Payer: Self-pay | Admitting: Internal Medicine

## 2014-09-18 ENCOUNTER — Other Ambulatory Visit: Payer: Self-pay | Admitting: Internal Medicine

## 2014-10-08 ENCOUNTER — Other Ambulatory Visit: Payer: Self-pay | Admitting: Oncology

## 2014-10-08 DIAGNOSIS — C3490 Malignant neoplasm of unspecified part of unspecified bronchus or lung: Secondary | ICD-10-CM

## 2014-10-15 NOTE — Op Note (Signed)
PATIENT NAME:  Nathaniel Lane, Nathaniel Lane MR#:  [id number] DATE OF BIRTH:  07/23/1945  DATE OF PROCEDURE:  09/17/2012  PREOPERATIVE DIAGNOSIS: Thyroid nodule.   POSTOPERATIVE DIAGNOSIS:  Thyroid nodule.   OPERATION PERFORMED: 1.  Total thyroidectomy.  2.  Central compartment lymph node dissection   SURGEON: Consuela Mimes, M.D.   FIRST ASSISTANT: Dr. Marlyce Huge.   ANESTHESIA: General.   INDICATIONS FOR PROCEDURE:  The patient is a 69 year old white male who was found to have a strongly PET-positive nodule in his left superior thyroid gland upon workup of his lung cancer. He has now completed all of his therapy for his lung cancer and has no evidence of disease and wished to have no further workup of his thyroid nodule, but to proceed directly to total thyroidectomy.   PROCEDURE IN DETAIL: The patient was placed supine on the operating room table and prepped and draped in the usual sterile fashion. An incision was made in the lines of the skin of the neck above the suprasternal notch, and this was carried down through the subcutaneous tissue and platysma muscle with the electrocautery. Subplatysmal flaps were created superiorly and inferiorly, and the intermediate fascia of the neck was opened in the midline between the strap muscles, and the strap muscles were dissected off of the anterior thyroid lobes bilaterally. There was an extremely thin, narrow isthmus and the thyroid gland was essentially fairly high and lateral in the neck bilaterally. The middle thyroid vein was ligated and divided with the Harmonic scalpel. Inferior pole vessels were ligated and divided with the Harmonic scalpel, and on the left side a complete central lymph node dissection was undertaken once the recurrent laryngeal nerve was identified. The recurrent laryngeal nerve was spared but all of the lymph nodes between the sternocleidomastoid muscles and down to the suprasternal notch and covering the trachea were excised.  These lymph nodes were sent separately, and then the remainder of the left thyroid lobe dissection was undertaken by taking down the superior pole vessels with the Harmonic scalpel and rotating the thyroid medially. The small vasculature near the recurrent laryngeal nerve where it pierced the cricothyroid was controlled with a combination of medium Hemoclips and the bipolar electrocautery. Berry's ligaments were divided and there was essentially no pyramidal lobe.   An identical procedure was performed on the right side; however on this side a number of the central compartment lymph nodes were left attached to the lower pole of the thyroid gland itself. On the left, the left superior parathyroid gland was identified and was somewhat cyanotic. The left inferior parathyroid gland was identified and was also somewhat cyanotic. On the right side the right inferior parathyroid gland was identified and it was left intact, along with possibly another soft lymph node. The right superior parathyroid gland was also left intact, but it was significantly cyanotic at the end of the procedure. On the right the recurrent laryngeal nerve was also identified and the remainder of the procedure was undertaken in a similar fashion to what had been done on the left.   Once the thyroid was completely removed hemostasis was assured and this required placement of small 1 cm square portions of thrombin-soaked Gelfoam right where the nerves appears to be cricothyroid bilaterally. A 7 mm TLS drain was placed through the suprasternal notch and placed on both sides of the neck and the intermediate fascia was closed with a running 3-0 Monocryl suture. The platysma was closed with a running 4-0 Monocryl suture and the  skin was reapproximated with a running subcuticular 5-0 Monocryl and suture strips.   The patient tolerated the procedure well. There were no complications.    ____________________________ Consuela Mimes,  MD wfm:dm D: 09/17/2012 11:03:00 ET T: 09/17/2012 11:32:51 ET JOB#: [id number]  cc: Consuela Mimes, MD, <Dictator> Consuela Mimes MD ELECTRONICALLY SIGNED 09/17/2012 15:43

## 2014-10-15 NOTE — H&P (Signed)
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 with another focus of 2 mm found in the right lobe upon completed thyroidectomy The patient was rendered hypothyroid with endogenous serum TSH increased to 63 miu and was admited yesterday for oral ablation with a large dose of 163 mci for primary and secomdary metastasis to 2 out 3 LN in the cervical chain of lymph nodes   Past History see H&P   Past Medical Health Hypertension, Diabetes Mellitus, Cancer   Primary Physician solym   Past Med/Surgical Hx:  Lung Cancer:   Non-productive cough:   Sleep Apnea/No CPap:   Myocardial Infarct:   Pneumonia:   CAD:   Hyperlipidemia:   Gout:   Diabetes Mellitus, Type II (NIDD):   benign prostatic hypertrophpy:   HTN:   Port-A-Cath:   Thoracotomy, Right Lobectomy:   Thoracotomy:   cabg (2 vessels):   ALLERGIES:  Penicillin: Other, Itching, Hives  HOME MEDICATIONS: Medication Instructions Status  aspirin 81 mg oral tablet 1 tab(s) orally once a day Active  tamsulosin 0.4 mg oral capsule 1 cap(s) orally once a day Active  enalapril 10 mg oral tablet 1 tab(s) orally once a day Active  levothyroxine 125 mcg (0.125 mg) oral tablet 1 tab(s) orally once a day Active   Family and Social History:  Family History Non-Contributory   Social History negative tobacco   Place of Living Home   Review of Systems:  Fever/Chills No   Cough No   Sputum No   Abdominal Pain No   Diarrhea No   Constipation No   Nausea/Vomiting No   SOB/DOE No   Chest Pain No   Telemetry Reviewed NSR   Dysuria No   Tolerating PT No   Tolerating Diet No   Medications/Allergies Reviewed Medications/Allergies reviewed   Physical Exam:  GEN well developed, well nourished, no acute distress   HEENT PERRL   NECK supple   RESP  normal resp effort   CARD regular rate   ABD denies tenderness  no liver/spleen enlargement   LYMPH negative neck   EXTR negative cyanosis/clubbing   SKIN normal to palpation   NEURO cranial nerves intact   PSYCH A+O to time, place, person   Radiology Results: XRay:    06-Aug-13 19:07, Abdomen 3 Way Includes PA Chest  Abdomen 3 Way Includes PA Chest  REASON FOR EXAM:    Pain/weakness/ vomiting  COMMENTS:   May transport without cardiac monitor    PROCEDURE: DXR - DXR ABDOMEN 3-WAY (INCL PA CXR)  - Jan 29 2012  7:07PM     RESULT:     Findings: There is flattening of the hemidiaphragms and consolidative   density with a meniscal apex in the right lung base. Discoid atelectasis   is also identified. A right-sided Port-A-Cath is appreciated.     Air is seen within nondilated loops of large and small bowel as well as a   large amount of stool. Thevisualized bony skeleton is unremarkable.     IMPRESSION:  1. Nonobstructive bowel gas pattern with a large amount of stool.  2. Likely effusion right lung base as well as possibly discoid   atelectasis.     Thank you for the opportunity to contribute to the care of your patient.             Verified By:  Mikki Santee, M.D., MD  LabUnknown:    24-Jan-14 12:45, CT Chest Without Contrast  PACS Image    31-May-14 16:11, Thyroid Whole Body (Part 3 of 4)  PACS Image    14-Jul-14 10:59, CT Chest Without Contrast  PACS Image    22-Jul-14 13:45, I-131 Therapy For Ablation - Nuc Med  PACS Image  CT:    28-Oct-13 09:39, CT Chest Without Contrast  CT Chest Without Contrast  REASON FOR EXAM:    restaging lung CA  COMMENTS:       PROCEDURE: MCT - MCT CHEST WITHOUT CONTRAST  - Apr 21 2012  9:39AM     RESULT: Comparison: CT of the chest 09/10/2011    Technique: Multiple axial images of the chest were obtained without   intravenous contrast.    Findings:  Small partially calcified nodule in the left thyroid gland is similar  to   prior. This is nonspecific. No mediastinal, hilar, over axillary   lymphadenopathy. Right portacatheter tip terminates in the SVC.   Calcifications are seen in the coronary arteries. Postoperative changes     seen from right lower lobectomy. Mild soft tissue thickening along the   posterior aspect of the mediastinum at level is like related to   postoperative changes. This region measures approximately 3.3 x 2.5 cm.   There is a small amount of right pleural fluid. There are several small   round foci of air in the posterior right lung base in this region of   pleural fluid.     There are likely small calcifications within the gallbladder.Small   nodule in the left adrenal gland is similar to prior and demonstrates   density consistent with an adrenal adenoma.    There is mild to moderate centrilobular emphysema. There is a 6 mm   irregular nodular density in the right upper lobe as seen on image 40.   This is more conspicuous than prior, but this may related to differences   in right upper lobe configuration related to the recent surgery. 6 mm     nodular density at the left costophrenic angle is similar to prior, image   86. This may represent an area of scarring.    No aggressive lytic or sclerotic osseous lesions are identified.    IMPRESSION:   1. Interval right lower lobectomy. Soft tissue thickening along the   posterior medial aspect of the mediastinum and right hilum are likely   related to postoperative changes. Residual/recurrent malignancy would be   difficult to exclude. Continued followup is recommended.  2. Irregular 6 mm nodular density in the right upper lobe is slightly   more conspicuous than prior, though this difference may be related to   differences in lung positioning related to surgery. This is   indeterminate. Continued followup is recommended.  3. There is a small amount of right pleural fluid with multiple foci of     pleural based air at the  right lung base. This may represent residual   loculated hydropneumothorax. However, an empyema or bronchopleural   fistula is not excluded. Clinical correlation is suggested.          Verified By: Gregor Hams, M.D., MD    24-Jan-14 12:45, CT Chest Without Contrast  CT Chest Without Contrast  REASON FOR EXAM:    restaging lung CA  COMMENTS:       PROCEDURE: CT  - CT CHEST WITHOUT CONTRAST  - Jul 18 2012 12:45PM  RESULT:     TECHNIQUE: CT of the chest without contrast is reconstructed at 3 mm   slice thickness in the axial plane and compared to previous images from   21 April 2012 and 10 September 2011.     FINDINGS: There is pleural thickening at the right lung base posterior   gutter with some central fluid. A well defined mass is not appreciated.   There is some density in the infrahilar paramediastinal posterior region   of the right hemithorax from image 52 to image 67. Some of this could be     post therapy or post operative. Extensive coronary artery calcification   is noted. There is underlying emphysematous lung disease. No new   pulmonary mass is appreciated. There is some minimal fibrosis at the left   lung base posteriorly and laterally with some linear density which could   represent subsegmental or discoid atelectasis or fibrosis bilaterally. No   endobronchial lesion is evident. There is cholelithiasis evident in the   upper abdominal images on image 102. There is minimal nodularity in the   body of the left adrenal gland which is unchanged compared to the   previous study and showing a long axis of 1.87 cm and a short axis of   1.20 cm on image 103. Degenerative changes are present in the spine.   Port-A-Cath device is present with the tip of the catheter in the   superior vena cava near the right atrium.    IMPRESSION:    1. Some residual pleural thickening and fluid is seen in the area of   previous therapy. No well defined mass is evident. Other changes  are   present as dictated above.   2. There is evidence of cholelithiasis.   3. There is a stable appearing, left adrenal nodule.   4. There is underlying lung disease with some areas of presumed fibrosis.    Dictation Site: 1      Thank you for this opportunity to contribute to the care of your patient.         Verified By: Sundra Aland, M.D., MD    14-Jul-14 10:59, CT Chest Without Contrast  CT Chest Without Contrast  REASON FOR EXAM:    restaging lung CA  COMMENTS:       PROCEDURE: KCT - KCT CHEST WITHOUT CONTRAST  - Jan 05 2013 10:59AM     RESULT: Axial noncontrast CT scanning was performed through the chest   with reconstructions at 3 mm intervals and slice thickness. Review of   multiplanar reconstructed images was performed separately on the VIA   monitor. Comparison made to studies of 18 July 2012 and April 21, 2012.     There is stable soft tissue fullness in the right posterior costophrenic   gutter extending medially along the paravertebral region. There is a   small amount of pleural thickening on the left posteriorly with punctate   calcifications. There are no alveolar infiltrates. There is minimal     emphysematous change in both lungs.A small amount of pleural-based   density posteriorly in the left upper lobe remains.    The cardiac chambers are not enlarged. There are coronary artery   calcifications. The caliber of the thoracic aorta is normal. No   pathologic sized mediastinalor hilar lymph nodes are demonstrated. There   is a small hiatal hernia.    Within the upper abdomen the observed portions of the liver and spleen  are normal. Layering calcified gallstones are demonstrated. There is a   stable mass in the body of the left adrenal gland on image 107 which   measures 1.2 x 1.8 cm.    IMPRESSION:   1. There is a stable appearance of both lungs and of the pleural-based     densities in the right lower hemithorax and in the posterior  aspect of   the left upper hemithorax.  2. I do not see new masses or other areas of acute parenchymal   abnormality.  3. There is no bulky mediastinal or hilar lymphadenopathy. There are   coronary artery calcifications present.  4. There is a stable left adrenal mass. Stable appearing gallstones are   demonstrated.     Dictation Site: 1        Verified By: DAVID A. Martinique, M.D., MD  Nuclear Med:    31-May-14 16:11, Thyroid Whole Body (Part 3 of 4)  Thyroid Whole Body (Part 3 of 4)  REASON FOR EXAM:    Thyroid cancer  COMMENTS:       PROCEDURE: NM  - NM THYROID WB 48HR  - Nov 22 2012  4:11PM     RESULT: The patient is a history of total thyroidectomy with some   attached lymph nodes in the surgical specimen showing papillary thyroid   carcinoma follicular variant in the left lobe measuring 2.0 cm with a 0.2   cm papillary thyroid carcinoma conventional in the right lobe. Lymph   nodes were negative for malignant disease. There was tumor capsule   present with focal capsular invasion and no lymphatic or extrathyroidal   extension. Vascular invasion was not demonstrated. The patient was given   a dose of 4.32 mCi of I-131 with Thyrogen supplementation. Images   obtained over the whole body from the cranial vertex into the thighs at   24 and 48 hours showed no significant activity over the neck area there     is some GI and GU localization as well as mucosal localization in the   sinuses. Regional images show no additional abnormality.    IMPRESSION:  No significant residual activity of the thyroid region. The   patient is scheduled for post thyroidectomy and ablation. There is no   recent TSH level showing elevated TSH which is necessary for post   surgical ablation.    Dictation Site: 1        Verified By: Sundra Aland, M.D., MD    22-Jul-14 13:45, I-131 Therapy For Ablation - Nuc Med  I-131 Therapy For Ablation - Nuc Med  REASON FOR EXAM:    113mi Papillary  thyroid cancer with lymph node   metastasis  COMMENTS:       PROCEDURE: NM  - NM I- 131 THERAPY FOR ABLATION  - Jan 13 2013  1:45PM     RESULT:     DOSE:  163 mCi sodium iodide capsule.     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     1.  Nathaniel Lane a 69year old, white male who recently was found to have     thyroid cancer during a work-up that he underwent and management of right   lung cancer. The patient underwent initial right lobectomy of thyroid   followed by completed total thyroidectomy which revealed  the above   pathology. The patient was rendered hypothyroid by stopping his exogenous   thyroxin. He was referred for ablation therapy with radioactive iodine of   any micro or macro tissue remnant in the thyroid bed. Post surgical whole   body iodine neck and chest scan reported the presence of "small" tissue   in the thyroid bed. No quantification or percentage of that activity was   reported on the report by [facility].  2.  The patient's serum TSH was monitored quickly and initially was at a   level of 16 mg/dl the following week and increased to 63 micro   international units/dl and the patient was planned and prepared for   admission to the hospital for ablation therapy with radioactive iodine   from the thyroid tissue of the primary cancer and additional metastases   presence for any microscopic tissue. The patient was on a low iodine     diet.   3.  The patient was admitted to Room 106 for radiation therapy. Radiation   precaution isolation were instructed and maintained by the Nuclear   Medicine Department and at 1:30 a capsule containing 163 mCi of iodine   131 and sodium iodide solution containing capsule was given orally   without any complication or adverse reaction. The patient was  confined to   his bed and radiation monitoring was performed and revealed exposeure   rate of 48 mrem/per hour. The patient was intructed and advised to remain   confined to his room with increased pure liquid, stay on the same home   medication, take multiple showers and daily monitoring would be performed   until his radiation exposure rate is and will be below ALARA. Daily   monitoring and visitation by the Nuclear Medicine technologist and the   attending physician will be performed as per routine.     IMPRESSION: Radioactive iodine oral ablation therapy 163 mCi for     metastatic papillary carcinoma of thyroid for 2.0 cm papillary thyroid   cancer with two positive lymph nodes in the neck.            Verified By: Lourdes Sledge Angelita Harnack, M.D., MD    Assessment/Admission Diagnosis 1. metastatic thyroid cancer 2.0 cm left lobe 2 mm right lobe with 2/3 positive LN in the neck 2. Post procedural post surgical hypothyroidism   Plan in patient large dose of oral I-131 therapy 01/13/2013 163 MCI     Admission/Visit Status,  Inpatient; -  Dr.'s Service: Ronnald Collum   Preferred Unit: 1C - Oncology   Diagnosis: Thyroid Cancer, 193 Malignant neoplasm of thyroid gland-papillary thyroid cancer with lymph node metastasis  I certify that: I expect the patient to need inpatient services for at least 2 midnights.  Date of Admission: 13-Jan-2013, 13-Jan-2013, Active, Standard   Acetaminophen ES tablet, ( Tylenol ES)  500 to 1000 mg Oral q4-6h PRN for pain or temp. greater than 100.4  - Indication: Pain/Fever, 13-Jan-2013, Active, Standard   Aspirin Enteric Coated tablet, ( Ecotrin)  81 mg Oral daily  - Indication: Pain/Fever/Thromboembolic Disorders/Post MI/Prophylaxis MI  Instructions:  Initiate Bleeding Precautions Protocol--DO NOT CRUSH, 13-Jan-2013, Active, Standard   Enalapril tablet, ( Vasotec)  10 mg Oral daily  - Indication: Antihypertensive/ CHF, 13-Jan-2013, Active, Standard    Promethazine injection, ( Phenergan injection )  12.5 mg, Intramuscular, q6h PRN for nausea, vomiting, motion sickness  Indication: Antiemetic/ Motion Sickness/ Sedative/ Antihistamine, 13-Jan-2013, Active, Standard   Tamsulosin capsule, ( Flomax)  0.4 mg Oral daily after meal  - Indication: Symptoms of Benign Prostatic Hyperplasma, 13-Jan-2013, Active, Standard   Nursing Saline Flush, 3 to 6 ml, IV push, Q1M PRN for IV Maintenance, 14-Jan-2013, Active, Standard   I-131 Therapy For Ablation - Nuc Med, Today-Routine-192mi Papillary thyroid cancer with lymph node metastasis**After hours/weekends/holidays - the ordering physician must contact the Radiologist on call to have the study performed, 13-Jan-2013, 1 or more Final Results Received, Standard   Dietary Miscellaneous Item, Place all food on disposable trays, with plastic utensils. Send ice cream with meals TID. Increase PO liquids., 13-Jan-2013, Active, Standard   NPO, 13-Jan-2013, Discontinued, Standard   Sodium Restrictive Diet, 2 Grams Sodium, 13-Jan-2013, Active, Standard   Bleeding Precautions Protocol, 13-Jan-2013, Active, Standard   Isolation Radiation, Radiation badge, gown, gloves, mask, and shoe covers must be worn.  NO VISITORS. DISPOSABLE ITEMS ONLY., 13-Jan-2013, Active, Standard   VS Routine QShift, Q Shift, 13-Jan-2013, Active, Standard   Hygiene, 14-Jan-2013, Active, Standard   Activity, Confined to room-Comments:NO VISITORS as per radiation precaution protocol., 13-Jan-2013, Active, Standard   Nursing Miscellaneous Item, Ice water and lemon swabs at bedside at all times., 13-Jan-2013, Active, Standard   Nursing Miscellaneous Item, All personnel attending to patient to follow precautions and restrictions as per nuc med- please post nurse's instructions on chart., 13-Jan-2013, Active, Standard   Nursing Miscellaneous Item, Nurse must wear film badge when attending to patient., 13-Jan-2013, Active, Standard    Nursing Miscellaneous Item, Daily monitoring of radiation levels per nuc med, 13-Jan-2013, Active, Standard   Nursing Miscellaneous Item, Page me with any questions or problems regarding radiation iodine contamination, ect. ##[phone]  Please leave pager # on front  of chart., 13-Jan-2013, Active, Standard   Obtain copy of:, Outpatient Labs, Xray reports, pathology, notes, 13-Jan-2013, Pending, Standard   Old Chart to Floor, 13-Jan-2013, Pending, Standard   Clinical Parameters Reviewed, (such as vital signs, lab results, etc) as appropriate for the medication(s) being administered, 14-Jan-2013, Active, Standard   Universal Skin Precautions, 14-Jan-2013, Active, Standard   OB-Gyn Status, 13-Jan-2013, Active, Standard   QShift Chart Check Complete, 13-Jan-2013, Performed, Standard   24 Hour Chart Check Complete, 14-Jan-2013, Performed, Standard   QShift Chart Check Complete, 14-Jan-2013, Performed, Standard  Electronic Signatures: Nathaniel Lane(MD)  (Signed 23-Jul-14 18:40)  Authored: CHIEF COMPLAINT and HISTORY, PAST MEDICAL/SURGIAL HISTORY, ALLERGIES, HOME MEDICATIONS, FAMILY AND SOCIAL HISTORY, REVIEW OF SYSTEMS, PHYSICAL EXAM, Radiology, ASSESSMENT AND PLAN, Orders   Last Updated: 23-Jul-14 18:40 by Nathaniel Lane(MD)

## 2014-10-15 NOTE — H&P (Signed)
PATIENT NAME:  Nathaniel Lane, Nathaniel Lane MR#:  [id number] DATE OF BIRTH:  1945/12/10  DATE OF ADMISSION:  01/13/2013  CHIEF COMPLAINT: Radioactive iodine-131 therapy ablation for thyroid cancer.  HISTORY OF PRESENT ILLNESS: Nathaniel Lane is a 69 year old white male who was recently diagnosed with a follicular variant of papillary thyroid cancer involving primarily the left lobe that was found to have a 2.0 cm focus of cancer, underwent thyroidectomy completed on 09/17/2012 that revealed another small 0.2 cm thyroid cancer nodule in the right lobe. The patient also underwent central lymph node dissection at the time of total thyroidectomy, and that revealed 2 out of 3 positive lymph nodes for thyroid cancer metastases. The patient was admitted after his exogenous thyroxine replacement therapy was stopped in preparation for this radioactive iodine ablation therapy, and prior to admission, his endogenous  TSH was increased to a level of 64 ng/dL. The patient was also on a low-iodine diet in preparation for this therapy. In addition to postsurgical hypothyroidism and his thyroid cancer, the patient is known to have a right lower lobe lung cancer and underwent chemotherapy.  PAST MEDICAL AND SURGICAL HISTORY:   1.  Follicular variant of papillary thyroid cancer, status post total thyroidectomy 09/17/2012, primarily involving left lobe 2.0 cm and another focus right lobe 2 mm, with 2 of 3 positive metastatic lymph nodes. 2.  Postsurgical and postprocedural hypothyroidism.  3.  Hypertension.  4.  Hyperlipidemia.  5.  Valvular heart disease.  6.  Renal insufficiency. 7.  Lung cancer, status post right lobectomy April 2013, followed by chemotherapy and mediastinal lymphadenectomy. 8.  Gout. 9.  Type 2 diabetes mellitus.  10.  Benign prostatic hypertrophy. 11.  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  FAMILY HISTORY: Negative for the presence of thyroid cancer.   REVIEW OF SYSTEMS: The  patient complains of cold intolerance, slight dysphagia,  hoarseness of his voice. EARS, NOSE, THROAT: No headache. No discharge from the ears or throat or nasal passages. NECK: Slight dysphagia, dryness of the throat.  RESPIRATORY: No wheezing. No coughing. No expectoration. CARDIOVASCULAR: No chest pain or shortness of breath. GASTROINTESTINAL: No nausea or vomiting or diarrhea.  EXTREMITIES: There is no weakness or paralysis or paresthesia. PSYCHOLOGICAL AND NEUROLOGICAL: The patient denied depression or anxiety. Slight insomnia.  PHYSICAL EXAMINATION:   VITAL SIGNS: Blood pressure 90/60. Pulse rate 72, regular. Weight 160. Temperature 98. Oxygen saturation 96%.  HEAD: Normocephalic. No current lesion or laceration or cut or bruising.  EARS, NOSE, THROAT: No abnormalities.  NECK: Scar from previous thyroidectomy, healing well,  no evidence of infection or tenderness. LUNGS: Clear. No wheezing or rhonchi. Scar from lung surgery on the right side.  HEART: Regular rate and rhythm, soft systolic murmur grade II/VI, no gallop.  ABDOMEN: Soft. No tenderness, rebound or guarding. No organomegaly.  RECTAL: Deferred.  EXTREMITIES: Reflexes diminished, symmetrical. No edema. Pulses present and equal.   ASSESSMENT AND PLAN:  1.  Radioactive iodine-131 ablation therapy for remnant microscopic and macroscopic thyroid tissue in the thyroid bed for 2.0 follicular variant of papillary thyroid cancer, primarily involving the left lobe with 2.0 cm cancerous focus and a smaller 2 mm in the left lobe, status post completed thyroidectomy 09/17/2012. 2.  Iodine-131 therapy with 150 mCi oral dose.  3.  Postsurgical hypothyroidism. 4.  Type 2 diabetes mellitus.   5.  Systemic hypertension. 6.  Lung cancer, status post right lobectomy April 2013, followed by chemotherapy and mediastinal lymphadenectomy.  7.  Chronic renal  insufficiency.  8.  Valvular heart disease.  9.  Hyperlipidemia.  10.  Benign prostatic  hypertrophy. 11.  Primary gout. 12.  Adenocarcinoma of colonic polyps, status post polypectomy.  LABORATORY DATA: Please see chart.  MEDICATIONS: Aspirin 81 mg by mouth once daily, enalapril 10 mg by mouth once daily, Flomax 0.4 mg capsule by mouth once daily.  PLAN:  1.  The patient is admitted to room 106 at this time for radiation therapy. 2.  The patient was admitted with isolation for radiation precautions.  3.  150 mCi of iodine-131 oral solution, sodium iodine contained in a capsule, was ordered and administered today. Total dose calculated was up to 163 mCi.  4.  The patient will be confined to his bed with radiation precautions and isolation and measurements of his radiation dose monitor daily  by the nuclear medicine department.  All fluids and meals will be provided on a disposable tray, his regular medication will be given as before and the patient will be discharged when his radiation dose in the total body  is below ALARA.  5.  Initial radiation exposure rate was calculated at 1 meter to be 48 mrem/hour exposure rate for ALARA is below 12 mrem/hour   ____________________________ Lenard Simmer, MD [email or web address] D: 01/13/2013 19:58:27 ET T: 01/13/2013 21:58:14 ET JOB#: [id number]  cc: Lenard Simmer, MD, <Dictator> A. Lavone Orn, MD Gwynn[address], [address], [place], [place] [id number] ELECTRONICALLY SIGNED 01/14/2013 8:18

## 2014-10-15 NOTE — Discharge Summary (Signed)
PATIENT NAME:  Nathaniel, Lane MR#:  [id number] DATE OF BIRTH:  1946-06-01  DATE OF ADMISSION:  01/13/2013 DATE OF SUMMARY:  01/15/2013   ATTENDING PHYSICIAN:  Dr. Belinda Fisher.  REFERRING PHYSICIAN:  Dr. Lavone Orn.  DISCHARGE DIAGNOSES: 1.  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  2.  Radioactive iodine ablation therapy 131 for remnant thyroid tissue in the thyroid bed.  3.  Postsurgical hypothyroidism.  4.  History of diabetes mellitus, hypertension, hyperlipidemia, gout, and benign prostatic hypertrophy, lung cancer and renal insufficiency.   PROCEDURES: Radioactive iodine-131 ablation dose 163 mCi given orally on admission date.   HISTORY OF PRESENT ILLNESS: Nathaniel Lane is a 69 year old white male who was diagnosed with thyroid cancer during the work-up of lung cancer and underwent initially left lobectomy then complete thyroidectomy on 09/17/2012. The patient was referred for radioactive iodine ablation therapy for any remnant thyroid tissue in the neck. Of note, the patient during complete thyroidectomy was found to have 2 positive lymph nodes in the neck out of 3 removed. The patient was rendered hypothyroid after stopping his exogenous thyroxine and 4 days prior to admission his TSH level increased to 64 ng/dL while on low iodine diet.   PAST MEDICAL AND SURGICAL HISTORY: Please refer to the admitting history and physical.   [place]:  1.  On admission date the patient was admitted to Room 106, assigned for radiation therapy. Radiation precaution and isolation were maintained throughout the admission. The patient was continued on the same outpatient medications except thyroxine.  2.  Oral capsule of sodium iodide containing 163 mCi iodine-131 was given in the afternoon of 07/22 without any complications or adverse reaction. The patient was confined to his room with no  visitors allowed as per radiation precaution and isolation protocols.  3.  The patient radiation exposure dose was measured daily and it was 50 mrm on admission down to 22 mrm per hour the following day and down to 9 mrm on the afternoon of the discharge date.  4.  The patient was discharged with instructions both verbally and in writing to maintain radiation precaution for 7 days in terms of close contact with pregnant women and children under 8 years of age. The patient was advised to resume thyroxine supplement that same day of discharge. The patient was advised to contact his doctor to have a follow-up in approximately 4  weeks to adjust in the thyroid dose as necessary.  5. The patient is to have a posttherapy neck and chest scan to see if there is any additional uptake  seen in the neck or chest, in the thyroid bed and/or lymph nodes. The patient was advised he will need another scan in one year as per thyroid cancer follow-up protocol.   ____________________________ Lenard Simmer, MD sjm:dp D: 01/16/2013 14:00:08 ET T: 01/16/2013 14:49:49 ET JOB#: [id number]  cc: Lenard Simmer, MD, <Dictator> A. Lavone Orn, MD Gwynneth Munson, [address] [place], [place], [place] [address] ELECTRONICALLY SIGNED 01/19/2013 12:34

## 2014-10-17 NOTE — Consult Note (Signed)
Brief Consult Note: Diagnosis: n/v, dehydration, diabetes, HTN, CAD, hyperlipidemia.   Patient was seen by consultant.   Consult note dictated.   Recommend further assessment or treatment.   Orders entered.   Comments: 1. N/v, likley viral disease, supportive therapy, IVF, antiemetics 2. dehydration, IVf as above 3. Diabtes mellitus , Pt's PO intake is poor, start SSI, hold metformin, adjust according to po intake. 4. HTN, hold BP meds, as pt is not hypertensive at trhis time, restart if BP is higher 5. CAD, get EKG,  no CP reported 6. HYperlipidmia , hold po meds until able to take PO 7. Gout, asymptomatic, follow 8. s/p RLL lobectomy for lung carcinoma, chest tube will be removed  by DR Genevive Bi, epr patient, chest xray shows minimal pneumothorax Thanks for consult, we'll follow.  Electronic Signatures: Theodoro Grist (MD)  (Signed 07-May-13 16:35)  Authored: Brief Consult Note   Last Updated: 07-May-13 16:35 by Theodoro Grist (MD)

## 2014-10-17 NOTE — Consult Note (Signed)
PATIENT NAME:  Nathaniel Lane, Nathaniel Lane MR#:  [id number] DATE OF BIRTH:  [birth year]  DATE OF CONSULTATION:  10/11/2011  REFERRING PHYSICIAN:  Nestor Lewandowsky, MD  CONSULTING PHYSICIAN:  Vivien Presto, MD  PRIMARY CARE PHYSICIAN: Deborra Medina, MD   CARDIOLOGIST: Lujean Amel, MD   ONCOLOGIST: Delight Hoh, MD   REASON FOR CONSULTATION: Medical management.    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 The patient currently has chest tubes and a JP tube. Medicine was consulted for medical management. The patient denies having any chest pain, shortness of breath. The patient's diabetes as an outpatient is well controlled on metformin. The patient did have some hypotensive episodes and received 300 mL of IV fluids today. The patient denies having any dizziness, palpitations, nausea, vomiting, diarrhea. Since epidural removal, blood pressure recheck has been improved. The patient had an episode of blood pressure being 70/38 and currently is 102/65. The patient is on room air and states overall he is doing well. Of note, the patient has had PFTs as an outpatient as well as a stress test as an outpatient which was negative.   PAST MEDICAL HISTORY:  1. Coronary artery disease status post CABG. 2. Previous MI. 3. Diabetes. 4. Hypertension. 5. Hyperlipidemia.  6. Gout.   PAST SURGICAL HISTORY:  1. Status post CABG. 2. Status post right thoracotomy, right lower lobe resection, and lymph node resection and biopsy.   ALLERGIES: Penicillin.  MEDICATIONS AS AN OUTPATIENT:  1. Tamsulosin 0.4 mg daily.  2. Niaspan daily.  3. Lipitor 20 mg daily.  4. Metformin 500 mg b.i.d.  5. Enalapril 10 mg daily.  6. Hydrochlorothiazide 25 mg daily.   SOCIAL HISTORY:  40 year plus tobacco use history, quit six years ago. No alcohol or drug use. Married. Has two children.   FAMILY HISTORY: Dad with hypertension. No family history of malignancy reported.  REVIEW OF SYSTEMS: CONSTITUTIONAL: Denies fever, fatigue, weakness, or weight changes. HEENT: No blurry vision, double vision, snoring, or postnasal drip. No tinnitus. RESPIRATORY: Occasional cough which is nonproductive. No wheezing. No shortness of breath or dyspnea on exertion. Has no history of COPD. CARDIOVASCULAR: Denies any chest pain. No orthopnea or swelling in the legs. No history of arrhythmias. GI: No nausea, vomiting, diarrhea, abdominal pain. GU: Denies dysuria, hematuria, incontinence. ENDOCRINE: Denies polyuria, nocturia, or thyroid problems. HEME/LYMPH: Denies anemia or easy bruising. SKIN: No new rashes. MUSCULOSKELETAL: Some arthritis. PSYCH: No anxiety or depression. NEUROLOGIC: Denies any history of CVA, seizures, or TIA.   PHYSICAL EXAMINATION:   VITAL SIGNS: Temperature 97.6, heart rate 90, last blood pressure 102/65, MAP of 77, oxygen sat 94% on room air, respiratory rate 18.   GENERAL: The patient is a well developed Caucasian male sitting in bed in no obvious distress.   HEENT: Normocephalic, atraumatic. Pupils are equal and reactive. Anicteric sclerae. Moist mucous membranes.   NECK: Supple. No JVD. No thyroid tenderness. No lymphadenopathy.   CARDIOVASCULAR: S1, S2 regular rate and rhythm. No murmurs, rubs, or gallops.   LUNGS: Clear to auscultation on the left. There are coarse breath sounds on the right. Chest tube and JP tubes in place and there is some minimal serosanguineous fluid in the JP tube.   ABDOMEN: Soft, nontender, nondistended. Hypoactive bowel sounds.  EXTREMITIES: No significant edema.   NEUROLOGICAL: Cranial nerves II through XII grossly intact. Strength 5 out of 5 in all extremities.   PSYCH: Awake, alert, oriented x3, pleasant, cooperative.  LABORATORY,  DIAGNOSTIC, AND RADIOLOGICAL DATA: BMP from the 16th showing glucose 298, BUN 19, creatinine 1.29, sodium 132. CBC on the 16th showing WBC of 16.8, hemoglobin 10.1, hematocrit 32.5, platelets 235.   Pathology results from April 15th showing squamous cell carcinoma of the lung, biopsy negative lymph nodes, clear margins.   X-ray of the chest today showing haziness in the right base compatible with trace effusion, atelectasis, or postop changes. No definitive pneumothorax. Multiple right chest tubes. Left lung is clear.   ASSESSMENT AND PLAN: We have a 69 year old Caucasian male with history of tobacco abuse, hypertension, coronary artery disease status post CABG, diabetes, hypertension, status post thoracotomy and right lower lobe resection of lung mass. Medicine was consulted for medical management.  1. In regards to the lung mass, the biopsy results shows it being squamous cell carcinoma without involving any lymph nodes, appears to have clear margins. The patient does follow-up with Dr. Grayland Ormond. We would advise a follow-up with him. The patient does have chest tubes currently to suction with a JP drain with mild to moderate amount of serosanguineous fluid. This is being followed up by Surgery. The patient did have epidural and it was discontinued in the setting of hypotension. Would advise continuing Percocet and we would add IV morphine p.r.n. for better pain control. The patient denies having any shortness of breath and has good oxygen sats.  2. In regards to diabetes, I would hold the metformin and check a BMP. Would start Lantus low dose at bedtime and continue sliding scale for now. Will check a hemoglobin A1c in the morning.  3. The patient does have a history of hypertension, did have hypotensive episode likely in the setting of epidural. The patient has no fever and is not short of breath, hypoxic, and has no other sign of infection. Would hold blood pressure medicines and start the patient on  some gentle fluids. Will check a BMP in the morning to evaluate kidney function. The patient does have some constipation and we would start a bowel regimen. Would continue the statin. Would encourage ambulation and incentive spirometry use. I would also order TEDs and SCDs for now.   The above was discussed with Dr. Genevive Bi, the patient and his wife.   Thank you for the opportunity to see your patient. We will follow-up through the hospitalization with you.   CODE STATUS: The patient is FULL CODE.   TOTAL TIME SPENT: 55 minutes.   ____________________________ Vivien Presto, MD sa:drc D: 10/11/2011 16:40:41 ET T: 10/12/2011 06:47:37 ET JOB#: [id number]  cc: Vivien Presto, MD, <Dictator>  Vivien Presto MD ELECTRONICALLY SIGNED 10/21/2011 19:29

## 2014-10-17 NOTE — Discharge Summary (Signed)
PATIENT NAME:  Nathaniel Lane, Nathaniel Lane MR#:  [id number] DATE OF BIRTH:  01/18/1946  DATE OF ADMISSION:  10/08/2011 DATE OF DISCHARGE:  10/23/2011  ADMITTING DIAGNOSIS: Right lower lobe mass.   DISCHARGE DIAGNOSIS:  Squamous cell carcinoma right lower lobe, pathologic stage T3N0M0.   OPERATION PERFORMED:  1. Right thoracotomy and right lower lobectomy.  2. Doxycycline bedside pleurodesis on two separate occasions.   HOSPITAL COURSE: [name] is a 69 year old gentleman who had a recently discovered right lower lobe mass. He was found to be a suitable candidate for surgery and was brought into the hospital on 10/07/2011 where he underwent a right thoracotomy and right lower lobectomy. The postoperative diagnosis was a squamous cell carcinoma staged as a T3N0M0. The patient had a prolonged air leak, and on two separate occasions was offered doxycycline bedside pleurodesis. The first attempt was poorly tolerated and the second attempt was performed in the operating room with intravenous sedation. After approximately 12 days of managing his chest tubes, he was placed to a water seal and there was no significant change in his chest x-rays. He did have a small air leak. On the day prior to discharge, he was placed to a Heimlich valve and a chest x-ray was obtained the day of discharge. This showed no change. There is a remaining airspace in the bottom aspect of the right hemithorax which is unchanged. His wife was instructed on managing his chest tubes and the patient was discharged home to follow up with Dr. Genevive Bi in two days. He will be seen by Dr. Grayland Ormond as well in followup. The patient did not request any pain medications.   MEDICATIONS: He was instructed to return to all of his home medications, which include: 1. Flomax 0.4 mg daily.  2. Lipitor 40 mg daily.  3. Metformin 500 mg twice a day.  4. Niacin 1000 mg once a day.     5. Ramipril 10 mg once a day. 6. Metoprolol daily.      ____________________________ Lew Dawes Genevive Bi, MD teo:bjt D: 10/23/2011 10:22:19 ET T: 10/23/2011 13:21:50 ET JOB#: [id number]  cc: Christia Reading E. Genevive Bi, MD, <Dictator> Louis Matte MD ELECTRONICALLY SIGNED 10/25/2011 14:29

## 2014-10-17 NOTE — Op Note (Signed)
PATIENT NAME:  Nathaniel Lane, Nathaniel Lane MR#:  [id number] DATE OF BIRTH:  Jun 03, 1946  DATE OF PROCEDURE:  11/19/2011  PREOPERATIVE DIAGNOSIS: Lung carcinoma.   POSTOPERATIVE DIAGNOSIS:  Lung carcinoma.  PROCEDURE: Port-A-Cath placement.   SURGEON: Rodena Goldmann, M.D.   ANESTHESIA: Monitored anesthetic care.   OPERATIVE PROCEDURE: With the patient in the supine position after the induction of appropriate intravenous sedation, the patient's right neck and chest were prepped with ChloraPrep and draped with sterile towels. The patient was appropriately padded and positioned. 1% Xylocaine buffered with sodium bicarbonate was injected under the clavicle. This area was incised, carried down through the subcutaneous tissue bluntly. Several passes were made in the subclavian vein and no successful cannulation was accomplished. The patient was then changed to a slightly greater Trendelenburg position and the neck interrogated with the ultrasound. The jugular vein was identified. 1% Xylocaine buffered with sodium bicarbonate was injected over the area identified by the ultrasound. This area was incised. Micropuncture technique was utilized and the vein was cannulated on a single pass. The wire passed into the great vessel system under fluoroscopic control. It was then changed for the introducer, which was changed for a larger wire. Again fluoroscopy confirmed great vessel placement. A second incision was made on the chest wall for placement of the power port device. A transverse incision was made, carried down through the subcutaneous tissue with Bovie electrocautery. A subcutaneous suprafascial pocket was created without difficulty. Vein dilator and introducer were inserted over the wire and the wire and dilator removed. Heparin-filled catheter was inserted through the introducer into the great vessel system and positioned just above the atrium using Conray. It was then tunneled to the second incision where it was attached to a  heparin-filled Port-A-Cath device. Conray was used to confirm the absence of any kinks or leaks and the appropriate position in the great vessel system. The device was then flushed with heparinized saline. It was sutured to the chest wall with 3-0 silk. The subcutaneous space was obliterated with 3-0 Vicryl and the skin was closed with a running suture of 4-0 nylon. Sterile dressings were applied.   The patient was returned to the recovery room having tolerated the procedure well. Sponge, instrument, and needle counts were correct times two in the operating room.    ____________________________ Rodena Goldmann III, MD rle:bjt D: 11/19/2011 08:20:24 ET T: 11/19/2011 10:15:42 ET JOB#: [id number]  cc: Rodena Goldmann III, MD, <Dictator> [name]. Grayland Ormond, [place]. Genevive Bi, MD Deborra Medina, MD Rodena Goldmann MD ELECTRONICALLY SIGNED 11/19/2011 19:52

## 2014-10-17 NOTE — Consult Note (Signed)
Chief Complaint:   Subjective/Chief Complaint No fever tolerating diet wellHe states to be doing ok.Had some pain with chest tube in place. Tube still draining   VITAL SIGNS/ANCILLARY NOTES: **Vital Signs.:   24-Apr-13 14:26   Vital Signs Type Q 4hr   Temperature Temperature (F) 97.5   Celsius 36.3   Temperature Source oral   Pulse Pulse 82   Pulse source per Dinamap   Respirations Respirations 20   Systolic BP Systolic BP 937   Diastolic BP (mmHg) Diastolic BP (mmHg) 82   Mean BP 98   BP Source Dinamap   Pulse Ox % Pulse Ox % 94   Pulse Ox Activity Level  At rest   Oxygen Delivery Room Air/ 21 %  *Intake and Output.:   Shift 24-Apr-13 07:00   Grand Totals Intake:   Output:  280    Net:  -280 24 Hr.:  -100   JP Drain ml     Out:  280   Brief Assessment:   Cardiac Regular  murmur present  -- JVD  --Gallop    Respiratory normal resp effort  clear BS  rhonchi    Gastrointestinal Normal    Gastrointestinal details normal Soft  Nontender  Nondistended  No masses palpable  Bowel sounds normal   Blood Glucose:  24-Apr-13 07:41    POCT Blood Glucose 142    11:52    POCT Blood Glucose 143    16:49    POCT Blood Glucose 162    20:35    POCT Blood Glucose 109   Radiology Results: XRay:    15-Apr-13 11:47, Chest Portable Single View   Chest Portable Single View    REASON FOR EXAM:    RLL resection  COMMENTS:       PROCEDURE: DXR - DXR PORTABLE CHEST SINGLE VIEW  - Oct 08 2011 11:47AM     RESULT: Comparison: None    Findings:     Single portable AP chest radiograph is provided. There are 2 right-sided   chest tubes in satisfactory position. There is no obvious right   pneumothorax. There is no right pleural effusion. The left lung is clear.   There is evidence of prior median sternotomy. Normal cardiomediastinal   silhouette. The osseous structures are unremarkable.    IMPRESSION:     Two right-sided chest tubes in satisfactory position without  pneumothorax.    Dictation Site: 1          Verified By: Jennette Banker, M.D., MD    16-Apr-13 06:11, Chest Portable Single View   Chest Portable Single View    REASON FOR EXAM:    Assess for Pleural Effusion  COMMENTS:       PROCEDURE: DXR - DXR PORTABLE CHEST SINGLE VIEW  - Oct 09 2011  6:11AM     RESULT: Comparison: None     Findings:     Single portable AP chest radiograph is provided. There are 2 right-sided   chest tubes in satisfactory position. There is no obvious right   pneumothorax. There is no right pleural effusion. Mild left basilar   atelectasis is present. There is evidence of prior median sternotomy.   Normal cardiomediastinal silhouette. The osseous structures are   unremarkable.  IMPRESSION:      Two right-sided chest tubes in satisfactory position without   pneumothorax. No significant left pleural effusion.    Dictation Site: 1          Verified By: Jennette Banker,  M.D., MD    17-Apr-13 06:21, Chest Portable Single View   Chest Portable Single View    REASON FOR EXAM:    Assess for Pleural Effusion  COMMENTS:       PROCEDURE: DXR - DXR PORTABLE CHEST SINGLE VIEW  - Oct 10 2011  6:21AM     RESULT: Comparison is made to the prior exam of 10/09/2011. There is   observed a chest tube with the tip projected near the right apex. A   second chest tube is present with the tip projected over the medial   inferior aspect of the right lung. No pleural effusion or pneumothorax is   seen. The lung fields bilaterally are clear of infiltrate. No acute   changes of the heart or pulmonary vasculature are identified. There is a   trace of subcutaneous emphysema in the soft tissues superior to the right   clavicle and unchanged since the prior exam.    IMPRESSION:   1. No acute changes are identified.      Thank you for the opportunity to contribute to the care of your patient.           Verified By: Dionne Ano WALL, M.D., MD    18-Apr-13 06:37, Chest  Portable Single View   Chest Portable Single View    REASON FOR EXAM:    Assess for Pleural Effusion  COMMENTS:       PROCEDURE: DXR - DXR PORTABLE CHEST SINGLE VIEW  - Oct 11 2011  6:37AM     RESULT:     Comparison is made to the prior exam of 10/10/2011.     There is haziness at the right costophrenicangle compatible with a trace   effusion, atelectasis or post operative reactive change. Multiple right   chest tubes remain present. No pneumothorax is seen. There is minimal   atelectasis at the left base. The left lung otherwise is clear. The heart   is normal. Monitoring electrodes are present.    IMPRESSION:      1.  There is haziness at the right base compatible with a trace effusion,   atelectasis or post operative change.  2.  No definite pneumothorax is seen.  3.  Multiple, right chest tubes are present.  4.  The left lung field is clear.      Thank you for this opportunity to contribute to the care of your patient.           Verified By: Dionne Ano WALL, M.D., MD    19-Apr-13 13:23, Chest PA and Lateral   Chest PA and Lateral    REASON FOR EXAM:    assess for pleural effusion  COMMENTS:       PROCEDURE: DXR - DXR CHEST PA (OR AP) AND LATERAL  - Oct 12 2011  1:23PM     RESULT: Comparison is made to the prior exam of 10/11/2011. The lung   fields are clear. The previously notedincreased density at the right   lung base is no longer seen. Multiple right chest tubes are again noted .   There is a small pneumothorax at the right upper lobe where there is   approximately 1.7 cm separation of the visceral and parietal pleura. The   left lung field is clear. Heart size is normal. Monitoring electrodes are   present.    IMPRESSION:   1. There is a small right upper lobe pneumothorax where there is     approximately 1.7 cm separation  of visceral and parietal pleura.  2. Multiple right chest tubes are observed.  3. The increased density previously observed at the right  base is no   longer seen.          Verified By: Dionne Ano WALL, M.D., MD    22-Apr-13 10:07, Chest PA and Lateral   Chest PA and Lateral    REASON FOR EXAM:    assess for pleural effusion  COMMENTS:       PROCEDURE: DXR - DXR CHEST PA (OR AP) AND LATERAL  - Oct 15 2011 10:07AM     RESULT: Comparison is made to the study of 12 October 2011.    Right chest tube is present with the tip at theright lung apex. The   lungs are fully inflated and clear. The cardiac silhouette is normal.   Cardiac monitoring electrodes are present. 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    IMPRESSION:   1. Two right chest tube remain in place. Another tube appears to be in   the subcutaneous tissues. No acute abnormality or pneumothorax evident.    Dictation Site: 2          Verified By: Sundra Aland, M.D., MD    23-Apr-13 06:43, Chest PA and Lateral   Chest PA and Lateral    REASON FOR EXAM:    assess for pleural effusion  COMMENTS:       PROCEDURE: DXR - DXR CHEST PA (OR AP) AND LATERAL  - Oct 16 2011  6:43AM     RESULT: Is made to the previous exam dated 15 October 2011.    Cardiac monitoring electrodes are present. There appears to be some   increased density at the right lung base. The cardiac silhouette is   normal in size. Sternotomy wires are present. Right-sided chest tubes are   present. Small right-sided pneumothorax is demonstrated. No large   effusion is evident. The left lung appears clear and fully inflated. CABG   markers are present.    IMPRESSION:   1. Two right-sided chest tubes are present. There is a small right-sided   pneumothorax. Right lung base atelectasis appears present.    Dictation Site: 2          Verified By: Sundra Aland, M.D., MD    23-Apr-13 13:00, Chest PA and Lateral   Chest PA and Lateral    REASON FOR EXAM:    assess for pleural effusion  COMMENTS:       PROCEDURE: DXR - DXR CHEST PA (OR AP) AND LATERAL  - Oct 16 2011  1:00PM     RESULT:  Comparison is made to the study of 16 October 2011 at 6:38 a.m. Two   chest tubes remain present. Small right apical pneumothorax persists.   Improved aeration is seen in the right lung base with some minimal   residual atelectasis. The left lung appears fully inflated and clear. The   heart is normal in size. 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    IMPRESSION:   1. No significant interval change. Small right-sided pneumothorax with 2   right chest tubes present. Right lung base atelectasis in the right lower   lobe.  Dictation Site: 2          Verified By: Sundra Aland, M.D., MD   Assessment/Plan:  Invasive Device Daily Assessment of Necessity:   Does the patient currently have any of the following indwelling devices? none   Assessment/Plan:   Assessment IMP SOB improved  Chest wall pain Post op R lung surgery Squamous Cell Ca Lung Chest tube drainging HTN DM Hyperlipidemia COPD CAD    Plan PLAN Pain meds as necessary Agree with Chest tube Hopefully sealing stop leak today SOB stable Increase ambulation Continue Bp meds Agree with Statin therapy DM control Cardiac status stable today Non cardiac chest pain   Electronic Signatures: Lujean Amel D (MD)  (Signed 28-Apr-13 09:36)  Authored: Chief Complaint, VITAL SIGNS/ANCILLARY NOTES, Brief Assessment, Lab Results, Radiology Results, Assessment/Plan   Last Updated: 28-Apr-13 09:36 by Yolonda Kida (MD)

## 2014-10-17 NOTE — Discharge Summary (Signed)
PATIENT NAME:  Nathaniel Lane, Nathaniel Lane MR#:  [id number] DATE OF BIRTH:  Jul 16, 1945  DATE OF ADMISSION:  10/30/2011 DATE OF DISCHARGE:  10/31/2011  ADMITTING DIAGNOSIS: Acute viral gastritis with dehydration.   DISCHARGE DIAGNOSIS: Acute viral gastritis with dehydration.   HOSPITAL COURSE: [name] is a 69 year old gentleman who is three weeks status post right thoracotomy and right lower lobectomy for carcinoma of the lung. He was seen in the clinic on the date of admission with a several-day history of nausea, vomiting, and weakness. He was unable to ambulate safely because of his weakness and could not keep down even liquids. He was, therefore, admitted to the hospital. Once he was admitted to the hospital he underwent intravenous hydration. His laboratory studies were examined and they were found to be basically within normal limits. He did have a chest tube in place and this chest tube was removed. After the chest tube was removed, a repeat chest x-ray demonstrated no significant change in the small apical pneumothorax. The patient remained afebrile. He was able to eat clear liquids and then a regular diet. He was able to ambulate around the nurses' station without difficulty. His wounds were clean and dry at the time of discharge and he was subsequently sent home to follow up with Dr. Grayland Ormond and Dr. Genevive Bi in one week.   DISCHARGE MEDICATIONS:  1. Metformin 500 mg b.i.d.  2. Flomax 0.4 mg daily.  3. Atorvastatin 20 mg daily. 4. Enalapril/hydrochlorothiazide 10 mg/ 25 mg combination 1 tablet daily.   All of his questions were answered. He will have a chest x-ray made prior to his visit.   ____________________________ Lew Dawes. Genevive Bi, MD teo:bjt D:  10/31/2011 12:42:10 ET         T: 10/31/2011 14:11:23 ET         JOB#: [id number]  cc: Christia Reading E. Genevive Bi, MD, <Dictator> Deborra Medina, MD Louis Matte MD ELECTRONICALLY SIGNED 11/13/2011 8:20

## 2014-10-17 NOTE — Consult Note (Signed)
Chief Complaint:   Subjective/Chief Complaint Still has chest tube in place. Pain ok. No ambulate much yet.   VITAL SIGNS/ANCILLARY NOTES: **Vital Signs.:   16-Apr-13 14:00   Vital Signs Type Routine   Temperature Temperature (F) 97.9   Celsius 36.6   Pulse Pulse 92   Respirations Respirations 43   Systolic BP Systolic BP 706   Diastolic BP (mmHg) Diastolic BP (mmHg) 237   Mean BP 116   Pulse Ox % Pulse Ox % 94   Oxygen Delivery Room Air/ 21 %   Pulse Ox Heart Rate 92  *Intake and Output.:   Shift 16-Apr-13 15:00   Grand Totals Intake:  706 Output:      Net:  706 24 Hr.:  706   IV (Primary)      In:  525   IV (Secondary)      In:  125   IV (Secondary)      In:  56   Brief Assessment:   Cardiac Regular  murmur present  -- LE edema  -- JVD    Respiratory normal resp effort  rhonchi    Gastrointestinal Normal    Gastrointestinal details normal Soft  Nontender  Nondistended  No masses palpable   Routine Hem:  16-Apr-13 05:45    WBC (CBC) 16.8   RBC (CBC) 3.99   Hemoglobin (CBC) 10.1   Hematocrit (CBC) 32.5   Platelet Count (CBC) 235   MCV 82   MCH 25.3   MCHC 31.0   RDW 15.4   Neutrophil % 91.5   Lymphocyte % 4.1   Monocyte % 4.3   Eosinophil % 0.0   Basophil % 0.1   Neutrophil # 15.4   Lymphocyte # 0.7   Monocyte # 0.7   Eosinophil # 0.0   Basophil # 0.0  Routine Chem:  16-Apr-13 05:45    Glucose, Serum 298   BUN 19   Creatinine (comp) 1.29   Sodium, Serum 132   Potassium, Serum 4.3   Chloride, Serum 98   CO2, Serum 24   Calcium (Total), Serum 8.7   Anion Gap 10   Osmolality (calc) 278   eGFR (African American) >60   eGFR (Non-African American) 59   Radiology Results: XRay:    15-Apr-13 11:47, Chest Portable Single View   Chest Portable Single View    REASON FOR EXAM:    RLL resection  COMMENTS:       PROCEDURE: DXR - DXR PORTABLE CHEST SINGLE VIEW  - Oct 08 2011 11:47AM     RESULT: Comparison: None    Findings:     Single portable  AP chest radiograph is provided. There are 2 right-sided   chest tubes in satisfactory position. There is no obvious right   pneumothorax. There is no right pleural effusion. The left lung is clear.   There is evidence of prior median sternotomy. Normal cardiomediastinal   silhouette. The osseous structures are unremarkable.    IMPRESSION:     Two right-sided chest tubes in satisfactory position without pneumothorax.    Dictation Site: 1          Verified By: Jennette Banker, M.D., MD    16-Apr-13 06:11, Chest Portable Single View   Chest Portable Single View    REASON FOR EXAM:    Assess for Pleural Effusion  COMMENTS:       PROCEDURE: DXR - DXR PORTABLE CHEST SINGLE VIEW  - Oct 09 2011  6:11AM  RESULT: Comparison: None     Findings:     Single portable AP chest radiograph is provided. There are 2 right-sided   chest tubes in satisfactory position. There is no obvious right   pneumothorax. There is no right pleural effusion. Mild left basilar   atelectasis is present. There is evidence of prior median sternotomy.   Normal cardiomediastinal silhouette. The osseous structures are   unremarkable.  IMPRESSION:      Two right-sided chest tubes in satisfactory position without   pneumothorax. No significant left pleural effusion.    Dictation Site: 1          Verified By: Jennette Banker, M.D., MD   Assessment/Plan:  Invasive Device Daily Assessment of Necessity:   Does the patient currently have any of the following indwelling devices? foley    Indwelling Urinary Catheter no longer required, order entered to discontinue   Assessment/Plan:   Assessment IMP Lung Cancer S/P Lung surgery HTN DM CAD Hyperlipidemia COPD POD x 1    Plan PLAN Advance diet Increase ambulation D/C foley Continue Bp control  Agree with DM control Continue incentive spirometry Agree with Statin therapy Continue DVT prohylaxis   Electronic Signatures: Lujean Amel D (MD)   (Signed 24-Apr-13 10:24)  Authored: Chief Complaint, VITAL SIGNS/ANCILLARY NOTES, Brief Assessment, Lab Results, Radiology Results, Assessment/Plan   Last Updated: 24-Apr-13 10:24 by Yolonda Kida (MD)

## 2014-10-17 NOTE — Consult Note (Signed)
Chief Complaint:   Subjective/Chief Complaint He states to feel better each day . He had some back pain at the surgical site earlier today. Denies cp or sob. Has not ambulated yet.   VITAL SIGNS/ANCILLARY NOTES: **Vital Signs.:   17-Apr-13 10:09   Vital Signs Type Q 4hr   Temperature Temperature (F) 97.7   Celsius 36.5   Temperature Source oral   Pulse Pulse 103   Pulse source per Dinamap   Respirations Respirations 20   Systolic BP Systolic BP 585   Diastolic BP (mmHg) Diastolic BP (mmHg) 69   Mean BP 81   BP Source Dinamap   Pulse Ox % Pulse Ox % 95   Pulse Ox Activity Level  At rest   Oxygen Delivery Room Air/ 21 %  *Intake and Output.:   Daily 17-Apr-13 07:00   Grand Totals Intake:  870 Output:  2570    Net:  -1700 24 Hr.:  -1700   IV (Primary)      In:  675   IV (Secondary)      In:  125   IV (Secondary)      In:  70   Urine ml     Out:  2550   JP Drain ml     Out:  20   Brief Assessment:   Cardiac Regular  murmur present  -- LE edema  -- JVD    Respiratory normal resp effort  rhonchi    Gastrointestinal Normal    Gastrointestinal details normal Soft  Nontender  Nondistended  No masses palpable   Routine Hem:  16-Apr-13 05:45    WBC (CBC) 16.8   RBC (CBC) 3.99   Hemoglobin (CBC) 10.1   Hematocrit (CBC) 32.5   Platelet Count (CBC) 235   MCV 82   MCH 25.3   MCHC 31.0   RDW 15.4   Neutrophil % 91.5   Lymphocyte % 4.1   Monocyte % 4.3   Eosinophil % 0.0   Basophil % 0.1   Neutrophil # 15.4   Lymphocyte # 0.7   Monocyte # 0.7   Eosinophil # 0.0   Basophil # 0.0  Routine Chem:  16-Apr-13 05:45    Glucose, Serum 298   BUN 19   Creatinine (comp) 1.29   Sodium, Serum 132   Potassium, Serum 4.3   Chloride, Serum 98   CO2, Serum 24   Calcium (Total), Serum 8.7   Anion Gap 10   Osmolality (calc) 278   eGFR (African American) >60   eGFR (Non-African American) 59   Radiology Results: XRay:    15-Apr-13 11:47, Chest Portable Single View    Chest Portable Single View    REASON FOR EXAM:    RLL resection  COMMENTS:       PROCEDURE: DXR - DXR PORTABLE CHEST SINGLE VIEW  - Oct 08 2011 11:47AM     RESULT: Comparison: None    Findings:     Single portable AP chest radiograph is provided. There are 2 right-sided   chest tubes in satisfactory position. There is no obvious right   pneumothorax. There is no right pleural effusion. The left lung is clear.   There is evidence of prior median sternotomy. Normal cardiomediastinal   silhouette. The osseous structures are unremarkable.    IMPRESSION:     Two right-sided chest tubes in satisfactory position without pneumothorax.    Dictation Site: 1          Verified By: Jennette Banker,  M.D., MD    16-Apr-13 06:11, Chest Portable Single View   Chest Portable Single View    REASON FOR EXAM:    Assess for Pleural Effusion  COMMENTS:       PROCEDURE: DXR - DXR PORTABLE CHEST SINGLE VIEW  - Oct 09 2011  6:11AM     RESULT: Comparison: None     Findings:     Single portable AP chest radiograph is provided. There are 2 right-sided   chest tubes in satisfactory position. There is no obvious right   pneumothorax. There is no right pleural effusion. Mild left basilar   atelectasis is present. There is evidence of prior median sternotomy.   Normal cardiomediastinal silhouette. The osseous structures are   unremarkable.  IMPRESSION:      Two right-sided chest tubes in satisfactory position without   pneumothorax. No significant left pleural effusion.    Dictation Site: 1          Verified By: Jennette Banker, M.D., MD    17-Apr-13 06:21, Chest Portable Single View   Chest Portable Single View    REASON FOR EXAM:    Assess for Pleural Effusion  COMMENTS:       PROCEDURE: DXR - DXR PORTABLE CHEST SINGLE VIEW  - Oct 10 2011  6:21AM     RESULT: Comparison is made to the prior exam of 10/09/2011. There is   observed a chest tube with the tip projected near the right apex.  A   second chest tube is present with the tip projected over the medial   inferior aspect of the right lung. No pleural effusion or pneumothorax is   seen. The lung fields bilaterally are clear of infiltrate. No acute   changes of the heart or pulmonary vasculature are identified. There is a   trace of subcutaneous emphysema in the soft tissues superior to the right   clavicle and unchanged since the prior exam.    IMPRESSION:   1. No acute changes are identified.      Thank you for the opportunity to contribute to the care of your patient.           Verified By: Dionne Ano WALL, M.D., MD   Assessment/Plan:  Invasive Device Daily Assessment of Necessity:   Does the patient currently have any of the following indwelling devices? foley    Indwelling Urinary Catheter no longer required, order entered to discontinue   Assessment/Plan:   Assessment IMP S/P Lung surgery HTN DM CAD Hyperlipidemia COPD POD x 2    Plan PLAN Increase ambulation D/C foley Continue Bp control  Agree with DM control Continue incentive spirometry Agree with Statin therapy Continue DVT prohylaxis   Electronic Signatures: Lujean Amel D (MD)  (Signed 17-Apr-13 14:42)  Authored: Chief Complaint, VITAL SIGNS/ANCILLARY NOTES, Brief Assessment, Lab Results, Radiology Results, Assessment/Plan   Last Updated: 17-Apr-13 14:42 by Yolonda Kida (MD)

## 2014-10-17 NOTE — Op Note (Signed)
PATIENT NAME:  Nathaniel Lane, Nathaniel Lane MR#:  [id number] DATE OF BIRTH:  07/04/1945  DATE OF PROCEDURE:  10/08/2011  PREOPERATIVE DIAGNOSIS:  Right lower lobe mass.   POSTOPERATIVE DIAGNOSIS:  Right lower lobe mass.  OPERATION PERFORMED:  1. Preoperative bronchoscopy to assess endobronchial anatomy.  2. Right thoracotomy and right lower lobectomy.  3. Mediastinal lymphadenectomy.   SURGEON: Nathaniel Lane, M.D.   ASSISTANT:  Bronson Ing, M.D.  INDICATIONS FOR PROCEDURE: Nathaniel Lane is a 69 year old gentleman with recently discovered right lower lobe mass. Preoperative evaluation revealed adequate pulmonary function. He also had a PET scan which did not reveal any evidence of distant disease. He was seen by our oncologist Dr. Delight Hoh who thought that he was a reasonable candidate for surgical intervention. The indications and risks of the above-named procedure were explained to the patient who gave his informed consent.   DESCRIPTION OF PROCEDURE: The patient was brought to the operating suite and placed in the supine position. General endotracheal anesthesia was given through a double-lumen tube. Preoperative bronchoscopy was carried out. The superior segmental bronchus of the right lower lobe was somewhat angulated but I did not see any tumor there. The rest of the right and left bronchi were normal. I could not see any endobronchial tumor. The patient was then rolled for a right thoracotomy. All pressure points were carefully padded. The patient was prepped and draped in the usual sterile fashion. A muscle-sparing fifth interspace thoracotomy was performed. The latissimus and serratus muscles were retracted and the chest was entered. The inferior pulmonary ligament was divided. We could see that there was a very large mass in the right lower lobe. The fissures between the upper lobe and the lower lobe were created using cautery. Likewise the fissure between the middle lobe and the lower lobe was  developed. We did use a stapling device to finish the fissure both anteriorly and posteriorly. The pulmonary artery was identified in the depths of the wound.  Interestingly, the posterior ascending branch of the right upper lobe came off the superior segment branch of the right lower lobe. In any event, the two large branches were taken with the vascular stapler. The middle lobe bronchus was identified and there were several large lymph nodes that were all taken, some separately and some swept into the specimen. The superior segmental bronchus and the basilar bronchi were each taken separately with a stapling device.  The only remaining structure at this point was the pulmonary vein. It was then encircled and secured with a vascular stapler. The specimen was passed off the field and multiple lymph nodes were taken from the mediastinum. We opened the pretracheal space and could only identify one small lymph node, which we labeled as R4.  The other lymph nodes were labeled appropriately. The bronchus was checked at 30 cm of water pressure and there was no air leak. There was some air leak from the pulmonary parenchyma. Pro Gel was used on the cut surfaces of the lung. Two chest tubes were inserted. The chest was then closed in standard fashion using #2 Vicryl paracostal sutures. The muscles of the chest wall were allowed to return to their normal anatomic position. A subcutaneous drain was placed and the wounds were then closed in multiple layers using running absorbable sutures. The port site was closed with nylon. The patient tolerated the procedure well and was rolled to the supine position where he was extubated and taken to the recovery room in stable condition.  ____________________________ Nathaniel Lane Nathaniel Bi, MD teo:bjt D: 10/08/2011 11:38:24 ET T: 10/08/2011 12:08:47 ET JOB#: [id number]  cc: Christia Reading E. Nathaniel Bi, MD, <Dictator> Nathaniel Lane III, MD Nathaniel November. Grayland Ormond, MD Nathaniel Matte MD ELECTRONICALLY  SIGNED 10/09/2011 11:28

## 2014-10-17 NOTE — Consult Note (Signed)
Chief Complaint:   Subjective/Chief Complaint He states to be doing ok.Had some pain with attempted procedure yesterday. He is scheduled for sealing later today.   VITAL SIGNS/ANCILLARY NOTES: **Vital Signs.:   25-Apr-13 13:46   Temperature Temperature (F) 97.4   Celsius 36.3   Temperature Source oral   Pulse Pulse 88   Respirations Respirations 18   Systolic BP Systolic BP 563   Diastolic BP (mmHg) Diastolic BP (mmHg) 82   Mean BP 104   Pulse Ox % Pulse Ox % 95   Pulse Ox Activity Level  At rest   Oxygen Delivery Room Air/ 21 %  *Intake and Output.:   Shift 25-Apr-13 07:00   Grand Totals Intake:   Output:  660    Net:  -14 24 Hr.:  -420   Chest Tube ml     Out:  660   Brief Assessment:   Cardiac Regular  murmur present  -- JVD  --Gallop    Respiratory normal resp effort  clear BS  rhonchi    Gastrointestinal Normal    Gastrointestinal details normal Soft  Nontender  Nondistended  No masses palpable  Bowel sounds normal   Blood Glucose:  25-Apr-13 07:32    POCT Blood Glucose 128    11:33    POCT Blood Glucose 107    16:21    POCT Blood Glucose 143    20:30    POCT Blood Glucose 141   Radiology Results: XRay:    15-Apr-13 11:47, Chest Portable Single View   Chest Portable Single View    REASON FOR EXAM:    RLL resection  COMMENTS:       PROCEDURE: DXR - DXR PORTABLE CHEST SINGLE VIEW  - Oct 08 2011 11:47AM     RESULT: Comparison: None    Findings:     Single portable AP chest radiograph is provided. There are 2 right-sided   chest tubes in satisfactory position. There is no obvious right   pneumothorax. There is no right pleural effusion. The left lung is clear.   There is evidence of prior median sternotomy. Normal cardiomediastinal   silhouette. The osseous structures are unremarkable.    IMPRESSION:     Two right-sided chest tubes in satisfactory position without pneumothorax.    Dictation Site: 1          Verified By: Jennette Banker,  M.D., MD    16-Apr-13 06:11, Chest Portable Single View   Chest Portable Single View    REASON FOR EXAM:    Assess for Pleural Effusion  COMMENTS:       PROCEDURE: DXR - DXR PORTABLE CHEST SINGLE VIEW  - Oct 09 2011  6:11AM     RESULT: Comparison: None     Findings:     Single portable AP chest radiograph is provided. There are 2 right-sided   chest tubes in satisfactory position. There is no obvious right   pneumothorax. There is no right pleural effusion. Mild left basilar   atelectasis is present. There is evidence of prior median sternotomy.   Normal cardiomediastinal silhouette. The osseous structures are   unremarkable.  IMPRESSION:      Two right-sided chest tubes in satisfactory position without   pneumothorax. No significant left pleural effusion.    Dictation Site: 1          Verified By: Jennette Banker, M.D., MD    17-Apr-13 06:21, Chest Portable Single View   Chest Portable Single View  REASON FOR EXAM:    Assess for Pleural Effusion  COMMENTS:       PROCEDURE: DXR - DXR PORTABLE CHEST SINGLE VIEW  - Oct 10 2011  6:21AM     RESULT: Comparison is made to the prior exam of 10/09/2011. There is   observed a chest tube with the tip projected near the right apex. A   second chest tube is present with the tip projected over the medial   inferior aspect of the right lung. No pleural effusion or pneumothorax is   seen. The lung fields bilaterally are clear of infiltrate. No acute   changes of the heart or pulmonary vasculature are identified. There is a   trace of subcutaneous emphysema in the soft tissues superior to the right   clavicle and unchanged since the prior exam.    IMPRESSION:   1. No acute changes are identified.      Thank you for the opportunity to contribute to the care of your patient.           Verified By: Dionne Ano WALL, M.D., MD    18-Apr-13 06:37, Chest Portable Single View   Chest Portable Single View    REASON FOR EXAM:    Assess for  Pleural Effusion  COMMENTS:       PROCEDURE: DXR - DXR PORTABLE CHEST SINGLE VIEW  - Oct 11 2011  6:37AM     RESULT:     Comparison is made to the prior exam of 10/10/2011.     There is haziness at the right costophrenicangle compatible with a trace   effusion, atelectasis or post operative reactive change. Multiple right   chest tubes remain present. No pneumothorax is seen. There is minimal   atelectasis at the left base. The left lung otherwise is clear. The heart   is normal. Monitoring electrodes are present.    IMPRESSION:      1.  There is haziness at the right base compatible with a trace effusion,   atelectasis or post operative change.  2.  No definite pneumothorax is seen.  3.  Multiple, right chest tubes are present.  4.  The left lung field is clear.      Thank you for this opportunity to contribute to the care of your patient.           Verified By: Dionne Ano WALL, M.D., MD    19-Apr-13 13:23, Chest PA and Lateral   Chest PA and Lateral    REASON FOR EXAM:    assess for pleural effusion  COMMENTS:       PROCEDURE: DXR - DXR CHEST PA (OR AP) AND LATERAL  - Oct 12 2011  1:23PM     RESULT: Comparison is made to the prior exam of 10/11/2011. The lung   fields are clear. The previously notedincreased density at the right   lung base is no longer seen. Multiple right chest tubes are again noted .   There is a small pneumothorax at the right upper lobe where there is   approximately 1.7 cm separation of the visceral and parietal pleura. The   left lung field is clear. Heart size is normal. Monitoring electrodes are   present.    IMPRESSION:   1. There is a small right upper lobe pneumothorax where there is     approximately 1.7 cm separation of visceral and parietal pleura.  2. Multiple right chest tubes are observed.  3. The increased density previously observed  at the right base is no   longer seen.          Verified By: Dionne Ano WALL, M.D., MD     22-Apr-13 10:07, Chest PA and Lateral   Chest PA and Lateral    REASON FOR EXAM:    assess for pleural effusion  COMMENTS:       PROCEDURE: DXR - DXR CHEST PA (OR AP) AND LATERAL  - Oct 15 2011 10:07AM     RESULT: Comparison is made to the study of 12 October 2011.    Right chest tube is present with the tip at theright lung apex. The   lungs are fully inflated and clear. The cardiac silhouette is normal.   Cardiac monitoring electrodes are present. 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    IMPRESSION:   1. Two right chest tube remain in place. Another tube appears to be in   the subcutaneous tissues. No acute abnormality or pneumothorax evident.    Dictation Site: 2          Verified By: Sundra Aland, M.D., MD    23-Apr-13 06:43, Chest PA and Lateral   Chest PA and Lateral    REASON FOR EXAM:    assess for pleural effusion  COMMENTS:       PROCEDURE: DXR - DXR CHEST PA (OR AP) AND LATERAL  - Oct 16 2011  6:43AM     RESULT: Is made to the previous exam dated 15 October 2011.    Cardiac monitoring electrodes are present. There appears to be some   increased density at the right lung base. The cardiac silhouette is   normal in size. Sternotomy wires are present. Right-sided chest tubes are   present. Small right-sided pneumothorax is demonstrated. No large   effusion is evident. The left lung appears clear and fully inflated. CABG   markers are present.    IMPRESSION:   1. Two right-sided chest tubes are present. There is a small right-sided   pneumothorax. Right lung base atelectasis appears present.    Dictation Site: 2          Verified By: Sundra Aland, M.D., MD    23-Apr-13 13:00, Chest PA and Lateral   Chest PA and Lateral    REASON FOR EXAM:    assess for pleural effusion  COMMENTS:       PROCEDURE: DXR - DXR CHEST PA (OR AP) AND LATERAL  - Oct 16 2011  1:00PM     RESULT: Comparison is made to the study of 16 October 2011 at 6:38 a.m. Two   chest tubes remain  present. Small right apical pneumothorax persists.   Improved aeration is seen in the right lung base with some minimal   residual atelectasis. The left lung appears fully inflated and clear. The   heart is normal in size. 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    IMPRESSION:   1. No significant interval change. Small right-sided pneumothorax with 2   right chest tubes present. Right lung base atelectasis in the right lower   lobe.  Dictation Site: 2          Verified By: Sundra Aland, M.D., MD    25-Apr-13 06:13, Chest Portable Single View   Chest Portable Single View    REASON FOR EXAM:    assess for pleural effusion  COMMENTS:       PROCEDURE: DXR - DXR PORTABLE CHEST SINGLE VIEW  - Oct 18 2011  6:13AM  RESULT: A right chest tube is noted. No evidence of pneumothorax. Prior   CABG is noted. The lungs are clear. The cardiovascular structures are   unremarkable.    IMPRESSION:  Stable chest with chest tube in stable position. Minimal   apical pneumothorax may be present on today's examination. Pneumothorax   has improved from 10/16/2011.      Thank you for the opportunity to contribute to the care of your patient.     Verified By: Osa Craver, M.D., MD   Assessment/Plan:  Invasive Device Daily Assessment of Necessity:   Does the patient currently have any of the following indwelling devices? none   Assessment/Plan:   Assessment IMP Post op R lung surgery Squamous Cell Ca Lung Chest tube drainging HTN DM Hyperlipidemia COPD CAD    Plan PLAN Agree with Chest tube Hopefully sealing stop leak today SOB stable Increase ambulation Continue Bp meds Agree with Statin therapy DM control Cardiac status stable today   Electronic Signatures: Lujean Amel D (MD)  (Signed 26-Apr-13 12:51)  Authored: Chief Complaint, VITAL SIGNS/ANCILLARY NOTES, Brief Assessment, Lab Results, Radiology Results, Assessment/Plan   Last Updated: 26-Apr-13 12:51 by Yolonda Kida (MD)

## 2014-10-17 NOTE — H&P (Signed)
PATIENT NAME:  Nathaniel Lane, Nathaniel Lane MR#:  [id number] DATE OF BIRTH:  [birth year]  DATE OF ADMISSION:  10/30/2011  ADMITTING DIAGNOSIS: Dehydration.   HISTORY OF PRESENT ILLNESS: [name] is a 69 year old gentleman who is status post right thoracotomy and right lower lobectomy for a carcinoma of the lung. His procedure was performed on 04/15 and his postoperative course was complicated by a prolonged air leak. He was ultimately discharged to home and followed up in the outpatient department. Over the last 3 to 4 days both he and his wife have been sick at home. They have both had episodes of nausea and vomiting. Neither have had diarrhea or fever, however. Yesterday Nathaniel Lane states that he was unable to keep down any food including liquids such as water and Gatorade. He fell at home twice because of his severe weakness. He felt warm but did not take his temperature. He called this morning and we saw him in the office today. He does appear somewhat pale and because of his inability to keep down any solids or liquids we admitted to the hospital at this time for work-up and evaluation.   Nathaniel Lane has a chest tube that is in place. This has been managed as an outpatient. Today he does not have an air leak from his tube.   MEDICATIONS: His medications which he has been unable to take because of his nausea and vomiting include:  1. Metformin 500 mg b.i.d. 2. Flomax 0.4 mg daily. 3. Atorvastatin 20 mg daily. 4. Enalapril/hydrochlorothiazide 10 mg/25 mg combination 1 tablet daily.   ALLERGIES: He has an allergy to penicillin.   PAST MEDICAL HISTORY:  1. Coronary bypass surgery in 2006.  2. Myocardial infarction. 3. Diabetes. 4. Hypertension. 5. Hyperlipidemia. 6. Gout.  7. Status post right lower lobectomy three weeks ago for carcinoma of the lung and is currently being followed by Dr. Delight Hoh.   SOCIAL HISTORY: He is married. He has two children. He has worked at various jobs over the years  and smoked for many years but quit six years ago prior to his coronary bypass. He is a social alcohol drinker.   FAMILY HISTORY: Both his mother and father are still living. He states that his mother is in good health and his father has a history of depression. There is no family history of malignancy.   REVIEW OF SYSTEMS: As per history of present illness and all other review of systems were asked and were otherwise negative.   PHYSICAL EXAMINATION:  GENERAL: Pleasant, well-developed, well-nourished gentleman in no acute distress. He did appear somewhat pale but not markedly so. His mucous membranes are moist.   HEENT: Exam revealed the head to be normocephalic. The pupils are equal. They were anicteric.   NECK: His neck was supple without thyromegaly or adenopathy.   LUNGS: His lungs show diminished breath sounds on the right base. His thoracotomy wound was well healed. He has one remaining chest tube in place. The site of the chest tube looked slightly erythematous but there was no purulent drainage. There is no air leak from the tube today.   HEART: Regular. His midline sternotomy scar is well healed.   ABDOMEN: Soft, nontender, nondistended. There were normal bowel sounds throughout.   EXTREMITIES: His extremities were without clubbing, cyanosis, or edema.   ASSESSMENT AND PLAN: Nathaniel Lane is admitted at the present time for management of his nausea, vomiting and dehydration. In addition, we will obtain a chest x-ray today to look  at his pneumothorax and air leak.    We will consult the hospitalist service for assistance in management for his diabetes and hypertension.   ____________________________ Lew Dawes Genevive Bi, MD teo:cms D: 10/30/2011 12:04:29 ET T: 10/30/2011 12:21:07 ET JOB#: [id number]  cc: Christia Reading E. Genevive Bi, MD, <Dictator> Deborra Medina, MD Louis Matte MD ELECTRONICALLY SIGNED 10/31/2011 8:12

## 2014-10-17 NOTE — Consult Note (Signed)
PATIENT NAME:  Nathaniel Lane, Nathaniel Lane MR#:  [id number] DATE OF BIRTH:  [birth year]  DATE OF CONSULTATION:  10/30/2011  REFERRING PHYSICIAN:  Dr. Genevive Bi  CONSULTING PHYSICIAN:  Theodoro Grist, MD  PRIMARY CARE PHYSICIAN: Dr. Derrel Nip   REASON FOR CONSULTATION: Medical management of patient's diabetes as well as hypertension.   HISTORY OF PRESENT ILLNESS: Patient is a 69 year old Caucasian male with past medical history significant for history of right lower lobe lobectomy for lung carcinoma on 10/08/2011 with postoperative course which was complicated by prolonged air leak. He was discharged approximately five days ago from the hospital and returned back home. Over the past 2 to 3 days three he was having problems with nausea and vomiting, however, no abdominal pain or diarrhea were noted. Patient also did note any fevers or chills He felt progressively weaker and decided to come to Dr. Genevive Bi' office earlier than previously scheduled where he was noted to be dehydrated and admitted to the hospital for further evaluation and therapy. Consult was requested in regards to medical management. Patient himself states that he is still kind of nauseous, however, over the past few days he was having problems with any kind of food intake. He stated that even a little bite of food would result in nausea and vomiting. He denies any change of his stool. He stated that in the past he would have bowel movements every day, however, since he was admitted to the hospital for right lung carcinoma surgical therapy he has been having bowel movements every second day, however, denies any significant discomfort in his abdomen. He stated he felt somewhat feverish and burning last night. He also fell down last night and felt somewhat presyncopal. He denied any changes in his urine. Denies any dark or cloudy urine. He felt somewhat weak and over the past months he lost approximately 15 pounds. His blood glucose levels even though he did not eat much  were ranging between 110 to 135. Here in the hospital his blood glucose level was noted to be 126 on admission and 166 now in the afternoon.   PAST MEDICAL HISTORY:  1. Hypertension.  2. Benign prostatic hypertrophy. 3. Coronary artery disease, status post non-Q-wave myocardial infarction in 2007.  4. History of pneumonia, at that time patient had intubation as well as thoracentesis due to pneumonia.  5. History of tobacco abuse, quit in 2007. 6. History of coronary artery bypass graft in 2006. 7. Myocardial infarction. 8. Diabetes. 9. Hypertension. 10. Hyperlipidemia. 11. Gout. 12. History of right lower lobe lobectomy three weeks ago for carcinoma of the lung and currently being followed by Dr. Grayland Ormond.   HOME MEDICATIONS: Patient's medications at home as follows: 1. Metformin 500 mg p.o. twice daily. 2. Flomax 0.4 mg p.o. daily.  3. Atorvastatin 10 mg p.o. daily.  4. Enalapril/hydrochlorothiazide 10/25 mg once daily.   INPATIENT MEDICATIONS: Here in the hospital patient's medication list is as follows: 1. Zofran 4 mg IV every four hours as needed. 2. Dextrose 5% with half normal saline at 100 mL/h.    ALLERGIES: Patient is allergic to penicillin.   SOCIAL HISTORY: Patient is married, has two children. He used to work various jobs over years. He used to smoke, however, quit approximately 6 or 7 years ago prior to his coronary artery bypass. He is a social alcohol drinker according to medical records.   FAMILY HISTORY: Patient's mother and father are living. Patient's mother is in good health, however, patient's father has history of depression. No family  history of malignancy.   REVIEW OF SYSTEMS: CONSTITUTIONAL: Patient admits of feeling cold all the time, somewhat weak, loss of weight, 15 pounds in the past one month. Otherwise, denies any chills. Denies any significant weakness or problems with diaphoresis. EYES: Denies any eye problems such as blurry vision, double vision,  glaucoma, cataracts. ENT: Denies any tinnitus, allergies, epistaxis, sinus pain, dentures, difficulty swallowing. RESPIRATORY: Denies any cough, wheezes, asthma, chronic obstructive pulmonary disease. CARDIOVASCULAR: Denies chest pains, orthopnea, palpitations, or syncope, however, admits of fall. GASTROINTESTINAL: Admits of nausea, vomiting. Denies any diarrhea, constipation. Denies any hematemesis or rectal bleeding, change in bowel habits. GENITOURINARY: Denies dysuria, hematuria, frequency, incontinence. ENDOCRINOLOGY: Denies any polydipsia, nocturia, thyroid problems, heat or cold intolerance, or thirst. Admits of having blood glucose levels recently around 110 to 135. MUSCULOSKELETAL: Denies any cramps, swelling. SKIN: Denies any acne, lesions, changes in moles. NEUROLOGICAL: Denies any weakness, tremors. PSYCH: Denies anxiety, insomnia, or depression.   PHYSICAL EXAMINATION:  VITAL SIGNS: On arrival to the hospital patient's vital signs: Temperature 97.6, pulse 81, respiration rate 18, blood pressure 120/80, saturation 98% on room air.   GENERAL: This is a well developed, well nourished, somewhat thin Caucasian male in no significant distress, lying on the stretcher.   HEENT: His pupils are equal, reactive to light. Extraocular movements are intact. No icterus, conjunctivitis. Has normal hearing. No pharyngeal erythema. Mucosa is moist.   NECK: No masses. Supple, nontender. Thyroid not nontender. No adenopathy. No JVD or carotid bruits bilaterally. Full range of motion.   LUNGS: Diminished breath sounds in right. Patient's thoracotomy wound is covered with dressing. Chest tube is covered with dressing. I did not look at the chest tube entry site but according to Dr. Genevive Bi' note it was slightly erythematous, however, there was no purulent drainage. There was no air leak from tube.   CARDIOVASCULAR: S1, S2 appreciated. No murmurs, gallops or rubs noted. PMI not lateralized. Chest is nontender to  palpation. It was regular sounds. Patient's midline sternotomy scar was well healed.   ABDOMEN: Soft, nontender. Bowel sounds are present. No hepatosplenomegaly or masses were noted.  RECTAL: Deferred.  MUSCULOSKELETAL: Able to move all extremities. No cyanosis, degenerative joint disease, or kyphosis. Gait is not tested.   SKIN: Did not reveal any rashes, lesions, erythema, nodularity, induration. It was warm and dry to palpation.   LYMPH: No adenopathy in cervical region.   NEUROLOGICAL: Cranial nerves grossly intact. Sensory is intact. No dysarthria, aphasia.   PSYCH: Patient is alert and oriented to time, person, place. Cooperative. Memory is good. No significant confusion, agitation, or depression noted.   LABORATORY, DIAGNOSTIC, AND RADIOLOGICAL DATA: BMP showed glucose 126, creatinine 1.34, sodium 135, otherwise BMP is unremarkable. Patient's albumin level is 3.0. Patient's white cell count is elevated to 12.0, hemoglobin 12.2, platelet count 254, absolute neutrophil count is slightly elevated to 8.7. EKG is pending. Chest PA and lateral 10/30/2011 showed minimal right apical pneumothorax. Both chest tubes were noted to be in right place and stable in position. Atelectasis of right lung base has improved according to radiologist and no pleural effusion was noted.   ASSESSMENT AND PLAN:  1. Nausea, vomiting, likely viral disease. Will continue patient on IV fluids as well as antiemetics and supportive therapy.  2. Dehydration and renal insufficiency due to dehydration. Continue IV fluids as above. Follow patient's creatinine.  3. Diabetes mellitus. Patient's p.o. intake is poor. Will start patient on sliding scale insulin and will hold his diabetic medication metformin, will  adjust according to p.o. intake.  4. Hypertension. Hold blood pressure medications as patient is not hypertensive at this time. Restart whenever patient's blood pressure is high.  5. History of coronary artery  disease. Get EKG, however, no chest pain is reported.  6. Hyperlipidemia. Will hold p.o. medications until he is able to take p.o.  7. Gout. Patient is asymptomatic. Will follow. 8. Status post right lower lobe lobectomy for lung carcinoma. Chest tube will be removed by Dr. Genevive Bi according to patient and chest x-ray shows minimal pneumothorax. Will follow patient's lung condition. Patient is asymptomatic. No sputum production was recorded.  9. Renal insufficiency. As above will continue patient on IV fluids. Will follow the patient's creatinine. Get urinalysis.  10. Leukocytosis. Will follow patient's white blood cell count. Patient is off antibiotic at this time.   TIME SPENT: 50 minutes.  ____________________________ Theodoro Grist, MD rv:cms D: 10/30/2011 16:59:29 ET T: 10/31/2011 09:51:55 ET  JOB#: [id number] cc: Deborra Medina, MD Conrad MD ELECTRONICALLY SIGNED 11/03/2011 17:02

## 2014-10-17 NOTE — Consult Note (Signed)
Chief Complaint:   Subjective/Chief Complaint Tolerated procedure well yesterday. Pain much improved. Resting quietly.Denies cp sob or palps. Chest tube still draining.   VITAL SIGNS/ANCILLARY NOTES: **Vital Signs.:   26-Apr-13 14:58   Vital Signs Type Routine   Temperature Temperature (F) 97.8   Celsius 36.5   Temperature Source oral   Pulse Pulse 86   Pulse source per Dinamap   Respirations Respirations 18   Systolic BP Systolic BP 001   Diastolic BP (mmHg) Diastolic BP (mmHg) 77   Mean BP 95   BP Source Dinamap   Pulse Ox % Pulse Ox % 94   Pulse Ox Activity Level  At rest   Oxygen Delivery Room Air/ 21 %  *Intake and Output.:   Shift 26-Apr-13 15:00   Grand Totals Intake:  120 Output:      Net:  120 24 Hr.:  120   Oral Intake      In:  120   Brief Assessment:   Cardiac Regular  murmur present  -- JVD  --Gallop    Respiratory normal resp effort  clear BS  rhonchi    Gastrointestinal Normal    Gastrointestinal details normal Soft  Nontender  Nondistended  No masses palpable  Bowel sounds normal    Additional Physical Exam Chest in right lung   Blood Glucose:  25-Apr-13 07:32    POCT Blood Glucose 128    11:33    POCT Blood Glucose 107    16:21    POCT Blood Glucose 143    20:30    POCT Blood Glucose 141   Radiology Results: XRay:    15-Apr-13 11:47, Chest Portable Single View   Chest Portable Single View    REASON FOR EXAM:    RLL resection  COMMENTS:       PROCEDURE: DXR - DXR PORTABLE CHEST SINGLE VIEW  - Oct 08 2011 11:47AM     RESULT: Comparison: None    Findings:     Single portable AP chest radiograph is provided. There are 2 right-sided   chest tubes in satisfactory position. There is no obvious right   pneumothorax. There is no right pleural effusion. The left lung is clear.   There is evidence of prior median sternotomy. Normal cardiomediastinal   silhouette. The osseous structures are unremarkable.    IMPRESSION:     Two right-sided  chest tubes in satisfactory position without pneumothorax.    Dictation Site: 1          Verified By: Jennette Banker, M.D., MD    16-Apr-13 06:11, Chest Portable Single View   Chest Portable Single View    REASON FOR EXAM:    Assess for Pleural Effusion  COMMENTS:       PROCEDURE: DXR - DXR PORTABLE CHEST SINGLE VIEW  - Oct 09 2011  6:11AM     RESULT: Comparison: None     Findings:     Single portable AP chest radiograph is provided. There are 2 right-sided   chest tubes in satisfactory position. There is no obvious right   pneumothorax. There is no right pleural effusion. Mild left basilar   atelectasis is present. There is evidence of prior median sternotomy.   Normal cardiomediastinal silhouette. The osseous structures are   unremarkable.  IMPRESSION:      Two right-sided chest tubes in satisfactory position without   pneumothorax. No significant left pleural effusion.    Dictation Site: 1  Verified By: Jennette Banker, M.D., MD    17-Apr-13 06:21, Chest Portable Single View   Chest Portable Single View    REASON FOR EXAM:    Assess for Pleural Effusion  COMMENTS:       PROCEDURE: DXR - DXR PORTABLE CHEST SINGLE VIEW  - Oct 10 2011  6:21AM     RESULT: Comparison is made to the prior exam of 10/09/2011. There is   observed a chest tube with the tip projected near the right apex. A   second chest tube is present with the tip projected over the medial   inferior aspect of the right lung. No pleural effusion or pneumothorax is   seen. The lung fields bilaterally are clear of infiltrate. No acute   changes of the heart or pulmonary vasculature are identified. There is a   trace of subcutaneous emphysema in the soft tissues superior to the right   clavicle and unchanged since the prior exam.    IMPRESSION:   1. No acute changes are identified.      Thank you for the opportunity to contribute to the care of your patient.           Verified By: Dionne Ano  WALL, M.D., MD    18-Apr-13 06:37, Chest Portable Single View   Chest Portable Single View    REASON FOR EXAM:    Assess for Pleural Effusion  COMMENTS:       PROCEDURE: DXR - DXR PORTABLE CHEST SINGLE VIEW  - Oct 11 2011  6:37AM     RESULT:     Comparison is made to the prior exam of 10/10/2011.     There is haziness at the right costophrenicangle compatible with a trace   effusion, atelectasis or post operative reactive change. Multiple right   chest tubes remain present. No pneumothorax is seen. There is minimal   atelectasis at the left base. The left lung otherwise is clear. The heart   is normal. Monitoring electrodes are present.    IMPRESSION:      1.  There is haziness at the right base compatible with a trace effusion,   atelectasis or post operative change.  2.  No definite pneumothorax is seen.  3.  Multiple, right chest tubes are present.  4.  The left lung field is clear.      Thank you for this opportunity to contribute to the care of your patient.           Verified By: Dionne Ano WALL, M.D., MD    19-Apr-13 13:23, Chest PA and Lateral   Chest PA and Lateral    REASON FOR EXAM:    assess for pleural effusion  COMMENTS:       PROCEDURE: DXR - DXR CHEST PA (OR AP) AND LATERAL  - Oct 12 2011  1:23PM     RESULT: Comparison is made to the prior exam of 10/11/2011. The lung   fields are clear. The previously notedincreased density at the right   lung base is no longer seen. Multiple right chest tubes are again noted .   There is a small pneumothorax at the right upper lobe where there is   approximately 1.7 cm separation of the visceral and parietal pleura. The   left lung field is clear. Heart size is normal. Monitoring electrodes are   present.    IMPRESSION:   1. There is a small right upper lobe pneumothorax where there is  approximately 1.7 cm separation of visceral and parietal pleura.  2. Multiple right chest tubes are observed.  3. The increased  density previously observed at the right base is no   longer seen.          Verified By: Dionne Ano WALL, M.D., MD    22-Apr-13 10:07, Chest PA and Lateral   Chest PA and Lateral    REASON FOR EXAM:    assess for pleural effusion  COMMENTS:       PROCEDURE: DXR - DXR CHEST PA (OR AP) AND LATERAL  - Oct 15 2011 10:07AM     RESULT: Comparison is made to the study of 12 October 2011.    Right chest tube is present with the tip at theright lung apex. The   lungs are fully inflated and clear. The cardiac silhouette is normal.   Cardiac monitoring electrodes are present. 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    IMPRESSION:   1. Two right chest tube remain in place. Another tube appears to be in   the subcutaneous tissues. No acute abnormality or pneumothorax evident.    Dictation Site: 2          Verified By: Sundra Aland, M.D., MD    23-Apr-13 06:43, Chest PA and Lateral   Chest PA and Lateral    REASON FOR EXAM:    assess for pleural effusion  COMMENTS:       PROCEDURE: DXR - DXR CHEST PA (OR AP) AND LATERAL  - Oct 16 2011  6:43AM     RESULT: Is made to the previous exam dated 15 October 2011.    Cardiac monitoring electrodes are present. There appears to be some   increased density at the right lung base. The cardiac silhouette is   normal in size. Sternotomy wires are present. Right-sided chest tubes are   present. Small right-sided pneumothorax is demonstrated. No large   effusion is evident. The left lung appears clear and fully inflated. CABG   markers are present.    IMPRESSION:   1. Two right-sided chest tubes are present. There is a small right-sided   pneumothorax. Right lung base atelectasis appears present.    Dictation Site: 2          Verified By: Sundra Aland, M.D., MD    23-Apr-13 13:00, Chest PA and Lateral   Chest PA and Lateral    REASON FOR EXAM:    assess for pleural effusion  COMMENTS:       PROCEDURE: DXR - DXR CHEST PA (OR AP) AND LATERAL  -  Oct 16 2011  1:00PM     RESULT: Comparison is made to the study of 16 October 2011 at 6:38 a.m. Two   chest tubes remain present. Small right apical pneumothorax persists.   Improved aeration is seen in the right lung base with some minimal   residual atelectasis. The left lung appears fully inflated and clear. The   heart is normal in size. 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    IMPRESSION:   1. No significant interval change. Small right-sided pneumothorax with 2   right chest tubes present. Right lung base atelectasis in the right lower   lobe.  Dictation Site: 2          Verified By: Sundra Aland, M.D., MD    25-Apr-13 06:13, Chest Portable Single View   Chest Portable Single View    REASON FOR EXAM:    assess for pleural effusion  COMMENTS:       PROCEDURE: DXR - DXR PORTABLE CHEST SINGLE VIEW  - Oct 18 2011  6:13AM     RESULT: A right chest tube is noted. No evidence of pneumothorax. Prior   CABG is noted. The lungs are clear. The cardiovascular structures are   unremarkable.    IMPRESSION:  Stable chest with chest tube in stable position. Minimal   apical pneumothorax may be present on today's examination. Pneumothorax   has improved from 10/16/2011.      Thank you for the opportunity to contribute to the care of your patient.     Verified By: Osa Craver, M.D., MD    26-Apr-13 06:32, Chest Portable Single View   Chest Portable Single View    REASON FOR EXAM:    Assess for Pleural Effusion  COMMENTS:       PROCEDURE: DXR - DXR PORTABLE CHEST SINGLE VIEW  - Oct 19 2011  6:32AM     RESULT: Comparison: 10/18/2011    Findings:  Heart and mediastinum are stable. Right-sided chest tubes remain in   place. No definite pneumothorax seen. Mild right basilar opacities are   similar to prior and likely secondary to atelectasis. The left lung is   clear.    IMPRESSION:   Unchanged chest radiograph, with mild right basilar opacities that likely     represent  atelectasis.          Verified By: Gregor Hams, M.D., MD   Assessment/Plan:  Invasive Device Daily Assessment of Necessity:   Does the patient currently have any of the following indwelling devices? none   Assessment/Plan:   Assessment IMP Continued draining of chest tube Post op R lung surgery Squamous Cell Ca Lung Chest tube drainging HTN DM Hyperlipidemia COPD CAD    Plan PLAN Encourage PO intake Advance activity as much as possible Agree with Chest tube Hopefully sealing stop leak  SOB stable Increase ambulation Continue Bp meds Agree with Statin therapy DM control Cardiac status stable today   Electronic Signatures: Lujean Amel D (MD)  (Signed 27-Apr-13 06:27)  Authored: Chief Complaint, VITAL SIGNS/ANCILLARY NOTES, Brief Assessment, Lab Results, Radiology Results, Assessment/Plan   Last Updated: 27-Apr-13 06:27 by Yolonda Kida (MD)

## 2014-10-17 NOTE — Consult Note (Signed)
Chief Complaint:   Subjective/Chief Complaint Pt doing well today . Chest tube out. Ambulating well. No significant cp.   VITAL SIGNS/ANCILLARY NOTES: **Vital Signs.:   30-Apr-13 10:14   Vital Signs Type Q 4hr   Temperature Temperature (F) 98.1   Celsius 36.7   Temperature Source oral   Pulse Pulse 91   Pulse source per Dinamap   Respirations Respirations 18   Systolic BP Systolic BP 355   Diastolic BP (mmHg) Diastolic BP (mmHg) 74   Mean BP 93   BP Source Dinamap   Pulse Ox % Pulse Ox % 96   Pulse Ox Activity Level  At rest   Oxygen Delivery Room Air/ 21 %  *Intake and Output.:   Daily 30-Apr-13 07:00   Grand Totals Intake:  960 Output:  160    Net:  800 24 Hr.:  800   Oral Intake      In:  960   Chest Tube ml     Out:  160   Brief Assessment:   Cardiac Regular  murmur present  -- JVD  --Gallop    Respiratory normal resp effort  clear BS  rhonchi    Gastrointestinal Normal    Gastrointestinal details normal Soft  Nontender  Nondistended  No masses palpable  Bowel sounds normal   Blood Glucose:  29-Apr-13 07:23    POCT Blood Glucose 127    11:35    POCT Blood Glucose 122    16:25    POCT Blood Glucose 108    20:12    POCT Blood Glucose 73   Radiology Results: XRay:    16-Apr-13 06:11, Chest Portable Single View   Chest Portable Single View    REASON FOR EXAM:    Assess for Pleural Effusion  COMMENTS:       PROCEDURE: DXR - DXR PORTABLE CHEST SINGLE VIEW  - Oct 09 2011  6:11AM     RESULT: Comparison: None     Findings:     Single portable AP chest radiograph is provided. There are 2 right-sided   chest tubes in satisfactory position. There is no obvious right   pneumothorax. There is no right pleural effusion. Mild left basilar   atelectasis is present. There is evidence of prior median sternotomy.   Normal cardiomediastinal silhouette. The osseous structures are   unremarkable.  IMPRESSION:      Two right-sided chest tubes in satisfactory  position without   pneumothorax. No significant left pleural effusion.    Dictation Site: 1          Verified By: Jennette Banker, M.D., MD    17-Apr-13 06:21, Chest Portable Single View   Chest Portable Single View    REASON FOR EXAM:    Assess for Pleural Effusion  COMMENTS:       PROCEDURE: DXR - DXR PORTABLE CHEST SINGLE VIEW  - Oct 10 2011  6:21AM     RESULT: Comparison is made to the prior exam of 10/09/2011. There is   observed a chest tube with the tip projected near the right apex. A   second chest tube is present with the tip projected over the medial   inferior aspect of the right lung. No pleural effusion or pneumothorax is   seen. The lung fields bilaterally are clear of infiltrate. No acute   changes of the heart or pulmonary vasculature are identified. There is a   trace of subcutaneous emphysema in the soft tissues superior to the right  clavicle and unchanged since the prior exam.    IMPRESSION:   1. No acute changes are identified.      Thank you for the opportunity to contribute to the care of your patient.           Verified By: Dionne Ano WALL, M.D., MD    18-Apr-13 06:37, Chest Portable Single View   Chest Portable Single View    REASON FOR EXAM:    Assess for Pleural Effusion  COMMENTS:       PROCEDURE: DXR - DXR PORTABLE CHEST SINGLE VIEW  - Oct 11 2011  6:37AM     RESULT:     Comparison is made to the prior exam of 10/10/2011.     There is haziness at the right costophrenicangle compatible with a trace   effusion, atelectasis or post operative reactive change. Multiple right   chest tubes remain present. No pneumothorax is seen. There is minimal   atelectasis at the left base. The left lung otherwise is clear. The heart   is normal. Monitoring electrodes are present.    IMPRESSION:      1.  There is haziness at the right base compatible with a trace effusion,   atelectasis or post operative change.  2.  No definite pneumothorax is seen.  3.   Multiple, right chest tubes are present.  4.  The left lung field is clear.      Thank you for this opportunity to contribute to the care of your patient.           Verified By: Dionne Ano WALL, M.D., MD    19-Apr-13 13:23, Chest PA and Lateral   Chest PA and Lateral    REASON FOR EXAM:    assess for pleural effusion  COMMENTS:       PROCEDURE: DXR - DXR CHEST PA (OR AP) AND LATERAL  - Oct 12 2011  1:23PM     RESULT: Comparison is made to the prior exam of 10/11/2011. The lung   fields are clear. The previously notedincreased density at the right   lung base is no longer seen. Multiple right chest tubes are again noted .   There is a small pneumothorax at the right upper lobe where there is   approximately 1.7 cm separation of the visceral and parietal pleura. The   left lung field is clear. Heart size is normal. Monitoring electrodes are   present.    IMPRESSION:   1. There is a small right upper lobe pneumothorax where there is     approximately 1.7 cm separation of visceral and parietal pleura.  2. Multiple right chest tubes are observed.  3. The increased density previously observed at the right base is no   longer seen.          Verified By: Dionne Ano WALL, M.D., MD    22-Apr-13 10:07, Chest PA and Lateral   Chest PA and Lateral    REASON FOR EXAM:    assess for pleural effusion  COMMENTS:       PROCEDURE: DXR - DXR CHEST PA (OR AP) AND LATERAL  - Oct 15 2011 10:07AM     RESULT: Comparison is made to the study of 12 October 2011.    Right chest tube is present with the tip at theright lung apex. The   lungs are fully inflated and clear. The cardiac silhouette is normal.   Cardiac monitoring electrodes are present. 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  IMPRESSION:   1. Two right chest tube remain in place. Another tube appears to be in   the subcutaneous tissues. No acute abnormality or pneumothorax evident.    Dictation Site: 2          Verified By: Sundra Aland,  M.D., MD    23-Apr-13 06:43, Chest PA and Lateral   Chest PA and Lateral    REASON FOR EXAM:    assess for pleural effusion  COMMENTS:       PROCEDURE: DXR - DXR CHEST PA (OR AP) AND LATERAL  - Oct 16 2011  6:43AM     RESULT: Is made to the previous exam dated 15 October 2011.    Cardiac monitoring electrodes are present. There appears to be some   increased density at the right lung base. The cardiac silhouette is   normal in size. Sternotomy wires are present. Right-sided chest tubes are   present. Small right-sided pneumothorax is demonstrated. No large   effusion is evident. The left lung appears clear and fully inflated. CABG   markers are present.    IMPRESSION:   1. Two right-sided chest tubes are present. There is a small right-sided   pneumothorax. Right lung base atelectasis appears present.    Dictation Site: 2          Verified By: Sundra Aland, M.D., MD    23-Apr-13 13:00, Chest PA and Lateral   Chest PA and Lateral    REASON FOR EXAM:    assess for pleural effusion  COMMENTS:       PROCEDURE: DXR - DXR CHEST PA (OR AP) AND LATERAL  - Oct 16 2011  1:00PM     RESULT: Comparison is made to the study of 16 October 2011 at 6:38 a.m. Two   chest tubes remain present. Small right apical pneumothorax persists.   Improved aeration is seen in the right lung base with some minimal   residual atelectasis. The left lung appears fully inflated and clear. The   heart is normal in size. 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    IMPRESSION:   1. No significant interval change. Small right-sided pneumothorax with 2   right chest tubes present. Right lung base atelectasis in the right lower   lobe.  Dictation Site: 2          Verified By: Sundra Aland, M.D., MD    25-Apr-13 06:13, Chest Portable Single View   Chest Portable Single View    REASON FOR EXAM:    assess for pleural effusion  COMMENTS:       PROCEDURE: DXR - DXR PORTABLE CHEST SINGLE VIEW  - Oct 18 2011  6:13AM      RESULT: A right chest tube is noted. No evidence of pneumothorax. Prior   CABG is noted. The lungs are clear. The cardiovascular structures are   unremarkable.    IMPRESSION:  Stable chest with chest tube in stable position. Minimal   apical pneumothorax may be present on today's examination. Pneumothorax   has improved from 10/16/2011.      Thank you for the opportunity to contribute to the care of your patient.     Verified By: Osa Craver, M.D., MD    26-Apr-13 06:32, Chest Portable Single View   Chest Portable Single View    REASON FOR EXAM:    Assess for Pleural Effusion  COMMENTS:       PROCEDURE: DXR - DXR PORTABLE CHEST SINGLE VIEW  -  Oct 19 2011  6:32AM     RESULT: Comparison: 10/18/2011    Findings:  Heart and mediastinum are stable. Right-sided chest tubes remain in   place. No definite pneumothorax seen. Mild right basilar opacities are   similar to prior and likely secondary to atelectasis. The left lung is   clear.    IMPRESSION:   Unchanged chest radiograph, with mild right basilar opacities that likely     represent atelectasis.          Verified By: Gregor Hams, M.D., MD    27-Apr-13 06:25, Chest Portable Single View   Chest Portable Single View    REASON FOR EXAM:    Assess for Pleural Effusion  COMMENTS:       PROCEDURE: DXR - DXR PORTABLE CHEST SINGLE VIEW  - Oct 20 2011  6:25AM     RESULT: Comparison: 10/19/2011, 10/18/2011    Findings:     Single portable AP chest radiograph is provided. There are 2 right-sided   chest tubes in satisfactory position. There is no definite pneumothorax.   There is right basilar atelectasis. There is elevation of the right   diaphragm. There is no focal parenchymal opacity, pleural effusion, or   pneumothorax. Normal cardiomediastinal silhouette. The osseous structures   are unremarkable.  IMPRESSION:     No significant interval change compared with the prior exam.    Dictation Site:  3          Verified By: Jennette Banker, M.D., MD    28-Apr-13 06:25, Chest Portable Single View   Chest Portable Single View    REASON FOR EXAM:    Assess for Pleural Effusion  COMMENTS:       PROCEDURE: DXR - DXR PORTABLE CHEST SINGLE VIEW  - Oct 21 2011  6:25AM     RESULT: Comparison: 10/19/2011, 10/18/2011     Findings:     Single portable AP chest radiograph is provided. There are 2 right-sided   chest tubes in satisfactory position. There is no definite pneumothorax.   There is right basilar atelectasis. There is elevation of the right   diaphragm. There is a trace right pleural effusion. There is likely right   basilar atelectasis. There is no left pleural effusion. There is a left   focal parenchymal opacity. Normal cardiomediastinal silhouette. The     osseous structures are unremarkable.    IMPRESSION:      2 right-sided chest tubes in satisfactory position without pneumothorax.   Trace right pleural effusion.    Dictation Site: 3          Verified By: Jennette Banker, M.D., MD    29-Apr-13 13:11, Chest PA and Lateral   Chest PA and Lateral    REASON FOR EXAM:    access for pleural effusion  COMMENTS:       PROCEDURE: DXR - DXR CHEST PA (OR AP) AND LATERAL  - Oct 22 2011  1:11PM     RESULT: Comparison is made to the prior exam of 10/21/2011. Two right   chest tubes are present. There is slight thickening of the pleural shadow   at the right apex compatible with pleural reactive change or a trace of   fluid. This has been present previously. No pleural effusion is seen at   the right costophrenic angle. In the lateral view, there is a   pleural-based triangular density compatible with atelectasis, pneumonia   or pulmonary infarction. The left lung field is clear.  Heart size is   normal.    IMPRESSION:   1. Multiple right chest tubes are noted.  2. No pneumothorax is seen.  3. There is slight thickening of the pleural shadow at the right apex   compatible  with pleural reactive change or a trace effusion.  4. There is a triangular density posteriorly in the right chest   compatible with pneumonia, atelectasis or pulmonary infarction.    Thank you for the opportunity to contribute to the care of your patient.           Verified By: Dionne Ano WALL, M.D., MD    30-Apr-13 06:22, Chest PA and Lateral   Chest PA and Lateral    REASON FOR EXAM:    access for pleural effusion  COMMENTS:       PROCEDURE: DXR - DXR CHEST PA (OR AP) AND LATERAL  - Oct 23 2011  6:22AM     RESULT: Comparison is made to the prior exam of 10/22/2011. Multiple right   chest tubes are again seen. No definite pneumothorax is observed. There   is again noted a pleural-based triangular infiltrate posteriorly in the   right base. There has been little or no appreciable interval change as   compared to the exam of yesterday. No new pulmonary infiltrates are seen.   Monitoring electrodes are present. There is trace subcutaneous emphysema   along the inferior aspect of the right hemithorax.    IMPRESSION:      1. Two right chest tubes are again seen.   2. No pneumothorax is seen.  3. There persists an infiltrate posteriorly in the right base.  4. No new pulmonary infiltrates are seen.  5. Heart size is normal.    Thank you for the opportunity to contribute to the care of your patient.           Verified By: Dionne Ano WALL, M.D., MD   Assessment/Plan:  Invasive Device Daily Assessment of Necessity:   Does the patient currently have any of the following indwelling devices? none   Assessment/Plan:   Assessment IMP Chest tube out Post op R lung surgery Squamous Cell Ca Lung Chest tube drainging HTN DM Hyperlipidemia COPD CAD    Plan PLAN Should be able to go home today Encourage PO intake Advance activity as much as possible Agree with removing Chest tube Hopefully sealing stop leak  SOB stable Increase ambulation Continue Bp meds Agree with Statin  therapy DM control Cardiac status stable today F/U with me as outpt 2-4 weeks   Electronic Signatures: Coren Crownover D (MD)  (Signed 30-Apr-13 10:34)  Authored: Chief Complaint, VITAL SIGNS/ANCILLARY NOTES, Brief Assessment, Lab Results, Radiology Results, Assessment/Plan   Last Updated: 30-Apr-13 10:34 by Yolonda Kida (MD)

## 2014-10-17 NOTE — Op Note (Signed)
PATIENT NAME:  Nathaniel Lane, Nathaniel Lane MR#:  [id number] DATE OF BIRTH:  02/12/1946  DATE OF PROCEDURE:  10/18/2011  PREOPERATIVE DIAGNOSIS: Persistent air leak following lobectomy.   POSTOPERATIVE DIAGNOSIS: Persistent air leak following lobectomy.   PROCEDURE PERFORMED: Doxycycline with monitored anesthesia.   SURGEON: Makalyn Lennox E. Lalisa Kiehn, MD  INDICATIONS FOR PROCEDURE: [name] is a 69 year old gentleman who is now eight days status post lobectomy who has a persistent air leak. An attempt was made at pleurodesis on the floor yesterday which he tolerated very poorly because of pain and anxiety. It was elected to bring the patient to the Operating Room today for intravenous sedation where we can perform the doxycycline pleurodesis. The indications and risks of the procedure were explained to the patient who gave his informed consent.   DESCRIPTION OF PROCEDURE: Patient was brought to the operating suite where he was given intravenous sedation by our anesthesia staff. When it was appropriate he was then given 500 mg of doxycycline by way of the chest tube after it was sterilely prepared with alcohol. A timeout had been called to verify the proper patient position. Once the doxycycline was introduced into the pleural space the chest tube was suspended from the IV pole to prevent the doxycycline from leaving the pleural space. The patient was monitored in the Operating Room for several minutes and when stable was brought to the recovery room where he is currently residing and recovering.   ____________________________ Lew Dawes. Genevive Bi, MD teo:cms D: 10/18/2011 15:21:23 ET T: 10/18/2011 15:31:11 ET JOB#: [id number]  cc: Christia Reading E. Genevive Bi, MD, <Dictator> Louis Matte MD ELECTRONICALLY SIGNED 10/25/2011 14:29

## 2014-10-17 NOTE — Op Note (Signed)
PATIENT NAME:  Nathaniel Lane, Nathaniel Lane MR#:  [id number] DATE OF BIRTH:  Aug 25, 1945  DATE OF PROCEDURE:  10/17/2011  PREOPERATIVE DIAGNOSIS: Persistent air leak status post right lower lobectomy.   POSTOPERATIVE DIAGNOSIS: Persistent air leak status post right lower lobectomy.   OPERATION PERFORMED: Bedside doxycycline pleurodesis.   SURGEON: Dashanna Kinnamon E. Genevive Bi, MD  ASSISTANT: None.   INDICATION FOR PROCEDURE: The patient is now eight days out from a right thoracotomy and right lower lobectomy. He has had a persistent air leak since surgery which has been steadily improving, however, I felt that we could hasten his rehabilitation with bedside doxycycline pleurodesis. The indications and risks were explained and the patient who gave his informed consent.   DESCRIPTION OF PROCEDURE: With the patient lying supine in bed and appropriately monitored the right chest tube was suspended over the IV tubing so that air could escape from the system but fluid could not. The tube was taken off suction. The chest tube was prepped and draped. 100 mg of lidocaine was inserted sterilely into the chest tube. The patient was rolled from side to side and after approximately 10 minutes the doxycycline was administered in a similar fashion. During administration of the doxycycline the patient complained of severe pain. I had only administered approximately 250 mg of the total 500 mg when he asked that the procedure be stopped. He stated that the pain and shortness of breath required [place] to remove the doxycycline. We therefore let the tubing down to the ground and re-hooked the suction to the Pleur-evac which evacuated the pleural space. The patient tolerated that portion of the procedure poorly but only for a short period of time. Once the tube was reconnected he felt significantly better. The patient was then monitored and once stable the chest tube was reconnected to suction and left in that position.   ____________________________ Lew Dawes Genevive Bi, MD teo:cms D: 10/17/2011 20:01:53 ET T: 10/18/2011 10:19:51 ET  JOB#: [id number] cc: Christia Reading E. Genevive Bi, MD, <Dictator> Louis Matte MD ELECTRONICALLY SIGNED 10/25/2011 14:29

## 2014-10-19 ENCOUNTER — Encounter: Payer: Self-pay | Admitting: Internal Medicine

## 2014-10-19 ENCOUNTER — Ambulatory Visit (INDEPENDENT_AMBULATORY_CARE_PROVIDER_SITE_OTHER): Payer: Medicare Other | Admitting: Internal Medicine

## 2014-10-19 VITALS — BP 118/78 | HR 73 | Temp 97.5°F | Resp 16 | Ht 70.0 in | Wt 190.0 lb

## 2014-10-19 DIAGNOSIS — E1165 Type 2 diabetes mellitus with hyperglycemia: Secondary | ICD-10-CM

## 2014-10-19 DIAGNOSIS — E1129 Type 2 diabetes mellitus with other diabetic kidney complication: Secondary | ICD-10-CM | POA: Diagnosis not present

## 2014-10-19 DIAGNOSIS — E785 Hyperlipidemia, unspecified: Secondary | ICD-10-CM

## 2014-10-19 DIAGNOSIS — E890 Postprocedural hypothyroidism: Secondary | ICD-10-CM

## 2014-10-19 DIAGNOSIS — I10 Essential (primary) hypertension: Secondary | ICD-10-CM | POA: Diagnosis not present

## 2014-10-19 DIAGNOSIS — IMO0002 Reserved for concepts with insufficient information to code with codable children: Secondary | ICD-10-CM

## 2014-10-19 LAB — HM DIABETES FOOT EXAM: HM Diabetic Foot Exam: NORMAL

## 2014-10-19 NOTE — Progress Notes (Signed)
Pre-visit discussion using our clinic review tool. No additional management support is needed unless otherwise documented below in the visit note.  

## 2014-10-19 NOTE — Progress Notes (Signed)
[name], male   DOB: 1945/08/24, 69 y.o.   MRN: [id number]  Patient Active Problem List   Diagnosis Date Noted  . Controlled type 2 diabetes mellitus with diabetic nephropathy 07/24/2014  . COPD, mild 07/24/2014  . Anxiety state, unspecified 10/27/2013  . Exertional dyspnea 07/16/2013  . CKD (chronic kidney disease) stage 3, GFR 30-59 ml/min 07/16/2013  . 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 Thyroid cancer   . Squamous cell lung cancer   . CAD (coronary artery disease), autologous vein bypass graft 07/12/2011  . Personal history of pneumonia 07/12/2011  . DM type 2, uncontrolled, with renal complications 42/70/6237  . Hyperlipidemia   . Hypertension   . Polyp of colon, adenomatous 04/08/2011    Subjective:  CC:   Chief Complaint  Patient presents with  . Follow-up  . Diabetes    HPI:    Nathaniel Lane is a 69 y.o. male who presents for follow up on type 2 DM,  Hypertension and hyperlipidemia.    Follow up on type 2  DM,  Hyperlipidemia,  Hypertension and lung CA.  His last OV was 3 months ago. He  has gained 3  Lbs and is now overweight.  His wife has dementia and burns the meal once a week but makes the best cornbread "in the world," which he eats   4 or 5 times per  Week, with various meats including cubesteak ,  Chicken, and Pork chops. He typically eats out on Sundays,  And his Sunday meal includes Fried chicken, pplie pie, etc.  He is taking his medications as directed.  He is not exercising.  He has developed some tingling and burning of the 5th toe,  And his great toe feels numb.   History of Lung Ca:  He had a "perfect checkup"  from his oncologist Nathaniel Lane,  Next follow up 6 months,  Then annually after that.   he remains cancer free   Past Medical History  Diagnosis Date  . Diabetes mellitus   . Hyperlipidemia   . Hypertension   . Myocardial infarction   . Cancer 2012    Stage IIA squamous cell Lung Ca  . Thyroid cancer 2013    s/p  thyroidectomy 2014    Past Surgical History  Procedure Laterality Date  . Coronary artery bypass graft  2007  . Lung lobectomy    . Total thyroidectomy N/A        The following portions of the patient's history were reviewed and updated as appropriate: Allergies, current medications, and problem list.    Review of Systems:   Patient denies headache, fevers, malaise, unintentional weight loss, skin rash, eye pain, sinus congestion and sinus pain, sore throat, dysphagia,  hemoptysis , cough, dyspnea, wheezing, chest pain, palpitations, orthopnea, edema, abdominal pain, nausea, melena, diarrhea, constipation, flank pain, dysuria, hematuria, urinary  Frequency, nocturia, numbness, tingling, seizures,  Focal weakness, Loss of consciousness,  Tremor, insomnia, depression, anxiety, and suicidal ideation.     History   Social History  . Marital Status: Married    Spouse Name: N/A  . Number of Children: N/A  . 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 Smoking status: Former Smoker    Quit date: 07/15/2005  . Smokeless tobacco: Never Used  . Alcohol Use: No  . Drug Use: No  . Sexual Activity: Not on file   Other Topics Concern  .  Not on file   Social History Narrative    Objective:  Filed Vitals:   10/19/14 1617  BP: 118/78  Pulse: 73  Temp: 97.5 F (36.4 C)  Resp: 16     General appearance: alert, cooperative and appears stated age Ears: normal TM's and external ear canals both ears Throat: lips, mucosa, and tongue normal; teeth and gums normal Neck: no adenopathy, no carotid bruit, supple, symmetrical, trachea midline and thyroid not enlarged, symmetric, no tenderness/mass/nodules Back: symmetric, no curvature. ROM normal. No CVA tenderness. Lungs: clear to auscultation bilaterally Heart: regular rate and rhythm, S1, S2 normal, no murmur, click, rub or gallop Abdomen: soft, non-tender; bowel sounds  normal; no masses,  no organomegaly Pulses: 2+ and symmetric Skin: Skin color, texture, turgor normal. No rashes or lesions Lymph nodes: Cervical, supraclavicular, and axillary nodes normal.  Assessment and Plan:  DM type 2, uncontrolled, with renal complications Improved control with the addition of glipizide ER 2.5 mg in October for persistent hyperglycemia. He is taking an ACE I , high potency statin and ASA.  Neuropathy noted ,  Foot and eye exams are up to date.    Lab Results  Component Value Date   HGBA1C 6.9* 07/21/2014   Lab Results  Component Value Date   MICROALBUR 3.4* 02/23/2014   Lab Results  Component Value Date   CHOL 136 07/21/2014   HDL 30.40* 07/21/2014   LDLCALC 88 10/30/2013   LDLDIRECT 79.0 07/21/2014   TRIG 256.0* 07/21/2014   CHOLHDL 4 07/21/2014              Hypertension Well controlled on current regimen. Renal function stable, no changes today.  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  Followed by R -131 July 2014 .  Managed by Dr Nathaniel Lane.   Thyroid is suppressed Lab Results  Component Value Date   TSH 0.21* 07/21/2014       Hyperlipidemia Untreated during  cancer therapy.  Statin resumed for LDL 178 and history of CAD. Well controlled on current statin therapy.   Liver enzymes are normal , no changes today.  Lab Results  Component Value Date   CHOL 136 07/21/2014   HDL 30.40* 07/21/2014   LDLCALC 88 10/30/2013   LDLDIRECT 79.0 07/21/2014   TRIG 256.0* 07/21/2014   CHOLHDL 4 07/21/2014           Updated Medication List Outpatient Encounter Prescriptions as of 10/19/2014  Medication Sig  . aspirin 81 MG EC tablet TAKE 1 TABLET (81 MG TOTAL) BY  MOUTH DAILY. SWALLOW WHOLE.  Marland Kitchen atorvastatin (LIPITOR) 20 MG tablet Take 1 tablet (20 mg total) by mouth daily.  Marland Kitchen BAYER CONTOUR TEST test strip USE AS DIRECTED  . Blood Gluc Meter Disp-Strips (BLOOD GLUCOSE METER DISPOSABLE) DEVI Dose, route does not apply; to be used to check blood glucose 2-3 times daily  . Blood Glucose Monitoring Suppl W/DEVICE KIT Dose, route does not apply  . enalapril (VASOTEC) 10 MG tablet TAKE 1 TABLET BY MOUTH EVERY DAY  . gabapentin (NEURONTIN) 100 MG capsule TAKE 1 CAPSULE (100 MG TOTAL) BY MOUTH AT BEDTIME. INCREASE DOSE WEEKLY BY 100 MG  . glipiZIDE (GLUCOTROL XL) 2.5 MG 24  hr tablet Take 1 tablet (2.5 mg total) by mouth daily with breakfast.  . Lancets MISC Dose, route does not apply; to be used to check blood glucose 2-3 times daily  . levothyroxine (SYNTHROID, LEVOTHROID) 150 MCG tablet Take 150 mcg by mouth daily before breakfast.  . ranitidine (ZANTAC) 150 MG capsule Take 150 mg by mouth every evening.  . tamsulosin (FLOMAX) 0.4 MG CAPS capsule TAKE ONE CAPSULE BY MOUTH EVERY DAY  . [DISCONTINUED] enalapril (VASOTEC) 10 MG tablet TAKE 1 TABLET BY MOUTH EVERY DAY  . [DISCONTINUED] glipiZIDE (GLUCOTROL XL) 5 MG 24 hr tablet Take 1 tablet (5 mg total) by mouth daily with breakfast.     Orders Placed This Encounter  Procedures  . [facility] DIABETES FOOT EXAM    Return in about 6 months (around 04/20/2015).

## 2014-10-19 NOTE — Patient Instructions (Signed)
You can increase the gabapentin to 4 tablets at bedtime, and you can also add one in the morning and one after lunch if needed  Return after tomorrow for fasting  Labs  Diabetic Neuropathy Diabetic neuropathy is a nerve disease or nerve damage that is caused by diabetes mellitus. About half of all people with diabetes mellitus have some form of nerve damage. Nerve damage is more common in those who have had diabetes mellitus for many years and who generally have not had good control of their blood sugar (glucose) level. Diabetic neuropathy is a common complication of diabetes mellitus. There are three more common types of diabetic neuropathy and a fourth type that is less common and less understood:   Peripheral neuropathy--This is the most common type of diabetic neuropathy. It causes damage to the nerves of the feet and legs first and then eventually the hands and arms.The damage affects the ability to sense touch.  Autonomic neuropathy--This type causes damage to the autonomic nervous system, which controls the following functions:  Heartbeat.  Body temperature.  Blood pressure.  Urination.  Digestion.  Sweating.  Sexual function.  Focal neuropathy--Focal neuropathy can be painful and unpredictable and occurs most often in older adults with diabetes mellitus. It involves a specific nerve or one area and often comes on suddenly. It usually does not cause long-term problems.  Radiculoplexus neuropathy-- Sometimes called lumbosacral radiculoplexus neuropathy, radiculoplexus neuropathy affects the nerves of the thighs, hips, buttocks, or legs. It is more common in people with type 2 diabetes mellitus and in older men. It is characterized by debilitating pain, weakness, and atrophy, usually in the thigh muscles. CAUSES  The cause of peripheral, autonomic, and focal neuropathies is diabetes mellitus that is uncontrolled and high glucose levels. The cause of radiculoplexus neuropathy is  unknown. However, it is thought to be caused by inflammation related to uncontrolled glucose levels. SIGNS AND SYMPTOMS  Peripheral Neuropathy Peripheral neuropathy develops slowly over time. When the nerves of the feet and legs no longer work there may be:   Burning, stabbing, or aching pain in the legs or feet.  Inability to feel pressure or pain in your feet. This can lead to:  Thick calluses over pressure areas.  Pressure sores.  Ulcers.  Foot deformities.  Reduced ability to feel temperature changes.  Muscle weakness. Autonomic Neuropathy The symptoms of autonomic neuropathy vary depending on which nerves are affected. Symptoms may include:  Problems with digestion, such as:  Feeling sick to your stomach (nausea).  Vomiting.  Bloating.  Constipation.  Diarrhea.  Abdominal pain.  Difficulty with urination. This occurs if you lose your ability to sense when your bladder is full. Problems include:  Urine leakage (incontinence).  Inability to empty your bladder completely (retention).  Rapid or irregular heartbeat (palpitations).  Blood pressure drops when you stand up (orthostatic hypotension). When you stand up you may feel:  Dizzy.  Weak.  Faint.  In men, inability to attain and maintain an erection.  In women, vaginal dryness and problems with decreased sexual desire and arousal.  Problems with body temperature regulation.  Increased or decreased sweating. Focal Neuropathy  Abnormal eye movements or abnormal alignment of both eyes.  Weakness in the wrist.  Foot drop. This results in an inability to lift the foot properly and abnormal walking or foot movement.  Paralysis on one side of your face (Bell palsy).  Chest or abdominal pain. Radiculoplexus Neuropathy  Sudden, severe pain in your hip, thigh, or buttocks.  Weakness and wasting of thigh muscles.  Difficulty rising from a seated position.  Abdominal swelling.  Unexplained  weight loss (usually more than 10 lb [4.5 kg]). DIAGNOSIS  Peripheral Neuropathy Your senses may be tested. Sensory function testing can be done with:  A light touch using a monofilament.  A vibration with tuning fork.  A sharp sensation with a pin prick. Other tests that can help diagnose neuropathy are:  Nerve conduction velocity. This test checks the transmission of an electrical current through a nerve.  Electromyography. This shows how muscles respond to electrical signals transmitted by nearby nerves.  Quantitative sensory testing. This is used to assess how your nerves respond to vibrations and changes in temperature. Autonomic Neuropathy Diagnosis is often based on reported symptoms. Tell your health care provider if you experience:   Dizziness.   Constipation.   Diarrhea.   Inappropriate urination or inability to urinate.   Inability to get or maintain an erection.  Tests that may be done include:   Electrocardiography or Holter monitor. These are tests that can help show problems with the heart rate or heart rhythm.   An X-ray exam may be done. Focal Neuropathy Diagnosis is made based on your symptoms and what your health care provider finds during your exam. Other tests may be done. They may include:  Nerve conduction velocities. This checks the transmission of electrical current through a nerve.  Electromyography. This shows how muscles respond to electrical signals transmitted by nearby nerves.  Quantitative sensory testing. This test is used to assess how your nerves respond to vibration and changes in temperature. Radiculoplexus Neuropathy  Often the first thing is to eliminate any other issue or problems that might be the cause, as there is no stick test for diagnosis.  X-ray exam of your spine and lumbar region.  Spinal tap to rule out cancer.  MRI to rule out other lesions. TREATMENT  Once nerve damage occurs, it cannot be reversed. The goal  of treatment is to keep the disease or nerve damage from getting worse and affecting more nerve fibers. Controlling your blood glucose level is the key. Most people with radiculoplexus neuropathy see at least a partial improvement over time. You will need to keep your blood glucose and HbA1c levels in the target range determined by your health care provider. Things that help control blood glucose levels include:   Blood glucose monitoring.   Meal planning.   Physical activity.   Diabetes medicine.  Over time, maintaining lower blood glucose levels helps lessen symptoms. Sometimes, prescription pain medicine is needed. HOME CARE INSTRUCTIONS:  Do not smoke.  Keep your blood glucose level in the range that you and your health care provider have determined acceptable for you.  Keep your blood pressure level in the range that you and your health care provider have determined acceptable for you.  Eat a well-balanced diet.  Be active every day.  Check your feet every day. SEEK MEDICAL CARE IF:   You have burning, stabbing, or aching pain in the legs or feet.  You are unable to feel pressure or pain in your feet.  You develop problems with digestion such as:  Nausea.  Vomiting.  Bloating.  Constipation.  Diarrhea.  Abdominal pain.  You have difficulty with urination, such as:  Incontinence.  Retention.  You have palpitations.  You develop orthostatic hypotension. When you stand up you may feel:  Dizzy.  Weak.  Faint.  You cannot attain and maintain an erection (  in men).  You have vaginal dryness and problems with decreased sexual desire and arousal (in women).  You have severe pain in your thighs, legs, or buttocks.  You have unexplained weight loss. Document Released: 08/20/2001 Document Revised: 04/01/2013 Document Reviewed: 11/20/2012 [facility] Patient Information 2015 [place], [place]. This information is not intended to replace advice given to you by  your health care provider. Make sure you discuss any questions you have with your health care provider. ,

## 2014-10-20 ENCOUNTER — Telehealth: Payer: Self-pay | Admitting: *Deleted

## 2014-10-20 ENCOUNTER — Other Ambulatory Visit (INDEPENDENT_AMBULATORY_CARE_PROVIDER_SITE_OTHER): Payer: Medicare Other

## 2014-10-20 DIAGNOSIS — Z85850 Personal history of malignant neoplasm of thyroid: Secondary | ICD-10-CM

## 2014-10-20 DIAGNOSIS — E1142 Type 2 diabetes mellitus with diabetic polyneuropathy: Secondary | ICD-10-CM

## 2014-10-20 DIAGNOSIS — E875 Hyperkalemia: Secondary | ICD-10-CM

## 2014-10-20 DIAGNOSIS — R7989 Other specified abnormal findings of blood chemistry: Secondary | ICD-10-CM

## 2014-10-20 DIAGNOSIS — G629 Polyneuropathy, unspecified: Secondary | ICD-10-CM | POA: Diagnosis not present

## 2014-10-20 LAB — COMPREHENSIVE METABOLIC PANEL
ALT: 40 U/L (ref 0–53)
AST: 19 U/L (ref 0–37)
Albumin: 4 g/dL (ref 3.5–5.2)
Alkaline Phosphatase: 128 U/L — ABNORMAL HIGH (ref 39–117)
BILIRUBIN TOTAL: 0.4 mg/dL (ref 0.2–1.2)
BUN: 29 mg/dL — AB (ref 6–23)
CALCIUM: 9.4 mg/dL (ref 8.4–10.5)
CO2: 28 mEq/L (ref 19–32)
Chloride: 107 mEq/L (ref 96–112)
Creatinine, Ser: 1.61 mg/dL — ABNORMAL HIGH (ref 0.40–1.50)
GFR: 45.74 mL/min — ABNORMAL LOW (ref >60.00)
Glucose, Bld: 168 mg/dL — ABNORMAL HIGH (ref 70–99)
Potassium: 5.4 mEq/L — ABNORMAL HIGH (ref 3.5–5.1)
Sodium: 140 mEq/L (ref 135–145)
Total Protein: 6.8 g/dL (ref 6.0–8.3)

## 2014-10-20 LAB — LIPID PANEL
CHOL/HDL RATIO: 5
CHOLESTEROL: 147 mg/dL (ref 0–200)
HDL: 31.3 mg/dL — ABNORMAL LOW (ref >39.00)
NonHDL: 115.7
Triglycerides: 231 mg/dL — ABNORMAL HIGH (ref 0.0–149.0)
VLDL: 46.2 mg/dL — AB (ref 0.0–40.0)

## 2014-10-20 LAB — HEMOGLOBIN A1C: Hgb A1c MFr Bld: 6.3 % (ref 4.6–6.5)

## 2014-10-20 LAB — TSH: TSH: 0.21 u[IU]/mL — AB (ref 0.35–4.50)

## 2014-10-20 LAB — LDL CHOLESTEROL, DIRECT: LDL DIRECT: 87 mg/dL

## 2014-10-20 NOTE — Telephone Encounter (Signed)
Labs and dx?  

## 2014-10-21 LAB — THYROGLOBULIN ANTIBODY PANEL
THYROID PEROXIDASE ANTIBODY: 1 [IU]/mL (ref <9)
Thyroglobulin: 0.1 ng/mL — ABNORMAL LOW (ref 2.8–40.9)

## 2014-10-21 MED ORDER — GLIPIZIDE ER 2.5 MG PO TB24: 2.5000 mg | ORAL_TABLET | Freq: Every day | ORAL | Status: DC

## 2014-10-21 NOTE — Assessment & Plan Note (Signed)
.  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  Followed by R -131 July 2014 .  Managed by Dr Gabriel Carina.   Thyroid is suppressed Lab Results  Component Value Date   TSH 0.21* 07/21/2014

## 2014-10-21 NOTE — Assessment & Plan Note (Signed)
Well controlled on current regimen. Renal function stable, no changes today.  Lab Results  Component Value Date   CREATININE 1.79* 07/21/2014   Lab Results  Component Value Date   NA 139 07/21/2014   K 4.7 07/21/2014   CL 107 07/21/2014   CO2 22 07/21/2014

## 2014-10-21 NOTE — Assessment & Plan Note (Signed)
Untreated during  cancer therapy.  Statin resumed for LDL 178 and history of CAD. Well controlled on current statin therapy.   Liver enzymes are normal , no changes today.  Lab Results  Component Value Date   CHOL 136 07/21/2014   HDL 30.40* 07/21/2014   LDLCALC 88 10/30/2013   LDLDIRECT 79.0 07/21/2014   TRIG 256.0* 07/21/2014   CHOLHDL 4 07/21/2014

## 2014-10-21 NOTE — Assessment & Plan Note (Signed)
Improved control with the addition of glipizide ER 2.5 mg in October for persistent hyperglycemia. He is taking an ACE I , high potency statin and ASA.  Neuropathy noted ,  Foot and eye exams are up to date.    Lab Results  Component Value Date   HGBA1C 6.9* 07/21/2014   Lab Results  Component Value Date   MICROALBUR 3.4* 02/23/2014   Lab Results  Component Value Date   CHOL 136 07/21/2014   HDL 30.40* 07/21/2014   LDLCALC 88 10/30/2013   LDLDIRECT 79.0 07/21/2014   TRIG 256.0* 07/21/2014   CHOLHDL 4 07/21/2014         

## 2014-10-27 NOTE — Addendum Note (Signed)
Addended by: Crecencio Mc on: 10/27/2014 12:10 PM   Modules accepted: Orders

## 2014-11-01 ENCOUNTER — Ambulatory Visit: Payer: Medicare Other

## 2014-11-01 ENCOUNTER — Other Ambulatory Visit (INDEPENDENT_AMBULATORY_CARE_PROVIDER_SITE_OTHER): Payer: Medicare Other

## 2014-11-01 ENCOUNTER — Other Ambulatory Visit: Payer: Medicare Other

## 2014-11-01 VITALS — BP 122/68 | HR 78 | Temp 97.9°F | Resp 14 | Ht 70.0 in | Wt 190.5 lb

## 2014-11-01 DIAGNOSIS — E875 Hyperkalemia: Secondary | ICD-10-CM | POA: Diagnosis not present

## 2014-11-01 DIAGNOSIS — Z0000 Encounter for general adult medical examination without abnormal findings: Secondary | ICD-10-CM

## 2014-11-01 NOTE — Progress Notes (Unsigned)
Subjective:   Nathaniel Lane is a 69 y.o. male who presents for Medicare Annual/Subsequent preventive examination.  Review of Systems:   Cardiac Risk Factors include: hypertension;dyslipidemia;diabetes mellitus     Objective:    Vitals: BP 122/68 mmHg  Pulse 78  Temp(Src) 97.9 F (36.6 C) (Oral)  Resp 14  Ht 5\' 10"  (1.778 m)  Wt 190 lb 8 oz (86.41 kg)  BMI 27.33 kg/m2  SpO2 96%  Tobacco History  Smoking status  . Former Smoker  . Quit date: 07/15/2005  Smokeless tobacco  . Never Used     Counseling given: Not Answered   Past Medical History  Diagnosis Date  . Diabetes mellitus   . Hyperlipidemia   . Hypertension   . Myocardial infarction   . Cancer 2012    Stage IIA squamous cell Lung Ca  . Thyroid cancer 2013    s/p thyroidectomy 2014   Past Surgical History  Procedure Laterality Date  . Coronary artery bypass graft  2007  . Lung lobectomy    . Total thyroidectomy N/A    Family History  Problem Relation Age of Onset  . Hypertension Other   . Heart disease Father    History  Sexual Activity  . Sexual Activity: Not on file    Outpatient Encounter Prescriptions as of 11/01/2014  Medication Sig  . aspirin 81 MG EC tablet TAKE 1 TABLET (81 MG TOTAL) BY MOUTH DAILY. SWALLOW WHOLE.  01/01/2015 atorvastatin (LIPITOR) 20 MG tablet Take 1 tablet (20 mg total) by mouth daily.  Marland Kitchen BAYER CONTOUR TEST test strip USE AS DIRECTED  . Blood Gluc Meter Disp-Strips (BLOOD GLUCOSE METER DISPOSABLE) DEVI Dose, route does not apply; to be used to check blood glucose 2-3 times daily  . Blood Glucose Monitoring Suppl W/DEVICE KIT Dose, route does not apply  . enalapril (VASOTEC) 10 MG tablet TAKE 1 TABLET BY MOUTH EVERY DAY  . gabapentin (NEURONTIN) 100 MG capsule TAKE 1 CAPSULE (100 MG TOTAL) BY MOUTH AT BEDTIME. INCREASE DOSE WEEKLY BY 100 MG  . glipiZIDE (GLUCOTROL XL) 2.5 MG 24 hr tablet Take 1 tablet (2.5 mg total) by mouth daily with breakfast.  . Lancets MISC Dose, route  does not apply; to be used to check blood glucose 2-3 times daily  . levothyroxine (SYNTHROID, LEVOTHROID) 150 MCG tablet Take 150 mcg by mouth daily before breakfast.  . ranitidine (ZANTAC) 150 MG capsule Take 150 mg by mouth every evening.  . tamsulosin (FLOMAX) 0.4 MG CAPS capsule TAKE ONE CAPSULE BY MOUTH EVERY DAY   No facility-administered encounter medications on file as of 11/01/2014.    Activities of Daily Living In your present state of health, do you have any difficulty performing the following activities: 11/01/2014 07/24/2014  Hearing? N N  Vision? N N  Difficulty concentrating or making decisions? N N  Walking or climbing stairs? N N  Dressing or bathing? N N  Doing errands, shopping? N N  Preparing Food and eating ? N -  Using the Toilet? N -  In the past six months, have you accidently leaked urine? N -  Do you have problems with loss of bowel control? N -  Managing your Medications? N -  Managing your Finances? N -  Housekeeping or managing your Housekeeping? N -    Patient Care Team: 07/26/2014, MD as PCP - General (Internal Medicine)   Assessment:     Exercise Activities and Dietary recommendations Current Exercise Habits:: The  patient does not participate in regular exercise at present  Goals    None     Fall Risk Fall Risk  11/01/2014 01/12/2014  Falls in the past year? No No   Depression Screen PHQ 2/9 Scores 11/01/2014 01/12/2014 04/21/2012  PHQ - 2 Score 0 0 0    Cognitive Testing MMSE - Mini Mental State Exam 11/01/2014  Orientation to time 5  Orientation to Place 5  Registration 3  Attention/ Calculation 5  Recall 3  Language- name 2 objects 2  Language- repeat 1  Language- follow 3 step command 3  Language- read & follow direction 1  Write a sentence 1  Copy design 1  Total score 30    Immunization History  Administered Date(s) Administered  . Influenza Split 04/15/2013  . Pneumococcal Conjugate-13 07/15/2013  . Pneumococcal  Polysaccharide-23 01/15/2012  . Tdap 03/06/2007   Screening Tests Health Maintenance  Topic Date Due  . ZOSTAVAX  12/05/2007  . INFLUENZA VACCINE  04/14/2015 (Originally 01/24/2015)  . URINE MICROALBUMIN  02/24/2015  . HEMOGLOBIN A1C  04/21/2015  . OPHTHALMOLOGY EXAM  08/19/2015  . FOOT EXAM  10/19/2015  . PNA vac Low Risk Adult (2 of 2 - PPSV23) 01/14/2017  . TETANUS/TDAP  03/05/2017  . COLONOSCOPY  03/22/2021      Plan:    During the course of the visit the patient was educated and counseled about the following appropriate screening and preventive services:   Vaccines to include Pneumoccal, Influenza, Hepatitis B, Td, Zostavax, HCV  Electrocardiogram  Cardiovascular Disease  Colorectal cancer screening  Diabetes screening  Prostate Cancer Screening  Glaucoma screening  Nutrition counseling   Smoking cessation counseling  Patient Instructions (the written plan) was given to the patient.    Geni Bers, LPN  0/07/8900

## 2014-11-01 NOTE — Patient Instructions (Addendum)
Nathaniel Lane , Thank you for taking time to come for your Medicare Wellness Visit. I appreciate your ongoing commitment to your health goals. Please review the following plan we discussed and let me know if I can assist you in the future. Please check with your pharmacy on the price of the shingles vaccine.   These are the goals we discussed: Goals    None      This is a list of the screening recommended for you and due dates:  Health Maintenance  Topic Date Due  . Shingles Vaccine  12/05/2007  . Flu Shot  04/14/2015*  . Urine Protein Check  02/24/2015  . Hemoglobin A1C  04/21/2015  . Eye exam for diabetics  08/19/2015  . Complete foot exam   10/19/2015  . Pneumonia vaccines (2 of 2 - PPSV23) 01/14/2017  . Tetanus Vaccine  03/05/2017  . Colon Cancer Screening  03/22/2021  *Topic was postponed. The date shown is not the original due date.    Health Maintenance A healthy lifestyle and preventative care can promote health and wellness.  Maintain regular health, dental, and eye exams.  Eat a healthy diet. Foods like vegetables, fruits, whole grains, low-fat dairy products, and lean protein foods contain the nutrients you need and are low in calories. Decrease your intake of foods high in solid fats, added sugars, and salt. Get information about a proper diet from your health care provider, if necessary.  Regular physical exercise is one of the most important things you can do for your health. Most adults should get at least 150 minutes of moderate-intensity exercise (any activity that increases your heart rate and causes you to sweat) each week. In addition, most adults need muscle-strengthening exercises on 2 or more days a week.   Maintain a healthy weight. The body mass index (BMI) is a screening tool to identify possible weight problems. It provides an estimate of body fat based on height and weight. Your health care provider can find your BMI and can help you achieve or maintain a  healthy weight. For males 20 years and older:  A BMI below 18.5 is considered underweight.  A BMI of 18.5 to 24.9 is normal.  A BMI of 25 to 29.9 is considered overweight.  A BMI of 30 and above is considered obese.  Maintain normal blood lipids and cholesterol by exercising and minimizing your intake of saturated fat. Eat a balanced diet with plenty of fruits and vegetables. Blood tests for lipids and cholesterol should begin at age 102 and be repeated every 5 years. If your lipid or cholesterol levels are high, you are over age 73, or you are at high risk for heart disease, you may need your cholesterol levels checked more frequently.Ongoing high lipid and cholesterol levels should be treated with medicines if diet and exercise are not working.  If you smoke, find out from your health care provider how to quit. If you do not use tobacco, do not start.  Lung cancer screening is recommended for adults aged 23-80 years who are at high risk for developing lung cancer because of a history of smoking. A yearly low-dose CT scan of the lungs is recommended for people who have at least a 30-pack-year history of smoking and are current smokers or have quit within the past 15 years. A pack year of smoking is smoking an average of 1 pack of cigarettes a day for 1 year (for example, a 30-pack-year history of smoking could mean smoking  1 pack a day for 30 years or 2 packs a day for 15 years). Yearly screening should continue until the smoker has stopped smoking for at least 15 years. Yearly screening should be stopped for people who develop a health problem that would prevent them from having lung cancer treatment.  If you choose to drink alcohol, do not have more than 2 drinks per day. One drink is considered to be 12 oz (360 mL) of beer, 5 oz (150 mL) of wine, or 1.5 oz (45 mL) of liquor.  Avoid the use of street drugs. Do not share needles with anyone. Ask for help if you need support or instructions about  stopping the use of drugs.  High blood pressure causes heart disease and increases the risk of stroke. Blood pressure should be checked at least every 1-2 years. Ongoing high blood pressure should be treated with medicines if weight loss and exercise are not effective.  If you are 51-65 years old, ask your health care provider if you should take aspirin to prevent heart disease.  Diabetes screening involves taking a blood sample to check your fasting blood sugar level. This should be done once every 3 years after age 55 if you are at a normal weight and without risk factors for diabetes. Testing should be considered at a younger age or be carried out more frequently if you are overweight and have at least 1 risk factor for diabetes.  Colorectal cancer can be detected and often prevented. Most routine colorectal cancer screening begins at the age of 9 and continues through age 85. However, your health care provider may recommend screening at an earlier age if you have risk factors for colon cancer. On a yearly basis, your health care provider may provide home test kits to check for hidden blood in the stool. A small camera at the end of a tube may be used to directly examine the colon (sigmoidoscopy or colonoscopy) to detect the earliest forms of colorectal cancer. Talk to your health care provider about this at age 78 when routine screening begins. A direct exam of the colon should be repeated every 5-10 years through age 20, unless early forms of precancerous polyps or small growths are found.  People who are at an increased risk for hepatitis B should be screened for this virus. You are considered at high risk for hepatitis B if:  You were born in a country where hepatitis B occurs often. Talk with your health care provider about which countries are considered high risk.  Your parents were born in a high-risk country and you have not received a shot to protect against hepatitis B (hepatitis B  vaccine).  You have HIV or AIDS.  You use needles to inject street drugs.  You live with, or have sex with, someone who has hepatitis B.  You are a man who has sex with other men (MSM).  You get hemodialysis treatment.  You take certain medicines for conditions like cancer, organ transplantation, and autoimmune conditions.  Hepatitis C blood testing is recommended for all people born from [birth year] through 1963 and any individual with known risk factors for hepatitis C.  Healthy men should no longer receive prostate-specific antigen (PSA) blood tests as part of routine cancer screening. Talk to your health care provider about prostate cancer screening.  Testicular cancer screening is not recommended for adolescents or adult males who have no symptoms. Screening includes self-exam, a health care provider exam, and other screening tests. Consult  with your health care provider about any symptoms you have or any concerns you have about testicular cancer.  Practice safe sex. Use condoms and avoid high-risk sexual practices to reduce the spread of sexually transmitted infections (STIs).  You should be screened for STIs, including gonorrhea and chlamydia if:  You are sexually active and are younger than 24 years.  You are older than 24 years, and your health care provider tells you that you are at risk for this type of infection.  Your sexual activity has changed since you were last screened, and you are at an increased risk for chlamydia or gonorrhea. Ask your health care provider if you are at risk.  If you are at risk of being infected with HIV, it is recommended that you take a prescription medicine daily to prevent HIV infection. This is called pre-exposure prophylaxis (PrEP). You are considered at risk if:  You are a man who has sex with other men (MSM).  You are a heterosexual man who is sexually active with multiple partners.  You take drugs by injection.  You are sexually active  with a partner who has HIV.  Talk with your health care provider about whether you are at high risk of being infected with HIV. If you choose to begin PrEP, you should first be tested for HIV. You should then be tested every 3 months for as long as you are taking PrEP.  Use sunscreen. Apply sunscreen liberally and repeatedly throughout the day. You should seek shade when your shadow is shorter than you. Protect yourself by wearing long sleeves, pants, a wide-brimmed hat, and sunglasses year round whenever you are outdoors.  Tell your health care provider of new moles or changes in moles, especially if there is a change in shape or color. Also, tell your health care provider if a mole is larger than the size of a pencil eraser.  A one-time screening for abdominal aortic aneurysm (AAA) and surgical repair of large AAAs by ultrasound is recommended for men aged 64-75 years who are current or former smokers.  Stay current with your vaccines (immunizations). Document Released: 12/08/2007 Document Revised: 06/16/2013 Document Reviewed: 11/06/2010 [facility] Patient Information 2015 [place], [place]. This information is not intended to replace advice given to you by your health care provider. Make sure you discuss any questions you have with your health care provider.

## 2014-11-02 LAB — BASIC METABOLIC PANEL
BUN: 32 mg/dL — AB (ref 6–23)
CALCIUM: 9.4 mg/dL (ref 8.4–10.5)
CHLORIDE: 105 meq/L (ref 96–112)
CO2: 24 mEq/L (ref 19–32)
CREATININE: 1.91 mg/dL — AB (ref 0.40–1.50)
GFR: 37.55 mL/min — ABNORMAL LOW (ref >60.00)
Glucose, Bld: 120 mg/dL — ABNORMAL HIGH (ref 70–99)
Potassium: 4.5 mEq/L (ref 3.5–5.1)
Sodium: 138 mEq/L (ref 135–145)

## 2014-11-09 ENCOUNTER — Other Ambulatory Visit: Payer: Self-pay | Admitting: Internal Medicine

## 2014-12-06 ENCOUNTER — Other Ambulatory Visit: Payer: Self-pay | Admitting: Internal Medicine

## 2014-12-20 ENCOUNTER — Other Ambulatory Visit: Payer: Self-pay | Admitting: *Deleted

## 2014-12-20 DIAGNOSIS — E785 Hyperlipidemia, unspecified: Secondary | ICD-10-CM

## 2014-12-20 MED ORDER — ATORVASTATIN CALCIUM 20 MG PO TABS: 20.0000 mg | ORAL_TABLET | Freq: Every day | ORAL | Status: DC

## 2015-01-05 ENCOUNTER — Other Ambulatory Visit: Payer: Self-pay | Admitting: Internal Medicine

## 2015-01-10 ENCOUNTER — Other Ambulatory Visit: Payer: Self-pay | Admitting: Oncology

## 2015-01-10 DIAGNOSIS — C3490 Malignant neoplasm of unspecified part of unspecified bronchus or lung: Secondary | ICD-10-CM

## 2015-01-11 ENCOUNTER — Ambulatory Visit
Admission: RE | Admit: 2015-01-11 | Discharge: 2015-01-11 | Disposition: A | Discharge status: Home / Self Care | Payer: Medicare Other | Source: Ambulatory Visit | Attending: Oncology | Admitting: Oncology

## 2015-01-11 ENCOUNTER — Inpatient Hospital Stay: Payer: Medicare Other | Attending: Oncology | Admitting: Oncology

## 2015-01-11 ENCOUNTER — Other Ambulatory Visit: Payer: Self-pay | Admitting: Oncology

## 2015-01-11 ENCOUNTER — Ambulatory Visit: Payer: Medicare Other | Admitting: Oncology

## 2015-01-11 ENCOUNTER — Other Ambulatory Visit: Payer: Self-pay | Admitting: *Deleted

## 2015-01-11 DIAGNOSIS — C3490 Malignant neoplasm of unspecified part of unspecified bronchus or lung: Secondary | ICD-10-CM

## 2015-01-11 DIAGNOSIS — C3431 Malignant neoplasm of lower lobe, right bronchus or lung: Secondary | ICD-10-CM | POA: Diagnosis not present

## 2015-01-11 DIAGNOSIS — J439 Emphysema, unspecified: Secondary | ICD-10-CM | POA: Diagnosis not present

## 2015-01-11 DIAGNOSIS — D3502 Benign neoplasm of left adrenal gland: Secondary | ICD-10-CM | POA: Insufficient documentation

## 2015-01-11 DIAGNOSIS — Z79899 Other long term (current) drug therapy: Secondary | ICD-10-CM | POA: Diagnosis not present

## 2015-01-11 DIAGNOSIS — E119 Type 2 diabetes mellitus without complications: Secondary | ICD-10-CM | POA: Insufficient documentation

## 2015-01-11 DIAGNOSIS — C73 Malignant neoplasm of thyroid gland: Secondary | ICD-10-CM | POA: Diagnosis not present

## 2015-01-11 DIAGNOSIS — I10 Essential (primary) hypertension: Secondary | ICD-10-CM | POA: Diagnosis not present

## 2015-01-11 DIAGNOSIS — Z951 Presence of aortocoronary bypass graft: Secondary | ICD-10-CM | POA: Diagnosis not present

## 2015-01-11 DIAGNOSIS — I700 Atherosclerosis of aorta: Secondary | ICD-10-CM | POA: Insufficient documentation

## 2015-01-11 DIAGNOSIS — Z87891 Personal history of nicotine dependence: Secondary | ICD-10-CM | POA: Diagnosis not present

## 2015-01-11 LAB — BASIC METABOLIC PANEL
Anion gap: 8 (ref 5–15)
BUN: 36 mg/dL — ABNORMAL HIGH (ref 6–20)
CHLORIDE: 103 mmol/L (ref 101–111)
CO2: 24 mmol/L (ref 22–32)
CREATININE: 1.85 mg/dL — AB (ref 0.61–1.24)
Calcium: 8.6 mg/dL — ABNORMAL LOW (ref 8.9–10.3)
GFR, EST AFRICAN AMERICAN: 42 mL/min — AB (ref >60)
GFR, EST NON AFRICAN AMERICAN: 36 mL/min — AB (ref >60)
Glucose, Bld: 240 mg/dL — ABNORMAL HIGH (ref 65–99)
POTASSIUM: 5 mmol/L (ref 3.5–5.1)
SODIUM: 135 mmol/L (ref 135–145)

## 2015-01-11 IMAGING — CT CT CHEST W/O CM
1 series · 15 of 33 positions shown, 19 images · non-contrast
Comparison: [DATE]

CLINICAL DATA: Lung cancer, squamous cell subtype, shortness of
breath.

EXAM:
CT CHEST WITHOUT CONTRAST
TECHNIQUE: Multidetector CT imaging of the chest was performed following the
standard protocol without IV contrast.

[Series 2: routine chest wo · axial · 0.72mm/px · z∈[-730,-456]mm · 15 of 65 slices shown, 19 images]
[im 5/65  mediastinal]
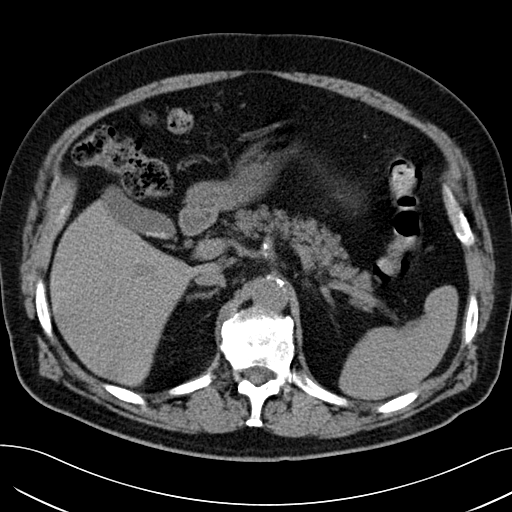
[im 5/65  lung]
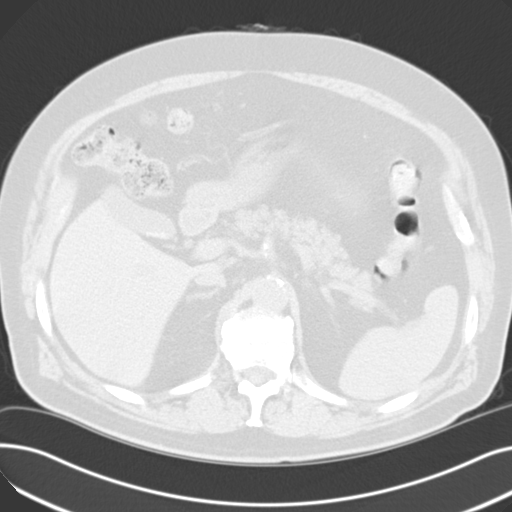
[im 10/65  lung]
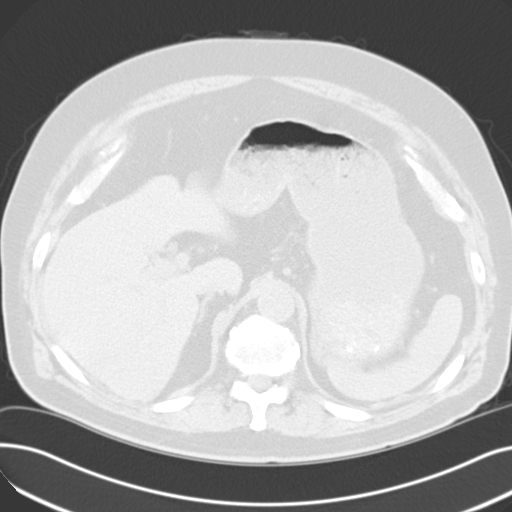
[im 13/65  lung]
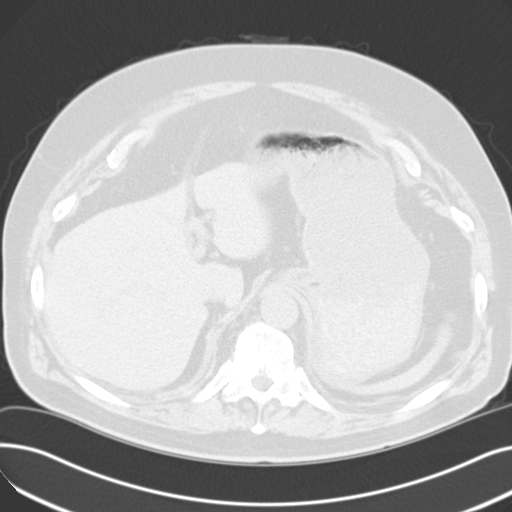
[im 17/65  lung]
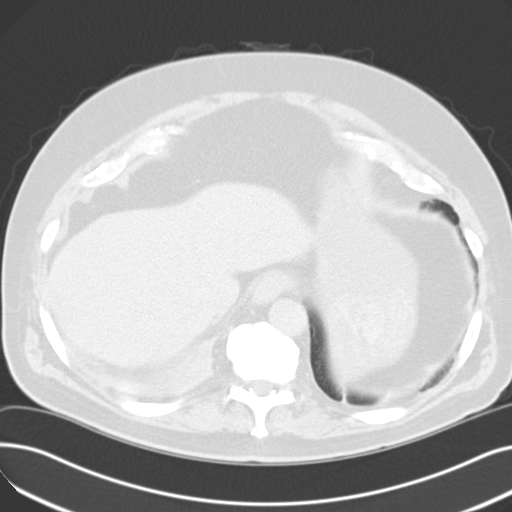
[im 22/65  mediastinal]
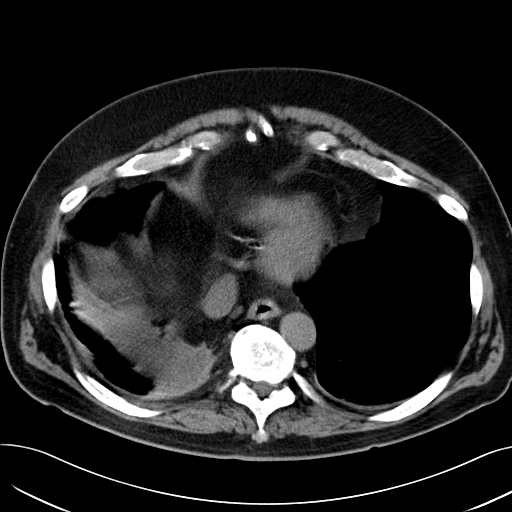
[im 22/65  lung]
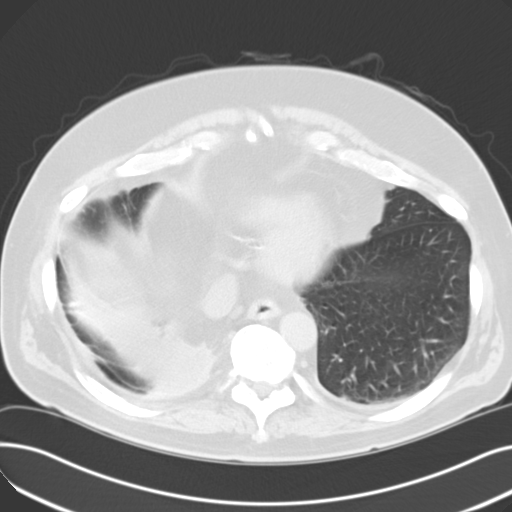
[im 26/65  lung]
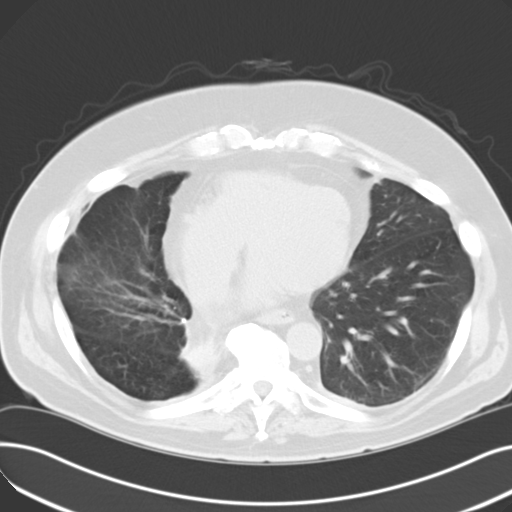
[im 29/65  lung]
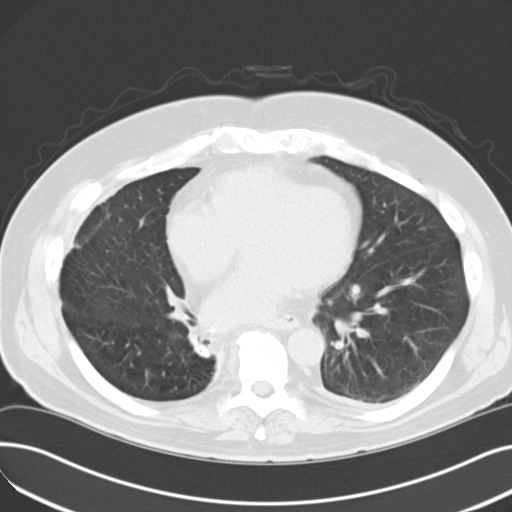
[im 34/65  lung]
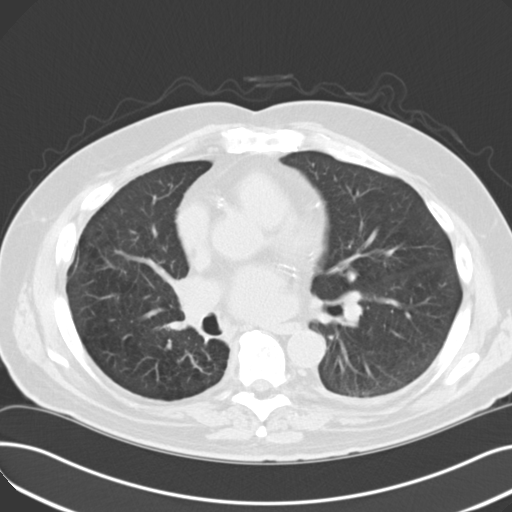
[im 36/65  mediastinal]
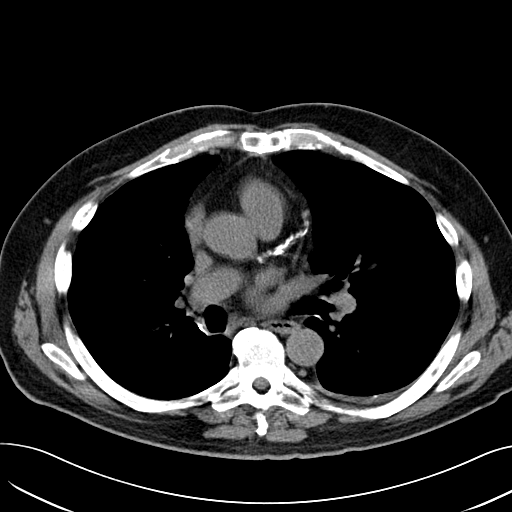
[im 36/65  lung]
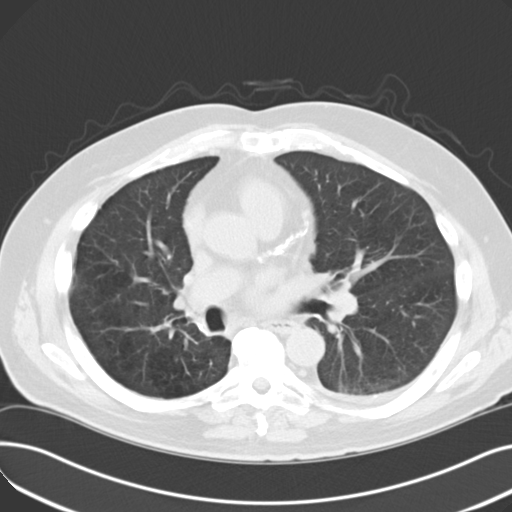
[im 39/65  lung]
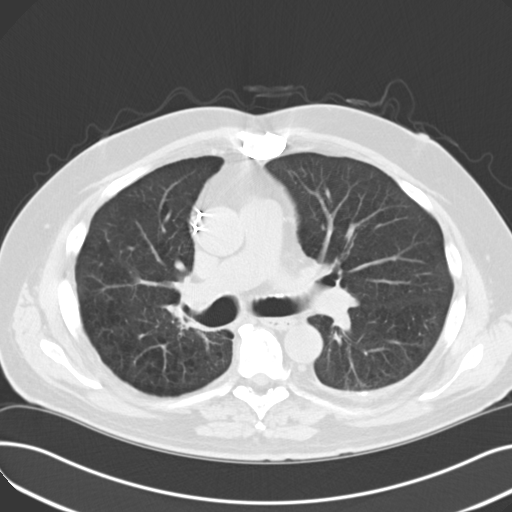
[im 43/65  lung]
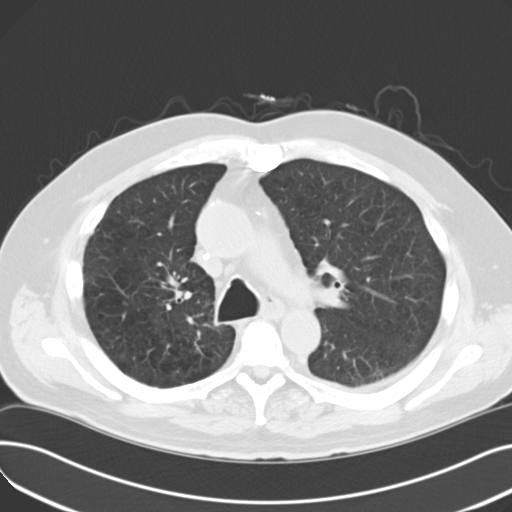
[im 48/65  lung]
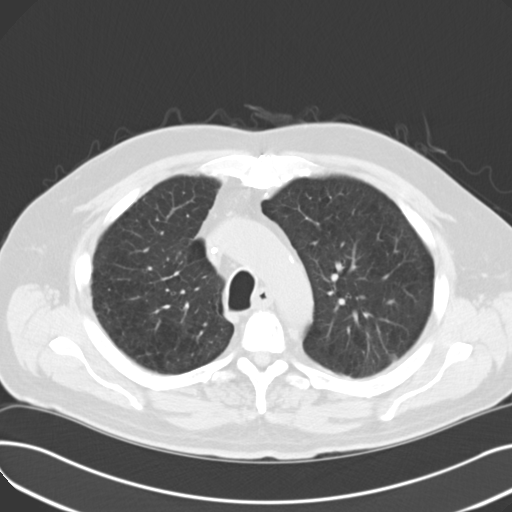
[im 52/65  mediastinal]
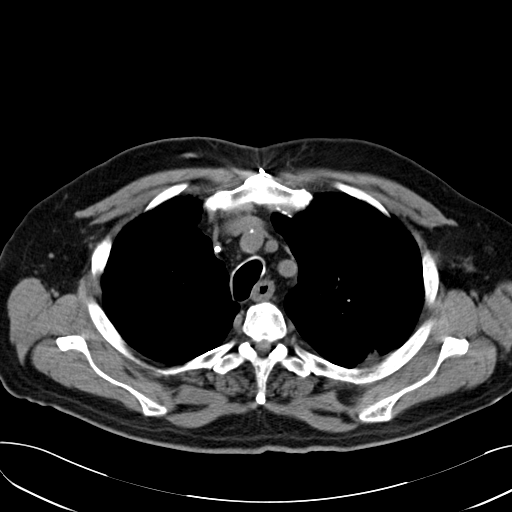
[im 52/65  lung]
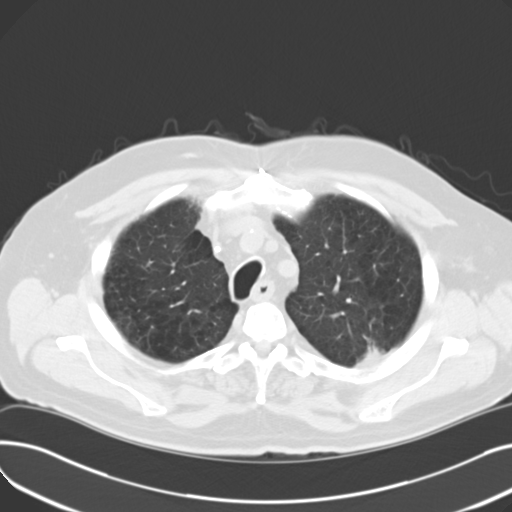
[im 55/65  lung]
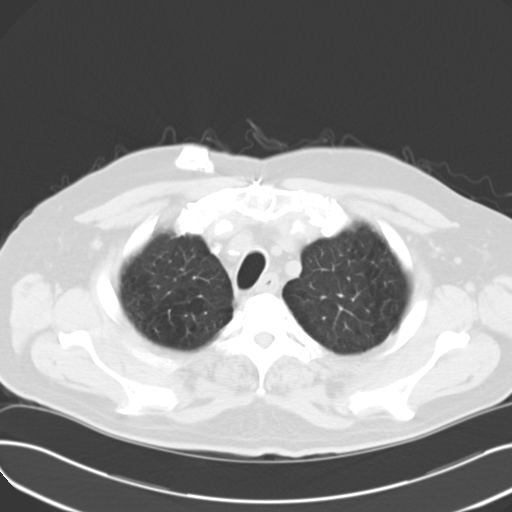
[im 60/65  lung]
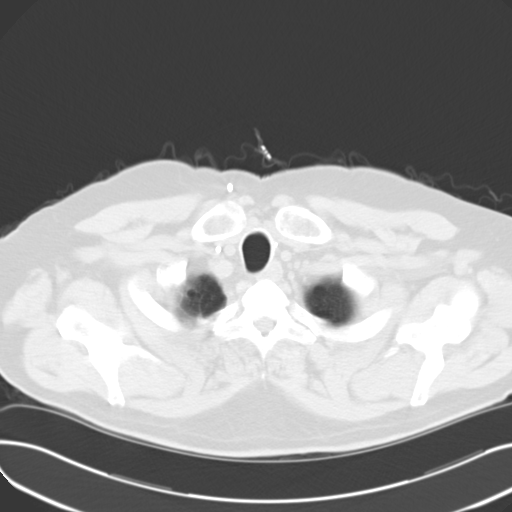

[15 of 33 positions shown; findings below may reference images not displayed]

FINDINGS: Mediastinum/Nodes: Clips indicating presumed thyroidectomy noted
without visualized mass lesion. Right-sided Port-A-Cath in place
with tip in the upper SVC. Evidence of right lower lobectomy without
mass lesion at the resection margin. Curvilinear right lower lobe
scarring/pleural thickening is subjectively stable. Assessment for
lymphadenopathy or hilar recurrence is suboptimal due to lack of
contrast. Moderate atheromatous aortic calcification without
aneurysm and coronary arterial atheromatous change. Great vessels
are normal in caliber.

Lungs/Pleura: Emphysematous changes reidentified. No pulmonary
nodule, mass, or consolidation.

Upper abdomen: Tiny dependent gallstones or sludge without other CT
evidence for acute cholecystitis. Right adrenal gland is normal. Fat
density left adrenal adenoma is stable.

Musculoskeletal: Disc degenerative change is noted. Evidence of
median sternotomy/ CABG. No lytic or sclerotic osseous lesion.
IMPRESSION: No CT evidence for intrathoracic recurrence or metastatic disease.

Emphysema.

## 2015-01-13 ENCOUNTER — Encounter: Payer: Self-pay | Admitting: Oncology

## 2015-01-13 ENCOUNTER — Inpatient Hospital Stay (HOSPITAL_BASED_OUTPATIENT_CLINIC_OR_DEPARTMENT_OTHER): Payer: Medicare Other | Admitting: Oncology

## 2015-01-13 VITALS — BP 125/86 | HR 75 | Temp 97.1°F | Resp 18 | Wt 194.7 lb

## 2015-01-13 DIAGNOSIS — C3410 Malignant neoplasm of upper lobe, unspecified bronchus or lung: Secondary | ICD-10-CM

## 2015-01-13 DIAGNOSIS — C73 Malignant neoplasm of thyroid gland: Secondary | ICD-10-CM | POA: Diagnosis not present

## 2015-01-13 DIAGNOSIS — Z79899 Other long term (current) drug therapy: Secondary | ICD-10-CM | POA: Diagnosis not present

## 2015-01-13 DIAGNOSIS — C3431 Malignant neoplasm of lower lobe, right bronchus or lung: Secondary | ICD-10-CM

## 2015-01-23 NOTE — Progress Notes (Signed)
[place]  Telephone:([phone]) [phone] [phone]) [phone]  ID: Nathaniel Lane OB: Nov 19, 1945  MR#: [id number]  JJO#:[id number]  Patient Care Team: Crecencio Mc, MD as PCP - General (Internal Medicine)  CHIEF COMPLAINT:  Chief Complaint  Patient presents with  . Follow-up    Lung Ca    INTERVAL HISTORY: Patient returns to clinic today for further evaluation.  He continues to feel well and is asymptomatic.  Patient continues to be active.  He has no neurologic complaints.  He denies any chest pain or shortness of breath. He has no recent fevers. He denies any nausea, vomiting, constipation, or diarrhea. He has no urinary complaints.  Patient offers no specific complaints today.  REVIEW OF SYSTEMS:   Review of Systems  Constitutional: Negative.   Respiratory: Negative.   Cardiovascular: Negative.   Neurological: Negative.     As per HPI. Otherwise, a complete review of systems is negatve.  PAST MEDICAL HISTORY: Past Medical History  Diagnosis Date  . Diabetes mellitus   . Hyperlipidemia   . Hypertension   . Myocardial infarction   . Cancer 2012    Stage IIA squamous cell Lung Ca  . Thyroid cancer 2013    s/p thyroidectomy 2014  . Gout     PAST SURGICAL HISTORY: Past Surgical History  Procedure Laterality Date  . Coronary artery bypass graft  2007  . Lung lobectomy    . Total thyroidectomy N/A     FAMILY HISTORY Family History  Problem Relation Age of Onset  . Hypertension Other   . Heart disease Father        ADVANCED DIRECTIVES:    HEALTH MAINTENANCE: History  Substance Use Topics  . Smoking status: Former Smoker    Quit date: 07/15/2005  . Smokeless tobacco: Never Used  . Alcohol Use: No     Colonoscopy:  PAP:  Bone density:  Lipid panel:  Allergies  Allergen Reactions  . Penicillins Swelling    Swelling in mouth    Current Outpatient Prescriptions  Medication Sig Dispense Refill  . aspirin 81 MG EC tablet TAKE 1  TABLET (81 MG TOTAL) BY MOUTH DAILY. SWALLOW WHOLE. 30 tablet 11  . atorvastatin (LIPITOR) 20 MG tablet Take 1 tablet (20 mg total) by mouth daily. 90 tablet 1  . enalapril (VASOTEC) 10 MG tablet TAKE 1 TABLET BY MOUTH EVERY DAY 90 tablet 1  . gabapentin (NEURONTIN) 100 MG capsule TAKE 1 CAPSULE (100 MG TOTAL) BY MOUTH AT BEDTIME. INCREASE DOSE WEEKLY BY 100 MG (1 CAPSULE) 90 capsule 1  . glipiZIDE (GLUCOTROL XL) 2.5 MG 24 hr tablet Take 1 tablet (2.5 mg total) by mouth daily with breakfast. 90 tablet 2  . levothyroxine (SYNTHROID, LEVOTHROID) 150 MCG tablet Take 150 mcg by mouth daily before breakfast.    . ranitidine (ZANTAC) 150 MG capsule Take 150 mg by mouth every evening.    . tamsulosin (FLOMAX) 0.4 MG CAPS capsule TAKE ONE CAPSULE BY MOUTH EVERY DAY 90 capsule 1   No current facility-administered medications for this visit.    OBJECTIVE: Filed Vitals:   01/13/15 1017  BP: 125/86  Pulse: 75  Temp: 97.1 F (36.2 C)  Resp: 18     Body mass index is 27.93 kg/(m^2).    ECOG FS:0 - Asymptomatic  General: Well-developed, well-nourished, no acute distress. HEENT: Normocephalic, moist mucous membranes, clear oropharnyx. Well-healed surgical scar without lymphadenopathy. Lungs: Clear to auscultation bilaterally. Heart: Regular rate and rhythm. No rubs, murmurs, or  gallops. Abdomen: Soft, nontender, nondistended. No organomegaly noted, normoactive bowel sounds. Musculoskeletal: No edema, cyanosis, or clubbing. Neuro: Alert, answering all questions appropriately. Cranial nerves grossly intact. Skin: No rashes or petechiae noted. Psych: Normal affect.    LAB RESULTS:  Lab Results  Component Value Date   NA 135 01/11/2015   K 5.0 01/11/2015   CL 103 01/11/2015   CO2 24 01/11/2015   GLUCOSE 240* 01/11/2015   BUN 36* 01/11/2015   CREATININE 1.85* 01/11/2015   CALCIUM 8.6* 01/11/2015   PROT 6.8 10/20/2014   ALBUMIN 4.0 10/20/2014   AST 19 10/20/2014   ALT 40 10/20/2014    ALKPHOS 128* 10/20/2014   BILITOT 0.4 10/20/2014   GFRNONAA 36* 01/11/2015   GFRAA 42* 01/11/2015    Lab Results  Component Value Date   WBC 7.9 01/05/2013   NEUTROABS 5.6 01/05/2013   HGB 14.4 01/05/2013   HCT 41.6 01/05/2013   MCV 89 01/05/2013   PLT 146* 01/05/2013     STUDIES: Ct Chest Wo Contrast  01/11/2015   CLINICAL DATA:  Lung cancer, squamous cell subtype, shortness of breath.  EXAM: CT CHEST WITHOUT CONTRAST  TECHNIQUE: Multidetector CT imaging of the chest was performed following the standard protocol without IV contrast.  COMPARISON:  01/07/2014  FINDINGS: Mediastinum/Nodes: Clips indicating presumed thyroidectomy noted without visualized mass lesion. Right-sided Port-A-Cath in place with tip in the upper SVC. Evidence of right lower lobectomy without mass lesion at the resection margin. Curvilinear right lower lobe scarring/pleural thickening is subjectively stable. Assessment for lymphadenopathy or hilar recurrence is suboptimal due to lack of contrast. Moderate atheromatous aortic calcification without aneurysm and coronary arterial atheromatous change. Great vessels are normal in caliber.  Lungs/Pleura: Emphysematous changes reidentified. No pulmonary nodule, mass, or consolidation.  Upper abdomen: Tiny dependent gallstones or sludge without other CT evidence for acute cholecystitis. Right adrenal gland is normal. Fat density left adrenal adenoma is stable.  Musculoskeletal: Disc degenerative change is noted. Evidence of median sternotomy/ CABG. No lytic or sclerotic osseous lesion.  IMPRESSION: No CT evidence for intrathoracic recurrence or metastatic disease.  Emphysema.   Electronically Signed   By: Conchita Paris M.D.   On: 01/11/2015 11:02    ASSESSMENT: Stage IIa squamous cell carcinoma of the lung, thyroid cancer, unknown stage.  PLAN:    1.  Lung cancer: CT results reviewed independently and reported as above with no obvious evidence of recurrence. Patient  completed his adjuvant chemotherapy in July 2013, therefore can be monitor with yearly CT scans. His next CT scan will occur in July of 2017. Return to clinic one to 2 days after his imaging for further evaluation.  2.  Thyroid cancer: Unclear stage, have not received any information regarding his treatment.  Patient reports that he is doing fine. 3. Renal insufficiency: Patient's creatinine appears to be approximately his baseline.   Patient expressed understanding and was in agreement with this plan. He also understands that He can call clinic at any time with any questions, concerns, or complaints.   No matching staging information was found for the patient.  Lloyd Huger, MD   01/23/2015 12:46 PM

## 2015-01-24 ENCOUNTER — Telehealth: Payer: Self-pay | Admitting: *Deleted

## 2015-01-24 MED ORDER — GLIPIZIDE 5 MG PO TABS: 2.5000 mg | ORAL_TABLET | Freq: Two times a day (BID) | ORAL | Status: DC

## 2015-01-24 NOTE — Telephone Encounter (Signed)
Short acting, twice daily glipizide 1/2 tablet twice daily,  sent to pharmacy

## 2015-01-24 NOTE — Telephone Encounter (Signed)
Pharmacy called states the Glipizide 2.'5mg'$  XR 24 hr is on manufacturer back order.  Please advise alternative.

## 2015-03-08 ENCOUNTER — Other Ambulatory Visit: Payer: Self-pay | Admitting: Internal Medicine

## 2015-03-12 ENCOUNTER — Other Ambulatory Visit: Payer: Self-pay | Admitting: Internal Medicine

## 2015-03-25 ENCOUNTER — Other Ambulatory Visit: Payer: Self-pay | Admitting: Internal Medicine

## 2015-04-20 ENCOUNTER — Ambulatory Visit (INDEPENDENT_AMBULATORY_CARE_PROVIDER_SITE_OTHER): Payer: Medicare Other | Admitting: Internal Medicine

## 2015-04-20 ENCOUNTER — Encounter: Payer: Self-pay | Admitting: Internal Medicine

## 2015-04-20 VITALS — BP 102/64 | HR 87 | Temp 97.7°F | Resp 12 | Ht 70.0 in | Wt 191.0 lb

## 2015-04-20 DIAGNOSIS — N183 Chronic kidney disease, stage 3 unspecified: Secondary | ICD-10-CM

## 2015-04-20 DIAGNOSIS — E785 Hyperlipidemia, unspecified: Secondary | ICD-10-CM

## 2015-04-20 DIAGNOSIS — I25810 Atherosclerosis of coronary artery bypass graft(s) without angina pectoris: Secondary | ICD-10-CM

## 2015-04-20 DIAGNOSIS — E1100 Type 2 diabetes mellitus with hyperosmolarity without nonketotic hyperglycemic-hyperosmolar coma (NKHHC): Secondary | ICD-10-CM | POA: Diagnosis not present

## 2015-04-20 NOTE — Progress Notes (Signed)
Pre-visit discussion using our clinic review tool. No additional management support is needed unless otherwise documented below in the visit note.  

## 2015-04-20 NOTE — Patient Instructions (Addendum)
You are doing well!  I'll see you in 6 months for your annual

## 2015-04-20 NOTE — Progress Notes (Signed)
Subjective:  [name], male    DOB: 1945/10/25  Age: 69 y.o. MRN: [id number]  CC: The primary encounter diagnosis was Type 2 diabetes mellitus with hyperosmolarity without coma, without long-term current use of insulin ([place]). Diagnoses of Coronary artery disease involving autologous vein coronary bypass graft without angina pectoris, CKD (chronic kidney disease) stage 3, GFR 30-59 ml/min, and Hyperlipidemia were also pertinent to this visit.  HPI Nathaniel Lane presents for follow up  On Type 2 DM with neuropathy, hyperlipidemia, and hypertension.  He feels generally well, is walking several times per week and checking blood sugars once daily at variable times.  BS have been under 130 fasting and < 150 post prandially.  Denies any recent hypoglyemic events.  Taking his medications as directed. Following a carbohydrate modified diet 6 days per week. Marland Kitchen Appetite is good.  Numbness comes and goes   Some recent episdes of constipation lasting several days,  Then spontaneously resolves without laxatives   No flu shot accepted.    Caregiver fatigue. his wife has dementia.  Has found a woman in is church who has provided  caregiving when e needs a break , has helped tremendously   Outpatient Prescriptions Prior to Visit  Medication Sig Dispense Refill  . aspirin 81 MG EC tablet TAKE 1 TABLET (81 MG TOTAL) BY MOUTH DAILY. SWALLOW WHOLE. 30 tablet 11  . atorvastatin (LIPITOR) 20 MG tablet Take 1 tablet (20 mg total) by mouth daily. 90 tablet 1  . enalapril (VASOTEC) 10 MG tablet TAKE 1 TABLET BY MOUTH EVERY DAY 90 tablet 1  . gabapentin (NEURONTIN) 100 MG capsule TAKE 1 CAPSULE (100 MG TOTAL) BY MOUTH AT BEDTIME. INCREASE DOSE WEEKLY BY 100 MG (1 CAPSULE) 90 capsule 1  . glipiZIDE (GLUCOTROL XL) 2.5 MG 24 hr tablet Take 1 tablet (2.5 mg total) by mouth daily with breakfast. 90 tablet 2  . levothyroxine (SYNTHROID, LEVOTHROID) 150 MCG tablet Take 150 mcg by mouth daily before breakfast.     . tamsulosin (FLOMAX) 0.4 MG CAPS capsule TAKE ONE CAPSULE BY MOUTH EVERY DAY 90 capsule 1  . enalapril (VASOTEC) 10 MG tablet TAKE 1 TABLET BY MOUTH EVERY DAY (Patient not taking: Reported on 04/20/2015) 90 tablet 4  . glipiZIDE (GLUCOTROL) 5 MG tablet Take 0.5 tablets (2.5 mg total) by mouth 2 (two) times daily before a meal. (Patient not taking: Reported on 04/20/2015) 30 tablet 3  . ranitidine (ZANTAC) 150 MG capsule Take 150 mg by mouth every evening.     No facility-administered medications prior to visit.    Review of Systems;  Patient denies headache, fevers, malaise, unintentional weight loss, skin rash, eye pain, sinus congestion and sinus pain, sore throat, dysphagia,  hemoptysis , cough, dyspnea, wheezing, chest pain, palpitations, orthopnea, edema, abdominal pain, nausea, melena, diarrhea, constipation, flank pain, dysuria, hematuria, urinary  Frequency, nocturia, numbness, tingling, seizures,  Focal weakness, Loss of consciousness,  Tremor, insomnia, depression, anxiety, and suicidal ideation.      Objective:  BP 102/64 mmHg  Pulse 87  Temp(Src) 97.7 F (36.5 C) (Oral)  Resp 12  Ht '5\' 10"'$  (1.778 m)  Wt 191 lb (86.637 kg)  BMI 27.41 kg/m2  SpO2 96%  BP Readings from Last 3 Encounters:  04/20/15 102/64  01/13/15 125/86  11/01/14 122/68    Wt Readings from Last 3 Encounters:  04/20/15 191 lb (86.637 kg)  01/13/15 194 lb 10.7 oz (88.301 kg)  11/01/14 190 lb 8 oz (86.41  kg)    General appearance: alert, cooperative and appears stated age Ears: normal TM's and external ear canals both ears Throat: lips, mucosa, and tongue normal; teeth and gums normal Neck: no adenopathy, no carotid bruit, supple, symmetrical, trachea midline and thyroid not enlarged, symmetric, no tenderness/mass/nodules Back: symmetric, no curvature. ROM normal. No CVA tenderness. Lungs: clear to auscultation bilaterally Heart: regular rate and rhythm, S1, S2 normal, no murmur, click, rub or  gallop Abdomen: soft, non-tender; bowel sounds normal; no masses,  no organomegaly Pulses: 2+ and symmetric Skin: Skin color, texture, turgor normal. No rashes or lesions Lymph nodes: Cervical, supraclavicular, and axillary nodes normal.  Lab Results  Component Value Date   HGBA1C 6.9* 04/20/2015   HGBA1C 6.3 10/20/2014   HGBA1C 6.9* 07/21/2014    Lab Results  Component Value Date   CREATININE 1.75* 04/20/2015   CREATININE 1.85* 01/11/2015   CREATININE 1.91* 11/01/2014    Lab Results  Component Value Date   WBC 7.9 01/05/2013   HGB 14.4 01/05/2013   HCT 41.6 01/05/2013   PLT 146* 01/05/2013   GLUCOSE 118* 04/20/2015   CHOL 147 10/20/2014   TRIG 231.0* 10/20/2014   HDL 31.30* 10/20/2014   LDLDIRECT 81.0 04/20/2015   LDLCALC 88 10/30/2013   ALT 30 04/20/2015   AST 22 04/20/2015   NA 139 04/20/2015   K 4.4 04/20/2015   CL 105 04/20/2015   CREATININE 1.75* 04/20/2015   BUN 26* 04/20/2015   CO2 27 04/20/2015   TSH 0.21* 10/20/2014   INR 1.2 09/27/2011   HGBA1C 6.9* 04/20/2015   MICROALBUR 4.2* 04/20/2015    Ct Chest Wo Contrast  01/11/2015  CLINICAL DATA:  Lung cancer, squamous cell subtype, shortness of breath. EXAM: CT CHEST WITHOUT CONTRAST TECHNIQUE: Multidetector CT imaging of the chest was performed following the standard protocol without IV contrast. COMPARISON:  01/07/2014 FINDINGS: Mediastinum/Nodes: Clips indicating presumed thyroidectomy noted without visualized mass lesion. Right-sided Port-A-Cath in place with tip in the upper SVC. Evidence of right lower lobectomy without mass lesion at the resection margin. Curvilinear right lower lobe scarring/pleural thickening is subjectively stable. Assessment for lymphadenopathy or hilar recurrence is suboptimal due to lack of contrast. Moderate atheromatous aortic calcification without aneurysm and coronary arterial atheromatous change. Great vessels are normal in caliber. Lungs/Pleura: Emphysematous changes  reidentified. No pulmonary nodule, mass, or consolidation. Upper abdomen: Tiny dependent gallstones or sludge without other CT evidence for acute cholecystitis. Right adrenal gland is normal. Fat density left adrenal adenoma is stable. Musculoskeletal: Disc degenerative change is noted. Evidence of median sternotomy/ CABG. No lytic or sclerotic osseous lesion. IMPRESSION: No CT evidence for intrathoracic recurrence or metastatic disease. Emphysema. Electronically Signed   By: Conchita Paris M.D.   On: 01/11/2015 11:02    Assessment & Plan:   Problem List Items Addressed This Visit    Hyperlipidemia    LDL and triglycerides are at goal on current medications. He has no side effects and liver enzymes are normal. No changes today   Lab Results  Component Value Date   CHOL 147 10/20/2014   HDL 31.30* 10/20/2014   LDLCALC 88 10/30/2013   LDLDIRECT 81.0 04/20/2015   TRIG 231.0* 10/20/2014   CHOLHDL 5 10/20/2014               CAD (coronary artery disease), autologous vein bypass graft    He has been asymptomatic, is taking his medications including daily aspirin, ACE inhibitor, and statin.  Continue semi annual follow up with cardiology.  CKD (chronic kidney disease) stage 3, GFR 30-59 ml/min    Renal function has been stable on ACE INhibitor,  He is avoiding NSAIDs and nephrotoxic agents.   Lab Results  Component Value Date   CREATININE 1.75* 04/20/2015  . Lab Results  Component Value Date   MICROALBUR 4.2* 04/20/2015            Other Visit Diagnoses    Type 2 diabetes mellitus with hyperosmolarity without coma, without long-term current use of insulin ([facility])    -  Primary    Relevant Orders    Comprehensive metabolic panel (Completed)    Hemoglobin A1c (Completed)    LDL cholesterol, direct (Completed)    Microalbumin / creatinine urine ratio (Completed)      A total of 25 minutes of face to face time was spent with patient more than half of which was spent  in counselling about the above mentioned conditions  and coordination of care  I am having Mr. Glatfelter maintain his enalapril, levothyroxine, ranitidine, aspirin, glipiZIDE, atorvastatin, glipiZIDE, enalapril, gabapentin, and tamsulosin.  No orders of the defined types were placed in this encounter.    There are no discontinued medications.  Follow-up: Return in about 6 months (around 10/19/2015).   Crecencio Mc, MD

## 2015-04-21 LAB — MICROALBUMIN / CREATININE URINE RATIO
Creatinine,U: 200.5 mg/dL
MICROALB UR: 4.2 mg/dL — AB (ref 0.0–1.9)
Microalb Creat Ratio: 2.1 mg/g (ref 0.0–30.0)

## 2015-04-21 LAB — COMPREHENSIVE METABOLIC PANEL
ALK PHOS: 104 U/L (ref 39–117)
ALT: 30 U/L (ref 0–53)
AST: 22 U/L (ref 0–37)
Albumin: 3.8 g/dL (ref 3.5–5.2)
BUN: 26 mg/dL — AB (ref 6–23)
CO2: 27 mEq/L (ref 19–32)
Calcium: 9.3 mg/dL (ref 8.4–10.5)
Chloride: 105 mEq/L (ref 96–112)
Creatinine, Ser: 1.75 mg/dL — ABNORMAL HIGH (ref 0.40–1.50)
GFR: 41.48 mL/min — AB (ref >60.00)
Glucose, Bld: 118 mg/dL — ABNORMAL HIGH (ref 70–99)
Potassium: 4.4 mEq/L (ref 3.5–5.1)
SODIUM: 139 meq/L (ref 135–145)
TOTAL PROTEIN: 6.9 g/dL (ref 6.0–8.3)
Total Bilirubin: 0.4 mg/dL (ref 0.2–1.2)

## 2015-04-21 LAB — HEMOGLOBIN A1C: Hgb A1c MFr Bld: 6.9 % — ABNORMAL HIGH (ref 4.6–6.5)

## 2015-04-21 LAB — LDL CHOLESTEROL, DIRECT: Direct LDL: 81 mg/dL

## 2015-04-23 NOTE — Assessment & Plan Note (Signed)
LDL and triglycerides are at goal on current medications. He has no side effects and liver enzymes are normal. No changes today   Lab Results  Component Value Date   CHOL 147 10/20/2014   HDL 31.30* 10/20/2014   LDLCALC 88 10/30/2013   LDLDIRECT 81.0 04/20/2015   TRIG 231.0* 10/20/2014   CHOLHDL 5 10/20/2014

## 2015-04-23 NOTE — Assessment & Plan Note (Signed)
Improved control with the addition of glipizide ER 2.5 mg in October for persistent hyperglycemia. He is taking an ACE I , high potency statin and ASA.  Neuropathy noted ,  Foot and eye exams are up to date.    Lab Results  Component Value Date   HGBA1C 6.9* 04/20/2015   Lab Results  Component Value Date   MICROALBUR 4.2* 04/20/2015   Lab Results  Component Value Date   CHOL 147 10/20/2014   HDL 31.30* 10/20/2014   LDLCALC 88 10/30/2013   LDLDIRECT 81.0 04/20/2015   TRIG 231.0* 10/20/2014   CHOLHDL 5 10/20/2014

## 2015-04-23 NOTE — Assessment & Plan Note (Signed)
Renal function has been stable on ACE INhibitor,  He is avoiding NSAIDs and nephrotoxic agents.   Lab Results  Component Value Date   CREATININE 1.75* 04/20/2015  . Lab Results  Component Value Date   MICROALBUR 4.2* 04/20/2015

## 2015-04-23 NOTE — Assessment & Plan Note (Signed)
He has been asymptomatic, is taking his medications including daily aspirin, ACE inhibitor, and statin.  Continue semi annual follow up with cardiology.

## 2015-04-25 ENCOUNTER — Encounter: Payer: Self-pay | Admitting: *Deleted

## 2015-05-06 ENCOUNTER — Other Ambulatory Visit: Payer: Self-pay | Admitting: Internal Medicine

## 2015-05-24 ENCOUNTER — Other Ambulatory Visit: Payer: Self-pay | Admitting: Internal Medicine

## 2015-06-29 ENCOUNTER — Other Ambulatory Visit: Payer: Self-pay | Admitting: Internal Medicine

## 2015-07-02 ENCOUNTER — Other Ambulatory Visit: Payer: Self-pay | Admitting: Internal Medicine

## 2015-07-13 DIAGNOSIS — E784 Other hyperlipidemia: Secondary | ICD-10-CM | POA: Diagnosis not present

## 2015-07-13 DIAGNOSIS — K219 Gastro-esophageal reflux disease without esophagitis: Secondary | ICD-10-CM | POA: Diagnosis not present

## 2015-07-13 DIAGNOSIS — N182 Chronic kidney disease, stage 2 (mild): Secondary | ICD-10-CM | POA: Diagnosis not present

## 2015-07-13 DIAGNOSIS — E119 Type 2 diabetes mellitus without complications: Secondary | ICD-10-CM | POA: Diagnosis not present

## 2015-07-13 DIAGNOSIS — Z85118 Personal history of other malignant neoplasm of bronchus and lung: Secondary | ICD-10-CM | POA: Diagnosis not present

## 2015-07-13 DIAGNOSIS — F17200 Nicotine dependence, unspecified, uncomplicated: Secondary | ICD-10-CM | POA: Diagnosis not present

## 2015-07-13 DIAGNOSIS — I2510 Atherosclerotic heart disease of native coronary artery without angina pectoris: Secondary | ICD-10-CM | POA: Diagnosis not present

## 2015-07-13 DIAGNOSIS — I509 Heart failure, unspecified: Secondary | ICD-10-CM | POA: Diagnosis not present

## 2015-07-13 DIAGNOSIS — R0602 Shortness of breath: Secondary | ICD-10-CM | POA: Diagnosis not present

## 2015-07-13 DIAGNOSIS — I5022 Chronic systolic (congestive) heart failure: Secondary | ICD-10-CM | POA: Diagnosis not present

## 2015-07-13 DIAGNOSIS — I10 Essential (primary) hypertension: Secondary | ICD-10-CM | POA: Diagnosis not present

## 2015-07-13 DIAGNOSIS — C3490 Malignant neoplasm of unspecified part of unspecified bronchus or lung: Secondary | ICD-10-CM | POA: Diagnosis not present

## 2015-07-31 ENCOUNTER — Other Ambulatory Visit: Payer: Self-pay | Admitting: Internal Medicine

## 2015-09-07 ENCOUNTER — Other Ambulatory Visit: Payer: Self-pay | Admitting: Internal Medicine

## 2015-09-18 ENCOUNTER — Other Ambulatory Visit: Payer: Self-pay | Admitting: Internal Medicine

## 2015-10-12 DIAGNOSIS — Z85850 Personal history of malignant neoplasm of thyroid: Secondary | ICD-10-CM | POA: Diagnosis not present

## 2015-10-12 DIAGNOSIS — E890 Postprocedural hypothyroidism: Secondary | ICD-10-CM | POA: Diagnosis not present

## 2015-10-19 DIAGNOSIS — Z85850 Personal history of malignant neoplasm of thyroid: Secondary | ICD-10-CM | POA: Diagnosis not present

## 2015-10-19 DIAGNOSIS — E890 Postprocedural hypothyroidism: Secondary | ICD-10-CM | POA: Diagnosis not present

## 2015-10-26 ENCOUNTER — Encounter: Payer: Self-pay | Admitting: Internal Medicine

## 2015-10-26 ENCOUNTER — Ambulatory Visit (INDEPENDENT_AMBULATORY_CARE_PROVIDER_SITE_OTHER): Payer: PPO | Admitting: Internal Medicine

## 2015-10-26 VITALS — BP 97/62 | HR 72 | Temp 97.5°F | Resp 12 | Ht 70.0 in | Wt 185.2 lb

## 2015-10-26 DIAGNOSIS — E119 Type 2 diabetes mellitus without complications: Secondary | ICD-10-CM | POA: Diagnosis not present

## 2015-10-26 DIAGNOSIS — N183 Chronic kidney disease, stage 3 unspecified: Secondary | ICD-10-CM

## 2015-10-26 DIAGNOSIS — Z125 Encounter for screening for malignant neoplasm of prostate: Secondary | ICD-10-CM

## 2015-10-26 DIAGNOSIS — E1121 Type 2 diabetes mellitus with diabetic nephropathy: Secondary | ICD-10-CM | POA: Diagnosis not present

## 2015-10-26 DIAGNOSIS — H2513 Age-related nuclear cataract, bilateral: Secondary | ICD-10-CM | POA: Diagnosis not present

## 2015-10-26 DIAGNOSIS — Z7289 Other problems related to lifestyle: Secondary | ICD-10-CM | POA: Diagnosis not present

## 2015-10-26 DIAGNOSIS — I10 Essential (primary) hypertension: Secondary | ICD-10-CM | POA: Diagnosis not present

## 2015-10-26 DIAGNOSIS — E785 Hyperlipidemia, unspecified: Secondary | ICD-10-CM | POA: Diagnosis not present

## 2015-10-26 DIAGNOSIS — H35033 Hypertensive retinopathy, bilateral: Secondary | ICD-10-CM | POA: Diagnosis not present

## 2015-10-26 DIAGNOSIS — Z0000 Encounter for general adult medical examination without abnormal findings: Secondary | ICD-10-CM

## 2015-10-26 LAB — LIPID PANEL
Cholesterol: 155 mg/dL (ref 0–200)
HDL: 30.9 mg/dL — AB (ref >39.00)
NonHDL: 123.86
TRIGLYCERIDES: 267 mg/dL — AB (ref 0.0–149.0)
Total CHOL/HDL Ratio: 5
VLDL: 53.4 mg/dL — AB (ref 0.0–40.0)

## 2015-10-26 LAB — COMPREHENSIVE METABOLIC PANEL
ALT: 31 U/L (ref 0–53)
AST: 20 U/L (ref 0–37)
Albumin: 4.2 g/dL (ref 3.5–5.2)
Alkaline Phosphatase: 120 U/L — ABNORMAL HIGH (ref 39–117)
BUN: 33 mg/dL — AB (ref 6–23)
CALCIUM: 9.6 mg/dL (ref 8.4–10.5)
CHLORIDE: 103 meq/L (ref 96–112)
CO2: 27 meq/L (ref 19–32)
CREATININE: 1.81 mg/dL — AB (ref 0.40–1.50)
GFR: 39.84 mL/min — ABNORMAL LOW (ref >60.00)
Glucose, Bld: 68 mg/dL — ABNORMAL LOW (ref 70–99)
POTASSIUM: 5 meq/L (ref 3.5–5.1)
SODIUM: 139 meq/L (ref 135–145)
Total Bilirubin: 0.5 mg/dL (ref 0.2–1.2)
Total Protein: 7.2 g/dL (ref 6.0–8.3)

## 2015-10-26 LAB — MICROALBUMIN / CREATININE URINE RATIO
Creatinine,U: 81.7 mg/dL
MICROALB UR: 2.5 mg/dL — AB (ref 0.0–1.9)
Microalb Creat Ratio: 3.1 mg/g (ref 0.0–30.0)

## 2015-10-26 LAB — HEMOGLOBIN A1C: HEMOGLOBIN A1C: 7.1 % — AB (ref 4.6–6.5)

## 2015-10-26 LAB — PSA, MEDICARE: PSA: 1.2 ng/mL (ref 0.10–4.00)

## 2015-10-26 LAB — LDL CHOLESTEROL, DIRECT: LDL DIRECT: 91 mg/dL

## 2015-10-26 MED ORDER — ENALAPRIL MALEATE 5 MG PO TABS: 5.0000 mg | ORAL_TABLET | Freq: Every day | ORAL | Status: DC

## 2015-10-26 NOTE — Progress Notes (Signed)
[name], male    DOB: 09/18/1945  Age: 70 y.o. MRN: [id number]  The patient is here for annual Medicare wellness examination and management of other chronic and acute problems.  Declines Zostavax Colonoscopy 2012  5  Mm  Polyp ue sept 2017 elliott Foot exam done today Overdue for eye exam    The risk factors are reflected in the social history.  The roster of all physicians providing medical care to patient - is listed in the Snapshot section of the chart.  Activities of daily living:  The patient is 100% independent in all ADLs: dressing, toileting, feeding as well as independent mobility  Home safety : The patient has smoke detectors in the home. They wear seatbelts.  There are no firearms at home. There is no violence in the home.   There is no risks for hepatitis, STDs or HIV. There is no   history of blood transfusion. They have no travel history to infectious disease endemic areas of the world.  The patient has seen their dentist in the last six month. They have seen their eye doctor in the last year. They admit to slight hearing difficulty with regard to whispered voices and some television programs.  They have deferred audiologic testing in the last year.  They do not  have excessive sun exposure. Discussed the need for sun protection: hats, long sleeves and use of sunscreen if there is significant sun exposure.   Diet: the importance of a healthy diet is discussed. They do have a healthy diet.  The benefits of regular aerobic exercise were discussed. She walks 4 times per week ,  20 minutes.   Depression screen: there are no signs or vegative symptoms of depression- irritability, change in appetite, anhedonia, sadness/tearfullness.  Cognitive assessment: the patient manages all their financial and personal affairs and is actively engaged. They could relate day,date,year and events; recalled 2/3 objects at 3 minutes; performed clock-face test normally.  The  following portions of the patient's history were reviewed and updated as appropriate: allergies, current medications, past family history, past medical history,  past surgical history, past social history  and problem list.  Visual acuity was not assessed per patient preference since she has regular follow up with her ophthalmologist. Hearing and body mass index were assessed and reviewed.   During the course of the visit the patient was educated and counseled about appropriate screening and preventive services including : fall prevention , diabetes screening, nutrition counseling, colorectal cancer screening, and recommended immunizations.    CC: The primary encounter diagnosis was Medicare annual wellness visit, subsequent. Diagnoses of Controlled type 2 diabetes mellitus with diabetic nephropathy, without long-term current use of insulin ([place]), Hyperlipidemia, Essential hypertension, Other problems related to lifestyle, Prostate cancer screening, and CKD (chronic kidney disease) stage 3, GFR 30-59 ml/min were also pertinent to this visit.  History Nathaniel Lane has a past medical history of Diabetes mellitus; Hyperlipidemia; Hypertension; Myocardial infarction ([place]); Cancer ([place]) (2012); Thyroid cancer ([place]) (2013); and Gout.   Nathaniel Lane has past surgical history that includes Coronary artery bypass graft (2007); Lung lobectomy; and Total thyroidectomy (N/A).   His family history includes Heart disease in his father; Hypertension in his other.Nathaniel Lane reports that Nathaniel Lane quit smoking about 10 years ago. Nathaniel Lane has never used smokeless tobacco. Nathaniel Lane reports that Nathaniel Lane does not drink alcohol or use illicit drugs.  Outpatient Prescriptions Prior to Visit  Medication Sig Dispense Refill  . aspirin 81 MG EC tablet TAKE 1 TABLET (  81 MG TOTAL) BY MOUTH DAILY. SWALLOW WHOLE. 30 tablet 11  . atorvastatin (LIPITOR) 20 MG tablet TAKE 1 TABLET (20 MG TOTAL) BY MOUTH DAILY. 90 tablet 1  . gabapentin (NEURONTIN) 100 MG capsule TAKE 1 CAPSULE  (100 MG TOTAL) BY MOUTH AT BEDTIME. INCREASE DOSE WEEKLY BY 100 MG (1 CAPSULE) 90 capsule 1  . glipiZIDE (GLUCOTROL) 5 MG tablet TAKE 1/2 TABLET (2.5 MG TOTAL) BY MOUTH 2 (TWO) TIMES DAILY BEFORE A MEAL. 30 tablet 3  . levothyroxine (SYNTHROID, LEVOTHROID) 150 MCG tablet Take 150 mcg by mouth daily before breakfast.    . tamsulosin (FLOMAX) 0.4 MG CAPS capsule TAKE ONE CAPSULE BY MOUTH EVERY DAY 90 capsule 1  . enalapril (VASOTEC) 10 MG tablet TAKE 1 TABLET BY MOUTH EVERY DAY 90 tablet 1  . ranitidine (ZANTAC) 150 MG capsule Take 150 mg by mouth every evening. Reported on 10/26/2015    . enalapril (VASOTEC) 10 MG tablet TAKE 1 TABLET BY MOUTH EVERY DAY (Patient not taking: Reported on 04/20/2015) 90 tablet 4  . glipiZIDE (GLUCOTROL XL) 2.5 MG 24 hr tablet Take 1 tablet (2.5 mg total) by mouth daily with breakfast. (Patient not taking: Reported on 10/26/2015) 90 tablet 2   No facility-administered medications prior to visit.    Review of Systems   Patient denies headache, fevers, malaise, unintentional weight loss, skin rash, eye pain, sinus congestion and sinus pain, sore throat, dysphagia,  hemoptysis , cough, dyspnea, wheezing, chest pain, palpitations, orthopnea, edema, abdominal pain, nausea, melena, diarrhea, constipation, flank pain, dysuria, hematuria, urinary  Frequency, nocturia, numbness, tingling, seizures,  Focal weakness, Loss of consciousness,  Tremor, insomnia, depression, anxiety, and suicidal ideation.     Objective:  BP 97/62 mmHg  Pulse 72  Temp(Src) 97.5 F (36.4 C) (Oral)  Resp 12  Ht '5\' 10"'$  (1.778 m)  Wt 185 lb 4 oz (84.029 kg)  BMI 26.58 kg/m2  SpO2 97%  Physical Exam  General appearance: alert, cooperative and appears stated age Ears: normal TM's and external ear canals both ears Throat: lips, mucosa, and tongue normal; teeth and gums normal Neck: no adenopathy, no carotid bruit, supple, symmetrical, trachea midline and thyroid not enlarged, symmetric, no  tenderness/mass/nodules Back: symmetric, no curvature. ROM normal. No CVA tenderness. Lungs: clear to auscultation bilaterally Heart: regular rate and rhythm, S1, S2 normal, no murmur, click, rub or gallop Abdomen: soft, non-tender; bowel sounds normal; no masses,  no organomegaly Pulses: 2+ and symmetric Skin: Skin color, texture, turgor normal. No rashes or lesions Lymph nodes: Cervical, supraclavicular, and axillary nodes normal. Foot exam:  Nails are well trimmed,  No callouses,  Sensation intact to microfilament    Assessment & Plan:   Problem List Items Addressed This Visit    Hypertension    Ramipril dose reduced today for low BP      Relevant Medications   enalapril (VASOTEC) 5 MG tablet   CKD (chronic kidney disease) stage 3, GFR 30-59 ml/min    Renal function has been stable on ACE INhibitor,  Nathaniel Lane is avoiding NSAIDs and nephrotoxic agents.   Lab Results  Component Value Date   CREATININE 1.81* 10/26/2015  . Lab Results  Component Value Date   MICROALBUR 2.5* 10/26/2015             Medicare annual wellness visit, subsequent - Primary    Annual Medicare wellness  exam was done as well as a comprehensive physical exam and management of acute and chronic conditions .  During the  course of the visit the patient was educated and counseled about appropriate screening and preventive services including : fall prevention , diabetes screening, nutrition counseling, colorectal cancer screening, and recommended immunizations.  Printed recommendations for health maintenance screenings was given.       Hyperlipidemia   Relevant Medications   enalapril (VASOTEC) 5 MG tablet   Other Relevant Orders   LDL cholesterol, direct (Completed)   Lipid panel (Completed)   Type 2 diabetes mellitus, controlled, with renal complications ([facility])   Relevant Medications   enalapril (VASOTEC) 5 MG tablet   Other Relevant Orders   Comprehensive metabolic panel (Completed)   Hemoglobin A1c  (Completed)   Microalbumin / creatinine urine ratio (Completed)    Other Visit Diagnoses    Other problems related to lifestyle        Relevant Orders    Hepatitis C antibody (Completed)    Prostate cancer screening        Relevant Orders    PSA, Medicare (Completed)       I have discontinued Nathaniel Lane enalapril. I have also changed his enalapril. Additionally, I am having him maintain his levothyroxine, ranitidine, atorvastatin, aspirin, gabapentin, glipiZIDE, and tamsulosin.  Meds ordered this encounter  Medications  . enalapril (VASOTEC) 5 MG tablet    Sig: Take 1 tablet (5 mg total) by mouth daily.    Dispense:  90 tablet    Refill:  1    Medications Discontinued During This Encounter  Medication Reason  . enalapril (VASOTEC) 10 MG tablet Duplicate  . enalapril (VASOTEC) 10 MG tablet Reorder  . glipiZIDE (GLUCOTROL XL) 2.5 MG 24 hr tablet     Follow-up: Return in about 6 months (around 04/27/2016) for follow up diabetes.   Crecencio Mc, MD

## 2015-10-26 NOTE — Patient Instructions (Addendum)
WE ARE REDUCING THE DOSE  OF ENALAPRIL TO  5 MG DAILY BECAUSE YOUR BLOOD PRESSURE HAS BEEN A LITTLE LOW   I AM SENDING A NEW RX SENT TO YOUR PHARMACY   YOU are due for a colonoscopy in September.  Referral to Dr Vira Agar is in progress   I will refill your medication  after I see your labs from today   Health Maintenance, Male A healthy lifestyle and preventative care can promote health and wellness.  Maintain regular health, dental, and eye exams.  Eat a healthy diet. Foods like vegetables, fruits, whole grains, low-fat dairy products, and lean protein foods contain the nutrients you need and are low in calories. Decrease your intake of foods high in solid fats, added sugars, and salt. Get information about a proper diet from your health care provider, if necessary.  Regular physical exercise is one of the most important things you can do for your health. Most adults should get at least 150 minutes of moderate-intensity exercise (any activity that increases your heart rate and causes you to sweat) each week. In addition, most adults need muscle-strengthening exercises on 2 or more days a week.   Maintain a healthy weight. The body mass index (BMI) is a screening tool to identify possible weight problems. It provides an estimate of body fat based on height and weight. Your health care provider can find your BMI and can help you achieve or maintain a healthy weight. For males 20 years and older:  A BMI below 18.5 is considered underweight.  A BMI of 18.5 to 24.9 is normal.  A BMI of 25 to 29.9 is considered overweight.  A BMI of 30 and above is considered obese.  Maintain normal blood lipids and cholesterol by exercising and minimizing your intake of saturated fat. Eat a balanced diet with plenty of fruits and vegetables. Blood tests for lipids and cholesterol should begin at age 36 and be repeated every 5 years. If your lipid or cholesterol levels are high, you are over age 52, or you are  at high risk for heart disease, you may need your cholesterol levels checked more frequently.Ongoing high lipid and cholesterol levels should be treated with medicines if diet and exercise are not working.  If you smoke, find out from your health care provider how to quit. If you do not use tobacco, do not start.  Lung cancer screening is recommended for adults aged 45-80 years who are at high risk for developing lung cancer because of a history of smoking. A yearly low-dose CT scan of the lungs is recommended for people who have at least a 30-pack-year history of smoking and are current smokers or have quit within the past 15 years. A pack year of smoking is smoking an average of 1 pack of cigarettes a day for 1 year (for example, a 30-pack-year history of smoking could mean smoking 1 pack a day for 30 years or 2 packs a day for 15 years). Yearly screening should continue until the smoker has stopped smoking for at least 15 years. Yearly screening should be stopped for people who develop a health problem that would prevent them from having lung cancer treatment.  If you choose to drink alcohol, do not have more than 2 drinks per day. One drink is considered to be 12 oz (360 mL) of beer, 5 oz (150 mL) of wine, or 1.5 oz (45 mL) of liquor.  Avoid the use of street drugs. Do not share needles  with anyone. Ask for help if you need support or instructions about stopping the use of drugs.  High blood pressure causes heart disease and increases the risk of stroke. High blood pressure is more likely to develop in:  People who have blood pressure in the end of the normal range (100-139/85-89 mm Hg).  People who are overweight or obese.  People who are African American.  If you are 61-65 years of age, have your blood pressure checked every 3-5 years. If you are 64 years of age or older, have your blood pressure checked every year. You should have your blood pressure measured twice--once when you are at a  hospital or clinic, and once when you are not at a hospital or clinic. Record the average of the two measurements. To check your blood pressure when you are not at a hospital or clinic, you can use:  An automated blood pressure machine at a pharmacy.  A home blood pressure monitor.  If you are 24-57 years old, ask your health care provider if you should take aspirin to prevent heart disease.  Diabetes screening involves taking a blood sample to check your fasting blood sugar level. This should be done once every 3 years after age 38 if you are at a normal weight and without risk factors for diabetes. Testing should be considered at a younger age or be carried out more frequently if you are overweight and have at least 1 risk factor for diabetes.  Colorectal cancer can be detected and often prevented. Most routine colorectal cancer screening begins at the age of 45 and continues through age 39. However, your health care provider may recommend screening at an earlier age if you have risk factors for colon cancer. On a yearly basis, your health care provider may provide home test kits to check for hidden blood in the stool. A small camera at the end of a tube may be used to directly examine the colon (sigmoidoscopy or colonoscopy) to detect the earliest forms of colorectal cancer. Talk to your health care provider about this at age 73 when routine screening begins. A direct exam of the colon should be repeated every 5-10 years through age 81, unless early forms of precancerous polyps or small growths are found.  People who are at an increased risk for hepatitis B should be screened for this virus. You are considered at high risk for hepatitis B if:  You were born in a country where hepatitis B occurs often. Talk with your health care provider about which countries are considered high risk.  Your parents were born in a high-risk country and you have not received a shot to protect against hepatitis B  (hepatitis B vaccine).  You have HIV or AIDS.  You use needles to inject street drugs.  You live with, or have sex with, someone who has hepatitis B.  You are a man who has sex with other men (MSM).  You get hemodialysis treatment.  You take certain medicines for conditions like cancer, organ transplantation, and autoimmune conditions.  Hepatitis C blood testing is recommended for all people born from [birth year] through 1963 and any individual with known risk factors for hepatitis C.  Healthy men should no longer receive prostate-specific antigen (PSA) blood tests as part of routine cancer screening. Talk to your health care provider about prostate cancer screening.  Testicular cancer screening is not recommended for adolescents or adult males who have no symptoms. Screening includes self-exam, a health care provider  exam, and other screening tests. Consult with your health care provider about any symptoms you have or any concerns you have about testicular cancer.  Practice safe sex. Use condoms and avoid high-risk sexual practices to reduce the spread of sexually transmitted infections (STIs).  You should be screened for STIs, including gonorrhea and chlamydia if:  You are sexually active and are younger than 24 years.  You are older than 24 years, and your health care provider tells you that you are at risk for this type of infection.  Your sexual activity has changed since you were last screened, and you are at an increased risk for chlamydia or gonorrhea. Ask your health care provider if you are at risk.  If you are at risk of being infected with HIV, it is recommended that you take a prescription medicine daily to prevent HIV infection. This is called pre-exposure prophylaxis (PrEP). You are considered at risk if:  You are a man who has sex with other men (MSM).  You are a heterosexual man who is sexually active with multiple partners.  You take drugs by injection.  You are  sexually active with a partner who has HIV.  Talk with your health care provider about whether you are at high risk of being infected with HIV. If you choose to begin PrEP, you should first be tested for HIV. You should then be tested every 3 months for as long as you are taking PrEP.  Use sunscreen. Apply sunscreen liberally and repeatedly throughout the day. You should seek shade when your shadow is shorter than you. Protect yourself by wearing long sleeves, pants, a wide-brimmed hat, and sunglasses year round whenever you are outdoors.  Tell your health care provider of new moles or changes in moles, especially if there is a change in shape or color. Also, tell your health care provider if a mole is larger than the size of a pencil eraser.  A one-time screening for abdominal aortic aneurysm (AAA) and surgical repair of large AAAs by ultrasound is recommended for men aged 85-75 years who are current or former smokers.  Stay current with your vaccines (immunizations).   This information is not intended to replace advice given to you by your health care provider. Make sure you discuss any questions you have with your health care provider.   Document Released: 12/08/2007 Document Revised: 07/02/2014 Document Reviewed: 11/06/2010 Elsevier Interactive Patient Education [employer].

## 2015-10-26 NOTE — Assessment & Plan Note (Signed)

## 2015-10-26 NOTE — Progress Notes (Signed)
Pre-visit discussion using our clinic review tool. No additional management support is needed unless otherwise documented below in the visit note.  

## 2015-10-26 NOTE — Assessment & Plan Note (Addendum)
Improved control with the CHANGE TO  glipizide  2.5 mg tow times  daily in [place] for persistent hyperglycemia. He is taking an ACE I , high potency statin and ASA.  Neuropathy noted ,  Foot and eye exams are up to date.    Lab Results  Component Value Date   HGBA1C 6.9* 04/20/2015   Lab Results  Component Value Date   MICROALBUR 4.2* 04/20/2015   Lab Results  Component Value Date   CHOL 147 10/20/2014   HDL 31.30* 10/20/2014   LDLCALC 88 10/30/2013   LDLDIRECT 81.0 04/20/2015   TRIG 231.0* 10/20/2014   CHOLHDL 5 10/20/2014

## 2015-10-27 LAB — HEPATITIS C ANTIBODY: HCV AB: NEGATIVE

## 2015-10-27 LAB — HM DIABETES EYE EXAM

## 2015-10-29 NOTE — Assessment & Plan Note (Signed)
Ramipril dose reduced today for low BP

## 2015-10-29 NOTE — Assessment & Plan Note (Signed)
Renal function has been stable on ACE INhibitor,  He is avoiding NSAIDs and nephrotoxic agents.   Lab Results  Component Value Date   CREATININE 1.81* 10/26/2015  . Lab Results  Component Value Date   MICROALBUR 2.5* 10/26/2015

## 2015-11-02 ENCOUNTER — Encounter: Payer: Self-pay | Admitting: *Deleted

## 2015-11-09 ENCOUNTER — Other Ambulatory Visit: Payer: Self-pay

## 2015-11-09 MED ORDER — GABAPENTIN 100 MG PO CAPS: ORAL_CAPSULE | ORAL | Status: DC

## 2015-12-26 ENCOUNTER — Other Ambulatory Visit: Payer: Self-pay

## 2015-12-26 MED ORDER — ATORVASTATIN CALCIUM 20 MG PO TABS: ORAL_TABLET | ORAL | Status: DC

## 2015-12-26 NOTE — Telephone Encounter (Signed)
Medication refilled

## 2016-01-11 ENCOUNTER — Other Ambulatory Visit: Payer: Self-pay

## 2016-01-11 MED ORDER — GABAPENTIN 100 MG PO CAPS: ORAL_CAPSULE | ORAL | Status: DC

## 2016-01-13 ENCOUNTER — Ambulatory Visit
Admission: RE | Admit: 2016-01-13 | Discharge: 2016-01-13 | Disposition: A | Discharge status: Home / Self Care | Payer: PPO | Source: Ambulatory Visit | Attending: Oncology | Admitting: Oncology

## 2016-01-13 DIAGNOSIS — J439 Emphysema, unspecified: Secondary | ICD-10-CM | POA: Insufficient documentation

## 2016-01-13 DIAGNOSIS — I700 Atherosclerosis of aorta: Secondary | ICD-10-CM | POA: Insufficient documentation

## 2016-01-13 DIAGNOSIS — I2510 Atherosclerotic heart disease of native coronary artery without angina pectoris: Secondary | ICD-10-CM | POA: Diagnosis not present

## 2016-01-13 DIAGNOSIS — D3502 Benign neoplasm of left adrenal gland: Secondary | ICD-10-CM | POA: Insufficient documentation

## 2016-01-13 DIAGNOSIS — K8020 Calculus of gallbladder without cholecystitis without obstruction: Secondary | ICD-10-CM | POA: Insufficient documentation

## 2016-01-13 DIAGNOSIS — C3491 Malignant neoplasm of unspecified part of right bronchus or lung: Secondary | ICD-10-CM | POA: Diagnosis not present

## 2016-01-13 DIAGNOSIS — C3410 Malignant neoplasm of upper lobe, unspecified bronchus or lung: Secondary | ICD-10-CM

## 2016-01-13 IMAGING — CT CT CHEST W/O CM
2 of 3 series · 17 of 46 positions shown, 19 images · non-contrast
Comparison: [DATE] and [DATE]

CLINICAL DATA: Followup right lung squamous cell carcinoma. Status
post right lobectomy, chemotherapy, and radiation therapy.

EXAM:
CT CHEST WITHOUT CONTRAST
TECHNIQUE: Multidetector CT imaging of the chest was performed following the
standard protocol without IV contrast.

[Series 2: routine chest wo · axial · 0.74mm/px · z∈[-248,+56]mm · 14 of 71 slices shown, 16 images]
[im 5/71  soft-tissue]
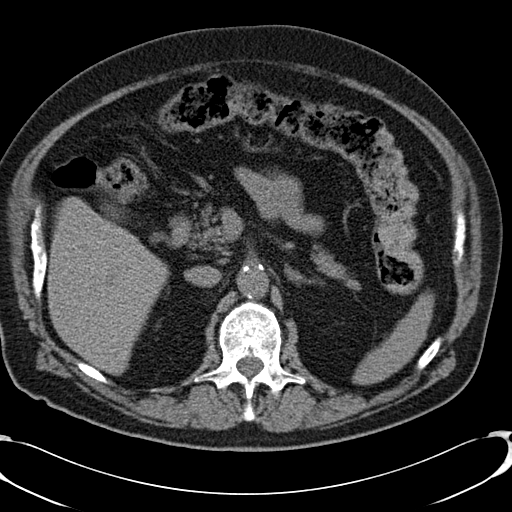
[im 5/71  bone]
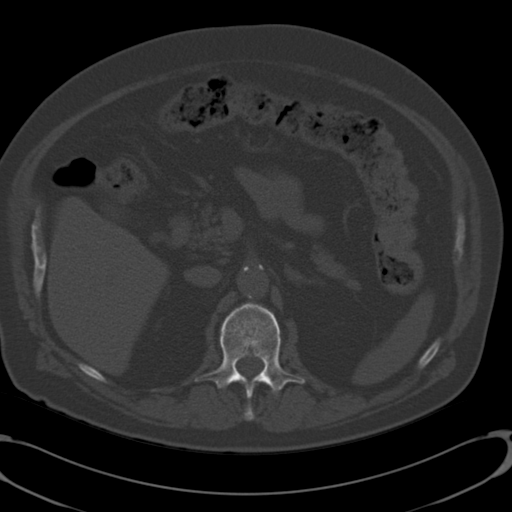
[im 10/71  soft-tissue]
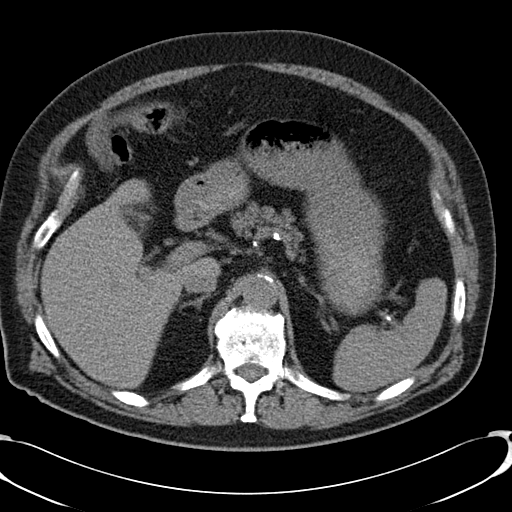
[im 14/71  soft-tissue]
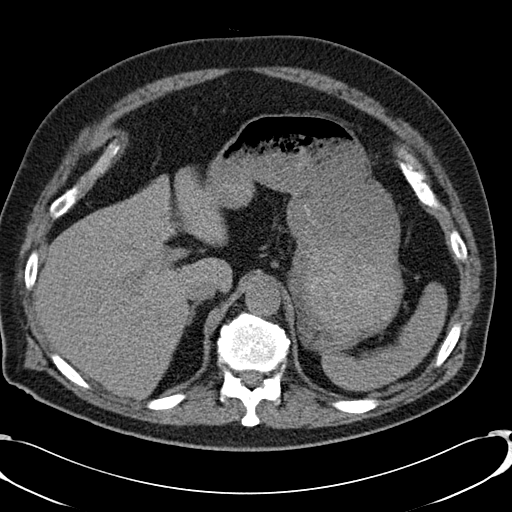
[im 19/71  soft-tissue]
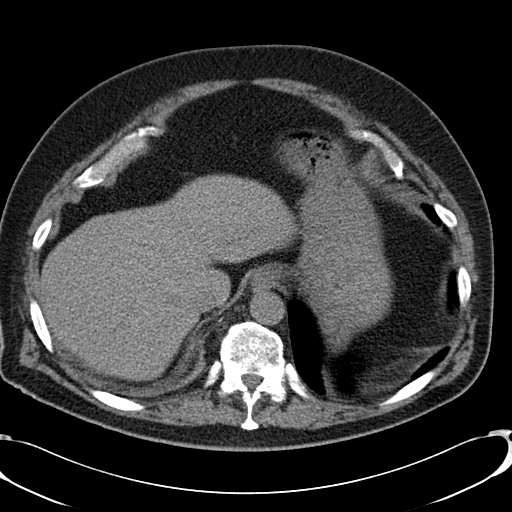
[im 23/71  soft-tissue]
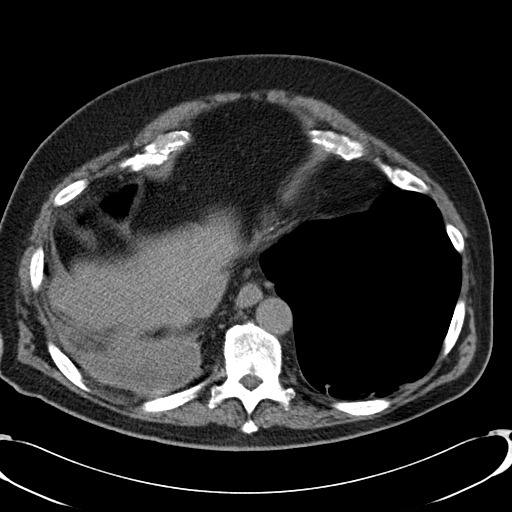
[im 28/71  soft-tissue]
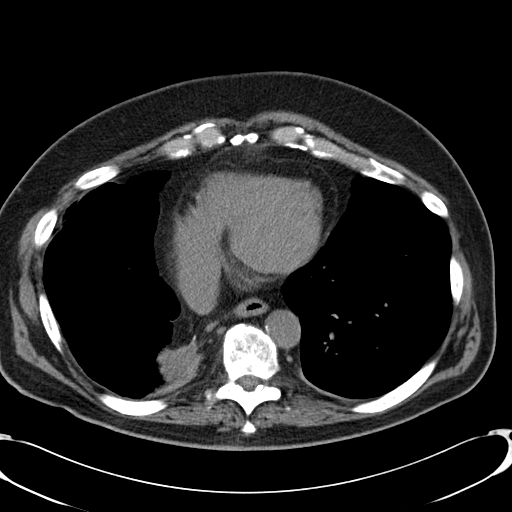
[im 32/71  soft-tissue]
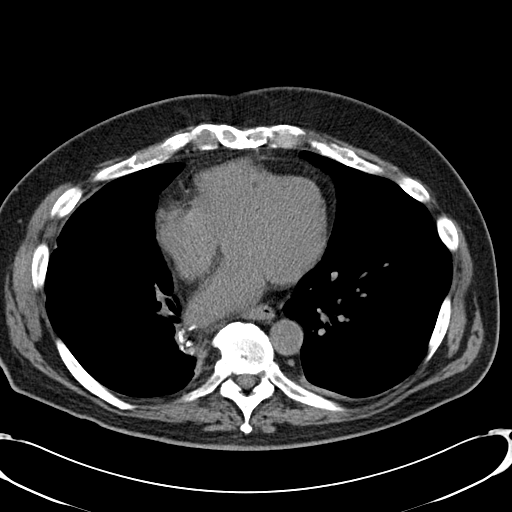
[im 39/71  soft-tissue]
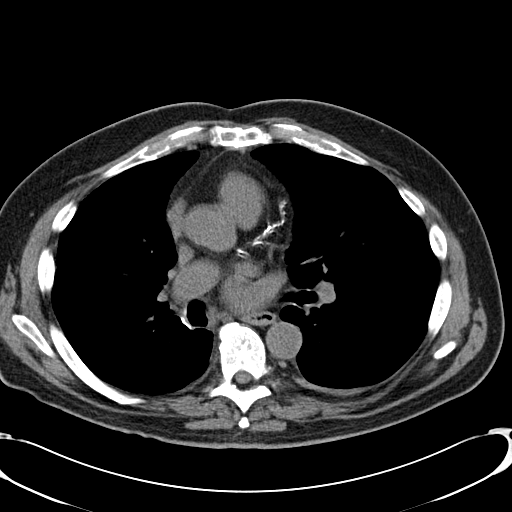
[im 43/71  soft-tissue]
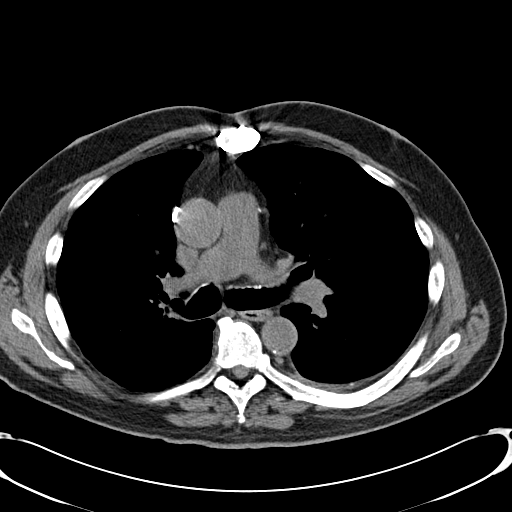
[im 43/71  bone]
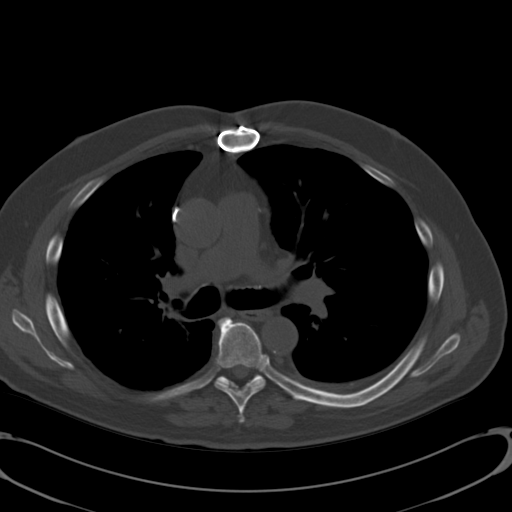
[im 48/71  soft-tissue]
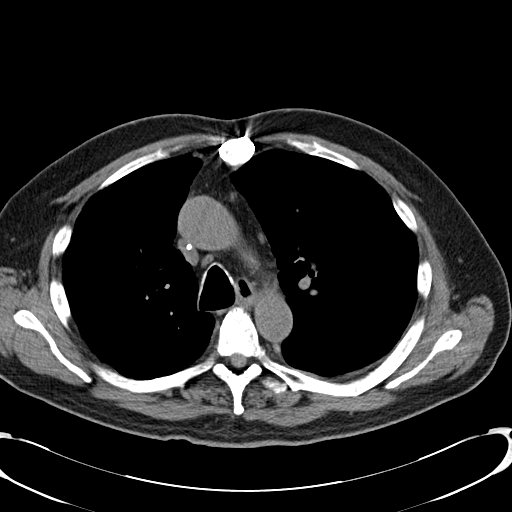
[im 52/71  soft-tissue]
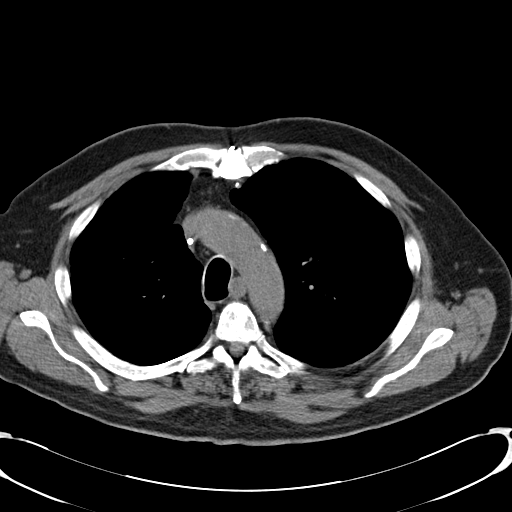
[im 57/71  soft-tissue]
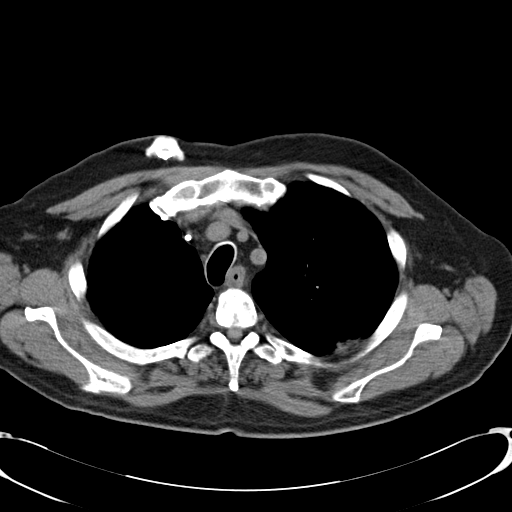
[im 61/71  soft-tissue]
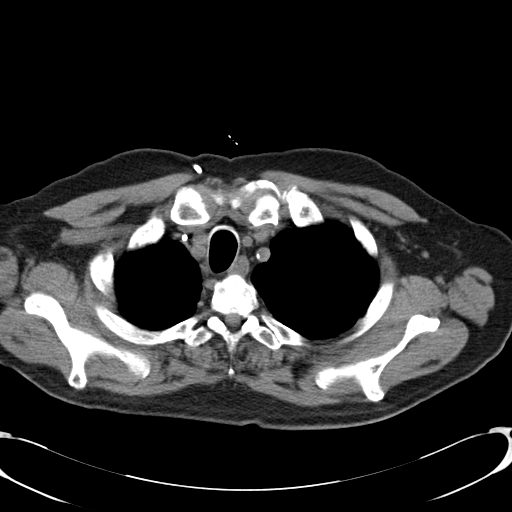
[im 66/71  soft-tissue]
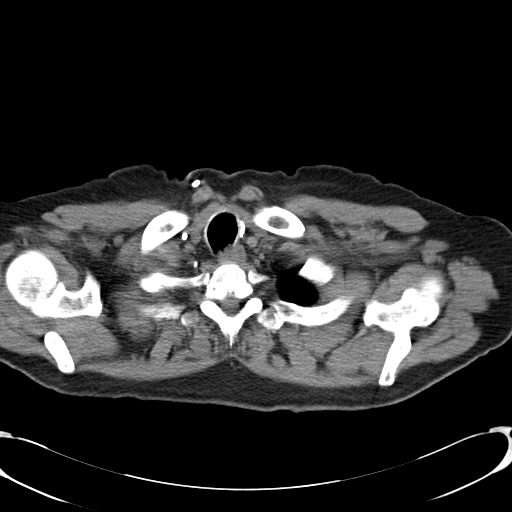

[Series 5: routine chest wo cor · coronal · 0.69mm/px · 3 of 143 slices shown]
[im 48/143  soft-tissue]
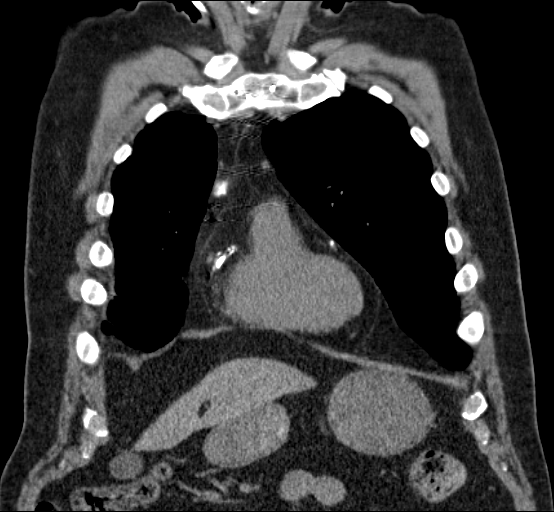
[im 64/143  soft-tissue]
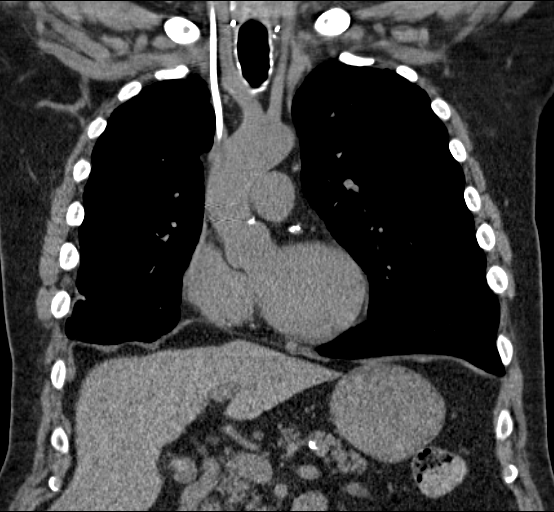
[im 79/143  soft-tissue]
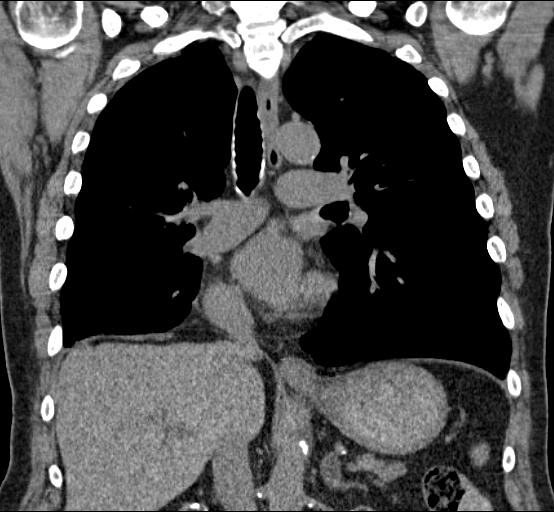

[17 of 46 positions shown; findings below may reference images not displayed]

FINDINGS: Cardiovascular: Normal heart size. Aortic atherosclerosis noted.
Three-vessel coronary artery calcification also demonstrated.

Mediastinum/Nodes: No lymphadenopathy or other soft tissue masses
identified on this unenhanced exam. Previous thyroidectomy.

Lungs/Pleura: Stable postop changes seen in the posterior right lung
base. Other areas of pleural- parenchymal scarring in the posterior
left upper and lower lobes remain stable. Mild to moderate emphysema
again noted. No suspicious pulmonary nodules or masses identified.
No evidence of acute infiltrate or pleural effusion.

Upper Abdomen: Stable tiny benign left adrenal adenoma. Tiny
calcified gallstones noted, without evidence of cholecystitis.

Musculoskeletal: No suspicious bone lesions identified.
IMPRESSION: Stable exam. No evidence of recurrent or metastatic carcinoma within
the thorax.

Emphysema.

Aortic atherosclerosis and coronary artery calcification noted.
Stable cholelithiasis and small benign left adrenal adenoma.

## 2016-01-16 ENCOUNTER — Other Ambulatory Visit: Payer: Self-pay | Admitting: Surgical

## 2016-01-16 MED ORDER — GLIPIZIDE 5 MG PO TABS: ORAL_TABLET | ORAL | 3 refills | Status: DC

## 2016-01-19 ENCOUNTER — Other Ambulatory Visit: Payer: Medicare Other

## 2016-01-19 ENCOUNTER — Inpatient Hospital Stay: Payer: PPO | Attending: Oncology | Admitting: Oncology

## 2016-01-19 VITALS — BP 146/80 | HR 72 | Temp 95.0°F | Resp 18 | Wt 184.0 lb

## 2016-01-19 DIAGNOSIS — Z87891 Personal history of nicotine dependence: Secondary | ICD-10-CM | POA: Diagnosis not present

## 2016-01-19 DIAGNOSIS — Z9221 Personal history of antineoplastic chemotherapy: Secondary | ICD-10-CM | POA: Diagnosis not present

## 2016-01-19 DIAGNOSIS — N289 Disorder of kidney and ureter, unspecified: Secondary | ICD-10-CM

## 2016-01-19 DIAGNOSIS — Z85118 Personal history of other malignant neoplasm of bronchus and lung: Secondary | ICD-10-CM

## 2016-01-19 DIAGNOSIS — Z85850 Personal history of malignant neoplasm of thyroid: Secondary | ICD-10-CM

## 2016-01-19 DIAGNOSIS — I10 Essential (primary) hypertension: Secondary | ICD-10-CM | POA: Diagnosis not present

## 2016-01-19 DIAGNOSIS — C3431 Malignant neoplasm of lower lobe, right bronchus or lung: Secondary | ICD-10-CM

## 2016-01-19 DIAGNOSIS — Z79899 Other long term (current) drug therapy: Secondary | ICD-10-CM | POA: Diagnosis not present

## 2016-01-19 DIAGNOSIS — I252 Old myocardial infarction: Secondary | ICD-10-CM | POA: Diagnosis not present

## 2016-01-19 DIAGNOSIS — M109 Gout, unspecified: Secondary | ICD-10-CM | POA: Insufficient documentation

## 2016-01-19 DIAGNOSIS — E119 Type 2 diabetes mellitus without complications: Secondary | ICD-10-CM | POA: Insufficient documentation

## 2016-01-19 DIAGNOSIS — E785 Hyperlipidemia, unspecified: Secondary | ICD-10-CM | POA: Diagnosis not present

## 2016-01-19 DIAGNOSIS — C3490 Malignant neoplasm of unspecified part of unspecified bronchus or lung: Secondary | ICD-10-CM

## 2016-01-19 NOTE — Progress Notes (Signed)
States has occasional shortness of breath with exertion. Feeling well today.

## 2016-01-22 NOTE — Progress Notes (Signed)
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  He continues to feel well and is asymptomatic.  Patient continues to be active.  He has no neurologic complaints. He denies any recent fevers or illnesses. He has occasional dyspnea on exertion, but denies any chest pain. He denies any nausea, vomiting, constipation, or diarrhea. He has no urinary complaints. He has no urinary complaints. Patient offers no specific complaints today.  REVIEW OF SYSTEMS:   Review of Systems  Constitutional: Negative.  Negative for fever, malaise/fatigue and weight loss.  Respiratory: Positive for shortness of breath. Negative for cough.   Cardiovascular: Negative.  Negative for chest pain.  Gastrointestinal: Negative.  Negative for abdominal pain.  Genitourinary: Negative.   Musculoskeletal: Negative.   Neurological: Negative.  Negative for weakness.  Psychiatric/Behavioral: Negative.  The patient is not nervous/anxious.     As per HPI. Otherwise, a complete review of systems is negatve.  PAST MEDICAL HISTORY: Past Medical History:  Diagnosis Date  . Cancer ([place]) 2012   Stage IIA squamous cell Lung Ca  . Diabetes mellitus   . Gout   . Hyperlipidemia   . Hypertension   . Myocardial infarction ([place])   . Thyroid cancer ([place]) 2013   s/p thyroidectomy 2014    PAST SURGICAL HISTORY: Past Surgical History:  Procedure Laterality Date  . CORONARY ARTERY BYPASS GRAFT  2007  . LUNG LOBECTOMY    . TOTAL THYROIDECTOMY N/A     FAMILY HISTORY Family History  Problem Relation Age of Onset  . Hypertension Other   . Heart disease Father         ADVANCED DIRECTIVES:    HEALTH MAINTENANCE: Social History  Substance Use Topics  . Smoking status: Former Smoker    Quit date: 07/15/2005  . Smokeless tobacco: Never Used  . Alcohol use No     Colonoscopy:  PAP:  Bone density:  Lipid panel:  Allergies  Allergen Reactions  . Penicillins Swelling    Swelling in mouth    Current Outpatient Prescriptions  Medication Sig Dispense Refill  . aspirin 81 MG EC tablet TAKE 1 TABLET (81 MG TOTAL) BY MOUTH DAILY. SWALLOW WHOLE. 30 tablet 11  . atorvastatin (LIPITOR) 20 MG tablet TAKE 1 TABLET (20 MG TOTAL) BY MOUTH DAILY. 90 tablet 1  . enalapril (VASOTEC) 5 MG tablet Take 1 tablet (5 mg total) by mouth daily. 90 tablet 1  . gabapentin (NEURONTIN) 100 MG capsule TAKE 1 CAPSULE (100 MG TOTAL) BY MOUTH AT BEDTIME. INCREASE DOSE WEEKLY BY 100 MG (1 CAPSULE) 90 capsule 1  . glipiZIDE (GLUCOTROL) 5 MG tablet TAKE 1/2 TABLET (2.5 MG TOTAL) BY MOUTH 2 (TWO) TIMES DAILY BEFORE A MEAL. 30 tablet 3  . levothyroxine (SYNTHROID, LEVOTHROID) 137 MCG tablet Take 137 mcg by mouth daily before breakfast.    . tamsulosin (FLOMAX) 0.4 MG CAPS capsule TAKE ONE CAPSULE BY MOUTH EVERY DAY 90 capsule 1   No current facility-administered medications for this visit.     OBJECTIVE: Vitals:   01/19/16 0959  BP: (!) 146/80  Pulse: 72  Resp: 18  Temp: (!) 95 F (35 C)  Body mass index is 26.4 kg/m.    ECOG FS:0 - Asymptomatic  General: Well-developed, well-nourished, no acute distress. HEENT: Normocephalic, moist mucous membranes, clear oropharnyx. Well-healed surgical scar without lymphadenopathy. Lungs: Clear to auscultation bilaterally. Heart: Regular rate and rhythm. No rubs, murmurs, or gallops. Abdomen: Soft, nontender, nondistended. No organomegaly noted, normoactive bowel sounds. Musculoskeletal: No edema, cyanosis, or clubbing. Neuro: Alert, answering all questions appropriately. Cranial nerves grossly intact. Skin: No rashes or  petechiae noted. Psych: Normal affect.    LAB RESULTS:  Lab Results  Component Value Date   NA 139 10/26/2015   K 5.0 10/26/2015   CL 103 10/26/2015   CO2 27 10/26/2015   GLUCOSE 68 (L) 10/26/2015   BUN 33 (H) 10/26/2015   CREATININE 1.81 (H) 10/26/2015   CALCIUM 9.6 10/26/2015   PROT 7.2 10/26/2015   ALBUMIN 4.2 10/26/2015   AST 20 10/26/2015   ALT 31 10/26/2015   ALKPHOS 120 (H) 10/26/2015   BILITOT 0.5 10/26/2015   GFRNONAA 36 (L) 01/11/2015   GFRAA 42 (L) 01/11/2015    Lab Results  Component Value Date   WBC 7.9 01/05/2013   NEUTROABS 5.6 01/05/2013   HGB 14.4 01/05/2013   HCT 41.6 01/05/2013   MCV 89 01/05/2013   PLT 146 (L) 01/05/2013     STUDIES: Ct Chest Wo Contrast  Result Date: 01/13/2016 CLINICAL DATA:  Followup right lung squamous cell carcinoma. Status post right lobectomy, chemotherapy, and radiation therapy. EXAM: CT CHEST WITHOUT CONTRAST TECHNIQUE: Multidetector CT imaging of the chest was performed following the standard protocol without IV contrast. COMPARISON:  01/11/2015 and 01/07/2014 FINDINGS: Cardiovascular: Normal heart size. Aortic atherosclerosis noted. Three-vessel coronary artery calcification also demonstrated. Mediastinum/Nodes: No lymphadenopathy or other soft tissue masses identified on this unenhanced exam. Previous thyroidectomy. Lungs/Pleura: Stable postop changes seen in the posterior right lung base. Other areas of pleural- parenchymal scarring in the posterior left upper and lower lobes remain stable. Mild to moderate emphysema again noted. No suspicious pulmonary nodules or masses identified. No evidence of acute infiltrate or pleural effusion. Upper Abdomen: Stable tiny benign left adrenal adenoma. Tiny calcified gallstones noted, without evidence of cholecystitis. Musculoskeletal: No suspicious bone lesions identified. IMPRESSION: Stable exam. No evidence of recurrent or metastatic carcinoma within the thorax. Emphysema. Aortic  atherosclerosis and coronary artery calcification noted. Stable cholelithiasis and small benign left adrenal adenoma. Electronically Signed   By: Earle Gell M.D.   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  PLAN:    1.  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 Patient completed his adjuvant chemotherapy in July 2013, therefore can be monitor with yearly CT scans. Return to clinic in 1 year with repeat CT scan and further evaluation. If patient has no evidence of recurrence, he likely can be discharged from clinic. 2.  Thyroid cancer: Unclear stage, have not received any information regarding his treatment.  Patient reports that he is doing fine. 3. Renal insufficiency: Patient's creatinine appears to be approximately his baseline.  Patient expressed understanding and was in agreement with this plan. He also understands that He can call clinic at any time with any questions, concerns, or complaints.    Lloyd Huger, MD   01/22/2016 8:16 AM

## 2016-02-15 DIAGNOSIS — I2510 Atherosclerotic heart disease of native coronary artery without angina pectoris: Secondary | ICD-10-CM | POA: Diagnosis not present

## 2016-02-15 DIAGNOSIS — I509 Heart failure, unspecified: Secondary | ICD-10-CM | POA: Diagnosis not present

## 2016-02-15 DIAGNOSIS — I5022 Chronic systolic (congestive) heart failure: Secondary | ICD-10-CM | POA: Diagnosis not present

## 2016-02-15 DIAGNOSIS — R0602 Shortness of breath: Secondary | ICD-10-CM | POA: Diagnosis not present

## 2016-02-15 DIAGNOSIS — Z86010 Personal history of colonic polyps: Secondary | ICD-10-CM | POA: Diagnosis not present

## 2016-02-15 DIAGNOSIS — K219 Gastro-esophageal reflux disease without esophagitis: Secondary | ICD-10-CM | POA: Diagnosis not present

## 2016-02-15 DIAGNOSIS — Z800 Family history of malignant neoplasm of digestive organs: Secondary | ICD-10-CM | POA: Diagnosis not present

## 2016-02-15 DIAGNOSIS — Z85038 Personal history of other malignant neoplasm of large intestine: Secondary | ICD-10-CM | POA: Diagnosis not present

## 2016-02-15 DIAGNOSIS — E119 Type 2 diabetes mellitus without complications: Secondary | ICD-10-CM | POA: Diagnosis not present

## 2016-02-15 DIAGNOSIS — E784 Other hyperlipidemia: Secondary | ICD-10-CM | POA: Diagnosis not present

## 2016-02-15 DIAGNOSIS — I10 Essential (primary) hypertension: Secondary | ICD-10-CM | POA: Diagnosis not present

## 2016-02-15 DIAGNOSIS — N182 Chronic kidney disease, stage 2 (mild): Secondary | ICD-10-CM | POA: Diagnosis not present

## 2016-02-15 DIAGNOSIS — Z87891 Personal history of nicotine dependence: Secondary | ICD-10-CM | POA: Diagnosis not present

## 2016-02-15 DIAGNOSIS — Z85118 Personal history of other malignant neoplasm of bronchus and lung: Secondary | ICD-10-CM | POA: Diagnosis not present

## 2016-03-11 ENCOUNTER — Other Ambulatory Visit: Payer: Self-pay | Admitting: Internal Medicine

## 2016-03-12 ENCOUNTER — Other Ambulatory Visit: Payer: Self-pay | Admitting: Internal Medicine

## 2016-04-11 DIAGNOSIS — E890 Postprocedural hypothyroidism: Secondary | ICD-10-CM | POA: Diagnosis not present

## 2016-04-11 DIAGNOSIS — Z85850 Personal history of malignant neoplasm of thyroid: Secondary | ICD-10-CM | POA: Diagnosis not present

## 2016-04-21 ENCOUNTER — Other Ambulatory Visit: Payer: Self-pay | Admitting: Internal Medicine

## 2016-04-24 ENCOUNTER — Encounter: Payer: Self-pay | Admitting: *Deleted

## 2016-04-25 ENCOUNTER — Encounter
Admission: RE | Disposition: A | Discharge status: Home / Self Care | Payer: Self-pay | Source: Ambulatory Visit | Attending: Unknown Physician Specialty

## 2016-04-25 ENCOUNTER — Ambulatory Visit: Payer: PPO | Admitting: Anesthesiology

## 2016-04-25 ENCOUNTER — Ambulatory Visit
Admission: RE | Admit: 2016-04-25 | Discharge: 2016-04-25 | Disposition: A | Discharge status: Home / Self Care | Payer: PPO | Source: Ambulatory Visit | Attending: Unknown Physician Specialty | Admitting: Unknown Physician Specialty

## 2016-04-25 DIAGNOSIS — K649 Unspecified hemorrhoids: Secondary | ICD-10-CM | POA: Diagnosis not present

## 2016-04-25 DIAGNOSIS — E785 Hyperlipidemia, unspecified: Secondary | ICD-10-CM | POA: Insufficient documentation

## 2016-04-25 DIAGNOSIS — D120 Benign neoplasm of cecum: Secondary | ICD-10-CM | POA: Insufficient documentation

## 2016-04-25 DIAGNOSIS — Z86010 Personal history of colonic polyps: Secondary | ICD-10-CM | POA: Diagnosis not present

## 2016-04-25 DIAGNOSIS — I252 Old myocardial infarction: Secondary | ICD-10-CM | POA: Insufficient documentation

## 2016-04-25 DIAGNOSIS — F419 Anxiety disorder, unspecified: Secondary | ICD-10-CM | POA: Diagnosis not present

## 2016-04-25 DIAGNOSIS — J449 Chronic obstructive pulmonary disease, unspecified: Secondary | ICD-10-CM | POA: Diagnosis not present

## 2016-04-25 DIAGNOSIS — Z87891 Personal history of nicotine dependence: Secondary | ICD-10-CM | POA: Diagnosis not present

## 2016-04-25 DIAGNOSIS — K5730 Diverticulosis of large intestine without perforation or abscess without bleeding: Secondary | ICD-10-CM | POA: Insufficient documentation

## 2016-04-25 DIAGNOSIS — K644 Residual hemorrhoidal skin tags: Secondary | ICD-10-CM | POA: Diagnosis not present

## 2016-04-25 DIAGNOSIS — E119 Type 2 diabetes mellitus without complications: Secondary | ICD-10-CM | POA: Diagnosis not present

## 2016-04-25 DIAGNOSIS — K640 First degree hemorrhoids: Secondary | ICD-10-CM | POA: Insufficient documentation

## 2016-04-25 DIAGNOSIS — I2510 Atherosclerotic heart disease of native coronary artery without angina pectoris: Secondary | ICD-10-CM | POA: Insufficient documentation

## 2016-04-25 DIAGNOSIS — I10 Essential (primary) hypertension: Secondary | ICD-10-CM | POA: Diagnosis not present

## 2016-04-25 DIAGNOSIS — Z7984 Long term (current) use of oral hypoglycemic drugs: Secondary | ICD-10-CM | POA: Insufficient documentation

## 2016-04-25 DIAGNOSIS — Z955 Presence of coronary angioplasty implant and graft: Secondary | ICD-10-CM | POA: Diagnosis not present

## 2016-04-25 DIAGNOSIS — K5790 Diverticulosis of intestine, part unspecified, without perforation or abscess without bleeding: Secondary | ICD-10-CM | POA: Diagnosis not present

## 2016-04-25 DIAGNOSIS — D122 Benign neoplasm of ascending colon: Secondary | ICD-10-CM | POA: Insufficient documentation

## 2016-04-25 DIAGNOSIS — D123 Benign neoplasm of transverse colon: Secondary | ICD-10-CM | POA: Diagnosis not present

## 2016-04-25 DIAGNOSIS — Z1211 Encounter for screening for malignant neoplasm of colon: Secondary | ICD-10-CM | POA: Insufficient documentation

## 2016-04-25 DIAGNOSIS — K635 Polyp of colon: Secondary | ICD-10-CM | POA: Diagnosis not present

## 2016-04-25 DIAGNOSIS — Z85038 Personal history of other malignant neoplasm of large intestine: Secondary | ICD-10-CM | POA: Insufficient documentation

## 2016-04-25 HISTORY — PX: COLONOSCOPY: SHX5424

## 2016-04-25 LAB — HM COLONOSCOPY

## 2016-04-25 LAB — GLUCOSE, CAPILLARY: GLUCOSE-CAPILLARY: 115 mg/dL — AB (ref 65–99)

## 2016-04-25 SURGERY — COLONOSCOPY
Anesthesia: General

## 2016-04-25 MED ORDER — SODIUM CHLORIDE 0.9 % IV SOLN: INTRAVENOUS | Status: DC

## 2016-04-25 MED ORDER — SODIUM CHLORIDE 0.9 % IV SOLN
INTRAVENOUS | Status: DC
  Administered 2016-04-25: 09:00:00 via INTRAVENOUS

## 2016-04-25 MED ORDER — PROPOFOL 500 MG/50ML IV EMUL
INTRAVENOUS | Status: DC | PRN
Start: 2016-04-25 — End: 2016-04-25
  Administered 2016-04-25: 50 ug/kg/min via INTRAVENOUS

## 2016-04-25 NOTE — Anesthesia Postprocedure Evaluation (Signed)
Anesthesia Post Note  Patient: Nathaniel Lane  Procedure(s) Performed: Procedure(s) (LRB): COLONOSCOPY (N/A)  Patient location during evaluation: Endoscopy Anesthesia Type: General Level of consciousness: awake and alert and oriented Pain management: pain level controlled Vital Signs Assessment: post-procedure vital signs reviewed and stable Respiratory status: spontaneous breathing, nonlabored ventilation and respiratory function stable Cardiovascular status: blood pressure returned to baseline and stable Postop Assessment: no signs of nausea or vomiting Anesthetic complications: no    Last Vitals:  Vitals:   04/25/16 1000 04/25/16 1010  BP: 112/70 131/76  Pulse:    Resp:    Temp:      Last Pain:  Vitals:   04/25/16 0940  TempSrc: Tympanic                 Desirea Mizrahi

## 2016-04-25 NOTE — Anesthesia Preprocedure Evaluation (Signed)
Anesthesia Evaluation  Patient identified by MRN, date of birth, ID band Patient awake    Reviewed: Allergy & Precautions, NPO status , Patient's Chart, lab work & pertinent test results  History of Anesthesia Complications Negative for: history of anesthetic complications  Airway Mallampati: II  TM Distance: >3 FB Neck ROM: Full    Dental  (+) Poor Dentition, Missing   Pulmonary neg sleep apnea, COPD, former smoker,  Hx of lobectomy for lung cancer   breath sounds clear to auscultation- rhonchi (-) wheezing      Cardiovascular Exercise Tolerance: Good hypertension, Pt. on medications (-) angina+ CAD, + Past MI and + CABG (2007)   Rhythm:Regular Rate:Normal - Systolic murmurs and - Diastolic murmurs    Neuro/Psych Anxiety negative neurological ROS     GI/Hepatic negative GI ROS, Neg liver ROS,   Endo/Other  diabetes, Type 2, Oral Hypoglycemic AgentsHypothyroidism   Renal/GU CRFRenal disease     Musculoskeletal negative musculoskeletal ROS (+)   Abdominal (+) - obese,   Peds  Hematology negative hematology ROS (+)   Anesthesia Other Findings Past Medical History: 2012: Cancer ([place])     Comment: Stage IIA squamous cell Lung Ca No date: Diabetes mellitus No date: Gout No date: Hyperlipidemia No date: Hypertension No date: Myocardial infarction 2013: Thyroid cancer ([place])     Comment: s/p thyroidectomy 2014   Reproductive/Obstetrics                             Anesthesia Physical Anesthesia Plan  ASA: III  Anesthesia Plan: General   Post-op Pain Management:    Induction: Intravenous  Airway Management Planned: Natural Airway  Additional Equipment:   Intra-op Plan:   Post-operative Plan:   Informed Consent: I have reviewed the patients History and Physical, chart, labs and discussed the procedure including the risks, benefits and alternatives for the proposed anesthesia  with the patient or authorized representative who has indicated his/her understanding and acceptance.   Dental advisory given  Plan Discussed with: CRNA and Anesthesiologist  Anesthesia Plan Comments:         Anesthesia Quick Evaluation

## 2016-04-25 NOTE — Op Note (Signed)
[facility] Patient Name: Nathaniel Lane Procedure Date: 04/25/2016 9:08 AM MRN: [id number] Account #: [id number] Date of Birth: [birth year] Admit Type: Outpatient Age: 70 Room: [facility] ENDO ROOM 4 Gender: Male Note Status: Finalized Procedure:            Colonoscopy Indications:          High risk colon cancer surveillance: Personal history                        of colon cancer Providers:            Manya Silvas, MD Referring MD:         Deborra Medina, MD (Referring MD) Medicines:            Propofol per Anesthesia Complications:        No immediate complications. Procedure:            Pre-Anesthesia Assessment:                       - After reviewing the risks and benefits, the patient                        was deemed in satisfactory condition to undergo the                        procedure.                       After obtaining informed consent, the colonoscope was                        passed under direct vision. Throughout the procedure,                        the patient's blood pressure, pulse, and oxygen                        saturations were monitored continuously. The                        Colonoscope was introduced through the anus and                        advanced to the the cecum, identified by appendiceal                        orifice and ileocecal valve. The colonoscopy was                        performed without difficulty. The patient tolerated the                        procedure well. The quality of the bowel preparation                        was good. Findings:      Three sessile polyps were found in the ascending colon and cecum. The       polyps were diminutive in size. These polyps were removed with a jumbo       cold forceps. Resection and retrieval were complete.      Multiple  small-mouthed diverticula were found in the sigmoid colon and       descending colon.      External and internal hemorrhoids were found  during endoscopy. The       hemorrhoids were small and Grade I (internal hemorrhoids that do not       prolapse).      The exam was otherwise without abnormality. Impression:           - Three diminutive polyps in the ascending colon and in                        the cecum, removed with a jumbo cold forceps. Resected                        and retrieved.                       - Diverticulosis in the sigmoid colon and in the                        descending colon.                       - External and internal hemorrhoids.                       - The examination was otherwise normal. Recommendation:       - Await pathology results. Manya Silvas, MD 04/25/2016 9:43:53 AM This report has been signed electronically. Number of Addenda: 0 Note Initiated On: 04/25/2016 9:08 AM Scope Withdrawal Time: 0 hours 19 minutes 2 seconds  Total Procedure Duration: 0 hours 27 minutes 32 seconds       [facility]

## 2016-04-25 NOTE — Transfer of Care (Signed)
Immediate Anesthesia Transfer of Care Note  Patient: Nathaniel Lane  Procedure(s) Performed: Procedure(s) with comments: COLONOSCOPY (N/A) - Diabetic (oral meds)  Patient Location: PACU  Anesthesia Type:General  Level of Consciousness: awake, alert  and oriented  Airway & Oxygen Therapy: Patient Spontanous Breathing and Patient connected to nasal cannula oxygen  Post-op Assessment: Report given to RN and Post -op Vital signs reviewed and stable  Post vital signs: Reviewed and stable  Last Vitals:  Vitals:   04/25/16 0809  BP: 139/82  Pulse: 73  Resp: 19  Temp: 36.2 C    Last Pain:  Vitals:   04/25/16 0809  TempSrc: Tympanic         Complications: No apparent anesthesia complications

## 2016-04-25 NOTE — H&P (Signed)
Primary Care Physician:  Crecencio Mc, MD Primary Gastroenterologist:  Dr. Vira Agar  Pre-Procedure History & Physical: HPI:  Nathaniel Lane is a 70 y.o. male is here for an colonoscopy.   Past Medical History:  Diagnosis Date  . Cancer ([place]) 2012   Stage IIA squamous cell Lung Ca  . Diabetes mellitus   . Gout   . Hyperlipidemia   . Hypertension   . Myocardial infarction   . Thyroid cancer ([place]) 2013   s/p thyroidectomy 2014    Past Surgical History:  Procedure Laterality Date  . CORONARY ARTERY BYPASS GRAFT  2007  . LUNG LOBECTOMY    . TOTAL THYROIDECTOMY N/A     Prior to Admission medications   Medication Sig Start Date End Date Taking? Authorizing Provider  aspirin 81 MG EC tablet TAKE 1 TABLET (81 MG TOTAL) BY MOUTH DAILY. SWALLOW WHOLE. 08/01/15   Crecencio Mc, MD  atorvastatin (LIPITOR) 20 MG tablet TAKE 1 TABLET (20 MG TOTAL) BY MOUTH DAILY. 12/26/15   Crecencio Mc, MD  enalapril (VASOTEC) 5 MG tablet TAKE 1 TABLET (5 MG TOTAL) BY MOUTH DAILY. 04/23/16   Crecencio Mc, MD  gabapentin (NEURONTIN) 100 MG capsule TAKE 1 CAPSULE (100 MG TOTAL) BY MOUTH AT BEDTIME. INCREASE DOSE WEEKLY BY 100 MG (1 CAPSULE) 03/12/16   Crecencio Mc, MD  glipiZIDE (GLUCOTROL) 5 MG tablet TAKE 1/2 TABLET (2.5 MG TOTAL) BY MOUTH 2 (TWO) TIMES DAILY BEFORE A MEAL. 01/16/16   Crecencio Mc, MD  levothyroxine (SYNTHROID, LEVOTHROID) 137 MCG tablet Take 137 mcg by mouth daily before breakfast.    Historical Provider, MD  tamsulosin (FLOMAX) 0.4 MG CAPS capsule TAKE ONE CAPSULE BY MOUTH EVERY DAY 03/12/16   Crecencio Mc, MD    Allergies as of 03/12/2016 - Review Complete 10/26/2015  Allergen Reaction Noted  . Penicillins Swelling 10/27/2012    Family History  Problem Relation Age of Onset  . Hypertension Other   . Heart disease Father     Social History   Social History  . Marital status: Married    Spouse name: N/A  . Number of children: N/A  . Years of education: N/A    Occupational History  . [employer] Retired   Social History Main Topics  . Smoking status: Former Smoker    Quit date: 07/15/2005  . Smokeless tobacco: Never Used  . Alcohol use No  . Drug use: No  . Sexual activity: Not on file   Other Topics Concern  . Not on file   Social History Narrative  . No narrative on file    Review of Systems: See HPI, otherwise negative ROS  Physical Exam: BP 139/82   Pulse 73   Temp 97.1 F (36.2 C) (Tympanic)   Resp 19   Ht '5\' 10"'$  (1.778 m)   Wt 83 kg (183 lb)   SpO2 99%   BMI 26.26 kg/m  General:   Alert,  pleasant and cooperative in NAD Head:  Normocephalic and atraumatic. Neck:  Supple; no masses or thyromegaly. Lungs:  Clear throughout to auscultation.    Heart:  Regular rate and rhythm. Abdomen:  Soft, nontender and nondistended. Normal bowel sounds, without guarding, and without rebound.   Neurologic:  Alert and  oriented x4;  grossly normal neurologically.  Impression/Plan: Nathaniel Lane is here for an colonoscopy to be performed for [facility] colon cancer  Risks, benefits, limitations, and alternatives regarding  colonoscopy have been reviewed  with the patient.  Questions have been answered.  All parties agreeable.   Gaylyn Cheers, MD  04/25/2016, 9:06 AM

## 2016-04-26 ENCOUNTER — Encounter: Payer: Self-pay | Admitting: Unknown Physician Specialty

## 2016-04-26 LAB — SURGICAL PATHOLOGY

## 2016-05-02 ENCOUNTER — Ambulatory Visit (INDEPENDENT_AMBULATORY_CARE_PROVIDER_SITE_OTHER): Payer: PPO | Admitting: Internal Medicine

## 2016-05-02 ENCOUNTER — Encounter: Payer: Self-pay | Admitting: Internal Medicine

## 2016-05-02 VITALS — BP 120/72 | HR 62 | Temp 97.5°F | Resp 12 | Ht 70.0 in | Wt 184.8 lb

## 2016-05-02 DIAGNOSIS — N183 Chronic kidney disease, stage 3 unspecified: Secondary | ICD-10-CM

## 2016-05-02 DIAGNOSIS — E782 Mixed hyperlipidemia: Secondary | ICD-10-CM | POA: Diagnosis not present

## 2016-05-02 DIAGNOSIS — Z85850 Personal history of malignant neoplasm of thyroid: Secondary | ICD-10-CM | POA: Diagnosis not present

## 2016-05-02 DIAGNOSIS — E1121 Type 2 diabetes mellitus with diabetic nephropathy: Secondary | ICD-10-CM

## 2016-05-02 DIAGNOSIS — I10 Essential (primary) hypertension: Secondary | ICD-10-CM | POA: Diagnosis not present

## 2016-05-02 DIAGNOSIS — E559 Vitamin D deficiency, unspecified: Secondary | ICD-10-CM | POA: Diagnosis not present

## 2016-05-02 DIAGNOSIS — D126 Benign neoplasm of colon, unspecified: Secondary | ICD-10-CM

## 2016-05-02 DIAGNOSIS — E890 Postprocedural hypothyroidism: Secondary | ICD-10-CM

## 2016-05-02 DIAGNOSIS — E7800 Pure hypercholesterolemia, unspecified: Secondary | ICD-10-CM

## 2016-05-02 DIAGNOSIS — F411 Generalized anxiety disorder: Secondary | ICD-10-CM

## 2016-05-02 LAB — COMPREHENSIVE METABOLIC PANEL
ALK PHOS: 101 U/L (ref 39–117)
ALT: 30 U/L (ref 0–53)
AST: 19 U/L (ref 0–37)
Albumin: 4.1 g/dL (ref 3.5–5.2)
BILIRUBIN TOTAL: 0.4 mg/dL (ref 0.2–1.2)
BUN: 25 mg/dL — AB (ref 6–23)
CO2: 30 mEq/L (ref 19–32)
CREATININE: 1.67 mg/dL — AB (ref 0.40–1.50)
Calcium: 9.6 mg/dL (ref 8.4–10.5)
Chloride: 105 mEq/L (ref 96–112)
GFR: 43.65 mL/min — ABNORMAL LOW (ref >60.00)
GLUCOSE: 119 mg/dL — AB (ref 70–99)
Potassium: 4.6 mEq/L (ref 3.5–5.1)
SODIUM: 140 meq/L (ref 135–145)
TOTAL PROTEIN: 7.4 g/dL (ref 6.0–8.3)

## 2016-05-02 LAB — VITAMIN D 25 HYDROXY (VIT D DEFICIENCY, FRACTURES): VITD: 24.15 ng/mL — AB (ref 30.00–100.00)

## 2016-05-02 LAB — LDL CHOLESTEROL, DIRECT: Direct LDL: 97 mg/dL

## 2016-05-02 LAB — CBC WITH DIFFERENTIAL/PLATELET
BASOS PCT: 0.3 % (ref 0.0–3.0)
Basophils Absolute: 0 10*3/uL (ref 0.0–0.1)
EOS ABS: 0.2 10*3/uL (ref 0.0–0.7)
Eosinophils Relative: 2.5 % (ref 0.0–5.0)
HEMATOCRIT: 45.2 % (ref 39.0–52.0)
Hemoglobin: 15.1 g/dL (ref 13.0–17.0)
Lymphocytes Relative: 25.7 % (ref 12.0–46.0)
Lymphs Abs: 2.1 10*3/uL (ref 0.7–4.0)
MCHC: 33.4 g/dL (ref 30.0–36.0)
MCV: 88.3 fl (ref 78.0–100.0)
MONO ABS: 0.7 10*3/uL (ref 0.1–1.0)
Monocytes Relative: 9 % (ref 3.0–12.0)
NEUTROS ABS: 5.2 10*3/uL (ref 1.4–7.7)
Neutrophils Relative %: 62.5 % (ref 43.0–77.0)
PLATELETS: 139 10*3/uL — AB (ref 150.0–400.0)
RBC: 5.12 Mil/uL (ref 4.22–5.81)
RDW: 14.6 % (ref 11.5–15.5)
WBC: 8.3 10*3/uL (ref 4.0–10.5)

## 2016-05-02 LAB — TSH: TSH: 1.94 u[IU]/mL (ref 0.35–4.50)

## 2016-05-02 LAB — HEMOGLOBIN A1C: Hgb A1c MFr Bld: 6.9 % — ABNORMAL HIGH (ref 4.6–6.5)

## 2016-05-02 NOTE — Patient Instructions (Addendum)
Talk to your wife's doctor about whether you are ready tohave a  "Do Not Resuscitate " (DNR ) order to prevent EMS from prolonging her life if her heart or breathing stops.  Resume  the glipizide at 5 mg twice daily before breakfast .  If your A1c is high today,  We might resume the evening dose of glipizide (before supper)

## 2016-05-02 NOTE — Progress Notes (Signed)
Pre-visit discussion using our clinic review tool. No additional management support is needed unless otherwise documented below in the visit note.  

## 2016-05-02 NOTE — Progress Notes (Signed)
Subjective:  [name], male    DOB: Jan 08, 1946  Age: 70 y.o. MRN: [id number]  CC: The primary encounter diagnosis was Essential hypertension. Diagnoses of Controlled type 2 diabetes mellitus with diabetic nephropathy, without long-term current use of insulin ([place]), Postsurgical hypothyroidism, CKD (chronic kidney disease) stage 3, GFR 30-59 ml/min, Vitamin D deficiency, Mixed hyperlipidemia, Pure hypercholesterolemia, Adenomatous polyp of colon, unspecified part of colon, and Anxiety state were also pertinent to this visit.  HPI Nathaniel Lane presents for follow up on chronic conditions including  type 2 DM with microalbuminuria,  Hypertension, history of thyroid CA s/p thyroidectomy,  And lung CA.   3 month follow up on diabetes.  Patient has no complaints today.  Patient is following a low glycemic index diet and taking all prescribed medications regularly , but ran out of glipizide 10 days ago .  Fasting sugars have been under less than 140 most of the time and post prandials have been under 160 except on rare occasions. Patient is exercising about 3 times per week .  Patient has had an eye exam in the last 12 months and checks feet regularly for signs of infection.  Patient does not walk barefoot outside,  And denies any numbness tingling or burning in feet. Patient is up to date on all recommended vaccinations.   Had 3  Tiny  polyps on colonoscopy,  Follow up 5 years per [place]ala Dock care of wife with dementia at home,  Has help of one caregiver,  But getting to the point that he is considering placement due to increased  needs.     Lab Results  Component Value Date   HGBA1C 6.9 (H) 05/02/2016   Lab Results  Component Value Date   MICROALBUR 2.5 (H) 10/26/2015     Outpatient Medications Prior to Visit  Medication Sig Dispense Refill  . aspirin 81 MG EC tablet TAKE 1 TABLET (81 MG TOTAL) BY MOUTH DAILY. SWALLOW WHOLE. 30 tablet 11  . atorvastatin (LIPITOR) 20  MG tablet TAKE 1 TABLET (20 MG TOTAL) BY MOUTH DAILY. 90 tablet 1  . enalapril (VASOTEC) 5 MG tablet TAKE 1 TABLET (5 MG TOTAL) BY MOUTH DAILY. 90 tablet 2  . gabapentin (NEURONTIN) 100 MG capsule TAKE 1 CAPSULE (100 MG TOTAL) BY MOUTH AT BEDTIME. INCREASE DOSE WEEKLY BY 100 MG (1 CAPSULE) 90 capsule 1  . tamsulosin (FLOMAX) 0.4 MG CAPS capsule TAKE ONE CAPSULE BY MOUTH EVERY DAY 90 capsule 1  . levothyroxine (SYNTHROID, LEVOTHROID) 137 MCG tablet Take 137 mcg by mouth daily before breakfast.    . glipiZIDE (GLUCOTROL) 5 MG tablet TAKE 1/2 TABLET (2.5 MG TOTAL) BY MOUTH 2 (TWO) TIMES DAILY BEFORE A MEAL. (Patient not taking: Reported on 05/02/2016) 30 tablet 3   No facility-administered medications prior to visit.     Review of Systems;  Patient denies headache, fevers, malaise, unintentional weight loss, skin rash, eye pain, sinus congestion and sinus pain, sore throat, dysphagia,  hemoptysis , cough, dyspnea, wheezing, chest pain, palpitations, orthopnea, edema, abdominal pain, nausea, melena, diarrhea, constipation, flank pain, dysuria, hematuria, urinary  Frequency, nocturia, numbness, tingling, seizures,  Focal weakness, Loss of consciousness,  Tremor, insomnia, depression, anxiety, and suicidal ideation.      Objective:  BP 120/72   Pulse 62   Temp 97.5 F (36.4 C) (Oral)   Resp 12   Ht '5\' 10"'$  (1.778 m)   Wt 184 lb 12 oz (83.8 kg)   SpO2  95%   BMI 26.51 kg/m   BP Readings from Last 3 Encounters:  05/02/16 120/72  04/25/16 131/76  01/19/16 (!) 146/80    Wt Readings from Last 3 Encounters:  05/02/16 184 lb 12 oz (83.8 kg)  04/25/16 183 lb (83 kg)  01/19/16 183 lb 15.6 oz (83.4 kg)    General appearance: alert, cooperative and appears stated age Ears: normal TM's and external ear canals both ears Throat: lips, mucosa, and tongue normal; teeth and gums normal Neck: no adenopathy, no carotid bruit, supple, symmetrical, trachea midline and thyroid not enlarged, symmetric, no  tenderness/mass/nodules Back: symmetric, no curvature. ROM normal. No CVA tenderness. Lungs: clear to auscultation bilaterally Heart: regular rate and rhythm, S1, S2 normal, no murmur, click, rub or gallop Abdomen: soft, non-tender; bowel sounds normal; no masses,  no organomegaly Pulses: 2+ and symmetric Skin: Skin color, texture, turgor normal. No rashes or lesions Lymph nodes: Cervical, supraclavicular, and axillary nodes normal.  Lab Results  Component Value Date   HGBA1C 6.9 (H) 05/02/2016   HGBA1C 7.1 (H) 10/26/2015   HGBA1C 6.9 (H) 04/20/2015    Lab Results  Component Value Date   CREATININE 1.67 (H) 05/02/2016   CREATININE 1.81 (H) 10/26/2015   CREATININE 1.75 (H) 04/20/2015    Lab Results  Component Value Date   WBC 8.3 05/02/2016   HGB 15.1 05/02/2016   HCT 45.2 05/02/2016   PLT 139.0 (L) 05/02/2016   GLUCOSE 119 (H) 05/02/2016   CHOL 155 10/26/2015   TRIG 267.0 (H) 10/26/2015   HDL 30.90 (L) 10/26/2015   LDLDIRECT 97.0 05/02/2016   LDLCALC 88 10/30/2013   ALT 30 05/02/2016   AST 19 05/02/2016   NA 140 05/02/2016   K 4.6 05/02/2016   CL 105 05/02/2016   CREATININE 1.67 (H) 05/02/2016   BUN 25 (H) 05/02/2016   CO2 30 05/02/2016   TSH 1.94 05/02/2016   PSA 1.20 10/26/2015   INR 1.2 09/27/2011   HGBA1C 6.9 (H) 05/02/2016   MICROALBUR 2.5 (H) 10/26/2015    No results found.  Assessment & Plan:   Problem List Items Addressed This Visit    Polyp of colon, adenomatous    By colonoscopy 2017.  Repeat in 5 years (2022)      Type 2 diabetes mellitus, controlled, with renal complications (HCC)    Improved control with  glipizide  5 mg two times daily  He is taking an ACE I , high potency statin and ASA.  Neuropathy noted ,  Foot and eye exams are up to date.    Lab Results  Component Value Date   HGBA1C 6.9 (H) 05/02/2016   Lab Results  Component Value Date   MICROALBUR 2.5 (H) 10/26/2015   Lab Results  Component Value Date   CHOL 155 10/26/2015     HDL 30.90 (L) 10/26/2015   LDLCALC 88 10/30/2013   LDLDIRECT 97.0 05/02/2016   TRIG 267.0 (H) 10/26/2015   CHOLHDL 5 10/26/2015                     Relevant Orders   Hemoglobin A1c (Completed)   Hemoglobin A1c   Postsurgical hypothyroidism    Secondary to thyroid cancer diagnosed in 2014.  Need to continue TSH suppression for 5 years, so levohthyorxicne dose was  increased today  For TSH 1.94.Repeat TSH and Free T4 in 6 weeks.   Lab Results  Component Value Date   TSH 1.94 05/02/2016  Relevant Orders   TSH (Completed)   TSH   Anxiety state    Complicated by caregiver fatigue secondary to wife's cognitive decline secondary to Alzheimers Disease.  A total of 25 minutes of face to face time was spent with patient more than half of which was spent in counselling about the above mentioned conditions  and coordination of care       Vitamin D deficiency   Relevant Orders   VITAMIN D 25 Hydroxy (Vit-D Deficiency, Fractures) (Completed)   Hypertension - Primary   Relevant Orders   Comprehensive metabolic panel (Completed)   Comprehensive metabolic panel   CKD (chronic kidney disease) stage 3, GFR 30-59 ml/min   Relevant Orders   CBC with Differential/Platelet (Completed)   Hyperlipidemia   Relevant Orders   LDL cholesterol, direct   Lipid panel   LDL cholesterol, direct (Completed)      I am having Mr. Hinchman maintain his aspirin, atorvastatin, glipiZIDE, tamsulosin, gabapentin, and enalapril.  No orders of the defined types were placed in this encounter.   There are no discontinued medications.  Follow-up: Return in about 6 months (around 10/30/2016) for fasting labs prior to visit .   Crecencio Mc, MD

## 2016-05-04 ENCOUNTER — Other Ambulatory Visit: Payer: Self-pay | Admitting: Internal Medicine

## 2016-05-04 DIAGNOSIS — C73 Malignant neoplasm of thyroid gland: Secondary | ICD-10-CM

## 2016-05-04 DIAGNOSIS — E890 Postprocedural hypothyroidism: Secondary | ICD-10-CM

## 2016-05-04 DIAGNOSIS — D696 Thrombocytopenia, unspecified: Secondary | ICD-10-CM

## 2016-05-04 DIAGNOSIS — I10 Essential (primary) hypertension: Secondary | ICD-10-CM

## 2016-05-04 DIAGNOSIS — E1121 Type 2 diabetes mellitus with diabetic nephropathy: Secondary | ICD-10-CM

## 2016-05-04 DIAGNOSIS — E559 Vitamin D deficiency, unspecified: Secondary | ICD-10-CM

## 2016-05-04 MED ORDER — LEVOTHYROXINE SODIUM 150 MCG PO TABS: 150.0000 ug | ORAL_TABLET | Freq: Every day | ORAL | 0 refills | Status: DC

## 2016-05-05 NOTE — Assessment & Plan Note (Signed)
Complicated by caregiver fatigue secondary to wife's cognitive decline secondary to Alzheimers Disease.  A total of 25 minutes of face to face time was spent with patient more than half of which was spent in counselling about the above mentioned conditions  and coordination of care

## 2016-05-05 NOTE — Assessment & Plan Note (Signed)
Secondary to thyroid cancer diagnosed in 2014.  Need to continue TSH suppression for 5 years, so levohthyorxicne dose was  increased today  For TSH 1.94.Repeat TSH and Free T4 in 6 weeks.   Lab Results  Component Value Date   TSH 1.94 05/02/2016

## 2016-05-05 NOTE — Assessment & Plan Note (Addendum)
Improved control with  glipizide  5 mg two times daily  He is taking an ACE I , high potency statin and ASA.  Neuropathy noted ,  Foot and eye exams are up to date.    Lab Results  Component Value Date   HGBA1C 6.9 (H) 05/02/2016   Lab Results  Component Value Date   MICROALBUR 2.5 (H) 10/26/2015   Lab Results  Component Value Date   CHOL 155 10/26/2015   HDL 30.90 (L) 10/26/2015   LDLCALC 88 10/30/2013   LDLDIRECT 97.0 05/02/2016   TRIG 267.0 (H) 10/26/2015   CHOLHDL 5 10/26/2015

## 2016-05-05 NOTE — Assessment & Plan Note (Signed)
By colonoscopy 2017.  Repeat in 5 years (2022)

## 2016-05-11 ENCOUNTER — Telehealth: Payer: Self-pay | Admitting: Internal Medicine

## 2016-05-11 NOTE — Telephone Encounter (Signed)
Letter printed.

## 2016-05-11 NOTE — Telephone Encounter (Signed)
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

## 2016-05-11 NOTE — Telephone Encounter (Signed)
Pt called to follow up on the letter to be able to drive. Pt stated it can be mailed to him. Thank you!

## 2016-05-14 NOTE — Telephone Encounter (Signed)
Patient notified letter to be mailed today address correct in chart.

## 2016-05-23 ENCOUNTER — Other Ambulatory Visit: Payer: Self-pay | Admitting: Internal Medicine

## 2016-05-29 ENCOUNTER — Other Ambulatory Visit: Payer: Self-pay | Admitting: Internal Medicine

## 2016-06-04 ENCOUNTER — Other Ambulatory Visit: Payer: Self-pay | Admitting: Internal Medicine

## 2016-06-07 ENCOUNTER — Other Ambulatory Visit: Payer: Self-pay | Admitting: *Deleted

## 2016-06-07 MED ORDER — ASPIRIN 81 MG PO TBEC: DELAYED_RELEASE_TABLET | ORAL | 3 refills | Status: DC

## 2016-07-24 ENCOUNTER — Other Ambulatory Visit: Payer: Self-pay | Admitting: Internal Medicine

## 2016-08-07 ENCOUNTER — Other Ambulatory Visit: Payer: Self-pay | Admitting: Internal Medicine

## 2016-08-09 ENCOUNTER — Other Ambulatory Visit (INDEPENDENT_AMBULATORY_CARE_PROVIDER_SITE_OTHER): Payer: PPO

## 2016-08-09 DIAGNOSIS — D696 Thrombocytopenia, unspecified: Secondary | ICD-10-CM | POA: Diagnosis not present

## 2016-08-09 DIAGNOSIS — E7800 Pure hypercholesterolemia, unspecified: Secondary | ICD-10-CM | POA: Diagnosis not present

## 2016-08-09 DIAGNOSIS — E1121 Type 2 diabetes mellitus with diabetic nephropathy: Secondary | ICD-10-CM

## 2016-08-09 DIAGNOSIS — C73 Malignant neoplasm of thyroid gland: Secondary | ICD-10-CM

## 2016-08-09 DIAGNOSIS — I10 Essential (primary) hypertension: Secondary | ICD-10-CM | POA: Diagnosis not present

## 2016-08-09 DIAGNOSIS — E890 Postprocedural hypothyroidism: Secondary | ICD-10-CM

## 2016-08-09 LAB — TSH: TSH: 0.42 u[IU]/mL (ref 0.35–4.50)

## 2016-08-09 LAB — CBC WITH DIFFERENTIAL/PLATELET
BASOS ABS: 0 10*3/uL (ref 0.0–0.1)
Basophils Relative: 0.5 % (ref 0.0–3.0)
EOS PCT: 2.7 % (ref 0.0–5.0)
Eosinophils Absolute: 0.2 10*3/uL (ref 0.0–0.7)
HEMATOCRIT: 44.2 % (ref 39.0–52.0)
Hemoglobin: 14.5 g/dL (ref 13.0–17.0)
LYMPHS ABS: 2.2 10*3/uL (ref 0.7–4.0)
LYMPHS PCT: 25.1 % (ref 12.0–46.0)
MCHC: 32.8 g/dL (ref 30.0–36.0)
MCV: 88.7 fl (ref 78.0–100.0)
MONOS PCT: 8.6 % (ref 3.0–12.0)
Monocytes Absolute: 0.7 10*3/uL (ref 0.1–1.0)
NEUTROS PCT: 63.1 % (ref 43.0–77.0)
Neutro Abs: 5.4 10*3/uL (ref 1.4–7.7)
Platelets: 152 10*3/uL (ref 150.0–400.0)
RBC: 4.98 Mil/uL (ref 4.22–5.81)
RDW: 14.4 % (ref 11.5–15.5)
WBC: 8.6 10*3/uL (ref 4.0–10.5)

## 2016-08-09 LAB — COMPREHENSIVE METABOLIC PANEL
ALBUMIN: 3.9 g/dL (ref 3.5–5.2)
ALK PHOS: 105 U/L (ref 39–117)
ALT: 28 U/L (ref 0–53)
AST: 21 U/L (ref 0–37)
BUN: 24 mg/dL — AB (ref 6–23)
CALCIUM: 9.3 mg/dL (ref 8.4–10.5)
CHLORIDE: 104 meq/L (ref 96–112)
CO2: 26 mEq/L (ref 19–32)
CREATININE: 1.5 mg/dL (ref 0.40–1.50)
GFR: 49.37 mL/min — ABNORMAL LOW (ref >60.00)
Glucose, Bld: 143 mg/dL — ABNORMAL HIGH (ref 70–99)
POTASSIUM: 4.6 meq/L (ref 3.5–5.1)
Sodium: 137 mEq/L (ref 135–145)
TOTAL PROTEIN: 7.4 g/dL (ref 6.0–8.3)
Total Bilirubin: 0.4 mg/dL (ref 0.2–1.2)

## 2016-08-09 LAB — LIPID PANEL
CHOLESTEROL: 155 mg/dL (ref 0–200)
HDL: 29.5 mg/dL — AB (ref >39.00)
NonHDL: 125.58
Total CHOL/HDL Ratio: 5
Triglycerides: 288 mg/dL — ABNORMAL HIGH (ref 0.0–149.0)
VLDL: 57.6 mg/dL — AB (ref 0.0–40.0)

## 2016-08-09 LAB — LDL CHOLESTEROL, DIRECT: LDL DIRECT: 83 mg/dL

## 2016-08-09 LAB — HEMOGLOBIN A1C: HEMOGLOBIN A1C: 7.1 % — AB (ref 4.6–6.5)

## 2016-08-09 NOTE — Addendum Note (Signed)
Addended by: Arby Barrette on: 08/09/2016 08:00 AM   Modules accepted: Orders

## 2016-08-10 LAB — T4 AND TSH
T4 TOTAL: 9.7 ug/dL (ref 4.5–12.0)
TSH: 0.446 u[IU]/mL — AB (ref 0.450–4.500)

## 2016-08-15 ENCOUNTER — Ambulatory Visit (INDEPENDENT_AMBULATORY_CARE_PROVIDER_SITE_OTHER): Payer: PPO | Admitting: Internal Medicine

## 2016-08-15 ENCOUNTER — Encounter: Payer: Self-pay | Admitting: Internal Medicine

## 2016-08-15 VITALS — BP 132/84 | HR 76 | Resp 16 | Wt 179.0 lb

## 2016-08-15 DIAGNOSIS — N183 Chronic kidney disease, stage 3 unspecified: Secondary | ICD-10-CM

## 2016-08-15 DIAGNOSIS — R0602 Shortness of breath: Secondary | ICD-10-CM | POA: Diagnosis not present

## 2016-08-15 DIAGNOSIS — I5022 Chronic systolic (congestive) heart failure: Secondary | ICD-10-CM | POA: Diagnosis not present

## 2016-08-15 DIAGNOSIS — E1121 Type 2 diabetes mellitus with diabetic nephropathy: Secondary | ICD-10-CM | POA: Diagnosis not present

## 2016-08-15 DIAGNOSIS — Z87891 Personal history of nicotine dependence: Secondary | ICD-10-CM | POA: Diagnosis not present

## 2016-08-15 DIAGNOSIS — E890 Postprocedural hypothyroidism: Secondary | ICD-10-CM

## 2016-08-15 DIAGNOSIS — N182 Chronic kidney disease, stage 2 (mild): Secondary | ICD-10-CM | POA: Diagnosis not present

## 2016-08-15 DIAGNOSIS — E784 Other hyperlipidemia: Secondary | ICD-10-CM | POA: Diagnosis not present

## 2016-08-15 DIAGNOSIS — I25810 Atherosclerosis of coronary artery bypass graft(s) without angina pectoris: Secondary | ICD-10-CM

## 2016-08-15 DIAGNOSIS — E119 Type 2 diabetes mellitus without complications: Secondary | ICD-10-CM | POA: Diagnosis not present

## 2016-08-15 DIAGNOSIS — C73 Malignant neoplasm of thyroid gland: Secondary | ICD-10-CM | POA: Diagnosis not present

## 2016-08-15 DIAGNOSIS — I509 Heart failure, unspecified: Secondary | ICD-10-CM | POA: Diagnosis not present

## 2016-08-15 DIAGNOSIS — I2510 Atherosclerotic heart disease of native coronary artery without angina pectoris: Secondary | ICD-10-CM | POA: Diagnosis not present

## 2016-08-15 DIAGNOSIS — E7800 Pure hypercholesterolemia, unspecified: Secondary | ICD-10-CM

## 2016-08-15 DIAGNOSIS — K219 Gastro-esophageal reflux disease without esophagitis: Secondary | ICD-10-CM | POA: Diagnosis not present

## 2016-08-15 DIAGNOSIS — I10 Essential (primary) hypertension: Secondary | ICD-10-CM

## 2016-08-15 DIAGNOSIS — C3490 Malignant neoplasm of unspecified part of unspecified bronchus or lung: Secondary | ICD-10-CM | POA: Diagnosis not present

## 2016-08-15 NOTE — Progress Notes (Signed)
Pre visit review using our clinic review tool, if applicable. No additional management support is needed unless otherwise documented below in the visit note. 

## 2016-08-15 NOTE — Patient Instructions (Addendum)
You are doing great!  [facility] has an annual presentation for family members who have a patient with dementia. Call them.    I'll see you in 6 months

## 2016-08-15 NOTE — Progress Notes (Signed)
Subjective:  [name], male    DOB: Mar 27, 1946  Age: 71 y.o. MRN: [id number]  CC: The primary encounter diagnosis was Controlled type 2 diabetes mellitus with diabetic nephropathy, without long-term current use of insulin ([place]). Diagnoses of CKD (chronic kidney disease) stage 3, GFR 30-59 ml/min, Postsurgical hypothyroidism, Coronary artery disease involving autologous vein coronary bypass graft without angina pectoris, Pure hypercholesterolemia, Essential hypertension, and Thyroid cancer ([place]) were also pertinent to this visit.  HPI [name] presents for follow up on type 2 dm, hyperlipidemia and hypertension   Patient has no complaints today.  Patient is following a low glycemic index diet and taking all prescribed medications regularly without side effects.  Fasting sugars have been under less than 140 most of the time and post prandials have been under 160 except on rare occasions. Patient is not exercising  Or intentionally trying to lose weight .  Patient has had an eye exam in the last 12 months and checks feet regularly for signs of infection.  Patient does not walk barefoot outside,  And denies any numbness tingling or burning in feet. Patient is up to date on all recommended vaccinations  Unintentional weight loss of 5 lbs noted.  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  He is doing the cooking mostly  No complaints today except anxiety about wife's decline. He has assistance form a fellow church member who watches her during the day when he works .      Marland Kitchen   Lab Results  Component Value Date   CHOL 155 08/09/2016   HDL 29.50 (L) 08/09/2016   LDLCALC 88 10/30/2013   LDLDIRECT 83.0 08/09/2016   TRIG 288.0 (H) 08/09/2016   CHOLHDL 5 08/09/2016     Outpatient Medications Prior to Visit  Medication Sig Dispense Refill  . aspirin 81 MG EC tablet TAKE 1 TABLET (81 MG TOTAL) BY MOUTH DAILY. SWALLOW WHOLE.  90 tablet 3  . atorvastatin (LIPITOR) 20 MG tablet TAKE 1 TABLET (20 MG TOTAL) BY MOUTH DAILY. 90 tablet 1  . enalapril (VASOTEC) 5 MG tablet TAKE 1 TABLET (5 MG TOTAL) BY MOUTH DAILY. 90 tablet 2  . gabapentin (NEURONTIN) 100 MG capsule TAKE 1 CAPSULE (100 MG TOTAL) BY MOUTH AT BEDTIME. INCREASE DOSE WEEKLY BY 100 MG (1 CAPSULE) 90 capsule 1  . glipiZIDE (GLUCOTROL) 5 MG tablet TAKE 1/2 TABLET (2.5 MG TOTAL) BY MOUTH 2 (TWO) TIMES DAILY BEFORE A MEAL. 30 tablet 3  . levothyroxine (SYNTHROID, LEVOTHROID) 150 MCG tablet TAKE ONE TABLET BY MOUTH ONCE DAILY BEFORE BREAKFAST 30 tablet 0  . tamsulosin (FLOMAX) 0.4 MG CAPS capsule TAKE ONE CAPSULE BY MOUTH EVERY DAY 90 capsule 1   No facility-administered medications prior to visit.     Review of Systems;  Patient denies headache, fevers, malaise, unintentional weight loss, skin rash, eye pain, sinus congestion and sinus pain, sore throat, dysphagia,  hemoptysis , cough, dyspnea, wheezing, chest pain, palpitations, orthopnea, edema, abdominal pain, nausea, melena, diarrhea, constipation, flank pain, dysuria, hematuria, urinary  Frequency, nocturia, numbness, tingling, seizures,  Focal weakness, Loss of consciousness,  Tremor, insomnia, depression, anxiety, and suicidal ideation.      Objective:  BP 132/84   Pulse 76   Resp 16   Wt 179 lb (81.2 kg)   SpO2 96%   BMI 25.68 kg/m   BP Readings from Last 3 Encounters:  08/15/16 132/84  05/02/16 120/72  04/25/16  131/76    Wt Readings from Last 3 Encounters:  08/15/16 179 lb (81.2 kg)  05/02/16 184 lb 12 oz (83.8 kg)  04/25/16 183 lb (83 kg)    General appearance: alert, cooperative and appears stated age Ears: normal TM's and external ear canals both ears Throat: lips, mucosa, and tongue normal; teeth and gums normal Neck: no adenopathy, no carotid bruit, supple, symmetrical, trachea midline and thyroid not enlarged, symmetric, no tenderness/mass/nodules Back: symmetric, no curvature.  ROM normal. No CVA tenderness. Lungs: clear to auscultation bilaterally Heart: regular rate and rhythm, S1, S2 normal, no murmur, click, rub or gallop Abdomen: soft, non-tender; bowel sounds normal; no masses,  no organomegaly Pulses: 2+ and symmetric Skin: Skin color, texture, turgor normal. No rashes or lesions Lymph nodes: Cervical, supraclavicular, and axillary nodes normal.  Lab Results  Component Value Date   HGBA1C 7.1 (H) 08/09/2016   HGBA1C 6.9 (H) 05/02/2016   HGBA1C 7.1 (H) 10/26/2015    Lab Results  Component Value Date   CREATININE 1.50 08/09/2016   CREATININE 1.67 (H) 05/02/2016   CREATININE 1.81 (H) 10/26/2015    Lab Results  Component Value Date   WBC 8.6 08/09/2016   HGB 14.5 08/09/2016   HCT 44.2 08/09/2016   PLT 152.0 08/09/2016   GLUCOSE 143 (H) 08/09/2016   CHOL 155 08/09/2016   TRIG 288.0 (H) 08/09/2016   HDL 29.50 (L) 08/09/2016   LDLDIRECT 83.0 08/09/2016   LDLCALC 88 10/30/2013   ALT 28 08/09/2016   AST 21 08/09/2016   NA 137 08/09/2016   K 4.6 08/09/2016   CL 104 08/09/2016   CREATININE 1.50 08/09/2016   BUN 24 (H) 08/09/2016   CO2 26 08/09/2016   TSH 0.42 08/09/2016   TSH 0.446 (L) 08/09/2016   PSA 1.20 10/26/2015   INR 1.2 09/27/2011   HGBA1C 7.1 (H) 08/09/2016   MICROALBUR 2.5 (H) 10/26/2015    No results found.  Assessment & Plan:   Problem List Items Addressed This Visit    CAD (coronary artery disease), autologous vein bypass graft   CKD (chronic kidney disease) stage 3, GFR 30-59 ml/min    Renal function has been stable on ACE INhibitor,  He is avoiding NSAIDs and nephrotoxic agents.   Lab Results  Component Value Date   CREATININE 1.50 08/09/2016  . Lab Results  Component Value Date   MICROALBUR 2.5 (H) 10/26/2015             Relevant Orders   Comprehensive metabolic panel   Controlled type 2 diabetes mellitus with diabetic nephropathy (HCC) - Primary    Light loss of control with  glipizide  5 mg two times  daily, but no need for medication change.   He is taking an ACE I , high potency statin and ASA.  Mild Neuropathy noted ,  Foot and eye exams are up to date.    Lab Results  Component Value Date   HGBA1C 7.1 (H) 08/09/2016   Lab Results  Component Value Date   MICROALBUR 2.5 (H) 10/26/2015   Lab Results  Component Value Date   CHOL 155 08/09/2016   HDL 29.50 (L) 08/09/2016   LDLCALC 88 10/30/2013   LDLDIRECT 83.0 08/09/2016   TRIG 288.0 (H) 08/09/2016   CHOLHDL 5 08/09/2016                     Relevant Orders   Hemoglobin A1c   Microalbumin / creatinine urine ratio   Hyperlipidemia  LDL and triglycerides are at goal on current medications. He has no side effects and liver enzymes are normal. No changes today   Lab Results  Component Value Date   CHOL 155 08/09/2016   HDL 29.50 (L) 08/09/2016   LDLCALC 88 10/30/2013   LDLDIRECT 83.0 08/09/2016   TRIG 288.0 (H) 08/09/2016   CHOLHDL 5 08/09/2016               Relevant Orders   LDL cholesterol, direct   Lipid panel   Hypertension    Well controlled on current regimen. Renal function stable, no changes today.  Lab Results  Component Value Date   CREATININE 1.50 08/09/2016   Lab Results  Component Value Date   NA 137 08/09/2016   K 4.6 08/09/2016   CL 104 08/09/2016   CO2 26 08/09/2016         Postsurgical hypothyroidism   Relevant Orders   TSH   Thyroid cancer ([place])    Secondary to thyroid cancer diagnosed in 2014.  Need to continue TSH suppression for 5 years, so levothyroxine dose is therapeutic currently    Lab Results  Component Value Date   TSH 0.42 08/09/2016   TSH 0.446 (L) 08/09/2016           A total of 25 minutes of face to face time was spent with patient more than half of which was spent in counselling about the above mentioned conditions  and coordination of care  I am having Mr. Cajuste maintain his tamsulosin, enalapril, glipiZIDE, atorvastatin, aspirin,  gabapentin, and levothyroxine.  No orders of the defined types were placed in this encounter.   There are no discontinued medications.  Follow-up: Return in about 6 months (around 02/12/2017) for folow up and wellness , fasting labs prior .   Crecencio Mc, MD

## 2016-08-16 NOTE — Assessment & Plan Note (Signed)
LDL and triglycerides are at goal on current medications. He has no side effects and liver enzymes are normal. No changes today   Lab Results  Component Value Date   CHOL 155 08/09/2016   HDL 29.50 (L) 08/09/2016   LDLCALC 88 10/30/2013   LDLDIRECT 83.0 08/09/2016   TRIG 288.0 (H) 08/09/2016   CHOLHDL 5 08/09/2016

## 2016-08-16 NOTE — Assessment & Plan Note (Signed)
Secondary to thyroid cancer diagnosed in 2014.  Need to continue TSH suppression for 5 years, so levothyroxine dose is therapeutic currently    Lab Results  Component Value Date   TSH 0.42 08/09/2016   TSH 0.446 (L) 08/09/2016

## 2016-08-16 NOTE — Assessment & Plan Note (Signed)
Renal function has been stable on ACE INhibitor,  He is avoiding NSAIDs and nephrotoxic agents.   Lab Results  Component Value Date   CREATININE 1.50 08/09/2016  . Lab Results  Component Value Date   MICROALBUR 2.5 (H) 10/26/2015

## 2016-08-16 NOTE — Assessment & Plan Note (Signed)
Light loss of control with  glipizide  5 mg two times daily, but no need for medication change.   He is taking an ACE I , high potency statin and ASA.  Mild Neuropathy noted ,  Foot and eye exams are up to date.    Lab Results  Component Value Date   HGBA1C 7.1 (H) 08/09/2016   Lab Results  Component Value Date   MICROALBUR 2.5 (H) 10/26/2015   Lab Results  Component Value Date   CHOL 155 08/09/2016   HDL 29.50 (L) 08/09/2016   LDLCALC 88 10/30/2013   LDLDIRECT 83.0 08/09/2016   TRIG 288.0 (H) 08/09/2016   CHOLHDL 5 08/09/2016

## 2016-08-16 NOTE — Assessment & Plan Note (Signed)
Well controlled on current regimen. Renal function stable, no changes today.  Lab Results  Component Value Date   CREATININE 1.50 08/09/2016   Lab Results  Component Value Date   NA 137 08/09/2016   K 4.6 08/09/2016   CL 104 08/09/2016   CO2 26 08/09/2016

## 2016-09-24 ENCOUNTER — Other Ambulatory Visit: Payer: Self-pay | Admitting: Internal Medicine

## 2016-09-25 ENCOUNTER — Other Ambulatory Visit: Payer: Self-pay | Admitting: Internal Medicine

## 2016-09-27 ENCOUNTER — Other Ambulatory Visit: Payer: Self-pay | Admitting: Internal Medicine

## 2016-10-01 ENCOUNTER — Other Ambulatory Visit: Payer: Self-pay | Admitting: Internal Medicine

## 2016-10-04 ENCOUNTER — Other Ambulatory Visit: Payer: Self-pay | Admitting: Internal Medicine

## 2016-10-04 ENCOUNTER — Other Ambulatory Visit: Payer: Self-pay | Admitting: Family

## 2016-10-09 ENCOUNTER — Other Ambulatory Visit: Payer: Self-pay | Admitting: Internal Medicine

## 2016-10-09 NOTE — Telephone Encounter (Signed)
Pt was notified that rx was sent in.

## 2016-10-09 NOTE — Telephone Encounter (Signed)
Patient has requested a medication refill for glipizide  [facility]  Pt contact [phone]

## 2016-10-15 ENCOUNTER — Other Ambulatory Visit: Payer: Self-pay

## 2016-10-15 MED ORDER — ATORVASTATIN CALCIUM 20 MG PO TABS: ORAL_TABLET | ORAL | 1 refills | Status: DC

## 2016-10-15 NOTE — Telephone Encounter (Signed)
Medication has been refilled.

## 2016-10-24 ENCOUNTER — Other Ambulatory Visit: Payer: PPO

## 2016-10-24 DIAGNOSIS — E890 Postprocedural hypothyroidism: Secondary | ICD-10-CM | POA: Diagnosis not present

## 2016-10-24 DIAGNOSIS — Z85850 Personal history of malignant neoplasm of thyroid: Secondary | ICD-10-CM | POA: Diagnosis not present

## 2016-10-31 ENCOUNTER — Encounter: Payer: Self-pay | Admitting: Internal Medicine

## 2016-10-31 ENCOUNTER — Other Ambulatory Visit: Payer: Self-pay

## 2016-10-31 ENCOUNTER — Ambulatory Visit (INDEPENDENT_AMBULATORY_CARE_PROVIDER_SITE_OTHER): Payer: PPO | Admitting: Internal Medicine

## 2016-10-31 VITALS — BP 134/72 | HR 67 | Temp 97.6°F | Resp 16 | Ht 70.0 in | Wt 179.0 lb

## 2016-10-31 DIAGNOSIS — I10 Essential (primary) hypertension: Secondary | ICD-10-CM

## 2016-10-31 DIAGNOSIS — J449 Chronic obstructive pulmonary disease, unspecified: Secondary | ICD-10-CM | POA: Diagnosis not present

## 2016-10-31 DIAGNOSIS — Z85118 Personal history of other malignant neoplasm of bronchus and lung: Secondary | ICD-10-CM | POA: Diagnosis not present

## 2016-10-31 DIAGNOSIS — N183 Chronic kidney disease, stage 3 unspecified: Secondary | ICD-10-CM

## 2016-10-31 DIAGNOSIS — E1121 Type 2 diabetes mellitus with diabetic nephropathy: Secondary | ICD-10-CM

## 2016-10-31 DIAGNOSIS — Z125 Encounter for screening for malignant neoplasm of prostate: Secondary | ICD-10-CM

## 2016-10-31 DIAGNOSIS — I25810 Atherosclerosis of coronary artery bypass graft(s) without angina pectoris: Secondary | ICD-10-CM | POA: Diagnosis not present

## 2016-10-31 DIAGNOSIS — Z85850 Personal history of malignant neoplasm of thyroid: Secondary | ICD-10-CM

## 2016-10-31 DIAGNOSIS — E890 Postprocedural hypothyroidism: Secondary | ICD-10-CM | POA: Diagnosis not present

## 2016-10-31 NOTE — Progress Notes (Signed)
Subjective:  [name], male    DOB: Jan 26, 1946  Age: 71 y.o. MRN: [id number]  CC: The primary encounter diagnosis was Prostate cancer screening. Diagnoses of Essential hypertension, Coronary artery disease involving autologous vein coronary bypass graft without angina pectoris, History of thyroid cancer, CKD (chronic kidney disease) stage 3, GFR 30-59 ml/min, Controlled type 2 diabetes mellitus with diabetic nephropathy, without long-term current use of insulin ([place]), History of lung cancer, Prostate cancer screening encounter, options and risks discussed, and COPD, mild ([place]) were also pertinent to this visit.  HPI Nathaniel Lane presents for 3 month follow up on diabetes.  Patient has no complaints today.  Patient is following a low glycemic index diet and taking all prescribed medications regularly without side effects.  Fasting sugars have been under less than 140 most of the time and post prandials have been under 160 except on rare occasions. Patient is not exercising or intentionally trying to lose weight .  Patient has not  had an eye exam in the last 12 months and checks feet regularly for signs of infection.  Patient does not walk barefoot outside,  And denies an numbness tingling or burning in feet. Patient is up to date on all recommended vaccinations  No falls,  No headaches,  Dizzy spells.   Had a 2 day drive to [place] , but stops every other rest area to stretch legs and walk.   Has been more constipated lately.    Lab Results  Component Value Date   PSA 1.20 10/26/2015   Lab Results  Component Value Date   HGBA1C 7.1 (H) 08/09/2016   Lab Results  Component Value Date   MICROALBUR 2.5 (H) 10/26/2015      Outpatient Medications Prior to Visit  Medication Sig Dispense Refill  . aspirin 81 MG EC tablet TAKE 1 TABLET (81 MG TOTAL) BY MOUTH DAILY. SWALLOW WHOLE. 90 tablet 3  . atorvastatin (LIPITOR) 20 MG tablet TAKE 1 TABLET (20 MG TOTAL) BY MOUTH DAILY.  90 tablet 1  . enalapril (VASOTEC) 5 MG tablet TAKE 1 TABLET (5 MG TOTAL) BY MOUTH DAILY. 90 tablet 2  . gabapentin (NEURONTIN) 100 MG capsule TAKE 1 CAPSULE (100 MG TOTAL) BY MOUTH AT BEDTIME. INCREASE DOSE WEEKLY BY 100 MG (1 CAPSULE) 90 capsule 0  . glipiZIDE (GLUCOTROL) 5 MG tablet Take 1 tablet (5 mg total) by mouth 2 (two) times daily before a meal. TAKE 1/2 TABLET (2.5 MG TOTAL) BY MOUTH 2 (TWO) TIMES DAILY BEFORE A MEAL.  Must keep appt for further refills 30 tablet 0  . glipiZIDE (GLUCOTROL) 5 MG tablet TAKE 1/2 TABLET (2.5 MG TOTAL) BY MOUTH 2 (TWO) TIMES DAILY BEFORE A MEAL. 30 tablet 0  . levothyroxine (SYNTHROID, LEVOTHROID) 150 MCG tablet TAKE ONE TABLET BY MOUTH ONCE DAILY BEFORE BREAKFAST 30 tablet 0  . tamsulosin (FLOMAX) 0.4 MG CAPS capsule TAKE ONE CAPSULE BY MOUTH EVERY DAY 90 capsule 1   No facility-administered medications prior to visit.     Review of Systems;  Patient denies headache, fevers, malaise, unintentional weight loss, skin rash, eye pain, sinus congestion and sinus pain, sore throat, dysphagia,  hemoptysis , cough, dyspnea, wheezing, chest pain, palpitations, orthopnea, edema, abdominal pain, nausea, melena, diarrhea, constipation, flank pain, dysuria, hematuria, urinary  Frequency, nocturia, numbness, tingling, seizures,  Focal weakness, Loss of consciousness,  Tremor, insomnia, depression, anxiety, and suicidal ideation.      Objective:  BP 134/72 (BP Location: Left Arm, Patient  Position: Sitting, Cuff Size: Normal)   Pulse 67   Temp 97.6 F (36.4 C) (Oral)   Resp 16   Ht '5\' 10"'$  (1.778 m)   Wt 179 lb (81.2 kg)   SpO2 97%   BMI 25.68 kg/m   BP Readings from Last 3 Encounters:  10/31/16 134/72  08/15/16 132/84  05/02/16 120/72    Wt Readings from Last 3 Encounters:  10/31/16 179 lb (81.2 kg)  08/15/16 179 lb (81.2 kg)  05/02/16 184 lb 12 oz (83.8 kg)    General appearance: alert, cooperative and appears stated age Ears: normal TM's and  external ear canals both ears Throat: lips, mucosa, and tongue normal; teeth and gums normal Neck: no adenopathy, no carotid bruit, supple, symmetrical, trachea midline and thyroid not enlarged, symmetric, no tenderness/mass/nodules Back: symmetric, no curvature. ROM normal. No CVA tenderness. Lungs: clear to auscultation bilaterally Heart: regular rate and rhythm, S1, S2 normal, no murmur, click, rub or gallop Abdomen: soft, non-tender; bowel sounds normal; no masses,  no organomegaly Pulses: 2+ and symmetric Skin: Skin color, texture, turgor normal. No rashes or lesions Lymph nodes: Cervical, supraclavicular, and axillary nodes normal. Neuro: CNs 2-12 intact. DTRs 2+/4 in biceps, brachioradialis, patellars and achilles. Muscle strength 5/5 in upper and lower exremities. Fine resting tremor bilaterally both hands cerebellar function normal. Romberg negative.  No pronator drift.   Gait normal.   Lab Results  Component Value Date   HGBA1C 7.1 (H) 08/09/2016   HGBA1C 6.9 (H) 05/02/2016   HGBA1C 7.1 (H) 10/26/2015    Lab Results  Component Value Date   CREATININE 1.50 08/09/2016   CREATININE 1.67 (H) 05/02/2016   CREATININE 1.81 (H) 10/26/2015    Lab Results  Component Value Date   WBC 8.6 08/09/2016   HGB 14.5 08/09/2016   HCT 44.2 08/09/2016   PLT 152.0 08/09/2016   GLUCOSE 143 (H) 08/09/2016   CHOL 155 08/09/2016   TRIG 288.0 (H) 08/09/2016   HDL 29.50 (L) 08/09/2016   LDLDIRECT 83.0 08/09/2016   LDLCALC 88 10/30/2013   ALT 28 08/09/2016   AST 21 08/09/2016   NA 137 08/09/2016   K 4.6 08/09/2016   CL 104 08/09/2016   CREATININE 1.50 08/09/2016   BUN 24 (H) 08/09/2016   CO2 26 08/09/2016   TSH 0.42 08/09/2016   TSH 0.446 (L) 08/09/2016   PSA 1.20 10/26/2015   INR 1.2 09/27/2011   HGBA1C 7.1 (H) 08/09/2016   MICROALBUR 2.5 (H) 10/26/2015    No results found.  Assessment & Plan:   Problem List Items Addressed This Visit    Hypertension    Well controlled on  current regimen. Renal function stable, no changes today.      History of thyroid cancer    Secondary to thyroid cancer diagnosed in 2014.  Need to continue TSH suppression for 5 years, so levothyroxine dose is therapeutic currently    Lab Results  Component Value Date   TSH 0.42 08/09/2016   TSH 0.446 (L) 08/09/2016         History of lung cancer    He finished adjuvant chemo in July 2013 and has had no recurrence of CA  by surveillance scans,  Last one July 2017.  Managed by Alyssa Grove       COPD, mild ([place])    Secondary to history of tobacco abuse, complicated by lung cancer and CAD. He remains asymptomatic at rest but has some mild exertional dyspnea which was deemed noncardiac in  July 2013 by [facility].  Advised to start a walking program with goal of 30 minutes daily       Controlled type 2 diabetes mellitus with diabetic nephropathy (HCC)    Light loss of control with  glipizide  5 mg two times daily, but no need for medication change.   He is taking an ACE I , high potency statin and ASA.  Mild Neuropathy noted ,  Foot and eye exams are up to date.    Lab Results  Component Value Date   HGBA1C 7.1 (H) 08/09/2016   Lab Results  Component Value Date   MICROALBUR 2.5 (H) 10/26/2015   Lab Results  Component Value Date   CHOL 155 08/09/2016   HDL 29.50 (L) 08/09/2016   LDLCALC 88 10/30/2013   LDLDIRECT 83.0 08/09/2016   TRIG 288.0 (H) 08/09/2016   CHOLHDL 5 08/09/2016                     Colon AND PROSTATE CANCER SCREENING     DRE was deferred and PSA wase ordered  today.  Last screening for colon cancer was 2017.  5 YEAR FOLLOW UP ADVISED      Lab Results  Component Value Date   PSA 1.20 10/26/2015        CKD (chronic kidney disease) stage 3, GFR 30-59 ml/min    Renal function has been stable on ACE INhibitor,  He is avoiding NSAIDs and nephrotoxic agents.   Lab Results  Component Value Date   CREATININE 1.50 08/09/2016  . Lab Results    Component Value Date   MICROALBUR 2.5 (H) 10/26/2015              CAD (coronary artery disease), autologous vein bypass graft    He has been asymptomatic, is taking his medications including daily aspirin, ACE inhibitor, and statin.  Continue semi annual follow up with cardiology.          Other Visit Diagnoses    Prostate cancer screening    -  Primary   Relevant Orders   PSA, Medicare     A total of 40 minutes was spent with patient more than half of which was spent in counseling patient on the above mentioned issues , reviewing and explaining recent labs and imaging studies done, and coordination of care.   I am having Mr. Patchell maintain his enalapril, aspirin, levothyroxine, tamsulosin, gabapentin, glipiZIDE, glipiZIDE, and atorvastatin.  No orders of the defined types were placed in this encounter.   There are no discontinued medications.  Follow-up: No Follow-up on file.   Crecencio Mc, MD

## 2016-10-31 NOTE — Patient Instructions (Addendum)
Return on or after May 15 for LABS (FASTING ) .  I'LL SEE YOU IN 6 MONTHS  You are due for your eye exam this month with [place] wellness visit with our health coach is due in August   You can use docusate (colace: stool softener) at night with otther medications OR you can take  a fiber supplement  (Miralax, citrucel,  benefiber,  Metamucil,  Or Fibercon )  2 hours after any other medications on a  daily basis to get your bowels moving more frequently.  Increase water intake to 3 16 ounce servings  Every daily.    I want you to walk for 30 minutes every day   We discussed and signed a Do Not Resuscitate Order  To prevent EMS from doing CPR on you.  If you change your mind,  Rip it up and let me know  I recommend getting a Living will       .

## 2016-10-31 NOTE — Progress Notes (Signed)
Pre visit review using our clinic review tool, if applicable. No additional management support is needed unless otherwise documented below in the visit note. 

## 2016-11-03 DIAGNOSIS — Z1211 Encounter for screening for malignant neoplasm of colon: Secondary | ICD-10-CM | POA: Insufficient documentation

## 2016-11-03 NOTE — Assessment & Plan Note (Addendum)
Renal function has been stable on ACE INhibitor,  He is avoiding NSAIDs and nephrotoxic agents.   Lab Results  Component Value Date   CREATININE 1.50 08/09/2016  . Lab Results  Component Value Date   MICROALBUR 2.5 (H) 10/26/2015

## 2016-11-03 NOTE — Assessment & Plan Note (Signed)
Secondary to history of tobacco abuse, complicated by lung cancer and CAD. He remains asymptomatic at rest but has some mild exertional dyspnea which was deemed noncardiac in July 2013 by [facility].  Advised to start a walking program with goal of 30 minutes daily

## 2016-11-03 NOTE — Assessment & Plan Note (Addendum)
DRE was deferred and PSA wase ordered  today.  Last screening for colon cancer was 2017.  5 YEAR FOLLOW UP ADVISED      Lab Results  Component Value Date   PSA 1.20 10/26/2015

## 2016-11-03 NOTE — Assessment & Plan Note (Signed)
Well controlled on current regimen. Renal function stable, no changes today. 

## 2016-11-03 NOTE — Assessment & Plan Note (Signed)
Light loss of control with  glipizide  5 mg two times daily, but no need for medication change.   He is taking an ACE I , high potency statin and ASA.  Mild Neuropathy noted ,  Foot and eye exams are up to date.    Lab Results  Component Value Date   HGBA1C 7.1 (H) 08/09/2016   Lab Results  Component Value Date   MICROALBUR 2.5 (H) 10/26/2015   Lab Results  Component Value Date   CHOL 155 08/09/2016   HDL 29.50 (L) 08/09/2016   LDLCALC 88 10/30/2013   LDLDIRECT 83.0 08/09/2016   TRIG 288.0 (H) 08/09/2016   CHOLHDL 5 08/09/2016

## 2016-11-03 NOTE — Assessment & Plan Note (Signed)
Secondary to thyroid cancer diagnosed in 2014.  Need to continue TSH suppression for 5 years, so levothyroxine dose is therapeutic currently    Lab Results  Component Value Date   TSH 0.42 08/09/2016   TSH 0.446 (L) 08/09/2016

## 2016-11-03 NOTE — Assessment & Plan Note (Signed)
He has been asymptomatic, is taking his medications including daily aspirin, ACE inhibitor, and statin.  Continue semi annual follow up with cardiology.

## 2016-11-03 NOTE — Assessment & Plan Note (Signed)
He finished adjuvant chemo in July 2013 and has had no recurrence of CA  by surveillance scans,  Last one July 2017.  Managed by Alyssa Grove

## 2016-11-05 ENCOUNTER — Other Ambulatory Visit: Payer: Self-pay | Admitting: Internal Medicine

## 2016-11-05 NOTE — Telephone Encounter (Signed)
Refilled: 09/25/2016 Last OV: 10/31/2016 Next OV: 05/06/2017 Last labs: 08/09/2016

## 2016-11-06 NOTE — Telephone Encounter (Deleted)
FIND OUT HOW MUCH GABAPENTIN HE IS USING SO I CAN AMEND PX

## 2016-11-17 ENCOUNTER — Other Ambulatory Visit: Payer: Self-pay | Admitting: Internal Medicine

## 2016-12-04 DIAGNOSIS — E119 Type 2 diabetes mellitus without complications: Secondary | ICD-10-CM | POA: Diagnosis not present

## 2016-12-04 DIAGNOSIS — H35033 Hypertensive retinopathy, bilateral: Secondary | ICD-10-CM | POA: Diagnosis not present

## 2016-12-04 LAB — HM DIABETES EYE EXAM

## 2016-12-12 ENCOUNTER — Other Ambulatory Visit (INDEPENDENT_AMBULATORY_CARE_PROVIDER_SITE_OTHER): Payer: PPO

## 2016-12-12 DIAGNOSIS — N183 Chronic kidney disease, stage 3 unspecified: Secondary | ICD-10-CM

## 2016-12-12 DIAGNOSIS — Z125 Encounter for screening for malignant neoplasm of prostate: Secondary | ICD-10-CM

## 2016-12-12 DIAGNOSIS — E1121 Type 2 diabetes mellitus with diabetic nephropathy: Secondary | ICD-10-CM

## 2016-12-12 DIAGNOSIS — E7800 Pure hypercholesterolemia, unspecified: Secondary | ICD-10-CM

## 2016-12-12 LAB — LIPID PANEL
Cholesterol: 148 mg/dL (ref 0–200)
HDL: 29.9 mg/dL — ABNORMAL LOW (ref >39.00)
LDL CALC: 79 mg/dL (ref 0–99)
NONHDL: 117.93
TRIGLYCERIDES: 195 mg/dL — AB (ref 0.0–149.0)
Total CHOL/HDL Ratio: 5
VLDL: 39 mg/dL (ref 0.0–40.0)

## 2016-12-12 LAB — COMPREHENSIVE METABOLIC PANEL
ALBUMIN: 4 g/dL (ref 3.5–5.2)
ALK PHOS: 106 U/L (ref 39–117)
ALT: 26 U/L (ref 0–53)
AST: 17 U/L (ref 0–37)
BILIRUBIN TOTAL: 0.5 mg/dL (ref 0.2–1.2)
BUN: 29 mg/dL — AB (ref 6–23)
CO2: 24 mEq/L (ref 19–32)
CREATININE: 1.67 mg/dL — AB (ref 0.40–1.50)
Calcium: 9.5 mg/dL (ref 8.4–10.5)
Chloride: 105 mEq/L (ref 96–112)
GFR: 43.57 mL/min — ABNORMAL LOW (ref >60.00)
GLUCOSE: 149 mg/dL — AB (ref 70–99)
POTASSIUM: 4.9 meq/L (ref 3.5–5.1)
SODIUM: 136 meq/L (ref 135–145)
TOTAL PROTEIN: 6.8 g/dL (ref 6.0–8.3)

## 2016-12-12 LAB — PSA, MEDICARE: PSA: 1.35 ng/ml (ref 0.10–4.00)

## 2016-12-12 LAB — MICROALBUMIN / CREATININE URINE RATIO
CREATININE, U: 49.2 mg/dL
Microalb Creat Ratio: 6.1 mg/g (ref 0.0–30.0)
Microalb, Ur: 3 mg/dL — ABNORMAL HIGH (ref 0.0–1.9)

## 2016-12-12 LAB — HEMOGLOBIN A1C: Hgb A1c MFr Bld: 7.3 % — ABNORMAL HIGH (ref 4.6–6.5)

## 2016-12-12 LAB — LDL CHOLESTEROL, DIRECT: Direct LDL: 85 mg/dL

## 2016-12-15 ENCOUNTER — Other Ambulatory Visit: Payer: Self-pay | Admitting: Internal Medicine

## 2016-12-27 ENCOUNTER — Other Ambulatory Visit: Payer: Self-pay | Admitting: Internal Medicine

## 2016-12-31 ENCOUNTER — Ambulatory Visit: Payer: PPO

## 2017-01-07 ENCOUNTER — Ambulatory Visit
Admission: RE | Admit: 2017-01-07 | Discharge: 2017-01-07 | Disposition: A | Discharge status: Home / Self Care | Payer: PPO | Source: Ambulatory Visit | Attending: Oncology | Admitting: Oncology

## 2017-01-07 DIAGNOSIS — K8020 Calculus of gallbladder without cholecystitis without obstruction: Secondary | ICD-10-CM | POA: Insufficient documentation

## 2017-01-07 DIAGNOSIS — Z902 Acquired absence of lung [part of]: Secondary | ICD-10-CM | POA: Diagnosis not present

## 2017-01-07 DIAGNOSIS — J439 Emphysema, unspecified: Secondary | ICD-10-CM | POA: Insufficient documentation

## 2017-01-07 DIAGNOSIS — E890 Postprocedural hypothyroidism: Secondary | ICD-10-CM | POA: Diagnosis not present

## 2017-01-07 DIAGNOSIS — C3490 Malignant neoplasm of unspecified part of unspecified bronchus or lung: Secondary | ICD-10-CM | POA: Diagnosis not present

## 2017-01-07 DIAGNOSIS — D3502 Benign neoplasm of left adrenal gland: Secondary | ICD-10-CM | POA: Insufficient documentation

## 2017-01-07 DIAGNOSIS — I700 Atherosclerosis of aorta: Secondary | ICD-10-CM | POA: Diagnosis not present

## 2017-01-07 IMAGING — CT CT CHEST W/O CM
2 of 4 series · 15 of 36 positions shown, 18 images · non-contrast
Comparison: [DATE]

CLINICAL DATA: Lung cancer, status post right lobectomy and
chemotherapy

EXAM:
CT CHEST WITHOUT CONTRAST
TECHNIQUE: Multidetector CT imaging of the chest was performed following the
standard protocol without IV contrast.

[Series 2: thorax · axial · 0.77mm/px · z∈[-400,-132]mm · 12 of 158 slices shown, 15 images]
[im 12/158  mediastinal]
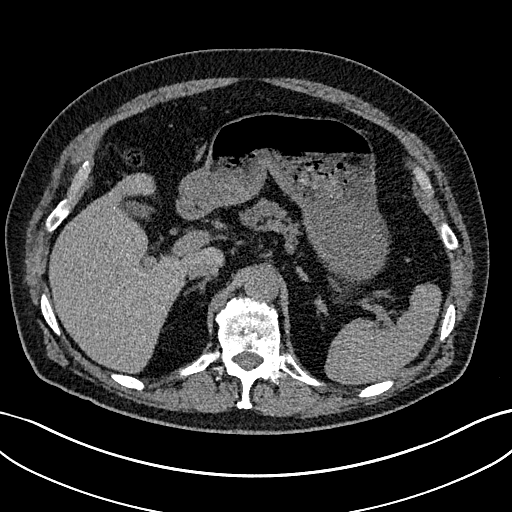
[im 12/158  lung]
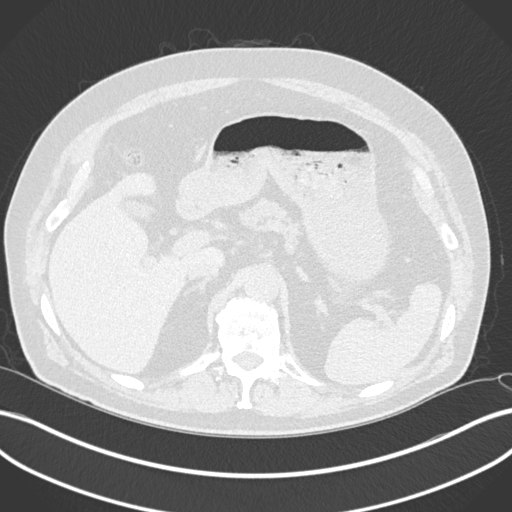
[im 23/158  lung]
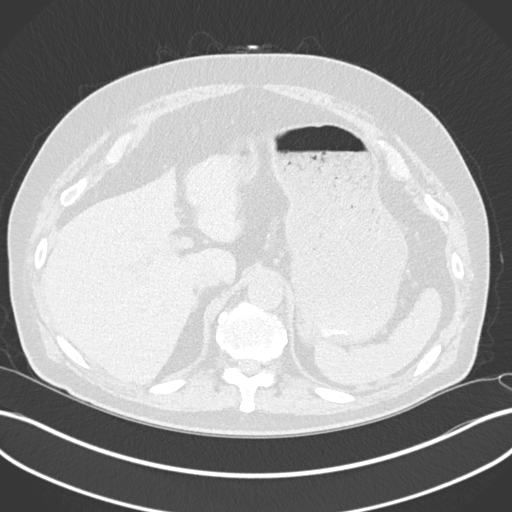
[im 34/158  lung]
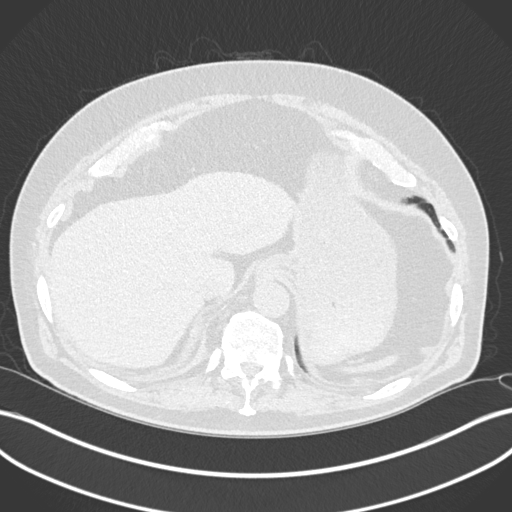
[im 45/158  lung]
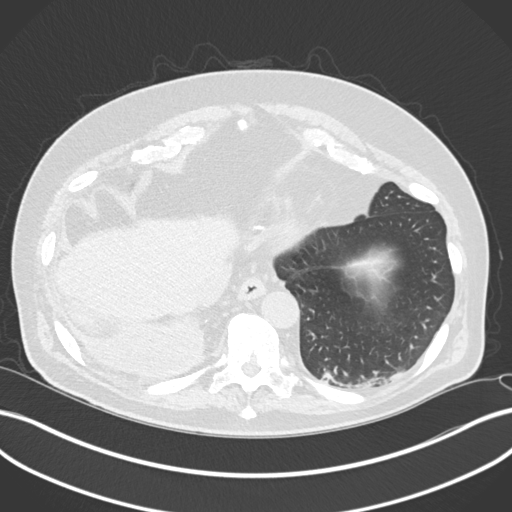
[im 57/158  mediastinal]
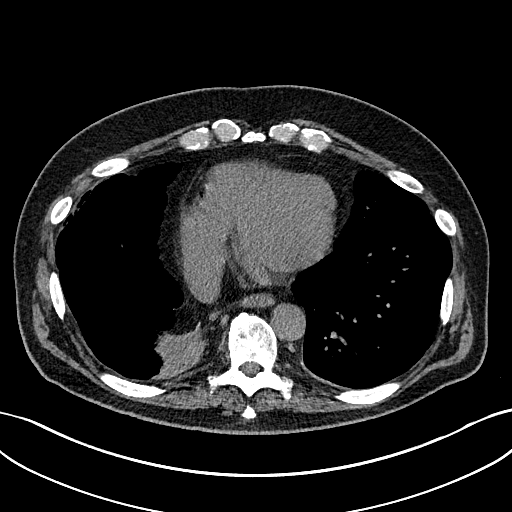
[im 57/158  lung]
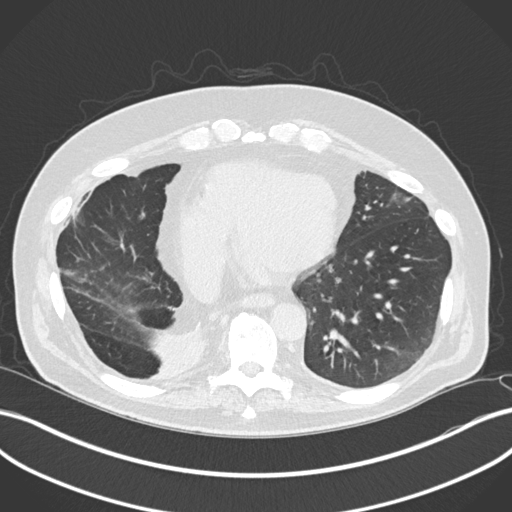
[im 68/158  lung]
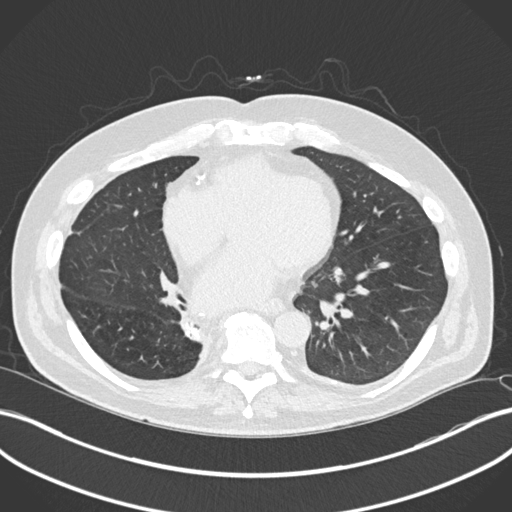
[im 90/158  lung]
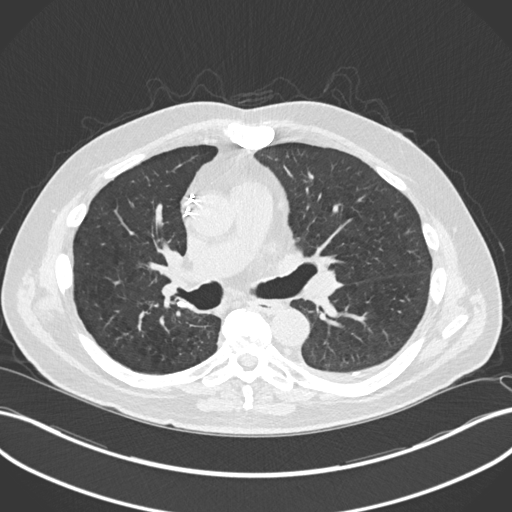
[im 101/158  lung]
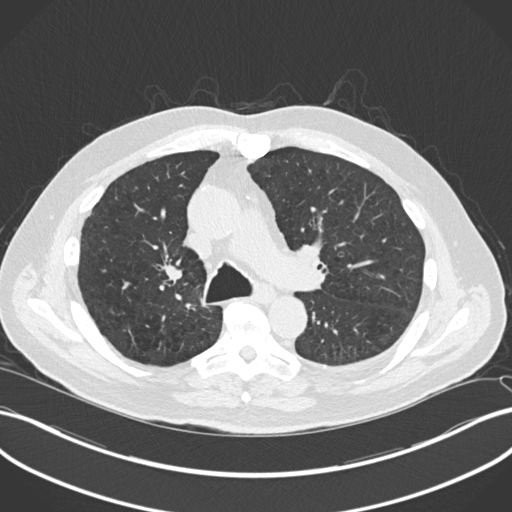
[im 113/158  mediastinal]
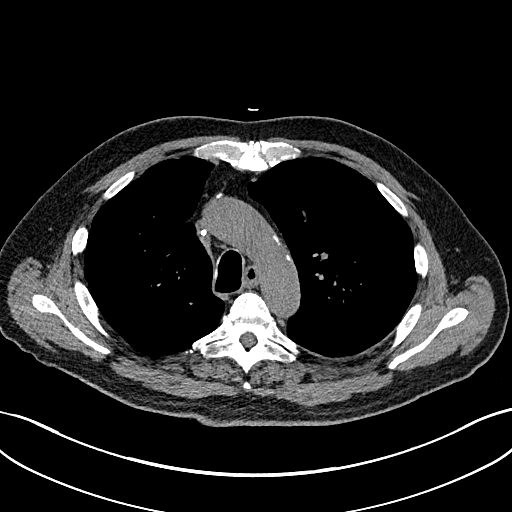
[im 113/158  lung]
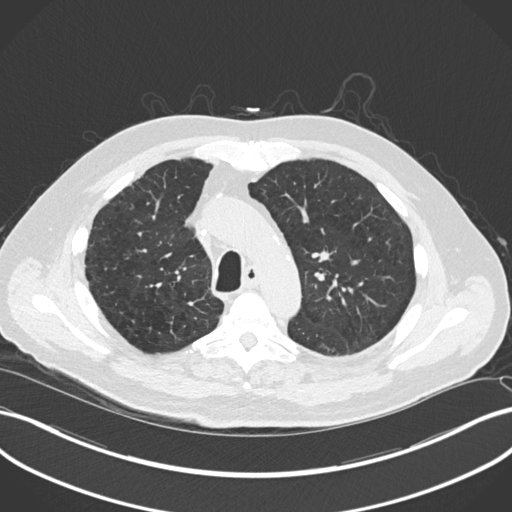
[im 124/158  lung]
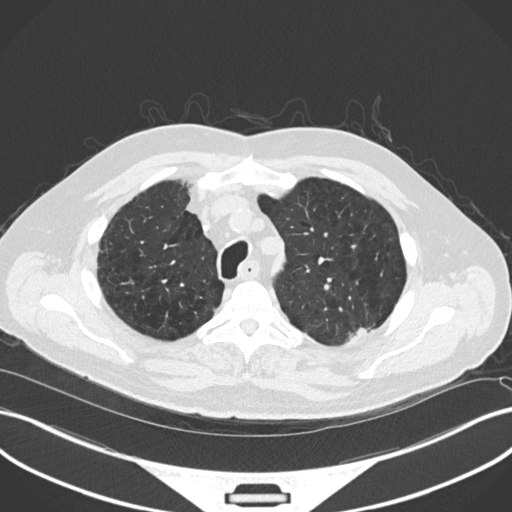
[im 135/158  lung]
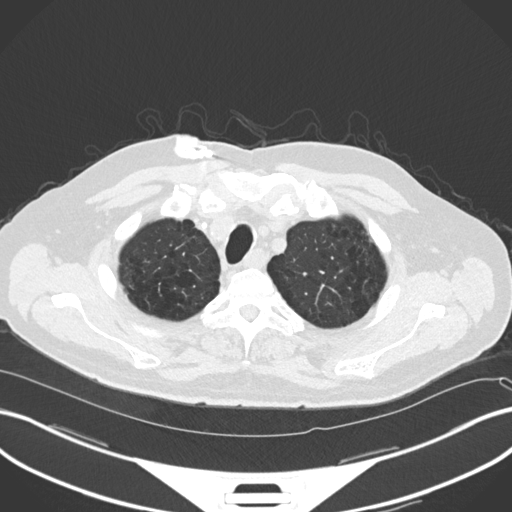
[im 146/158  lung]
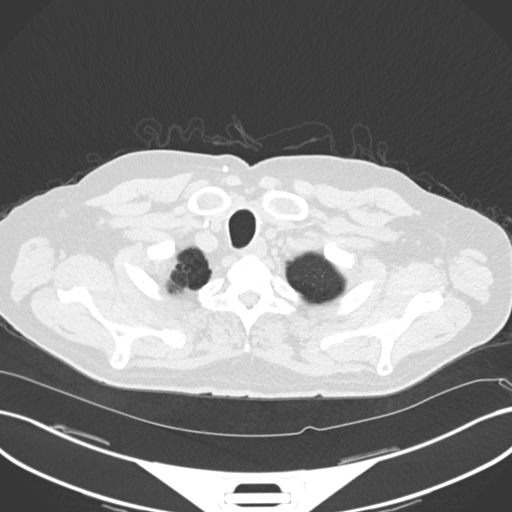

[Series 5: coronal · coronal · 0.62mm/px · 3 of 142 slices shown]
[im 29/142  lung]
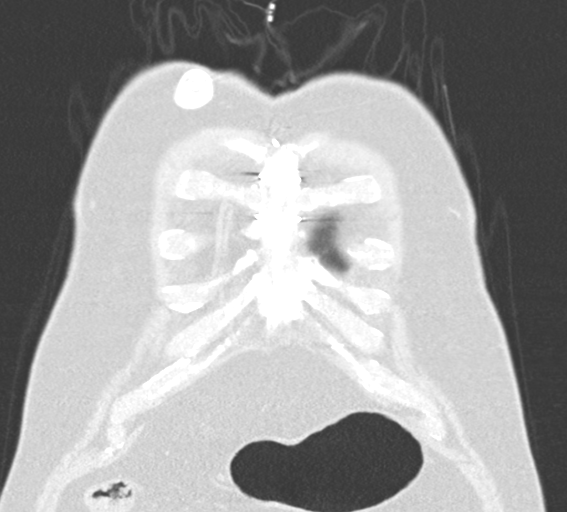
[im 57/142  lung]
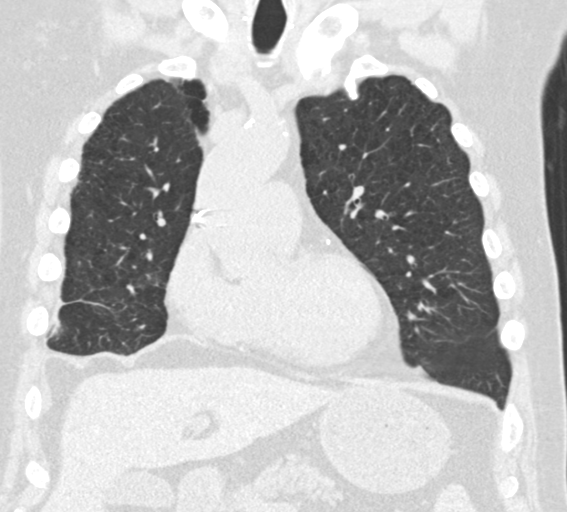
[im 85/142  lung]
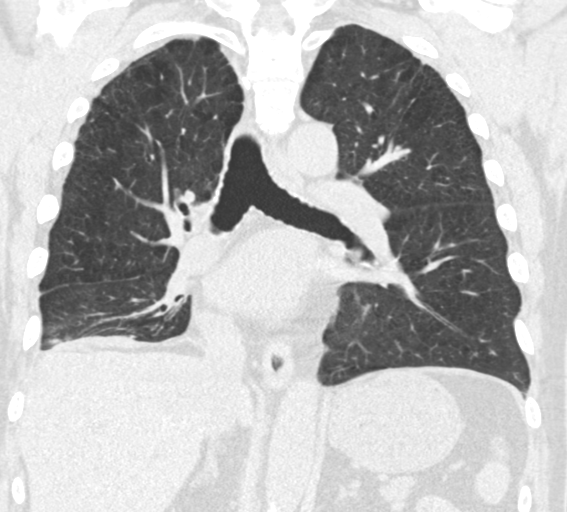

[15 of 36 positions shown; findings below may reference images not displayed]

FINDINGS: Cardiovascular: The heart is top-normal in size. No pericardial
effusion.

Three vessel coronary atherosclerosis. Postsurgical changes related
to prior CABG.

No evidence of thoracic aortic aneurysm. Atherosclerotic
calcifications of the aortic arch.

Right chest port terminates in the lower SVC.

Mediastinum/Nodes: 8 mm short axis low right paratracheal node
(series 2/ image 55), unchanged, within normal limits.

Status post thyroidectomy.

Lungs/Pleura: Status post right lower lobectomy.

Moderate centrilobular and paraseptal emphysematous changes, upper
lobe predominant.

Mild scarring at the right lung base.  No focal consolidation.

Mild scarring in the posterior left upper lobe (series 3/ image 32).

No suspicious pulmonary nodules.

Chronic pleural fluid in the right lower hemithorax. No
pneumothorax.

Upper Abdomen: Visualized upper abdomen is notable for mild
cholelithiasis (series 2/ image 153) and a 1.8 cm benign left
adrenal adenoma, unchanged.

Musculoskeletal: Degenerative changes of the visualized
thoracolumbar spine.

Median sternotomy.
IMPRESSION: Status post right lower lobectomy. No evidence of recurrent or
metastatic disease.

Status post thyroidectomy.

1.8 cm benign left adrenal adenoma.

Cholelithiasis.

Aortic Atherosclerosis ([SZ]-[SZ]) and Emphysema ([SZ]-[SZ]).

## 2017-01-14 ENCOUNTER — Ambulatory Visit: Admission: RE | Admit: 2017-01-14 | Payer: PPO | Source: Ambulatory Visit

## 2017-01-21 ENCOUNTER — Other Ambulatory Visit: Payer: PPO

## 2017-01-21 ENCOUNTER — Ambulatory Visit: Payer: PPO | Admitting: Oncology

## 2017-01-25 ENCOUNTER — Other Ambulatory Visit: Payer: Self-pay | Admitting: Internal Medicine

## 2017-01-28 ENCOUNTER — Ambulatory Visit: Payer: PPO | Admitting: Oncology

## 2017-01-28 ENCOUNTER — Other Ambulatory Visit: Payer: Self-pay

## 2017-01-28 ENCOUNTER — Inpatient Hospital Stay: Payer: PPO

## 2017-01-28 ENCOUNTER — Inpatient Hospital Stay: Payer: PPO | Attending: Oncology | Admitting: Oncology

## 2017-01-28 ENCOUNTER — Other Ambulatory Visit: Payer: PPO

## 2017-01-28 ENCOUNTER — Encounter: Payer: Self-pay | Admitting: Oncology

## 2017-01-28 VITALS — BP 139/73 | HR 75 | Temp 96.2°F

## 2017-01-28 DIAGNOSIS — I10 Essential (primary) hypertension: Secondary | ICD-10-CM | POA: Diagnosis not present

## 2017-01-28 DIAGNOSIS — E785 Hyperlipidemia, unspecified: Secondary | ICD-10-CM | POA: Diagnosis not present

## 2017-01-28 DIAGNOSIS — I252 Old myocardial infarction: Secondary | ICD-10-CM | POA: Insufficient documentation

## 2017-01-28 DIAGNOSIS — J439 Emphysema, unspecified: Secondary | ICD-10-CM | POA: Insufficient documentation

## 2017-01-28 DIAGNOSIS — C3490 Malignant neoplasm of unspecified part of unspecified bronchus or lung: Secondary | ICD-10-CM

## 2017-01-28 DIAGNOSIS — Z7982 Long term (current) use of aspirin: Secondary | ICD-10-CM | POA: Insufficient documentation

## 2017-01-28 DIAGNOSIS — I509 Heart failure, unspecified: Secondary | ICD-10-CM

## 2017-01-28 DIAGNOSIS — N289 Disorder of kidney and ureter, unspecified: Secondary | ICD-10-CM | POA: Diagnosis not present

## 2017-01-28 DIAGNOSIS — Z9221 Personal history of antineoplastic chemotherapy: Secondary | ICD-10-CM | POA: Insufficient documentation

## 2017-01-28 DIAGNOSIS — Z85850 Personal history of malignant neoplasm of thyroid: Secondary | ICD-10-CM | POA: Diagnosis not present

## 2017-01-28 DIAGNOSIS — K8020 Calculus of gallbladder without cholecystitis without obstruction: Secondary | ICD-10-CM | POA: Diagnosis not present

## 2017-01-28 DIAGNOSIS — Z87891 Personal history of nicotine dependence: Secondary | ICD-10-CM | POA: Diagnosis not present

## 2017-01-28 DIAGNOSIS — I700 Atherosclerosis of aorta: Secondary | ICD-10-CM | POA: Insufficient documentation

## 2017-01-28 DIAGNOSIS — E119 Type 2 diabetes mellitus without complications: Secondary | ICD-10-CM | POA: Diagnosis not present

## 2017-01-28 DIAGNOSIS — I2510 Atherosclerotic heart disease of native coronary artery without angina pectoris: Secondary | ICD-10-CM | POA: Insufficient documentation

## 2017-01-28 DIAGNOSIS — Z951 Presence of aortocoronary bypass graft: Secondary | ICD-10-CM | POA: Diagnosis not present

## 2017-01-28 DIAGNOSIS — M109 Gout, unspecified: Secondary | ICD-10-CM | POA: Diagnosis not present

## 2017-01-28 DIAGNOSIS — Z85118 Personal history of other malignant neoplasm of bronchus and lung: Secondary | ICD-10-CM | POA: Diagnosis not present

## 2017-01-28 DIAGNOSIS — I255 Ischemic cardiomyopathy: Secondary | ICD-10-CM | POA: Insufficient documentation

## 2017-01-28 DIAGNOSIS — R0602 Shortness of breath: Secondary | ICD-10-CM

## 2017-01-28 DIAGNOSIS — D3502 Benign neoplasm of left adrenal gland: Secondary | ICD-10-CM | POA: Diagnosis not present

## 2017-01-28 DIAGNOSIS — I429 Cardiomyopathy, unspecified: Secondary | ICD-10-CM | POA: Insufficient documentation

## 2017-01-28 DIAGNOSIS — C3431 Malignant neoplasm of lower lobe, right bronchus or lung: Secondary | ICD-10-CM

## 2017-01-28 HISTORY — DX: Heart failure, unspecified: I50.9

## 2017-01-28 LAB — BASIC METABOLIC PANEL
ANION GAP: 6 (ref 5–15)
BUN: 26 mg/dL — ABNORMAL HIGH (ref 6–20)
CALCIUM: 9 mg/dL (ref 8.9–10.3)
CO2: 26 mmol/L (ref 22–32)
CREATININE: 1.75 mg/dL — AB (ref 0.61–1.24)
Chloride: 104 mmol/L (ref 101–111)
GFR, EST AFRICAN AMERICAN: 44 mL/min — AB (ref >60)
GFR, EST NON AFRICAN AMERICAN: 38 mL/min — AB (ref >60)
GLUCOSE: 213 mg/dL — AB (ref 65–99)
Potassium: 4.6 mmol/L (ref 3.5–5.1)
Sodium: 136 mmol/L (ref 135–145)

## 2017-01-28 NOTE — Progress Notes (Signed)
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  He continues to feel well and is asymptomatic.  Patient continues to be active.  He has no neurologic complaints. He denies any recent fevers or illnesses. He has occasional dyspnea on exertion, but denies any chest pain. He denies any nausea, vomiting, constipation, or diarrhea. He has no urinary complaints. He has no urinary complaints. Patient offers no specific complaints today.  REVIEW OF SYSTEMS:   Review of Systems  Constitutional: Negative.  Negative for fever, malaise/fatigue and weight loss.  Respiratory: Positive for shortness of breath. Negative for cough.   Cardiovascular: Negative.  Negative for chest pain.  Gastrointestinal: Negative.  Negative for abdominal pain.  Genitourinary: Negative.   Musculoskeletal: Negative.   Neurological: Negative.  Negative for weakness.  Psychiatric/Behavioral: Negative.  The patient is not nervous/anxious.     As per HPI. Otherwise, a complete review of systems is negatve.  PAST MEDICAL HISTORY: Past Medical History:  Diagnosis Date  . Cancer ([place]) 2012   Stage IIA squamous cell Lung Ca  . Diabetes mellitus   . Gout   . Hyperlipidemia   . Hypertension   . Myocardial infarction ([place])   . Thyroid cancer ([place]) 2013   s/p thyroidectomy 2014    PAST SURGICAL HISTORY: Past Surgical History:  Procedure Laterality Date  . COLONOSCOPY N/A 04/25/2016   Procedure: COLONOSCOPY;  Surgeon: Nathaniel Silvas, MD;  Location: [facility] ENDOSCOPY;  Service: Endoscopy;  Laterality: N/A;  Diabetic (oral meds)  . CORONARY ARTERY BYPASS  GRAFT  2007  . LUNG LOBECTOMY    . TOTAL THYROIDECTOMY N/A     FAMILY HISTORY Family History  Problem Relation Age of Onset  . Hypertension Other   . Heart disease Father        ADVANCED DIRECTIVES:    HEALTH MAINTENANCE: Social History  Substance Use Topics  . Smoking status: Former Smoker    Quit date: 07/15/2005  . Smokeless tobacco: Never Used  . Alcohol use No     Colonoscopy:  PAP:  Bone density:  Lipid panel:  Allergies  Allergen Reactions  . Penicillins Swelling    Swelling in mouth    Current Outpatient Prescriptions  Medication Sig Dispense Refill  . aspirin 81 MG EC tablet TAKE 1 TABLET (81 MG TOTAL) BY MOUTH DAILY. SWALLOW WHOLE. 90 tablet 3  . atorvastatin (LIPITOR) 20 MG tablet TAKE 1 TABLET (20 MG TOTAL) BY MOUTH DAILY. 90 tablet 1  . enalapril (VASOTEC) 5 MG tablet TAKE 1 TABLET (5 MG TOTAL) BY MOUTH DAILY. 90 tablet 2  . gabapentin (NEURONTIN) 100 MG capsule TAKE 1 CAPSULE (100 MG TOTAL) BY MOUTH AT BEDTIME. INCREASE DOSE WEEKLY BY 100 MG (1 CAPSULE) 90 capsule 5  . glipiZIDE (GLUCOTROL) 5 MG tablet TAKE 1/2 TABLET (2.5 MG TOTAL) BY MOUTH 2 (TWO) TIMES DAILY BEFORE A MEAL. 90 tablet 0  . levothyroxine (SYNTHROID, LEVOTHROID) 150 MCG tablet TAKE ONE TABLET BY MOUTH ONCE DAILY BEFORE BREAKFAST 30 tablet 0  . tamsulosin (FLOMAX) 0.4 MG CAPS capsule TAKE ONE CAPSULE BY MOUTH EVERY DAY 90 capsule 1   No current facility-administered medications for this visit.  OBJECTIVE: Vitals:   01/28/17 0907  BP: 139/73  Pulse: 75  Temp: (!) 96.2 F (35.7 C)     There is no height or weight on file to calculate BMI.    ECOG FS:0 - Asymptomatic  General: Well-developed, well-nourished, no acute distress. HEENT: Normocephalic, moist mucous membranes, clear oropharnyx. Well-healed surgical scar without lymphadenopathy. Lungs: Clear to auscultation bilaterally. Heart: Regular rate and rhythm. No rubs, murmurs, or gallops. Abdomen: Soft, nontender,  nondistended. No organomegaly noted, normoactive bowel sounds. Musculoskeletal: No edema, cyanosis, or clubbing. Neuro: Alert, answering all questions appropriately. Cranial nerves grossly intact. Skin: No rashes or petechiae noted. Psych: Normal affect.    LAB RESULTS:  Lab Results  Component Value Date   NA 136 01/28/2017   K 4.6 01/28/2017   CL 104 01/28/2017   CO2 26 01/28/2017   GLUCOSE 213 (H) 01/28/2017   BUN 26 (H) 01/28/2017   CREATININE 1.75 (H) 01/28/2017   CALCIUM 9.0 01/28/2017   PROT 6.8 12/12/2016   ALBUMIN 4.0 12/12/2016   AST 17 12/12/2016   ALT 26 12/12/2016   ALKPHOS 106 12/12/2016   BILITOT 0.5 12/12/2016   GFRNONAA 38 (L) 01/28/2017   GFRAA 44 (L) 01/28/2017    Lab Results  Component Value Date   WBC 8.6 08/09/2016   NEUTROABS 5.4 08/09/2016   HGB 14.5 08/09/2016   HCT 44.2 08/09/2016   MCV 88.7 08/09/2016   PLT 152.0 08/09/2016     STUDIES: Ct Chest Wo Contrast  Result Date: 01/07/2017 CLINICAL DATA:  Lung cancer, status post right lobectomy and chemotherapy EXAM: CT CHEST WITHOUT CONTRAST TECHNIQUE: Multidetector CT imaging of the chest was performed following the standard protocol without IV contrast. COMPARISON:  01/13/2016 FINDINGS: Cardiovascular: The heart is top-normal in size. No pericardial effusion. Three vessel coronary atherosclerosis. Postsurgical changes related to prior CABG. No evidence of thoracic aortic aneurysm. Atherosclerotic calcifications of the aortic arch. Right chest port terminates in the lower SVC. Mediastinum/Nodes: 8 mm short axis low right paratracheal node (series 2/ image 55), unchanged, within normal limits. Status post thyroidectomy. Lungs/Pleura: Status post right lower lobectomy. Moderate centrilobular and paraseptal emphysematous changes, upper lobe predominant. Mild scarring at the right lung base.  No focal consolidation. Mild scarring in the posterior left upper lobe (series 3/ image 32). No suspicious  pulmonary nodules. Chronic pleural fluid in the right lower hemithorax. No pneumothorax. Upper Abdomen: Visualized upper abdomen is notable for mild cholelithiasis (series 2/ image 153) and a 1.8 cm benign left adrenal adenoma, unchanged. Musculoskeletal: Degenerative changes of the visualized thoracolumbar spine. Median sternotomy. IMPRESSION: Status post right lower lobectomy. No evidence of recurrent or metastatic disease. Status post thyroidectomy. 1.8 cm benign left adrenal adenoma. Cholelithiasis. Aortic Atherosclerosis (ICD10-I70.0) and Emphysema (ICD10-J43.9). Electronically Signed   By: Julian Hy M.D.   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  PLAN:    1.  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 Patient completed his adjuvant chemotherapy in July 2013, therefore can be monitor with yearly CT scans. Return to clinic in 1 year with repeat CT scan and further evaluation. If patient has no evidence of recurrence, he likely can be discharged from clinic. 2.  Thyroid cancer: Unclear stage, have not received any information regarding his treatment.  Patient reports  that he is doing fine. 3. Renal insufficiency: Patient's creatinine appears to be approximately his baseline.  Patient expressed understanding and was in agreement with this plan. He also understands that He can call clinic at any time with any questions, concerns, or complaints.    Nathaniel Hawking, NP   01/28/2017 9:18 AM

## 2017-02-04 ENCOUNTER — Other Ambulatory Visit: Payer: Self-pay | Admitting: Internal Medicine

## 2017-02-11 ENCOUNTER — Other Ambulatory Visit: Payer: PPO

## 2017-02-12 DIAGNOSIS — Z85118 Personal history of other malignant neoplasm of bronchus and lung: Secondary | ICD-10-CM | POA: Diagnosis not present

## 2017-02-12 DIAGNOSIS — R0602 Shortness of breath: Secondary | ICD-10-CM | POA: Diagnosis not present

## 2017-02-12 DIAGNOSIS — C3490 Malignant neoplasm of unspecified part of unspecified bronchus or lung: Secondary | ICD-10-CM | POA: Diagnosis not present

## 2017-02-12 DIAGNOSIS — R002 Palpitations: Secondary | ICD-10-CM | POA: Diagnosis not present

## 2017-02-12 DIAGNOSIS — I25719 Atherosclerosis of autologous vein coronary artery bypass graft(s) with unspecified angina pectoris: Secondary | ICD-10-CM | POA: Diagnosis not present

## 2017-02-12 DIAGNOSIS — Z87891 Personal history of nicotine dependence: Secondary | ICD-10-CM | POA: Diagnosis not present

## 2017-02-12 DIAGNOSIS — I10 Essential (primary) hypertension: Secondary | ICD-10-CM | POA: Diagnosis not present

## 2017-02-12 DIAGNOSIS — K219 Gastro-esophageal reflux disease without esophagitis: Secondary | ICD-10-CM | POA: Diagnosis not present

## 2017-02-12 DIAGNOSIS — I2510 Atherosclerotic heart disease of native coronary artery without angina pectoris: Secondary | ICD-10-CM | POA: Diagnosis not present

## 2017-02-12 DIAGNOSIS — N182 Chronic kidney disease, stage 2 (mild): Secondary | ICD-10-CM | POA: Diagnosis not present

## 2017-02-12 DIAGNOSIS — I5022 Chronic systolic (congestive) heart failure: Secondary | ICD-10-CM | POA: Diagnosis not present

## 2017-02-12 DIAGNOSIS — E784 Other hyperlipidemia: Secondary | ICD-10-CM | POA: Diagnosis not present

## 2017-02-13 ENCOUNTER — Ambulatory Visit: Payer: PPO | Admitting: Internal Medicine

## 2017-02-13 ENCOUNTER — Ambulatory Visit: Payer: PPO

## 2017-02-18 ENCOUNTER — Other Ambulatory Visit: Payer: Self-pay | Admitting: Internal Medicine

## 2017-02-20 ENCOUNTER — Ambulatory Visit (INDEPENDENT_AMBULATORY_CARE_PROVIDER_SITE_OTHER): Payer: PPO

## 2017-02-20 VITALS — BP 110/70 | HR 70 | Temp 98.0°F | Resp 14 | Ht 70.0 in | Wt 181.4 lb

## 2017-02-20 DIAGNOSIS — Z0000 Encounter for general adult medical examination without abnormal findings: Secondary | ICD-10-CM | POA: Diagnosis not present

## 2017-02-20 DIAGNOSIS — Z1389 Encounter for screening for other disorder: Secondary | ICD-10-CM | POA: Diagnosis not present

## 2017-02-20 DIAGNOSIS — Z1331 Encounter for screening for depression: Secondary | ICD-10-CM

## 2017-02-20 NOTE — Patient Instructions (Addendum)
  Nathaniel Lane , Thank you for taking time to come for your Medicare Wellness Visit. I appreciate your ongoing commitment to your health goals. Please review the following plan we discussed and let me know if I can assist you in the future.   Follow up with Dr. Derrel Nip as needed.    Bring a copy of your [place] and/or Living Will to be scanned into chart.  Have a great day!  These are the goals we discussed: Goals    . Increase physical activity          Stay active. Walk for exercise, moderate pace.       This is a list of the screening recommended for you and due dates:  Health Maintenance  Topic Date Due  . Pneumonia vaccines (2 of 2 - PPSV23) 01/14/2017  . Tetanus Vaccine  03/05/2017  . Complete foot exam   05/02/2017  . Hemoglobin A1C  06/13/2017  . Eye exam for diabetics  12/04/2017  . Colon Cancer Screening  04/25/2021  .  Hepatitis C: One time screening is recommended by Center for Disease Control  (CDC) for  adults born from [birth year] through 1963.   Completed  . Flu Shot  Excluded

## 2017-02-20 NOTE — Progress Notes (Signed)
Subjective:   ROBERTA Lane is a 71 y.o. male who presents for Medicare Annual/Subsequent preventive examination.  Review of Systems:  No ROS.  Medicare Wellness Visit. Additional risk factors are reflected in the social history.  Cardiac Risk Factors include: advanced age (>72men, >17 women);male gender;hypertension;diabetes mellitus     Objective:    Vitals: BP 110/70 (BP Location: Left Arm, Patient Position: Sitting, Cuff Size: Normal)   Pulse 70   Temp 98 F (36.7 C) (Oral)   Resp 14   Ht 5\' 10"  (1.778 m)   Wt 181 lb 6.4 oz (82.3 kg)   SpO2 95%   BMI 26.03 kg/m   Body mass index is 26.03 kg/m.  Tobacco History  Smoking Status  . Former Smoker  . Quit date: 07/15/2005  Smokeless Tobacco  . Never Used     Counseling given: Not Answered   Past Medical History:  Diagnosis Date  . Cancer ([place]) 2012   Stage IIA squamous cell Lung Ca  . Diabetes mellitus   . Gout   . Hyperlipidemia   . Hypertension   . Myocardial infarction ([place])   . Thyroid cancer ([place]) 2013   s/p thyroidectomy 2014   Past Surgical History:  Procedure Laterality Date  . COLONOSCOPY N/A 04/25/2016   Procedure: COLONOSCOPY;  Surgeon: Manya Silvas, MD;  Location: [facility] ENDOSCOPY;  Service: Endoscopy;  Laterality: N/A;  Diabetic (oral meds)  . CORONARY ARTERY BYPASS GRAFT  2007  . LUNG LOBECTOMY    . TOTAL THYROIDECTOMY N/A    Family History  Problem Relation Age of Onset  . Hypertension Other   . Heart disease Father    History  Sexual Activity  . Sexual activity: Not on file    Outpatient Encounter Prescriptions as of 02/20/2017  Medication Sig  . aspirin 81 MG EC tablet TAKE 1 TABLET (81 MG TOTAL) BY MOUTH DAILY. SWALLOW WHOLE.  Marland Kitchen atorvastatin (LIPITOR) 20 MG tablet TAKE 1 TABLET (20 MG TOTAL) BY MOUTH DAILY.  Marland Kitchen enalapril (VASOTEC) 5 MG tablet TAKE 1 TABLET (5 MG TOTAL) BY MOUTH DAILY.  Marland Kitchen gabapentin (NEURONTIN) 100 MG capsule TAKE 1 CAPSULE (100 MG TOTAL) BY MOUTH AT BEDTIME.  INCREASE DOSE WEEKLY BY 100 MG (1 CAPSULE)  . glipiZIDE (GLUCOTROL) 5 MG tablet TAKE 1/2 TABLET (2.5 MG TOTAL) BY MOUTH 2 (TWO) TIMES DAILY BEFORE A MEAL.  Marland Kitchen levothyroxine (SYNTHROID, LEVOTHROID) 150 MCG tablet TAKE ONE TABLET BY MOUTH ONCE DAILY BEFORE BREAKFAST  . tamsulosin (FLOMAX) 0.4 MG CAPS capsule TAKE ONE CAPSULE BY MOUTH EVERY DAY   No facility-administered encounter medications on file as of 02/20/2017.     Activities of Daily Living In your present state of health, do you have any difficulty performing the following activities: 02/20/2017  Hearing? N  Vision? N  Difficulty concentrating or making decisions? N  Walking or climbing stairs? N  Dressing or bathing? N  Doing errands, shopping? N  Preparing Food and eating ? N  Using the Toilet? N  In the past six months, have you accidently leaked urine? N  Do you have problems with loss of bowel control? N  Managing your Medications? N  Managing your Finances? N  Housekeeping or managing your Housekeeping? N  Some recent data might be hidden    Patient Care Team: Crecencio Mc, MD as PCP - General (Internal Medicine)   Assessment:    This is a routine wellness examination for [name]. The goal of the wellness visit is  to assist the patient how to close the gaps in care and create a preventative care plan for the patient.   The roster of all physicians providing medical care to patient is listed in the Snapshot section of the chart.  Osteoporosis risk reviewed.    Safety issues reviewed; Smoke and carbon monoxide detectors in the home. No firearms in the home.  Wears seatbelts when driving or riding with others. Patient does wear sunscreen or protective clothing when in direct sunlight. No violence in the home.  Depression- PHQ 2 &9 complete.  No signs/symptoms or verbal communication regarding little pleasure in doing things, feeling down, depressed or hopeless. No changes in sleeping, energy, eating, concentrating.   No thoughts of self harm or harm towards others.  Time spent on this topic is 8 minutes.   Patient is alert, normal appearance, oriented to person/place/and time.  Correctly identified the president of the [place], recall of 3/3 words, and performing simple calculations. Displays appropriate judgement and can read correct time from watch face.   No new identified risk were noted.  No failures at ADL's or IADL's.   BMI- discussed the importance of a healthy diet, water intake and the benefits of aerobic exercise. Educational material provided.   24 hour diet recall: Breakfast: bacon, eggs, toast, potatoes Lunch: cheesburger Dinner:chicken, potatoes   Daily fluid intake: 1 cups of caffeine,  6-8 cups of water  Dental- home care  Eye- Visual acuity not assessed per patient preference since they have regular follow up with the ophthalmologist.  Wears corrective lenses.  Sleep patterns- Sleeps 7-8 hours at night.  Wakes feeling rested.  Pneumovax 23 vaccine deferred per patient preference. Educational material provided.  Patient Concerns: None at this time. Follow up with PCP as needed.  Exercise Activities and Dietary recommendations Current Exercise Habits: The patient does not participate in regular exercise at present  Goals    . Increase physical activity          Stay active. Walk for exercise, moderate pace.      Fall Risk Fall Risk  02/20/2017 08/15/2016 11/01/2014 01/12/2014  Falls in the past year? No No No No   Depression Screen PHQ 2/9 Scores 02/20/2017 08/15/2016 11/01/2014 01/12/2014  PHQ - 2 Score 0 1 0 0  PHQ- 9 Score 0 - - -    Cognitive Function MMSE - Mini Mental State Exam 02/20/2017 11/01/2014  Orientation to time 5 5  Orientation to Place 5 5  Registration 3 3  Attention/ Calculation 5 5  Recall 3 3  Language- name 2 objects 2 2  Language- repeat 1 1  Language- follow 3 step command 3 3  Language- read & follow direction 1 1  Write a sentence 1 1  Copy  design 1 1  Total score 30 30        Immunization History  Administered Date(s) Administered  . Influenza Split 04/15/2013  . Pneumococcal Conjugate-13 07/15/2013  . Pneumococcal Polysaccharide-23 01/15/2012  . Tdap 03/06/2007   Screening Tests Health Maintenance  Topic Date Due  . PNA vac Low Risk Adult (2 of 2 - PPSV23) 01/14/2017  . TETANUS/TDAP  03/05/2017  . FOOT EXAM  05/02/2017  . HEMOGLOBIN A1C  06/13/2017  . OPHTHALMOLOGY EXAM  12/04/2017  . COLONOSCOPY  04/25/2021  . Hepatitis C Screening  Completed  . INFLUENZA VACCINE  Excluded      Plan:    End of life planning; Advance aging; Advanced directives discussed. Copy of current  HCPOA/Living Will requested.    I have personally reviewed and noted the following in the patient's chart:   . Medical and social history . Use of alcohol, tobacco or illicit drugs  . Current medications and supplements . Functional ability and status . Nutritional status . Physical activity . Advanced directives . List of other physicians . Hospitalizations, surgeries, and ER visits in previous 12 months . Vitals . Screenings to include cognitive, depression, and falls . Referrals and appointments  In addition, I have reviewed and discussed with patient certain preventive protocols, quality metrics, and best practice recommendations. A written personalized care plan for preventive services as well as general preventive health recommendations were provided to patient.     OBrien-Blaney, Jerimiah Wolman L, LPN  9/93/7169    I have reviewed the above information and agree with above.   Deborra Medina, MD

## 2017-03-15 ENCOUNTER — Other Ambulatory Visit: Payer: Self-pay | Admitting: Internal Medicine

## 2017-03-18 ENCOUNTER — Other Ambulatory Visit: Payer: Self-pay | Admitting: Internal Medicine

## 2017-03-20 NOTE — Telephone Encounter (Signed)
Error

## 2017-03-29 ENCOUNTER — Other Ambulatory Visit: Payer: Self-pay | Admitting: Internal Medicine

## 2017-04-21 ENCOUNTER — Other Ambulatory Visit: Payer: Self-pay | Admitting: Internal Medicine

## 2017-04-30 ENCOUNTER — Other Ambulatory Visit: Payer: Self-pay | Admitting: Internal Medicine

## 2017-05-03 DIAGNOSIS — Z85850 Personal history of malignant neoplasm of thyroid: Secondary | ICD-10-CM | POA: Diagnosis not present

## 2017-05-03 DIAGNOSIS — E890 Postprocedural hypothyroidism: Secondary | ICD-10-CM | POA: Diagnosis not present

## 2017-05-06 ENCOUNTER — Encounter: Payer: Self-pay | Admitting: Internal Medicine

## 2017-05-06 ENCOUNTER — Ambulatory Visit: Payer: PPO | Admitting: Internal Medicine

## 2017-05-06 VITALS — BP 118/76 | HR 69 | Temp 97.6°F | Resp 15 | Wt 182.1 lb

## 2017-05-06 DIAGNOSIS — J449 Chronic obstructive pulmonary disease, unspecified: Secondary | ICD-10-CM

## 2017-05-06 DIAGNOSIS — N183 Chronic kidney disease, stage 3 unspecified: Secondary | ICD-10-CM

## 2017-05-06 DIAGNOSIS — Z85850 Personal history of malignant neoplasm of thyroid: Secondary | ICD-10-CM | POA: Diagnosis not present

## 2017-05-06 DIAGNOSIS — I255 Ischemic cardiomyopathy: Secondary | ICD-10-CM | POA: Diagnosis not present

## 2017-05-06 DIAGNOSIS — E119 Type 2 diabetes mellitus without complications: Secondary | ICD-10-CM | POA: Diagnosis not present

## 2017-05-06 DIAGNOSIS — Z23 Encounter for immunization: Secondary | ICD-10-CM | POA: Diagnosis not present

## 2017-05-06 DIAGNOSIS — I10 Essential (primary) hypertension: Secondary | ICD-10-CM | POA: Diagnosis not present

## 2017-05-06 DIAGNOSIS — E7800 Pure hypercholesterolemia, unspecified: Secondary | ICD-10-CM | POA: Diagnosis not present

## 2017-05-06 DIAGNOSIS — I2510 Atherosclerotic heart disease of native coronary artery without angina pectoris: Secondary | ICD-10-CM

## 2017-05-06 LAB — COMPREHENSIVE METABOLIC PANEL
ALBUMIN: 4 g/dL (ref 3.5–5.2)
ALK PHOS: 116 U/L (ref 39–117)
ALT: 34 U/L (ref 0–53)
AST: 21 U/L (ref 0–37)
BUN: 26 mg/dL — AB (ref 6–23)
CO2: 28 mEq/L (ref 19–32)
Calcium: 9.8 mg/dL (ref 8.4–10.5)
Chloride: 102 mEq/L (ref 96–112)
Creatinine, Ser: 1.58 mg/dL — ABNORMAL HIGH (ref 0.40–1.50)
GFR: 46.39 mL/min — ABNORMAL LOW (ref >60.00)
GLUCOSE: 122 mg/dL — AB (ref 70–99)
Potassium: 4.8 mEq/L (ref 3.5–5.1)
SODIUM: 138 meq/L (ref 135–145)
TOTAL PROTEIN: 7.7 g/dL (ref 6.0–8.3)
Total Bilirubin: 0.4 mg/dL (ref 0.2–1.2)

## 2017-05-06 LAB — HEMOGLOBIN A1C: HEMOGLOBIN A1C: 7.2 % — AB (ref 4.6–6.5)

## 2017-05-06 LAB — LDL CHOLESTEROL, DIRECT: LDL DIRECT: 84 mg/dL

## 2017-05-06 LAB — TSH: TSH: 0.43 u[IU]/mL (ref 0.35–4.50)

## 2017-05-06 LAB — MICROALBUMIN / CREATININE URINE RATIO
Creatinine,U: 87.4 mg/dL
Microalb Creat Ratio: 4.6 mg/g (ref 0.0–30.0)
Microalb, Ur: 4 mg/dL — ABNORMAL HIGH (ref 0.0–1.9)

## 2017-05-06 MED ORDER — OMEPRAZOLE 40 MG PO CPDR: 40.0000 mg | DELAYED_RELEASE_CAPSULE | Freq: Every day | ORAL | 3 refills | Status: DC

## 2017-05-06 MED ORDER — TETANUS-DIPHTH-ACELL PERTUSSIS 5-2.5-18.5 LF-MCG/0.5 IM SUSP: 0.5000 mL | Freq: Once | INTRAMUSCULAR | 0 refills | Status: AC

## 2017-05-06 NOTE — Assessment & Plan Note (Signed)
Asymptomatic at rest.  encouraged to start a daily walking program

## 2017-05-06 NOTE — Assessment & Plan Note (Signed)
Mild, asymptomatic at rest

## 2017-05-06 NOTE — Progress Notes (Signed)
Subjective:  [name], male    DOB: August 23, 1945  Age: 71 y.o. MRN: [id number]  CC: The primary encounter diagnosis was Coronary artery disease involving native heart without angina pectoris, unspecified vessel or lesion type. Diagnoses of Type 2 diabetes mellitus without complication, without long-term current use of insulin ([name]), History of thyroid cancer, Need for 23-polyvalent pneumococcal polysaccharide vaccine, Ischemic cardiomyopathy, CKD (chronic kidney disease) stage 3, GFR 30-59 ml/min (HCC), COPD, mild ([place]), Pure hypercholesterolemia, and Essential hypertension were also pertinent to this visit.  HPI Nathaniel Lane presents for  6 month follow up on diabetes.  Patient has no complaints today.  Patient is following a low glycemic index diet and taking all prescribed medications regularly without side effects.  Fasting sugars have not been checked very often but have been less than 140 most of the time and post prandials have been under 160 except on rare occasions. Patient is not exercising .   Patient has had an eye exam in the last 12 months and checks feet regularly for signs of infection.  Patient does not walk barefoot outside,  And denies any numbness tingling or burning in feet. Patient is up to date on all recommended vaccinations  History of thyroid CA   No recurrence 4 yrs post treatment  Goal TSH is  0.5 to 2.0  And a 6 month follow up with Dr Nathaniel Lane  Is advised in May 2019 .  Needs TSH  Feeling great except for  Has a post prandial cough that has returned and  lasts about 10 minutes .  Has been present for the past 4 months.  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   Refuses flu vaccine     Outpatient Medications Prior to Visit  Medication  Sig Dispense Refill  . aspirin 81 MG EC tablet TAKE 1 TABLET (81 MG TOTAL) BY MOUTH DAILY. SWALLOW WHOLE. 90 tablet 3  . atorvastatin (LIPITOR) 20 MG tablet TAKE 1 TABLET (20 MG TOTAL) BY MOUTH DAILY. 90 tablet 1  . enalapril (VASOTEC) 5 MG tablet TAKE 1 TABLET (5 MG TOTAL) BY MOUTH DAILY. 90 tablet 2  . gabapentin (NEURONTIN) 100 MG capsule TAKE 1 CAPSULE (100 MG TOTAL) BY MOUTH AT BEDTIME. INCREASE DOSE WEEKLY BY 100 MG (1 CAPSULE) 90 capsule 1  . glipiZIDE (GLUCOTROL) 5 MG tablet TAKE 1/2 TABLET (2.5 MG TOTAL) BY MOUTH 2 (TWO) TIMES DAILY BEFORE A MEAL. 90 tablet 1  . levothyroxine (SYNTHROID, LEVOTHROID) 150 MCG tablet TAKE ONE TABLET BY MOUTH ONCE DAILY BEFORE BREAKFAST 30 tablet 0  . tamsulosin (FLOMAX) 0.4 MG CAPS capsule TAKE ONE CAPSULE BY MOUTH EVERY DAY 90 capsule 1   No facility-administered medications prior to visit.     Review of Systems;  Patient denies headache, fevers, malaise, unintentional weight loss, skin rash, eye pain, sinus congestion and sinus pain, sore throat, dysphagia,  hemoptysis , cough, dyspnea, wheezing, chest pain, palpitations, orthopnea, edema, abdominal pain, nausea, melena, diarrhea, constipation, flank pain, dysuria, hematuria, urinary  Frequency, nocturia, numbness, tingling, seizures,  Focal weakness, Loss of consciousness,  Tremor, insomnia, depression, anxiety, and suicidal ideation.      Objective:  BP 118/76 (BP Location: Left Arm, Patient Position: Sitting,  Cuff Size: Normal)   Pulse 69   Temp 97.6 F (36.4 C) (Oral)   Resp 15   Wt 182 lb 1.9 oz (82.6 kg)   SpO2 97%   BMI 26.13 kg/m   BP Readings from Last 3 Encounters:  05/06/17 118/76  02/20/17 110/70  01/28/17 139/73    Wt Readings from Last 3 Encounters:  05/06/17 182 lb 1.9 oz (82.6 kg)  02/20/17 181 lb 6.4 oz (82.3 kg)  10/31/16 179 lb (81.2 kg)    General appearance: alert, cooperative and appears stated age Ears: normal TM's and external ear canals both ears Throat:  lips, mucosa, and tongue normal; teeth and gums normal Neck: no adenopathy, no carotid bruit, supple, symmetrical, trachea midline and thyroid not enlarged, symmetric, no tenderness/mass/nodules Back: symmetric, no curvature. ROM normal. No CVA tenderness. Lungs: clear to auscultation bilaterally Heart: regular rate and rhythm, S1, S2 normal, no murmur, click, rub or gallop Abdomen: soft, non-tender; bowel sounds normal; no masses,  no organomegaly Pulses: 2+ and symmetric Skin: Skin color, texture, turgor normal. No rashes or lesions Lymph nodes: Cervical, supraclavicular, and axillary nodes normal.  Lab Results  Component Value Date   HGBA1C 7.2 (H) 05/06/2017   HGBA1C 7.3 (H) 12/12/2016   HGBA1C 7.1 (H) 08/09/2016    Lab Results  Component Value Date   CREATININE 1.58 (H) 05/06/2017   CREATININE 1.75 (H) 01/28/2017   CREATININE 1.67 (H) 12/12/2016    Lab Results  Component Value Date   WBC 8.6 08/09/2016   HGB 14.5 08/09/2016   HCT 44.2 08/09/2016   PLT 152.0 08/09/2016   GLUCOSE 122 (H) 05/06/2017   CHOL 148 12/12/2016   TRIG 195.0 (H) 12/12/2016   HDL 29.90 (L) 12/12/2016   LDLDIRECT 84.0 05/06/2017   LDLCALC 79 12/12/2016   ALT 34 05/06/2017   AST 21 05/06/2017   NA 138 05/06/2017   K 4.8 05/06/2017   CL 102 05/06/2017   CREATININE 1.58 (H) 05/06/2017   BUN 26 (H) 05/06/2017   CO2 28 05/06/2017   TSH 0.43 05/06/2017   PSA 1.35 12/12/2016   INR 1.2 09/27/2011   HGBA1C 7.2 (H) 05/06/2017   MICROALBUR 4.0 (H) 05/06/2017    Ct Chest Wo Contrast  Result Date: 01/07/2017 CLINICAL DATA:  Lung cancer, status post right lobectomy and chemotherapy EXAM: CT CHEST WITHOUT CONTRAST TECHNIQUE: Multidetector CT imaging of the chest was performed following the standard protocol without IV contrast. COMPARISON:  01/13/2016 FINDINGS: Cardiovascular: The heart is top-normal in size. No pericardial effusion. Three vessel coronary atherosclerosis. Postsurgical changes related to  prior CABG. No evidence of thoracic aortic aneurysm. Atherosclerotic calcifications of the aortic arch. Right chest port terminates in the lower SVC. Mediastinum/Nodes: 8 mm short axis low right paratracheal node (series 2/ image 55), unchanged, within normal limits. Status post thyroidectomy. Lungs/Pleura: Status post right lower lobectomy. Moderate centrilobular and paraseptal emphysematous changes, upper lobe predominant. Mild scarring at the right lung base.  No focal consolidation. Mild scarring in the posterior left upper lobe (series 3/ image 32). No suspicious pulmonary nodules. Chronic pleural fluid in the right lower hemithorax. No pneumothorax. Upper Abdomen: Visualized upper abdomen is notable for mild cholelithiasis (series 2/ image 153) and a 1.8 cm benign left adrenal adenoma, unchanged. Musculoskeletal: Degenerative changes of the visualized thoracolumbar spine. Median sternotomy. IMPRESSION: Status post right lower lobectomy. No evidence of recurrent or metastatic disease. Status post thyroidectomy. 1.8 cm benign left adrenal adenoma. Cholelithiasis. Aortic Atherosclerosis (ICD10-I70.0) and Emphysema (ICD10-J43.9). Electronically  Signed   By: Julian Hy M.D.   On: 01/07/2017 17:15    Assessment & Plan:   Problem List Items Addressed This Visit    CAD (coronary artery disease) - Primary   Relevant Orders   Comprehensive metabolic panel (Completed)   LDL cholesterol, direct (Completed)   Cardiomyopathy ([place])    Asymptomatic at rest.  encouraged to start a daily walking program       CKD (chronic kidney disease) stage 3, GFR 30-59 ml/min (HCC)    Renal function has been stable on ACE INhibitor,  He is avoiding NSAIDs and nephrotoxic agents.   Lab Results  Component Value Date   CREATININE 1.58 (H) 05/06/2017  . Lab Results  Component Value Date   MICROALBUR 4.0 (H) 05/06/2017              COPD, mild (HCC)    Mild, asymptomatic at rest      Diabetes mellitus  type 2, uncomplicated (HCC)    SLight loss of control with  Reduction in glipizide to 2. 5 mg two times daily, but no need for medication change unless had can produce home readings.   He is taking an ACE I , high potency statin and ASA.  Mild Neuropathy noted ,  Foot and eye exams are up to date.    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 No recurrence , tsh is at goal  Lab Results  Component Value Date   TSH 0.43 05/06/2017         Relevant Orders   TSH (Completed)   Hyperlipidemia    LDL and triglycerides are at goal on current medications. He has no side effects and liver enzymes are normal. No changes today   Lab Results  Component Value Date   CHOL 148 12/12/2016   HDL 29.90 (L) 12/12/2016   LDLCALC 79 12/12/2016   LDLDIRECT 84.0 05/06/2017   TRIG 195.0 (H) 12/12/2016   CHOLHDL 5 12/12/2016   Lab Results  Component Value Date   ALT 34 05/06/2017   AST 21 05/06/2017   ALKPHOS 116 05/06/2017   BILITOT 0.4 05/06/2017         Hypertension    Well controlled on current regimen. Renal function stable, no changes today.  Lab Results  Component Value Date   CREATININE 1.58 (H) 05/06/2017   Lab Results  Component Value Date   NA 138 05/06/2017   K 4.8 05/06/2017   CL 102 05/06/2017   CO2 28 05/06/2017          Other Visit Diagnoses    Need for 23-polyvalent pneumococcal polysaccharide vaccine       Relevant Orders    Pneumococcal polysaccharide vaccine 23-valent greater than or equal to 2yo subcutaneous/IM (Completed)     A total of 25 minutes of face  to face time was spent with patient more than half of which was spent in counselling about the above mentioned conditions  and coordination of care  I am having Redmond G. Dugue start on omeprazole and Tdap. I am also having him maintain his aspirin, atorvastatin, enalapril, tamsulosin, gabapentin, levothyroxine, and glipiZIDE.  Meds ordered this encounter  Medications  . omeprazole (PRILOSEC) 40 MG capsule    Sig: Take 1 capsule (40 mg total) daily by mouth.    Dispense:  30 capsule    Refill:  3  . Tdap (BOOSTRIX) 5-2.5-18.5 LF-MCG/0.5 injection    Sig: Inject 0.5 mLs once for 1 dose into the muscle.    Dispense:  0.5 mL    Refill:  0    There are no discontinued medications.  Follow-up: Return in about 6 months (around 11/03/2017) for follow up diabetes.   Crecencio Mc, MD

## 2017-05-06 NOTE — Patient Instructions (Signed)
You received the final Pneumonia vaccine today.  Please reconsider the flu vaccine our still need your tetanus-diptheria-pertussis vaccine (TDaP) but you can get it for less $$$ at a local pharmacy with the script I have provided you.   See you in 6 months, sooner if you A1c is > 7.0  DTaP Vaccine (Diphtheria, Tetanus, and Pertussis): What You Need to Know 1. Why get vaccinated? Diphtheria, tetanus, and pertussis are serious diseases caused by bacteria. Diphtheria and pertussis are spread from person to person. Tetanus enters the body through cuts or wounds. DIPHTHERIA causes a thick covering in the back of the throat.  It can lead to breathing problems, paralysis, heart failure, and even death.  TETANUS (Lockjaw) causes painful tightening of the muscles, usually all over the body.  It can lead to "locking" of the jaw so the victim cannot open his mouth or swallow. Tetanus leads to death in up to 2 out of 10 cases.  PERTUSSIS (Whooping Cough) causes coughing spells so bad that it is hard for infants to eat, drink, or breathe. These spells can last for weeks.  It can lead to pneumonia, seizures (jerking and staring spells), brain damage, and death.  Diphtheria, tetanus, and pertussis vaccine (DTaP) can help prevent these diseases. Most children who are vaccinated with DTaP will be protected throughout childhood. Many more children would get these diseases if we stopped vaccinating. DTaP is a safer version of an older vaccine called DTP. DTP is no longer used in the [place]. 2. Who should get DTaP vaccine and when? Children should get 5 doses of DTaP vaccine, one dose at each of the following ages:  2 months  4 months  6 months  15-18 months  4-6 years  DTaP may be given at the same time as other vaccines. 3. Some children should not get DTaP vaccine or should wait  Children with minor illnesses, such as a cold, may be vaccinated. But children who are moderately or severely  ill should usually wait until they recover before getting DTaP vaccine.  Any child who had a life-threatening allergic reaction after a dose of DTaP should not get another dose.  Any child who suffered a brain or nervous system disease within 7 days after a dose of DTaP should not get another dose.  Talk with your doctor if your child: ? had a seizure or collapsed after a dose of DTaP, ? cried non-stop for 3 hours or more after a dose of DTaP, ? had a fever over 105F after a dose of DTaP. Ask your doctor for more information. Some of these children should not get another dose of pertussis vaccine, but may get a vaccine without pertussis, called DT. 4. Older children and adults DTaP is not licensed for adolescents, adults, or children 66 years of age and older. But older people still need protection. A vaccine called Tdap is similar to DTaP. A single dose of Tdap is recommended for people 11 through 69 years of age. Another vaccine, called Td, protects against tetanus and diphtheria, but not pertussis. It is recommended every 10 years. There are separate Vaccine Information Statements for these vaccines. 5. What are the risks from DTaP vaccine? Getting diphtheria, tetanus, or pertussis disease is much riskier than getting DTaP vaccine. However, a vaccine, like any medicine, is capable of causing serious problems, such as severe allergic reactions. The risk of DTaP vaccine causing serious harm, or death, is extremely small. Mild problems (common)  Fever (up to  about 1 child in 4)  Redness or swelling where the shot was given (up to about 1 child in 4)  Soreness or tenderness where the shot was given (up to about 1 child in 4) These problems occur more often after the 4th and 5th doses of the DTaP series than after earlier doses. Sometimes the 4th or 5th dose of DTaP vaccine is followed by swelling of the entire arm or leg in which the shot was given, lasting 1-7 days (up to about 1 child in  74). Other mild problems include:  Fussiness (up to about 1 child in 3)  Tiredness or poor appetite (up to about 1 child in 10)  Vomiting (up to about 1 child in 21) These problems generally occur 1-3 days after the shot. Moderate problems (uncommon)  Seizure (jerking or staring) (about 1 child out of 14,000)  Non-stop crying, for 3 hours or more (up to about 1 child out of 1,000)  High fever, over 105F (about 1 child out of 16,000) Severe problems (very rare)  Serious allergic reaction (less than 1 out of a million doses)  Several other severe problems have been reported after DTaP vaccine. These include: ? Long-term seizures, coma, or lowered consciousness ? Permanent brain damage. These are so rare it is hard to tell if they are caused by the vaccine. Controlling fever is especially important for children who have had seizures, for any reason. It is also important if another family member has had seizures. You can reduce fever and pain by giving your child an aspirin-free pain reliever when the shot is given, and for the next 24 hours, following the package instructions. 6. What if there is a serious reaction? What should I look for? Look for anything that concerns you, such as signs of a severe allergic reaction, very high fever, or behavior changes. Signs of a severe allergic reaction can include hives, swelling of the face and throat, difficulty breathing, a fast heartbeat, dizziness, and weakness. These would start a few minutes to a few hours after the vaccination. What should I do?  If you think it is a severe allergic reaction or other emergency that can't wait, call 9-1-1 or get the person to the nearest hospital. Otherwise, call your doctor.  Afterward, the reaction should be reported to the Vaccine Adverse Event Reporting System (VAERS). Your doctor might file this report, or you can do it yourself through the VAERS web site at www.vaers.SamedayNews.es, or by calling  320-140-5562. ? VAERS is only for reporting reactions. They do not give medical advice. 7. The National Vaccine Injury Compensation Program The [employer] Vaccine Injury Compensation Program (VICP) is a federal program that was created to compensate people who may have been injured by certain vaccines. Persons who believe they may have been injured by a vaccine can learn about the program and about filing a claim by calling 914 705 9192 or visiting the [place] website at GoldCloset.com.ee. 8. How can I learn more?  Ask your doctor.  Call your local or state health department.  Contact the Centers for Disease Control and Prevention (CDC): ? Call 218 365 1964 (1-800-CDC-INFO) or ? Visit CDC's website at http://hunter.com/ CDC DTaP Vaccine (Diphtheria, Tetanus, and Pertussis) VIS (11/08/05) This information is not intended to replace advice given to you by your health care provider. Make sure you discuss any questions you have with your health care provider. Document Released: 04/08/2006 Document Revised: 03/01/2016 Document Reviewed: 03/01/2016 Elsevier Interactive Patient Education  2017 [employer].

## 2017-05-06 NOTE — Assessment & Plan Note (Signed)
LDL and triglycerides are at goal on current medications. He has no side effects and liver enzymes are normal. No changes today   Lab Results  Component Value Date   CHOL 148 12/12/2016   HDL 29.90 (L) 12/12/2016   LDLCALC 79 12/12/2016   LDLDIRECT 84.0 05/06/2017   TRIG 195.0 (H) 12/12/2016   CHOLHDL 5 12/12/2016   Lab Results  Component Value Date   ALT 34 05/06/2017   AST 21 05/06/2017   ALKPHOS 116 05/06/2017   BILITOT 0.4 05/06/2017

## 2017-05-06 NOTE — Assessment & Plan Note (Signed)
SLight loss of control with  Reduction in glipizide to 2. 5 mg two times daily, but no need for medication change unless had can produce home readings.   He is taking an ACE I , high potency statin and ASA.  Mild Neuropathy noted ,  Foot and eye exams are up to date.    Lab Results  Component Value Date   HGBA1C 7.2 (H) 05/06/2017   Lab Results  Component Value Date   MICROALBUR 4.0 (H) 05/06/2017   Lab Results  Component Value Date   CHOL 148 12/12/2016   HDL 29.90 (L) 12/12/2016   LDLCALC 79 12/12/2016   LDLDIRECT 84.0 05/06/2017   TRIG 195.0 (H) 12/12/2016   CHOLHDL 5 12/12/2016

## 2017-05-06 NOTE — Assessment & Plan Note (Signed)
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 No recurrence , tsh is at goal  Lab Results  Component Value Date   TSH 0.43 05/06/2017

## 2017-05-06 NOTE — Assessment & Plan Note (Signed)
Well controlled on current regimen. Renal function stable, no changes today.  Lab Results  Component Value Date   CREATININE 1.58 (H) 05/06/2017   Lab Results  Component Value Date   NA 138 05/06/2017   K 4.8 05/06/2017   CL 102 05/06/2017   CO2 28 05/06/2017

## 2017-05-06 NOTE — Assessment & Plan Note (Signed)
Renal function has been stable on ACE INhibitor,  He is avoiding NSAIDs and nephrotoxic agents.   Lab Results  Component Value Date   CREATININE 1.58 (H) 05/06/2017  . Lab Results  Component Value Date   MICROALBUR 4.0 (H) 05/06/2017

## 2017-05-07 ENCOUNTER — Telehealth: Payer: Self-pay | Admitting: Internal Medicine

## 2017-05-07 NOTE — Telephone Encounter (Signed)
Pt called in for lab results.  Diabetes is still under good control on current regimen, and your a1c has dropped slightly compared to last time from 7.3 to 7.2.    Goal is 7.0 or less.   Other labs are normal.   No medication changes were advised at this time, but I would like him to limit the number of complex carbohydrates (starches, including white potatoes, cereals, cookies and cake) in your diet to 2 daily and advise to participate in exercise regularly.   Return in 3 months.   An appt was made with Dr. Derrel Nip for Feb. 14, 2019 at 8:00 for his 3 month follow up.  Pt was agreeable to this plan and appt.

## 2017-05-20 ENCOUNTER — Other Ambulatory Visit: Payer: Self-pay | Admitting: Internal Medicine

## 2017-05-23 ENCOUNTER — Other Ambulatory Visit: Payer: Self-pay | Admitting: Internal Medicine

## 2017-06-07 ENCOUNTER — Other Ambulatory Visit: Payer: Self-pay | Admitting: Internal Medicine

## 2017-06-07 NOTE — Telephone Encounter (Signed)
Refilled: 03/29/2017 Last OV: 05/06/2017 Next OV: 08/08/2017

## 2017-06-15 ENCOUNTER — Other Ambulatory Visit: Payer: Self-pay | Admitting: Internal Medicine

## 2017-08-02 IMAGING — US US CAROTID DUPLEX BILAT
1 series · 13 of 24 positions shown · non-contrast
Comparison: None.

CLINICAL DATA: Hypertension, hyperlipidemia, visual disturbance and
coronary artery disease. Abnormal eye exam.

EXAM:
BILATERAL CAROTID DUPLEX ULTRASOUND
TECHNIQUE: Gray scale imaging, color Doppler and duplex ultrasound were
performed of bilateral carotid and vertebral arteries in the neck.

[Series 1: us carotid duplex bilat · 0.06mm/px · 13 of 66 slices shown]
[im 1/66]
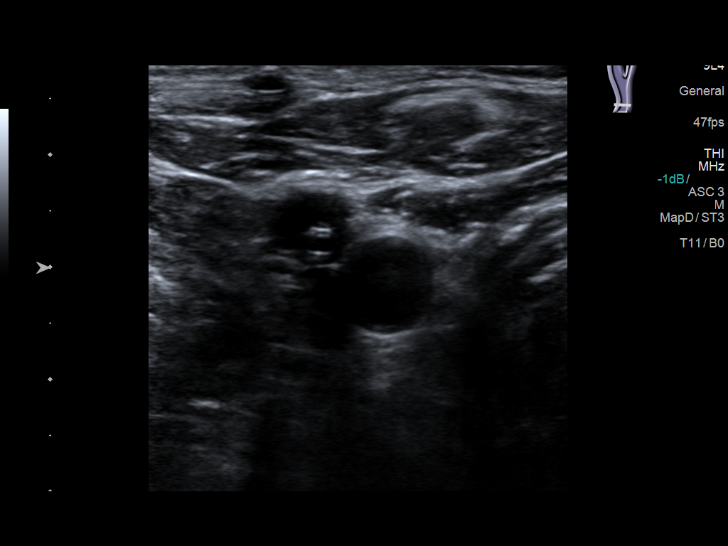
[im 6/66]
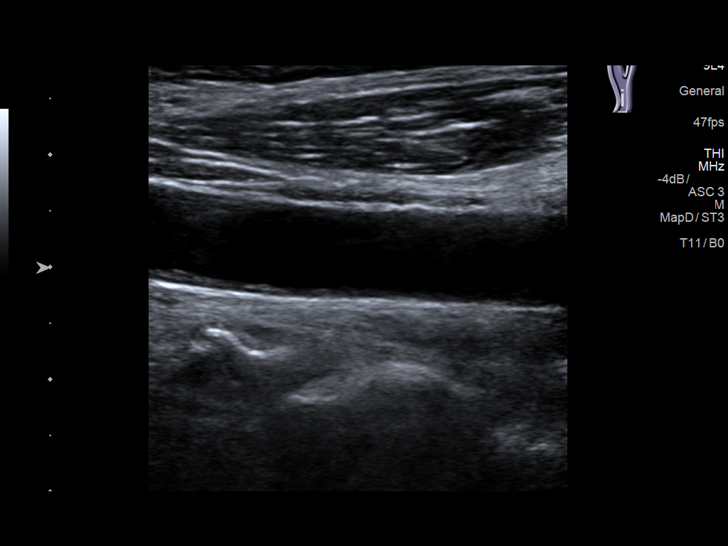
[im 12/66]
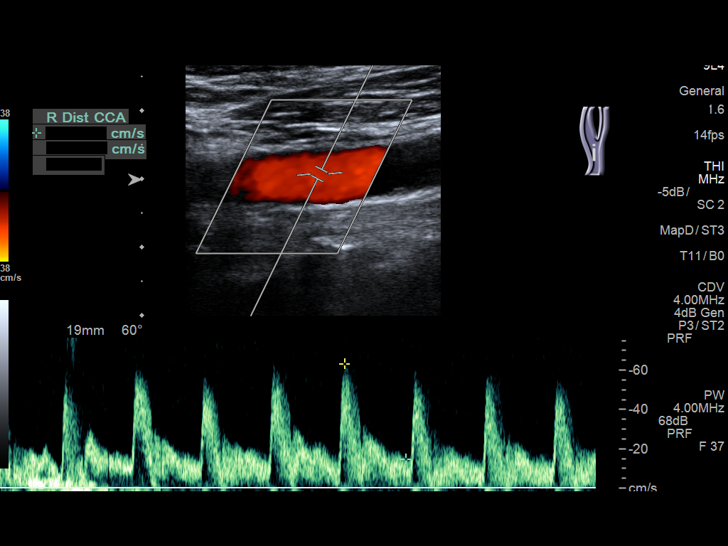
[im 17/66]
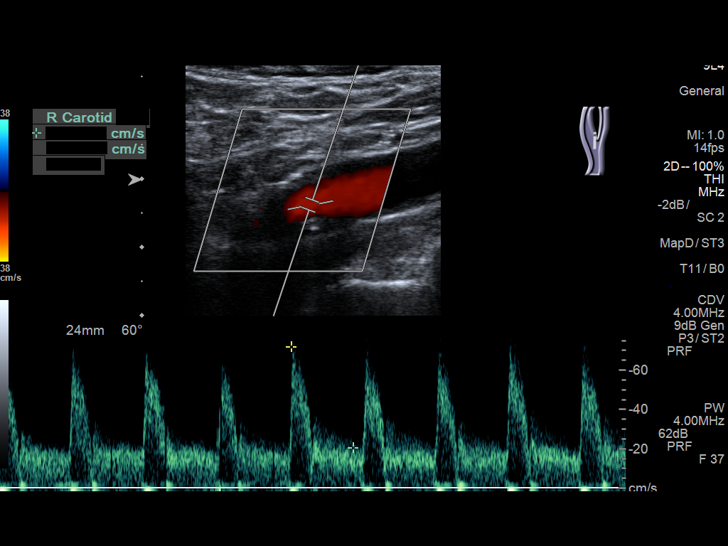
[im 23/66]
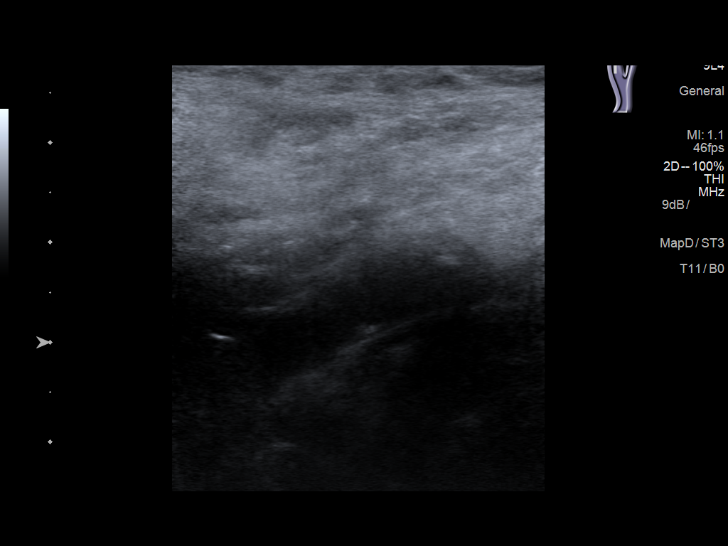
[im 29/66]
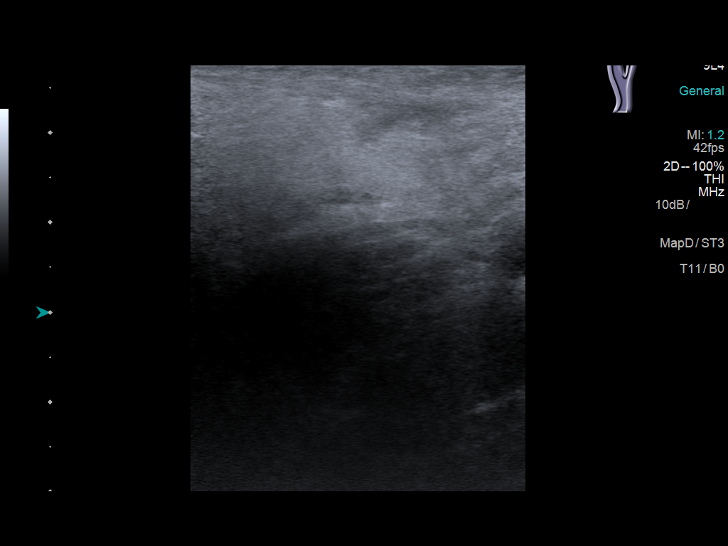
[im 34/66]
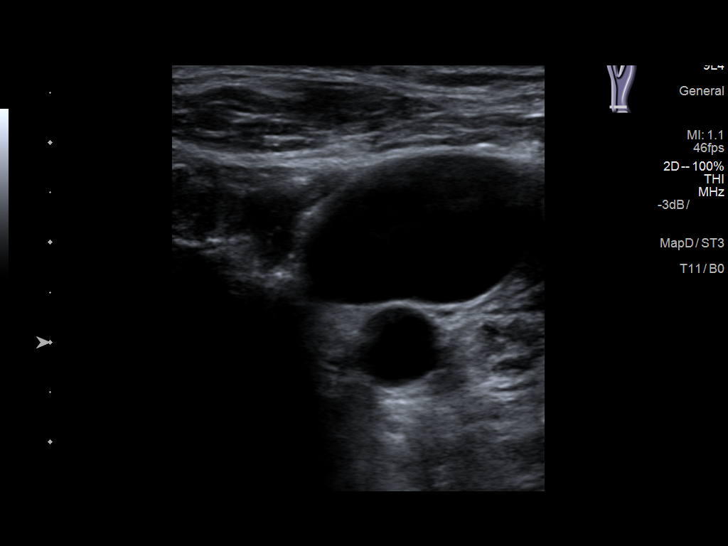
[im 37/66]
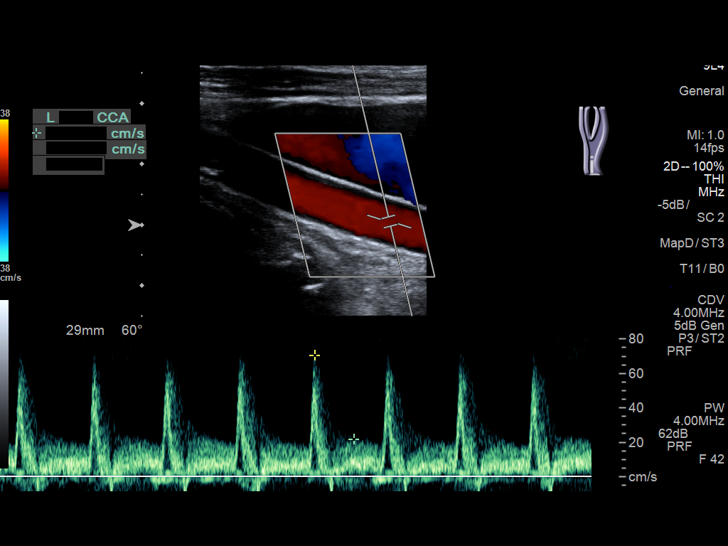
[im 43/66]
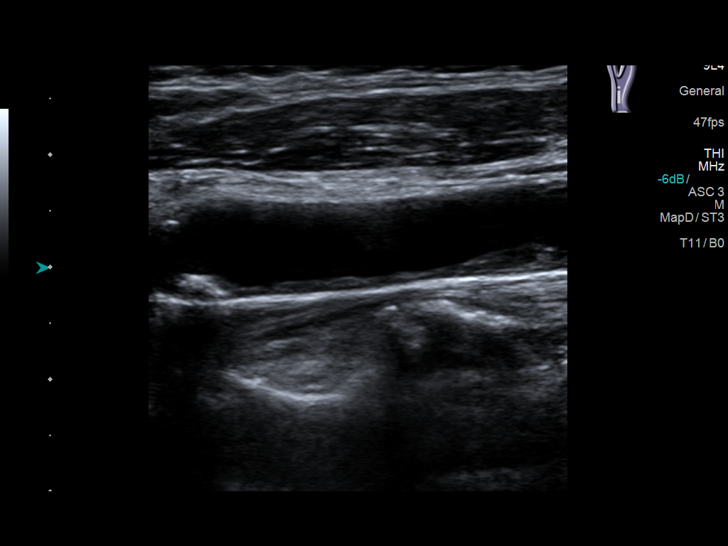
[im 49/66]
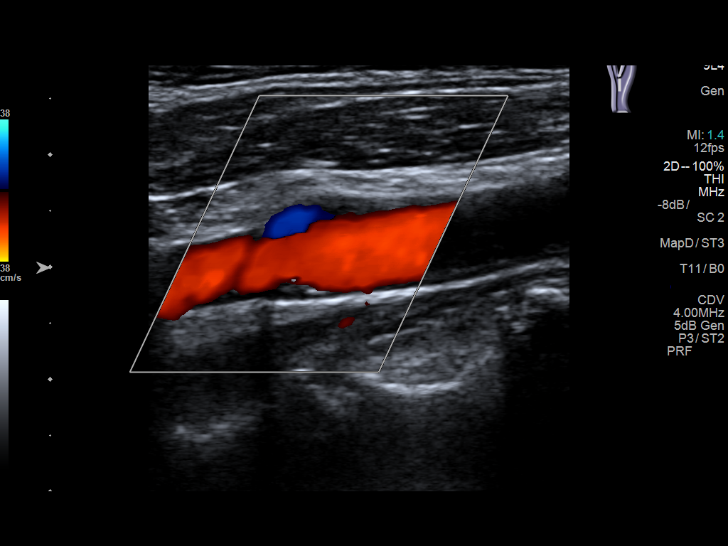
[im 54/66]
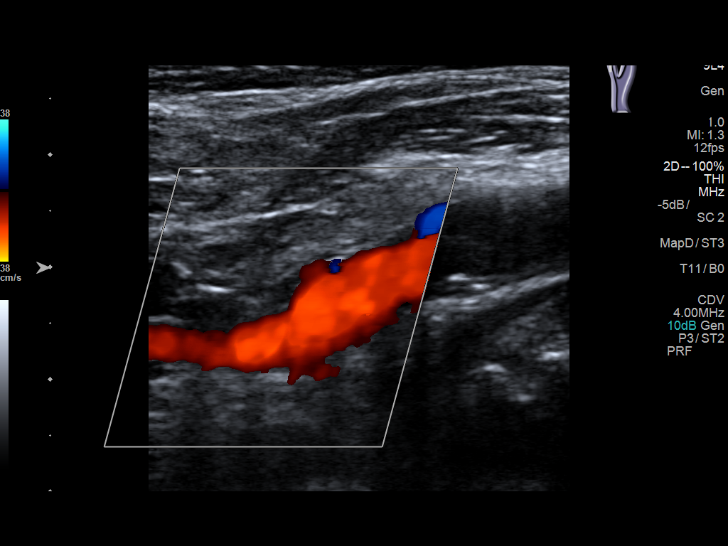
[im 60/66]
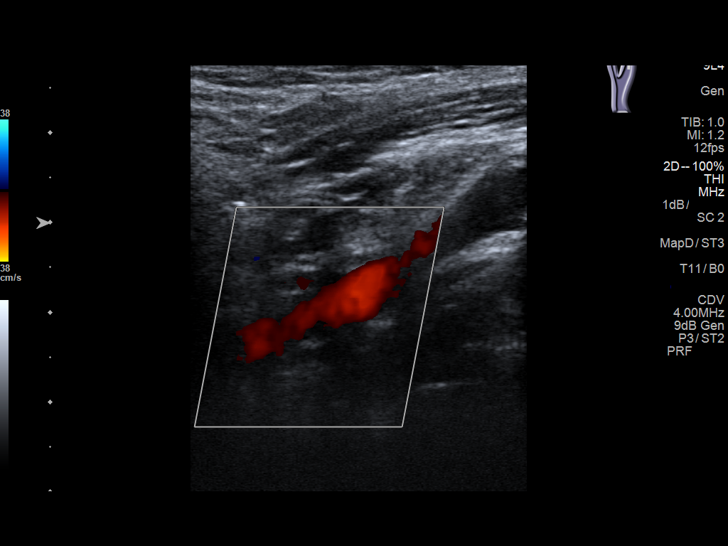
[im 66/66]
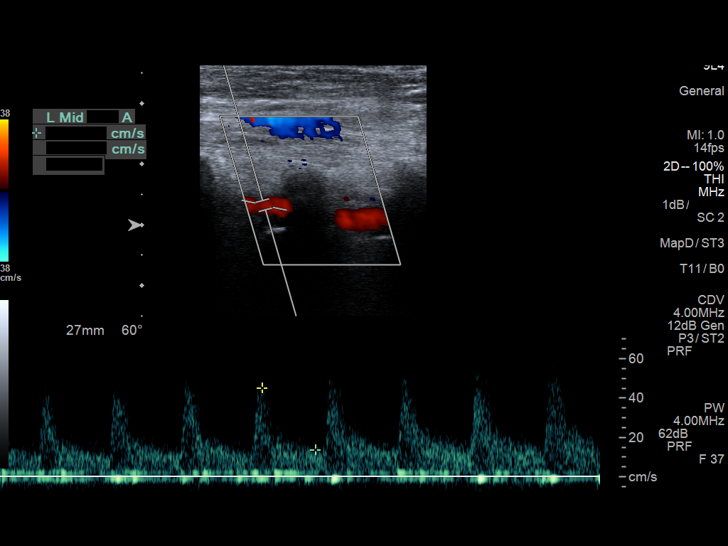

[13 of 24 positions shown; findings below may reference images not displayed]

FINDINGS: Criteria: Quantification of carotid stenosis is based on velocity
parameters that correlate the residual internal carotid diameter
with NASCET-based stenosis levels, using the diameter of the distal
internal carotid lumen as the denominator for stenosis measurement.

The following velocity measurements were obtained:

RIGHT

ICA:  76/29 cm/sec

CCA:  70/15 cm/sec

SYSTOLIC ICA/CCA RATIO:

ECA:  75 cm/sec

LEFT

ICA:  59/18 cm/sec

CCA:  111/26 cm/sec

SYSTOLIC ICA/CCA RATIO:

ECA:  77 cm/sec

RIGHT CAROTID ARTERY: There is a mild amount of partially calcified
plaque at the level of the right carotid bulb. No evidence of ICA
plaque or stenosis.

RIGHT VERTEBRAL ARTERY: Antegrade flow with normal waveform and
velocity.

LEFT CAROTID ARTERY: Mild amount of partially calcified plaque is
present at the level of the common carotid artery common carotid
bulb and proximal left ICA. Estimated left ICA stenosis is less than
50%.

LEFT VERTEBRAL ARTERY: Antegrade flow with normal waveform and
velocity.
IMPRESSION: Mild amount of plaque at the level of both carotid bulbs and the
proximal left ICA. There is no evidence of right ICA plaque or
stenosis. Estimated left ICA stenosis is less than 50%.

## 2017-08-08 ENCOUNTER — Ambulatory Visit (INDEPENDENT_AMBULATORY_CARE_PROVIDER_SITE_OTHER): Payer: PPO | Admitting: Internal Medicine

## 2017-08-08 ENCOUNTER — Encounter: Payer: Self-pay | Admitting: Internal Medicine

## 2017-08-08 VITALS — BP 110/62 | HR 77 | Temp 97.6°F | Resp 15 | Ht 70.0 in | Wt 181.4 lb

## 2017-08-08 DIAGNOSIS — I129 Hypertensive chronic kidney disease with stage 1 through stage 4 chronic kidney disease, or unspecified chronic kidney disease: Secondary | ICD-10-CM

## 2017-08-08 DIAGNOSIS — I10 Essential (primary) hypertension: Secondary | ICD-10-CM | POA: Diagnosis not present

## 2017-08-08 DIAGNOSIS — E119 Type 2 diabetes mellitus without complications: Secondary | ICD-10-CM | POA: Diagnosis not present

## 2017-08-08 DIAGNOSIS — E7800 Pure hypercholesterolemia, unspecified: Secondary | ICD-10-CM

## 2017-08-08 DIAGNOSIS — E1122 Type 2 diabetes mellitus with diabetic chronic kidney disease: Secondary | ICD-10-CM | POA: Diagnosis not present

## 2017-08-08 DIAGNOSIS — IMO0001 Reserved for inherently not codable concepts without codable children: Secondary | ICD-10-CM

## 2017-08-08 LAB — POCT GLYCOSYLATED HEMOGLOBIN (HGB A1C): Hemoglobin A1C: 7.4

## 2017-08-08 NOTE — Patient Instructions (Addendum)
Your less frequent BM's may be due to  Drinking less water some days  You can also use Miralax or citrucel  , metamucil ,  (fiber laxatives)  Every night or just as needed.  Do not take these at he same time as your medications because the pills will not be absorbed  Your A1c is rising!  You need to cut back on your  Servings of starch daily  ,  Reduce to 2 servings of bread daily and cut back on Lil Debbies to twice a week .  Choose Cauliflower or cabbage  Over potatoes.    You need to walk for 30 minutes  Every day (best time would be after you eat your big meal )   Return for fasting labs at your leisure.     rtc in 3 months
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
## 2017-08-10 NOTE — Assessment & Plan Note (Signed)
Well controlled on current regimen. Renal function stable, no changes today.  Lab Results  Component Value Date   CREATININE 1.58 (H) 05/06/2017   Lab Results  Component Value Date   NA 138 05/06/2017   K 4.8 05/06/2017   CL 102 05/06/2017   CO2 28 05/06/2017

## 2017-08-10 NOTE — Assessment & Plan Note (Signed)
LDL and triglycerides have been at goal on current medications. He has no side effects and liver enzymes are normal. No changes today . Recheck 3 months   Lab Results  Component Value Date   CHOL 148 12/12/2016   HDL 29.90 (L) 12/12/2016   LDLCALC 79 12/12/2016   LDLDIRECT 84.0 05/06/2017   TRIG 195.0 (H) 12/12/2016   CHOLHDL 5 12/12/2016   Lab Results  Component Value Date   ALT 34 05/06/2017   AST 21 05/06/2017   ALKPHOS 116 05/06/2017   BILITOT 0.4 05/06/2017

## 2017-08-10 NOTE — Assessment & Plan Note (Signed)
Continued  loss of control due to dietary nonadherence to low GI diet.   A total of 25 minutes of face to face time was spent with patient more than half of which was spent in counselling about his diet , no medication changes  Unless repeat in 3 months has not improved .     He is taking an ACE I , high potency statin and ASA.  Mild Neuropathy noted ,  Foot and eye exams are up to date.    Lab Results  Component Value Date   HGBA1C 7.4 08/08/2017   Lab Results  Component Value Date   MICROALBUR 4.0 (H) 05/06/2017   Lab Results  Component Value Date   CHOL 148 12/12/2016   HDL 29.90 (L) 12/12/2016   LDLCALC 79 12/12/2016   LDLDIRECT 84.0 05/06/2017   TRIG 195.0 (H) 12/12/2016   CHOLHDL 5 12/12/2016

## 2017-08-11 ENCOUNTER — Other Ambulatory Visit: Payer: Self-pay | Admitting: Internal Medicine

## 2017-08-19 ENCOUNTER — Other Ambulatory Visit (INDEPENDENT_AMBULATORY_CARE_PROVIDER_SITE_OTHER): Payer: PPO

## 2017-08-19 DIAGNOSIS — K219 Gastro-esophageal reflux disease without esophagitis: Secondary | ICD-10-CM | POA: Diagnosis not present

## 2017-08-19 DIAGNOSIS — I10 Essential (primary) hypertension: Secondary | ICD-10-CM | POA: Diagnosis not present

## 2017-08-19 DIAGNOSIS — I2510 Atherosclerotic heart disease of native coronary artery without angina pectoris: Secondary | ICD-10-CM | POA: Diagnosis not present

## 2017-08-19 DIAGNOSIS — E7800 Pure hypercholesterolemia, unspecified: Secondary | ICD-10-CM | POA: Diagnosis not present

## 2017-08-19 DIAGNOSIS — I5022 Chronic systolic (congestive) heart failure: Secondary | ICD-10-CM | POA: Diagnosis not present

## 2017-08-19 DIAGNOSIS — E7849 Other hyperlipidemia: Secondary | ICD-10-CM | POA: Diagnosis not present

## 2017-08-19 DIAGNOSIS — R0602 Shortness of breath: Secondary | ICD-10-CM | POA: Diagnosis not present

## 2017-08-19 DIAGNOSIS — N182 Chronic kidney disease, stage 2 (mild): Secondary | ICD-10-CM | POA: Diagnosis not present

## 2017-08-19 DIAGNOSIS — Z87891 Personal history of nicotine dependence: Secondary | ICD-10-CM | POA: Diagnosis not present

## 2017-08-19 DIAGNOSIS — E119 Type 2 diabetes mellitus without complications: Secondary | ICD-10-CM | POA: Diagnosis not present

## 2017-08-19 DIAGNOSIS — R002 Palpitations: Secondary | ICD-10-CM | POA: Diagnosis not present

## 2017-08-19 DIAGNOSIS — I25719 Atherosclerosis of autologous vein coronary artery bypass graft(s) with unspecified angina pectoris: Secondary | ICD-10-CM | POA: Diagnosis not present

## 2017-08-19 DIAGNOSIS — C3490 Malignant neoplasm of unspecified part of unspecified bronchus or lung: Secondary | ICD-10-CM | POA: Diagnosis not present

## 2017-08-19 LAB — COMPREHENSIVE METABOLIC PANEL
ALBUMIN: 3.9 g/dL (ref 3.5–5.2)
ALT: 28 U/L (ref 0–53)
AST: 18 U/L (ref 0–37)
Alkaline Phosphatase: 126 U/L — ABNORMAL HIGH (ref 39–117)
BILIRUBIN TOTAL: 0.4 mg/dL (ref 0.2–1.2)
BUN: 29 mg/dL — ABNORMAL HIGH (ref 6–23)
CALCIUM: 9.9 mg/dL (ref 8.4–10.5)
CHLORIDE: 104 meq/L (ref 96–112)
CO2: 27 mEq/L (ref 19–32)
CREATININE: 1.77 mg/dL — AB (ref 0.40–1.50)
GFR: 40.66 mL/min — ABNORMAL LOW (ref >60.00)
Glucose, Bld: 162 mg/dL — ABNORMAL HIGH (ref 70–99)
Potassium: 5 mEq/L (ref 3.5–5.1)
Sodium: 139 mEq/L (ref 135–145)
Total Protein: 7.3 g/dL (ref 6.0–8.3)

## 2017-08-19 LAB — LIPID PANEL
CHOL/HDL RATIO: 5
CHOLESTEROL: 155 mg/dL (ref 0–200)
HDL: 30 mg/dL — ABNORMAL LOW (ref >39.00)
NonHDL: 125.12
TRIGLYCERIDES: 201 mg/dL — AB (ref 0.0–149.0)
VLDL: 40.2 mg/dL — ABNORMAL HIGH (ref 0.0–40.0)

## 2017-08-19 LAB — LDL CHOLESTEROL, DIRECT: Direct LDL: 90 mg/dL

## 2017-08-24 ENCOUNTER — Other Ambulatory Visit: Payer: Self-pay | Admitting: Internal Medicine

## 2017-09-02 ENCOUNTER — Other Ambulatory Visit: Payer: Self-pay | Admitting: Internal Medicine

## 2017-09-06 ENCOUNTER — Other Ambulatory Visit: Payer: Self-pay | Admitting: Internal Medicine

## 2017-10-12 ENCOUNTER — Other Ambulatory Visit: Payer: Self-pay | Admitting: Internal Medicine

## 2017-10-19 ENCOUNTER — Other Ambulatory Visit: Payer: Self-pay | Admitting: Internal Medicine

## 2017-10-28 DIAGNOSIS — E890 Postprocedural hypothyroidism: Secondary | ICD-10-CM | POA: Diagnosis not present

## 2017-11-04 ENCOUNTER — Ambulatory Visit: Payer: PPO | Admitting: Internal Medicine

## 2017-11-04 DIAGNOSIS — E890 Postprocedural hypothyroidism: Secondary | ICD-10-CM | POA: Diagnosis not present

## 2017-11-04 DIAGNOSIS — Z85850 Personal history of malignant neoplasm of thyroid: Secondary | ICD-10-CM | POA: Diagnosis not present

## 2017-11-08 ENCOUNTER — Ambulatory Visit: Payer: PPO | Admitting: Internal Medicine

## 2017-12-03 ENCOUNTER — Other Ambulatory Visit: Payer: Self-pay | Admitting: Internal Medicine

## 2017-12-04 ENCOUNTER — Ambulatory Visit: Payer: PPO | Admitting: Internal Medicine

## 2017-12-19 ENCOUNTER — Other Ambulatory Visit: Payer: PPO

## 2017-12-25 ENCOUNTER — Other Ambulatory Visit: Payer: PPO

## 2017-12-25 ENCOUNTER — Ambulatory Visit: Payer: PPO | Admitting: Internal Medicine

## 2018-01-02 ENCOUNTER — Telehealth: Payer: Self-pay | Admitting: Internal Medicine

## 2018-01-02 DIAGNOSIS — H35011 Changes in retinal vascular appearance, right eye: Secondary | ICD-10-CM | POA: Diagnosis not present

## 2018-01-02 DIAGNOSIS — I739 Peripheral vascular disease, unspecified: Secondary | ICD-10-CM

## 2018-01-02 LAB — HM DIABETES EYE EXAM

## 2018-01-02 NOTE — Telephone Encounter (Signed)
You needed handle this by speaking with a doctor who is physically in the office, because I am out of the [facility] Thursday afternoon and this was a time sensitive message.   And I have no idea what kind of  doppler they are requesting and what it is for

## 2018-01-02 NOTE — Telephone Encounter (Signed)
Copied from [place] [id number]. Topic: Quick Communication - See Telephone Encounter >> Jan 02, 2018  4:09 PM Bea Graff, NT wrote: CRM for notification. See Telephone encounter for: 01/02/18. Amber with [facility] calling to get orders for this to have a doppler. Pt is in office now. Tried calling [facility] line. Please call Amber at [phone]. If unable to call today they are only open until 12pm tomorrow.

## 2018-01-02 NOTE — Telephone Encounter (Signed)
Requesting a doppler, please let me know how to handle this for you. [employer]

## 2018-01-03 NOTE — Telephone Encounter (Signed)
Carotid ultrasound has been ordered to be done at [facility]

## 2018-01-03 NOTE — Telephone Encounter (Signed)
I called Amber at [facility] and she states that Dr. Joya San would like a corotid doppler ordered for [name]. 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 Patient is aware and she states that they will be sending over notes.

## 2018-01-03 NOTE — Telephone Encounter (Signed)
Does this need to be called and scheduled?

## 2018-01-13 ENCOUNTER — Ambulatory Visit
Admission: RE | Admit: 2018-01-13 | Discharge: 2018-01-13 | Disposition: A | Discharge status: Home / Self Care | Payer: PPO | Source: Ambulatory Visit | Attending: Internal Medicine | Admitting: Internal Medicine

## 2018-01-13 DIAGNOSIS — I739 Peripheral vascular disease, unspecified: Secondary | ICD-10-CM | POA: Insufficient documentation

## 2018-01-13 DIAGNOSIS — I6523 Occlusion and stenosis of bilateral carotid arteries: Secondary | ICD-10-CM | POA: Insufficient documentation

## 2018-01-13 DIAGNOSIS — I6522 Occlusion and stenosis of left carotid artery: Secondary | ICD-10-CM | POA: Diagnosis not present

## 2018-01-20 ENCOUNTER — Ambulatory Visit: Payer: PPO | Attending: Oncology

## 2018-01-27 ENCOUNTER — Other Ambulatory Visit: Payer: PPO

## 2018-01-27 ENCOUNTER — Ambulatory Visit: Payer: PPO | Admitting: Oncology

## 2018-01-27 ENCOUNTER — Telehealth: Payer: Self-pay | Admitting: Radiology

## 2018-01-27 DIAGNOSIS — E119 Type 2 diabetes mellitus without complications: Secondary | ICD-10-CM

## 2018-01-27 DIAGNOSIS — I10 Essential (primary) hypertension: Secondary | ICD-10-CM

## 2018-01-27 DIAGNOSIS — E7800 Pure hypercholesterolemia, unspecified: Secondary | ICD-10-CM

## 2018-01-27 DIAGNOSIS — E890 Postprocedural hypothyroidism: Secondary | ICD-10-CM

## 2018-01-27 DIAGNOSIS — E538 Deficiency of other specified B group vitamins: Secondary | ICD-10-CM

## 2018-01-27 DIAGNOSIS — E559 Vitamin D deficiency, unspecified: Secondary | ICD-10-CM

## 2018-01-27 NOTE — Progress Notes (Signed)
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  He currently feels well and is asymptomatic.  He has no neurologic complaints.  He denies any recent fevers or illnesses.  He has a good appetite and denies weight loss.  He has no chest pain, shortness of breath, cough, or hemoptysis.  He has no nausea, vomiting, constipation, or diarrhea.  He has no urinary complaints.  Patient feels at his baseline offers no specific complaints today.  REVIEW OF SYSTEMS:   Review of Systems  Constitutional: Negative.  Negative for fever, malaise/fatigue and weight loss.  Respiratory: Negative.  Negative for cough, hemoptysis and shortness of breath.   Cardiovascular: Negative.  Negative for chest pain and leg swelling.  Gastrointestinal: Negative.  Negative for abdominal pain.  Genitourinary: Negative.  Negative for dysuria.  Musculoskeletal: Negative.  Negative for back pain.  Skin: Negative.  Negative for rash.  Neurological: Negative.  Negative for focal weakness, weakness and headaches.  Psychiatric/Behavioral: Negative.  The patient is not nervous/anxious.     As per HPI. Otherwise, a complete review of systems is negative.  PAST MEDICAL HISTORY: Past Medical History:  Diagnosis Date  . Cancer ([place]) 2012   Stage IIA squamous cell Lung Ca  . Diabetes mellitus   . Gout   . Hyperlipidemia   . Hypertension   . Myocardial infarction ([place])   . Thyroid cancer ([place]) 2013   s/p thyroidectomy 2014    PAST SURGICAL HISTORY: Past Surgical History:  Procedure Laterality Date  . COLONOSCOPY N/A 04/25/2016   Procedure: COLONOSCOPY;  Surgeon: Manya Silvas, MD;  Location:  [facility] ENDOSCOPY;  Service: Endoscopy;  Laterality: N/A;  Diabetic (oral meds)  . CORONARY ARTERY BYPASS GRAFT  2007  . LUNG LOBECTOMY    . TOTAL THYROIDECTOMY N/A     FAMILY HISTORY: Family History  Problem Relation Age of Onset  . Hypertension Other   . Heart disease Father     ADVANCED DIRECTIVES (Y/N):  N  HEALTH MAINTENANCE: Social History   Tobacco Use  . Smoking status: Former Smoker    Last attempt to quit: 07/15/2005    Years since quitting: 12.5  . Smokeless tobacco: Never Used  Substance Use Topics  . Alcohol use: No  . Drug use: No     Colonoscopy:  PAP:  Bone density:  Lipid panel:  Allergies  Allergen Reactions  . Penicillins Swelling    Swelling in mouth    Current Outpatient Medications  Medication Sig Dispense Refill  . aspirin 81 MG EC tablet TAKE 1 TABLET (81 MG TOTAL) BY MOUTH DAILY. SWALLOW WHOLE. 90 tablet 3  . atorvastatin (LIPITOR) 20 MG tablet TAKE 1 TABLET (20 MG TOTAL) BY MOUTH DAILY. 90 tablet 1  . enalapril (VASOTEC) 5 MG tablet TAKE 1 TABLET (5 MG TOTAL) BY MOUTH DAILY. 90 tablet 1  . gabapentin (NEURONTIN) 100 MG capsule TAKE 1 CAPSULE (100 MG TOTAL) BY MOUTH AT BEDTIME. INCREASE DOSE WEEKLY BY 100 MG (1 CAPSULE) 90 capsule 1  . glipiZIDE (GLUCOTROL) 5 MG tablet TAKE 1/2 TABLET (2.5 MG TOTAL) BY MOUTH 2 (TWO) TIMES DAILY BEFORE A MEAL. 90 tablet 1  . levothyroxine (SYNTHROID, LEVOTHROID) 150 MCG tablet  TAKE ONE TABLET BY MOUTH ONCE DAILY BEFORE BREAKFAST 90 tablet 1  . omeprazole (PRILOSEC) 40 MG capsule TAKE 1 CAPSULE (40 MG TOTAL) DAILY BY MOUTH. 90 capsule 1  . tamsulosin (FLOMAX) 0.4 MG CAPS capsule TAKE ONE CAPSULE BY MOUTH EVERY DAY 90 capsule 1  . ergocalciferol (DRISDOL) 50000 units capsule Take 1 capsule (50,000 Units total) by mouth once a week. 12 capsule 0   No current facility-administered medications for this visit.     OBJECTIVE: Vitals:   01/28/18 1147  BP: (!) 159/91  Pulse: 70  Resp: 18  Temp: (!) 96.8 F (36  C)  SpO2: 96%     Body mass index is 26.74 kg/m.    ECOG FS:0 - Asymptomatic  General: Well-developed, well-nourished, no acute distress. Eyes: Pink conjunctiva, anicteric sclera. HEENT: Normocephalic, moist mucous membranes. Lungs: Clear to auscultation bilaterally. Heart: Regular rate and rhythm. No rubs, murmurs, or gallops. Abdomen: Soft, nontender, nondistended. No organomegaly noted, normoactive bowel sounds. Musculoskeletal: No edema, cyanosis, or clubbing. Neuro: Alert, answering all questions appropriately. Cranial nerves grossly intact. Skin: No rashes or petechiae noted. Psych: Normal affect.  LAB RESULTS:  Lab Results  Component Value Date   NA 135 01/28/2018   K 4.9 01/28/2018   CL 103 01/28/2018   CO2 26 01/28/2018   GLUCOSE 199 (H) 01/28/2018   BUN 25 (H) 01/28/2018   CREATININE 1.75 (H) 01/28/2018   CALCIUM 9.4 01/28/2018   PROT 6.9 01/28/2018   ALBUMIN 4.0 01/28/2018   AST 17 01/28/2018   ALT 32 01/28/2018   ALKPHOS 103 01/28/2018   BILITOT 0.5 01/28/2018   GFRNONAA 38 (L) 01/28/2017   GFRAA 44 (L) 01/28/2017    Lab Results  Component Value Date   WBC 8.6 08/09/2016   NEUTROABS 5.4 08/09/2016   HGB 14.5 08/09/2016   HCT 44.2 08/09/2016   MCV 88.7 08/09/2016   PLT 152.0 08/09/2016     STUDIES: US Carotid Duplex Bilateral  Result Date: 01/13/2018 CLINICAL DATA:  Hypertension, hyperlipidemia, visual disturbance and coronary artery disease. Abnormal eye exam. EXAM: BILATERAL CAROTID DUPLEX ULTRASOUND TECHNIQUE: Pearline Cables scale imaging, color Doppler and duplex ultrasound were performed of bilateral carotid and vertebral arteries in the neck. COMPARISON:  None. FINDINGS: Criteria: Quantification of carotid stenosis is based on velocity parameters that correlate the residual internal carotid diameter with NASCET-based stenosis levels, using the diameter of the distal internal carotid lumen as the denominator for stenosis measurement. The following velocity  measurements were obtained: RIGHT ICA:  76/29 cm/sec CCA:  09/32 cm/sec SYSTOLIC ICA/CCA RATIO:  1.1 ECA:  75 cm/sec LEFT ICA:  59/18 cm/sec CCA:  355/73 cm/sec SYSTOLIC ICA/CCA RATIO:  0.5 ECA:  77 cm/sec RIGHT CAROTID ARTERY: There is a mild amount of partially calcified plaque at the level of the right carotid bulb. No evidence of ICA plaque or stenosis. RIGHT VERTEBRAL ARTERY: Antegrade flow with normal waveform and velocity. LEFT CAROTID ARTERY: Mild amount of partially calcified plaque is present at the level of the common carotid artery common carotid bulb and proximal left ICA. Estimated left ICA stenosis is less than 50%. LEFT VERTEBRAL ARTERY: Antegrade flow with normal waveform and velocity. IMPRESSION: Mild amount of plaque at the level of both carotid bulbs and the proximal left ICA. There is no evidence of right ICA plaque or stenosis. Estimated left ICA stenosis is less than 50%. Electronically Signed   By: Aletta Edouard M.D.   On: 01/13/2018 14:47    ASSESSMENT: Stage IIa squamous  cell carcinoma of the lower lobe right lung  PLAN:    1.  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  He completed adjuvant chemotherapy in July 2013.  His most recent CT scan on January 07, 2017 reviewed independently with no obvious evidence of progressive or recurrent disease.  Patient is now 5 years removed from completing his adjuvant treatment.  Will repeat CT scan in 1-2 more weeks.  If there is no evidence of disease, patient can be discharged from clinic. 2.  Thyroid cancer: Unclear stage, have not received any information regarding his treatment.  Continue Synthroid as directed.   3. Renal insufficiency: Patient's creatinine is 1.75 which appears to be approximately his baseline.  Patient expressed understanding and was in agreement with this plan. He also understands that He can call clinic at any time with any questions, concerns, or  complaints.   Cancer Staging No matching staging information was found for the patient.  Lloyd Huger, MD   02/01/2018 6:50 AM

## 2018-01-27 NOTE — Addendum Note (Signed)
Addended by: Crecencio Mc on: 01/27/2018 11:11 AM   Modules accepted: Orders

## 2018-01-27 NOTE — Telephone Encounter (Signed)
Pt coming in for labs tomorrow, please place future orders. Thank you.  

## 2018-01-28 ENCOUNTER — Encounter: Payer: Self-pay | Admitting: Oncology

## 2018-01-28 ENCOUNTER — Inpatient Hospital Stay (HOSPITAL_BASED_OUTPATIENT_CLINIC_OR_DEPARTMENT_OTHER): Payer: PPO | Admitting: Oncology

## 2018-01-28 ENCOUNTER — Other Ambulatory Visit (INDEPENDENT_AMBULATORY_CARE_PROVIDER_SITE_OTHER): Payer: PPO

## 2018-01-28 ENCOUNTER — Inpatient Hospital Stay: Payer: PPO | Attending: Oncology

## 2018-01-28 VITALS — BP 159/91 | HR 70 | Temp 96.8°F | Resp 18 | Wt 186.4 lb

## 2018-01-28 DIAGNOSIS — Z79899 Other long term (current) drug therapy: Secondary | ICD-10-CM

## 2018-01-28 DIAGNOSIS — E538 Deficiency of other specified B group vitamins: Secondary | ICD-10-CM

## 2018-01-28 DIAGNOSIS — E890 Postprocedural hypothyroidism: Secondary | ICD-10-CM | POA: Diagnosis not present

## 2018-01-28 DIAGNOSIS — N289 Disorder of kidney and ureter, unspecified: Secondary | ICD-10-CM

## 2018-01-28 DIAGNOSIS — E7800 Pure hypercholesterolemia, unspecified: Secondary | ICD-10-CM

## 2018-01-28 DIAGNOSIS — E559 Vitamin D deficiency, unspecified: Secondary | ICD-10-CM | POA: Diagnosis not present

## 2018-01-28 DIAGNOSIS — I10 Essential (primary) hypertension: Secondary | ICD-10-CM

## 2018-01-28 DIAGNOSIS — Z9221 Personal history of antineoplastic chemotherapy: Secondary | ICD-10-CM

## 2018-01-28 DIAGNOSIS — Z85118 Personal history of other malignant neoplasm of bronchus and lung: Secondary | ICD-10-CM | POA: Insufficient documentation

## 2018-01-28 DIAGNOSIS — Z902 Acquired absence of lung [part of]: Secondary | ICD-10-CM

## 2018-01-28 DIAGNOSIS — C73 Malignant neoplasm of thyroid gland: Secondary | ICD-10-CM | POA: Insufficient documentation

## 2018-01-28 DIAGNOSIS — E119 Type 2 diabetes mellitus without complications: Secondary | ICD-10-CM | POA: Diagnosis not present

## 2018-01-28 LAB — LIPID PANEL
CHOL/HDL RATIO: 5
Cholesterol: 153 mg/dL (ref 0–200)
HDL: 28.7 mg/dL — AB (ref >39.00)
NonHDL: 124.45
Triglycerides: 295 mg/dL — ABNORMAL HIGH (ref 0.0–149.0)
VLDL: 59 mg/dL — AB (ref 0.0–40.0)

## 2018-01-28 LAB — TSH: TSH: 1.85 u[IU]/mL (ref 0.35–4.50)

## 2018-01-28 LAB — COMPREHENSIVE METABOLIC PANEL
ALBUMIN: 4 g/dL (ref 3.5–5.2)
ALT: 32 U/L (ref 0–53)
AST: 17 U/L (ref 0–37)
Alkaline Phosphatase: 103 U/L (ref 39–117)
BILIRUBIN TOTAL: 0.5 mg/dL (ref 0.2–1.2)
BUN: 25 mg/dL — ABNORMAL HIGH (ref 6–23)
CALCIUM: 9.4 mg/dL (ref 8.4–10.5)
CO2: 26 meq/L (ref 19–32)
CREATININE: 1.75 mg/dL — AB (ref 0.40–1.50)
Chloride: 103 mEq/L (ref 96–112)
GFR: 41.14 mL/min — AB (ref >60.00)
Glucose, Bld: 199 mg/dL — ABNORMAL HIGH (ref 70–99)
Potassium: 4.9 mEq/L (ref 3.5–5.1)
Sodium: 135 mEq/L (ref 135–145)
Total Protein: 6.9 g/dL (ref 6.0–8.3)

## 2018-01-28 LAB — LDL CHOLESTEROL, DIRECT: LDL DIRECT: 82 mg/dL

## 2018-01-28 LAB — MICROALBUMIN / CREATININE URINE RATIO
CREATININE, U: 78.1 mg/dL
Microalb Creat Ratio: 9.2 mg/g (ref 0.0–30.0)
Microalb, Ur: 7.2 mg/dL — ABNORMAL HIGH (ref 0.0–1.9)

## 2018-01-28 LAB — VITAMIN D 25 HYDROXY (VIT D DEFICIENCY, FRACTURES): VITD: 17.29 ng/mL — ABNORMAL LOW (ref 30.00–100.00)

## 2018-01-28 LAB — VITAMIN B12: Vitamin B-12: 259 pg/mL (ref 211–911)

## 2018-01-28 LAB — HEMOGLOBIN A1C: HEMOGLOBIN A1C: 8.5 % — AB (ref 4.6–6.5)

## 2018-01-28 NOTE — Progress Notes (Signed)
Pt in for yearly follow up, denies any difficulties or concerns.

## 2018-01-30 ENCOUNTER — Encounter: Payer: Self-pay | Admitting: Internal Medicine

## 2018-01-30 ENCOUNTER — Other Ambulatory Visit: Payer: Self-pay | Admitting: Internal Medicine

## 2018-01-30 DIAGNOSIS — E538 Deficiency of other specified B group vitamins: Secondary | ICD-10-CM | POA: Insufficient documentation

## 2018-01-30 MED ORDER — ERGOCALCIFEROL 1.25 MG (50000 UT) PO CAPS: 50000.0000 [IU] | ORAL_CAPSULE | ORAL | 0 refills | Status: DC

## 2018-02-05 ENCOUNTER — Encounter: Payer: Self-pay | Admitting: Internal Medicine

## 2018-02-05 ENCOUNTER — Ambulatory Visit (INDEPENDENT_AMBULATORY_CARE_PROVIDER_SITE_OTHER): Payer: PPO | Admitting: Internal Medicine

## 2018-02-05 VITALS — BP 120/78 | HR 65 | Temp 97.5°F | Resp 15 | Ht 70.0 in | Wt 186.8 lb

## 2018-02-05 DIAGNOSIS — E119 Type 2 diabetes mellitus without complications: Secondary | ICD-10-CM

## 2018-02-05 DIAGNOSIS — Z85850 Personal history of malignant neoplasm of thyroid: Secondary | ICD-10-CM

## 2018-02-05 MED ORDER — GLIPIZIDE ER 5 MG PO TB24: 5.0000 mg | ORAL_TABLET | Freq: Every day | ORAL | 0 refills | Status: DC

## 2018-02-05 MED ORDER — LEVOTHYROXINE SODIUM 150 MCG PO TABS: ORAL_TABLET | ORAL | 1 refills | Status: DC

## 2018-02-05 MED ORDER — GABAPENTIN 100 MG PO CAPS: ORAL_CAPSULE | ORAL | 5 refills | Status: DC

## 2018-02-05 MED ORDER — TAMSULOSIN HCL 0.4 MG PO CAPS: 0.4000 mg | ORAL_CAPSULE | Freq: Every day | ORAL | 1 refills | Status: DC

## 2018-02-05 MED ORDER — OMEPRAZOLE 40 MG PO CPDR: 40.0000 mg | DELAYED_RELEASE_CAPSULE | Freq: Every day | ORAL | 1 refills | Status: DC

## 2018-02-05 MED ORDER — ATORVASTATIN CALCIUM 20 MG PO TABS: 20.0000 mg | ORAL_TABLET | Freq: Every day | ORAL | 1 refills | Status: DC

## 2018-02-05 NOTE — Patient Instructions (Addendum)
I am changing your glipizide pill to the XL  (extended releaes) form /  Take 5 mg  Daily in the morning.  Send me more readings in 2 weeks  Continue to check the in the early morning and after lunch  Or dinner   I am increasing your dose  gabapentin so that you can take one in the morning,  One in the afternoon and continue 2 at night

## 2018-02-05 NOTE — Progress Notes (Signed)
Subjective:  [name], male    DOB: Apr 02, 1946  Age: 72 y.o. MRN: [id number]  CC: The primary encounter diagnosis was Type 2 diabetes mellitus without complication, without long-term current use of insulin ([place]). A diagnosis of History of thyroid cancer was also pertinent to this visit.  HPI BUCK MCAFFEE presents for follow up on diabetes.  Hypertension and hypothyroidism   .  Patient has no complaints today.  Patient is following a low glycemic index diet about 505of the time and taking all prescribed medications regularly without side effects.  Fasting sugars have been over 170 but under 210  and post prandials have been up to 200 to 270 . Patient is not exercising or  intentionally trying to lose weight .  Patient has had an eye exam in the last 12 months and checks feet regularly for signs of infection.  Patient does not walk barefoot outside,  And denies an numbness tingling or burning in feet. Patient is up to date on all recommended vaccinations  Diet reviewed:  Prior to last lab appt he Was eating potatoes and bread daily with eggs, , has reduced bread intake cutting out potatoes.  Has been eating n cafe's a  Lot due to travel  related to work  Has changed diet since Friday ,  No bread,    Taking 200 mg gabapentin at night.  Having morning symptoms.    Outpatient Medications Prior to Visit  Medication Sig Dispense Refill  . aspirin 81 MG EC tablet TAKE 1 TABLET (81 MG TOTAL) BY MOUTH DAILY. SWALLOW WHOLE. 90 tablet 3  . enalapril (VASOTEC) 5 MG tablet TAKE 1 TABLET (5 MG TOTAL) BY MOUTH DAILY. 90 tablet 1  . ergocalciferol (DRISDOL) 50000 units capsule Take 1 capsule (50,000 Units total) by mouth once a week. 12 capsule 0  . atorvastatin (LIPITOR) 20 MG tablet TAKE 1 TABLET (20 MG TOTAL) BY MOUTH DAILY. 90 tablet 1  . gabapentin (NEURONTIN) 100 MG capsule TAKE 1 CAPSULE (100 MG TOTAL) BY MOUTH AT BEDTIME. INCREASE DOSE WEEKLY BY 100 MG (1 CAPSULE) 90 capsule 1  .  glipiZIDE (GLUCOTROL) 5 MG tablet TAKE 1/2 TABLET (2.5 MG TOTAL) BY MOUTH 2 (TWO) TIMES DAILY BEFORE A MEAL. 90 tablet 1  . glucose blood (ONE TOUCH ULTRA TEST) test strip     . levothyroxine (SYNTHROID, LEVOTHROID) 150 MCG tablet TAKE ONE TABLET BY MOUTH ONCE DAILY BEFORE BREAKFAST 90 tablet 1  . omeprazole (PRILOSEC) 40 MG capsule TAKE 1 CAPSULE (40 MG TOTAL) DAILY BY MOUTH. 90 capsule 1  . tamsulosin (FLOMAX) 0.4 MG CAPS capsule TAKE ONE CAPSULE BY MOUTH EVERY DAY 90 capsule 1   No facility-administered medications prior to visit.     Review of Systems;  Patient denies headache, fevers, malaise, unintentional weight loss, skin rash, eye pain, sinus congestion and sinus pain, sore throat, dysphagia,  hemoptysis , cough, dyspnea, wheezing, chest pain, palpitations, orthopnea, edema, abdominal pain, nausea, melena, diarrhea, constipation, flank pain, dysuria, hematuria, urinary  Frequency, nocturia, numbness, tingling, seizures,  Focal weakness, Loss of consciousness,  Tremor, insomnia, depression, anxiety, and suicidal ideation.      Objective:  BP 120/78 (BP Location: Left Arm, Patient Position: Sitting, Cuff Size: Normal)   Pulse 65   Temp (!) 97.5 F (36.4 C) (Oral)   Resp 15   Ht 5\' 10"  (1.778 m)   Wt 186 lb 12.8 oz (84.7 kg)   SpO2 97%   BMI 26.80 kg/m  BP Readings from Last 3 Encounters:  02/05/18 120/78  01/28/18 (!) 159/91  08/08/17 110/62    Wt Readings from Last 3 Encounters:  02/05/18 186 lb 12.8 oz (84.7 kg)  01/28/18 186 lb 6 oz (84.5 kg)  08/08/17 181 lb 6.4 oz (82.3 kg)    General appearance: alert, cooperative and appears stated age Ears: normal TM's and external ear canals both ears Throat: lips, mucosa, and tongue normal; teeth and gums normal Neck: no adenopathy, no carotid bruit, supple, symmetrical, trachea midline and thyroid not enlarged, symmetric, no tenderness/mass/nodules Back: symmetric, no curvature. ROM normal. No CVA tenderness. Lungs:  clear to auscultation bilaterally Heart: regular rate and rhythm, S1, S2 normal, no murmur, click, rub or gallop Abdomen: soft, non-tender; bowel sounds normal; no masses,  no organomegaly Pulses: 2+ and symmetric Skin: Skin color, texture, turgor normal. No rashes or lesions Lymph nodes: Cervical, supraclavicular, and axillary nodes normal.  Lab Results  Component Value Date   HGBA1C 8.5 (H) 01/28/2018   HGBA1C 7.4 08/08/2017   HGBA1C 7.2 (H) 05/06/2017    Lab Results  Component Value Date   CREATININE 1.75 (H) 01/28/2018   CREATININE 1.77 (H) 08/19/2017   CREATININE 1.58 (H) 05/06/2017    Lab Results  Component Value Date   WBC 8.6 08/09/2016   HGB 14.5 08/09/2016   HCT 44.2 08/09/2016   PLT 152.0 08/09/2016   GLUCOSE 199 (H) 01/28/2018   CHOL 153 01/28/2018   TRIG 295.0 (H) 01/28/2018   HDL 28.70 (L) 01/28/2018   LDLDIRECT 82.0 01/28/2018   LDLCALC 79 12/12/2016   ALT 32 01/28/2018   AST 17 01/28/2018   NA 135 01/28/2018   K 4.9 01/28/2018   CL 103 01/28/2018   CREATININE 1.75 (H) 01/28/2018   BUN 25 (H) 01/28/2018   CO2 26 01/28/2018   TSH 1.85 01/28/2018   PSA 1.35 12/12/2016   INR 1.2 09/27/2011   HGBA1C 8.5 (H) 01/28/2018   MICROALBUR 7.2 (H) 01/28/2018    US Carotid Duplex Bilateral  Result Date: 01/13/2018 CLINICAL DATA:  Hypertension, hyperlipidemia, visual disturbance and coronary artery disease. Abnormal eye exam. EXAM: BILATERAL CAROTID DUPLEX ULTRASOUND TECHNIQUE: Pearline Cables scale imaging, color Doppler and duplex ultrasound were performed of bilateral carotid and vertebral arteries in the neck. COMPARISON:  None. FINDINGS: Criteria: Quantification of carotid stenosis is based on velocity parameters that correlate the residual internal carotid diameter with NASCET-based stenosis levels, using the diameter of the distal internal carotid lumen as the denominator for stenosis measurement. The following velocity measurements were obtained: RIGHT ICA:  76/29  cm/sec CCA:  22/97 cm/sec SYSTOLIC ICA/CCA RATIO:  1.1 ECA:  75 cm/sec LEFT ICA:  59/18 cm/sec CCA:  989/21 cm/sec SYSTOLIC ICA/CCA RATIO:  0.5 ECA:  77 cm/sec RIGHT CAROTID ARTERY: There is a mild amount of partially calcified plaque at the level of the right carotid bulb. No evidence of ICA plaque or stenosis. RIGHT VERTEBRAL ARTERY: Antegrade flow with normal waveform and velocity. LEFT CAROTID ARTERY: Mild amount of partially calcified plaque is present at the level of the common carotid artery common carotid bulb and proximal left ICA. Estimated left ICA stenosis is less than 50%. LEFT VERTEBRAL ARTERY: Antegrade flow with normal waveform and velocity. IMPRESSION: Mild amount of plaque at the level of both carotid bulbs and the proximal left ICA. There is no evidence of right ICA plaque or stenosis. Estimated left ICA stenosis is less than 50%. Electronically Signed   By: Jenness Corner.D.  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 No recurrence , tsh is at goal  Lab Results  Component Value Date   TSH 1.85 01/28/2018         Diabetes mellitus type 2, uncomplicated ([place]) - Primary    Not at goal due to dietary non adherence until recently .  Changing glipizide to XL 5 mg start.  Review BS in 2 weeks, for medication changes . Continue asa,  Enalapril and lipitor   Lab Results  Component Value Date   HGBA1C 8.5 (H) 01/28/2018   Lab Results  Component Value Date   MICROALBUR 7.2 (H) 01/28/2018        Relevant Medications   glipiZIDE (GLUCOTROL XL) 5 MG 24 hr tablet   atorvastatin (LIPITOR) 20 MG tablet   Other Relevant Orders   Hemoglobin A1c   Comprehensive metabolic panel     A total of 25 minutes of face to face time was spent with patient more than half of which was spent in counselling about the above mentioned  conditions  and coordination of care  I have discontinued Laksh G. Plotts's glipiZIDE. I have also changed his gabapentin, tamsulosin, and omeprazole. Additionally, I am having him start on glipiZIDE. Lastly, I am having him maintain his aspirin, enalapril, ergocalciferol, atorvastatin, and levothyroxine.  Meds ordered this encounter  Medications  . glipiZIDE (GLUCOTROL XL) 5 MG 24 hr tablet    Sig: Take 1 tablet (5 mg total) by mouth daily with breakfast.    Dispense:  90 tablet    Refill:  0  . gabapentin (NEURONTIN) 100 MG capsule    Sig: 1 tablet in am and afternoon  2 in evening    Dispense:  120 capsule    Refill:  5  . atorvastatin (LIPITOR) 20 MG tablet    Sig: Take 1 tablet (20 mg total) by mouth daily.    Dispense:  90 tablet    Refill:  1  . tamsulosin (FLOMAX) 0.4 MG CAPS capsule    Sig: Take 1 capsule (0.4 mg total) by mouth daily.    Dispense:  90 capsule    Refill:  1  . omeprazole (PRILOSEC) 40 MG capsule    Sig: Take 1 capsule (40 mg total) by mouth daily.    Dispense:  90 capsule    Refill:  1  . levothyroxine (SYNTHROID, LEVOTHROID) 150 MCG tablet    Sig: TAKE ONE TABLET BY MOUTH ONCE DAILY BEFORE BREAKFAST    Dispense:  90 tablet    Refill:  1    Medications Discontinued During This Encounter  Medication Reason  . glipiZIDE (GLUCOTROL) 5 MG tablet   . gabapentin (NEURONTIN) 100 MG capsule   . atorvastatin (LIPITOR) 20 MG tablet Reorder  . tamsulosin (FLOMAX) 0.4 MG CAPS capsule Reorder  . omeprazole (PRILOSEC) 40 MG capsule Reorder  . levothyroxine (SYNTHROID, LEVOTHROID) 150 MCG tablet Reorder    Follow-up: Return in about 3 months (around 05/08/2018) for follow up diabetes.   Crecencio Mc, MD

## 2018-02-06 ENCOUNTER — Other Ambulatory Visit: Payer: Self-pay | Admitting: Internal Medicine

## 2018-02-06 MED ORDER — GLUCOSE BLOOD VI STRP: ORAL_STRIP | 2 refills | Status: DC

## 2018-02-06 NOTE — Assessment & Plan Note (Signed)
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 No recurrence , tsh is at goal  Lab Results  Component Value Date   TSH 1.85 01/28/2018

## 2018-02-06 NOTE — Telephone Encounter (Signed)
Copied from [place] [id number]. Topic: Quick Communication - Rx Refill/Question >> Feb 06, 2018  2:12 PM Bea Graff, NT wrote: Medication: glucose blood (ONE TOUCH ULTRA TEST) test strip  Has the patient contacted their pharmacy? Yes.   (Agent: If no, request that the patient contact the pharmacy for the refill.) (Agent: If yes, when and what did the pharmacy advise?)  Preferred Pharmacy (with phone number or street name): [id number] - [place], [address] [phone] (Phone) [phone] (Fax)    Agent: Please be advised that RX refills may take up to 3 business days. We ask that you follow-up with your pharmacy.

## 2018-02-06 NOTE — Telephone Encounter (Signed)
Refill sent.

## 2018-02-06 NOTE — Assessment & Plan Note (Addendum)
Not at goal due to dietary non adherence until recently .  Changing glipizide to XL 5 mg start.  Review BS in 2 weeks, for medication changes . Continue asa,  Enalapril and lipitor   Lab Results  Component Value Date   HGBA1C 8.5 (H) 01/28/2018   Lab Results  Component Value Date   MICROALBUR 7.2 (H) 01/28/2018

## 2018-02-11 ENCOUNTER — Telehealth: Payer: Self-pay | Admitting: Internal Medicine

## 2018-02-11 ENCOUNTER — Other Ambulatory Visit: Payer: Self-pay

## 2018-02-11 ENCOUNTER — Ambulatory Visit
Admission: RE | Admit: 2018-02-11 | Discharge: 2018-02-11 | Disposition: A | Discharge status: Home / Self Care | Payer: PPO | Source: Ambulatory Visit | Attending: Oncology | Admitting: Oncology

## 2018-02-11 DIAGNOSIS — K8020 Calculus of gallbladder without cholecystitis without obstruction: Secondary | ICD-10-CM | POA: Insufficient documentation

## 2018-02-11 DIAGNOSIS — Z902 Acquired absence of lung [part of]: Secondary | ICD-10-CM | POA: Insufficient documentation

## 2018-02-11 DIAGNOSIS — Z85850 Personal history of malignant neoplasm of thyroid: Secondary | ICD-10-CM | POA: Insufficient documentation

## 2018-02-11 DIAGNOSIS — I700 Atherosclerosis of aorta: Secondary | ICD-10-CM | POA: Diagnosis not present

## 2018-02-11 DIAGNOSIS — R0602 Shortness of breath: Secondary | ICD-10-CM | POA: Diagnosis not present

## 2018-02-11 DIAGNOSIS — J432 Centrilobular emphysema: Secondary | ICD-10-CM | POA: Insufficient documentation

## 2018-02-11 DIAGNOSIS — D3502 Benign neoplasm of left adrenal gland: Secondary | ICD-10-CM | POA: Diagnosis not present

## 2018-02-11 DIAGNOSIS — J90 Pleural effusion, not elsewhere classified: Secondary | ICD-10-CM | POA: Diagnosis not present

## 2018-02-11 IMAGING — CT CT CHEST W/O CM
2 of 3 series · 15 of 36 positions shown, 18 images · non-contrast
Comparison: [DATE] chest CT.

CLINICAL DATA: Dyspnea. History of stage IIA right lower lobe
squamous cell lung carcinoma status post right lower lobectomy [VL].
Restaging. History of thyroidectomy for thyroid cancer in [VL].

EXAM:
CT CHEST WITHOUT CONTRAST
TECHNIQUE: Multidetector CT imaging of the chest was performed following the
standard protocol without IV contrast.

[Series 2: thorax · axial · 0.74mm/px · z∈[-920,-656]mm · 12 of 156 slices shown, 15 images]
[im 12/156  mediastinal]
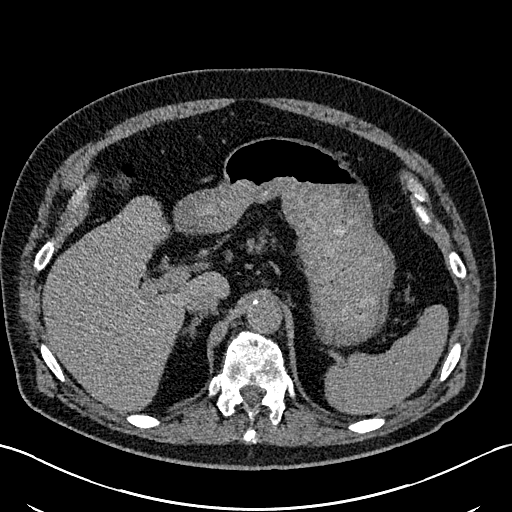
[im 12/156  lung]
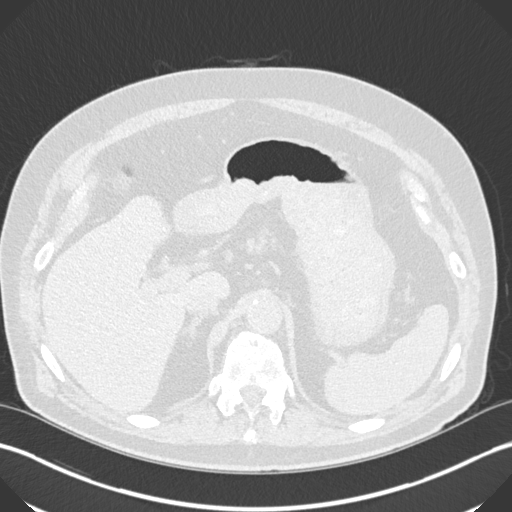
[im 23/156  lung]
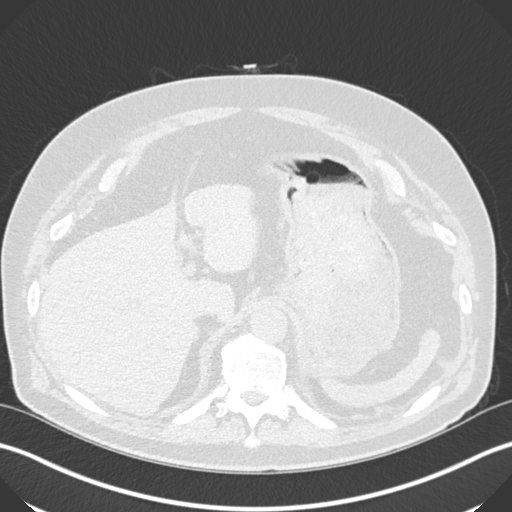
[im 35/156  lung]
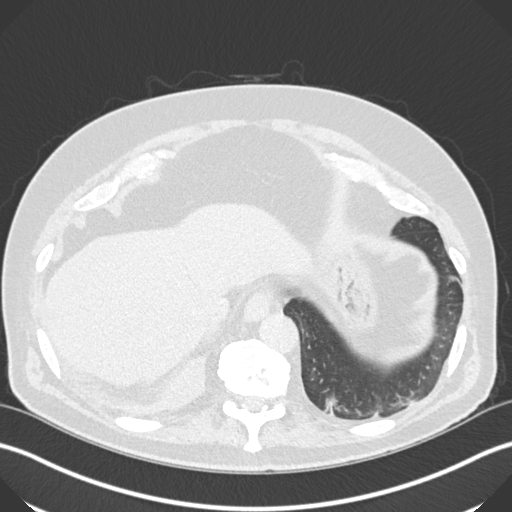
[im 46/156  lung]
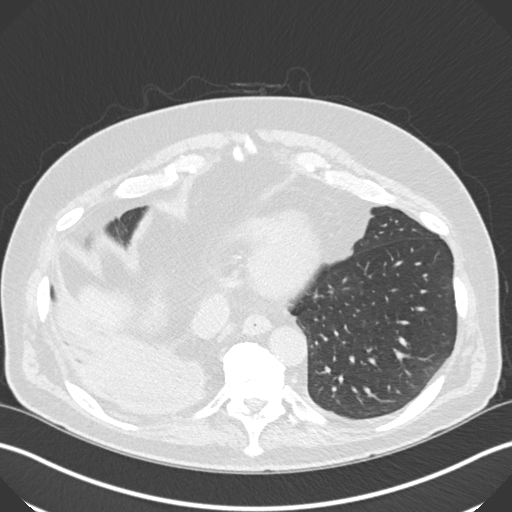
[im 58/156  mediastinal]
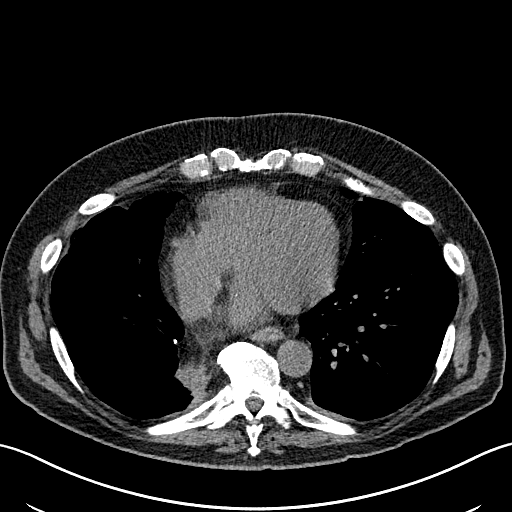
[im 58/156  lung]
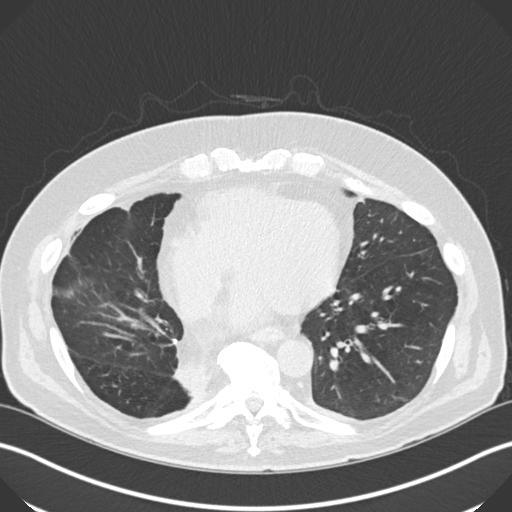
[im 69/156  lung]
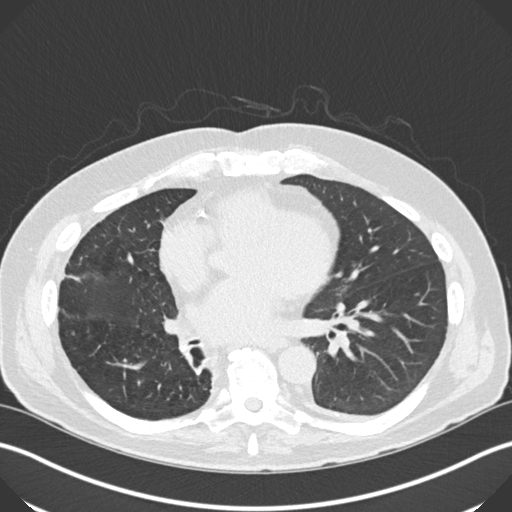
[im 87/156  lung]
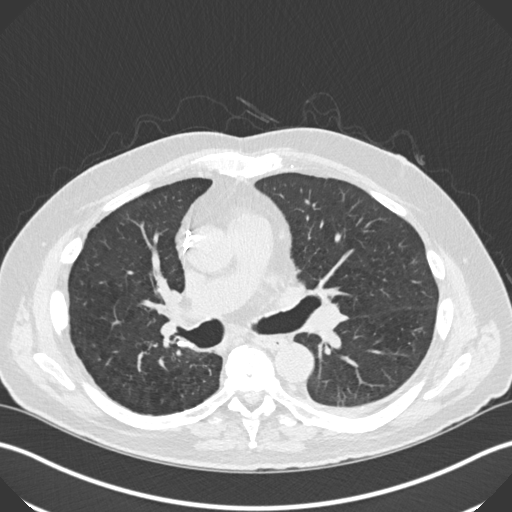
[im 98/156  lung]
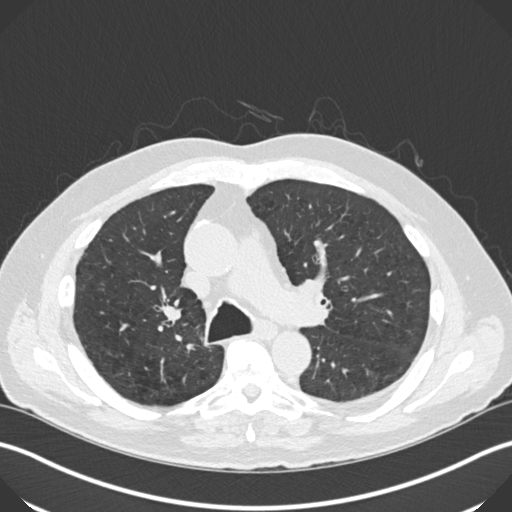
[im 110/156  mediastinal]
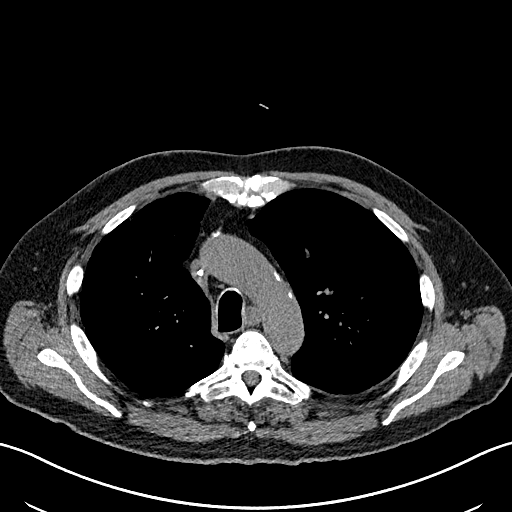
[im 110/156  lung]
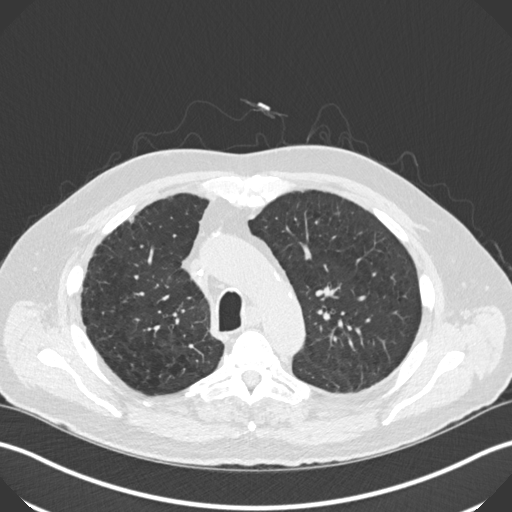
[im 121/156  lung]
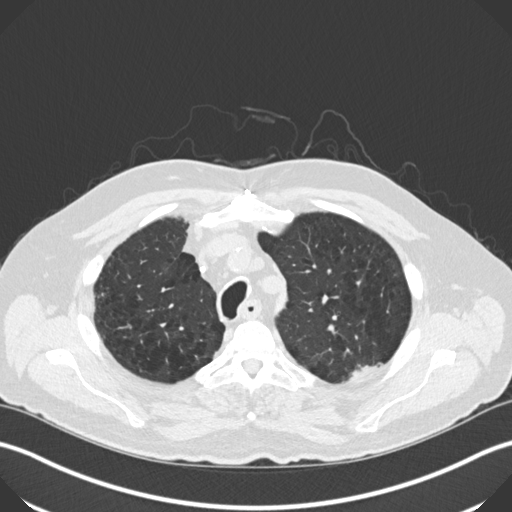
[im 133/156  lung]
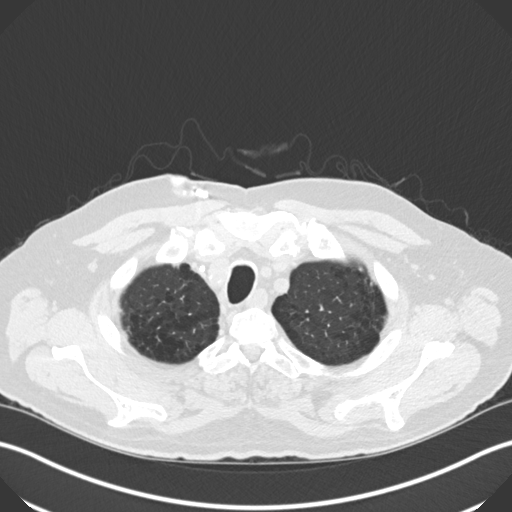
[im 144/156  lung]
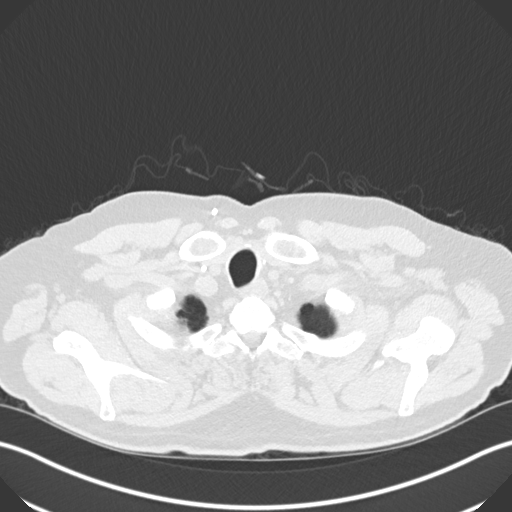

[Series 5: coronal · coronal · 0.62mm/px · 3 of 148 slices shown]
[im 30/148  lung]
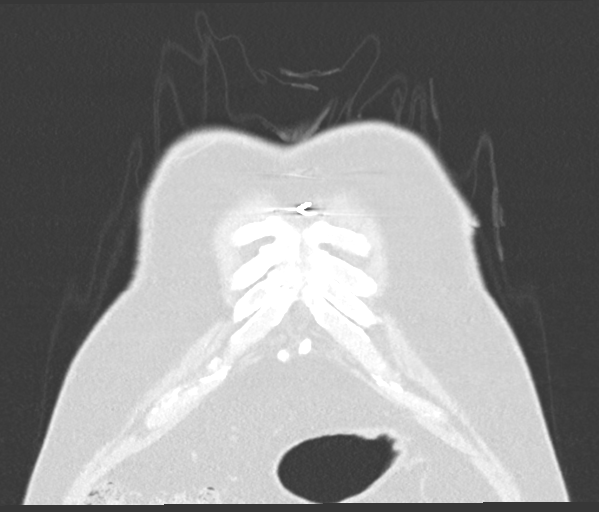
[im 59/148  lung]
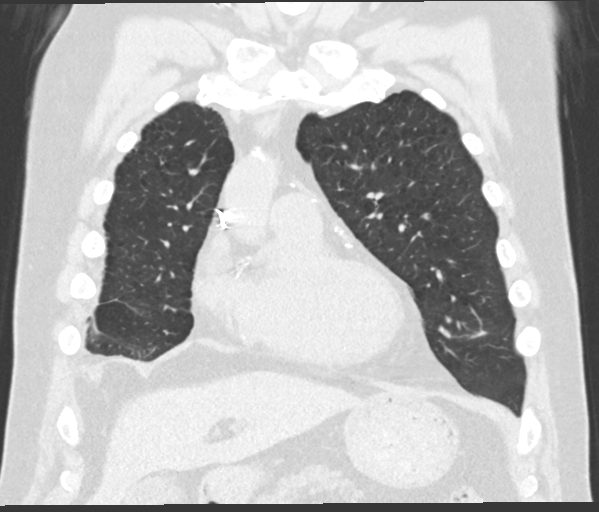
[im 89/148  lung]
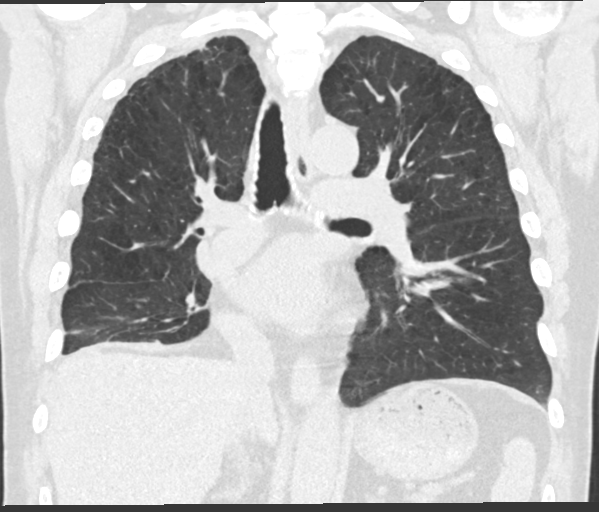

[15 of 36 positions shown; findings below may reference images not displayed]

FINDINGS: Cardiovascular: Normal heart size. No significant pericardial
effusion/thickening. Left main and 3 vessel coronary atherosclerosis
status post CABG. Atherosclerotic nonaneurysmal thoracic aorta.
Normal caliber pulmonary arteries. Right internal jugular MediPort
terminates in the middle third of the SVC.

Mediastinum/Nodes: Thyroidectomy. Unremarkable esophagus. No
pathologically enlarged axillary, mediastinal or hilar lymph nodes,
noting limited sensitivity for the detection of hilar adenopathy on
this noncontrast study.

Lungs/Pleura: No pneumothorax. Status post right lower lobectomy.
Stable smooth pleural thickening with loculated small pleural
effusion in the lower right pleural space. No left pleural effusion.
Moderate to severe centrilobular emphysema with diffuse bronchial
wall thickening. Stable small parenchymal band at the right lung
base compatible with postsurgical scarring. No acute consolidative
airspace disease, lung masses or significant pulmonary nodules.

Upper abdomen: Cholelithiasis. Partially visualized 1.5 cm left
adrenal adenoma, stable.

Musculoskeletal: No aggressive appearing focal osseous lesions.
Intact sternotomy wires. Moderate thoracic spondylosis.
IMPRESSION: 1. Stable small complex loculated pleural effusion in the lower
right pleural space status post right lower lobectomy. No findings
suspicious for local tumor recurrence.
2. No evidence of metastatic disease in the chest.
3. Cholelithiasis.
4. Stable left adrenal adenoma.

Aortic Atherosclerosis ([VL]-[VL]) and Emphysema ([VL]-[VL]).

## 2018-02-11 MED ORDER — BLOOD GLUCOSE MONITOR KIT: PACK | 0 refills | Status: DC

## 2018-02-11 NOTE — Telephone Encounter (Signed)
Copied from [place] [id number]. Topic: Quick Communication - See Telephone Encounter >> Feb 11, 2018  4:07 PM Rutherford Nail, NT wrote: CRM for notification. See Telephone encounter for: 02/11/18. Gerald Stabs from [facility] calling and states that the patient is needing a glucose meter to check his blood sugars. States that testing strips were sent but patient did not have a meter. States that a diagnosis is needed and in parenthesis the name of the meter incase they need to change the brand. Please advise. CB#: [phone]

## 2018-02-11 NOTE — Telephone Encounter (Signed)
Script faxed as requested.

## 2018-02-21 ENCOUNTER — Ambulatory Visit: Payer: PPO

## 2018-02-25 ENCOUNTER — Telehealth: Payer: Self-pay | Admitting: Internal Medicine

## 2018-02-25 MED ORDER — GLIPIZIDE ER 5 MG PO TB24: 10.0000 mg | ORAL_TABLET | Freq: Every day | ORAL | 0 refills | Status: DC

## 2018-02-25 NOTE — Telephone Encounter (Signed)
Patient is aware and stated that he would increase dose and send in readings in two weeks.

## 2018-02-25 NOTE — Telephone Encounter (Signed)
Blood sugars reviewed ,  All are high.  If he has been taking glucotrol 5 mg daily since august 15th,  Then  Please increase the glucotrol to 10 mg daily in the morning .  Continue to check sugars once or twice daily and send readings in two weeks  TT

## 2018-03-06 ENCOUNTER — Other Ambulatory Visit: Payer: Self-pay | Admitting: Internal Medicine

## 2018-03-06 ENCOUNTER — Telehealth: Payer: Self-pay | Admitting: Internal Medicine

## 2018-03-06 DIAGNOSIS — E1121 Type 2 diabetes mellitus with diabetic nephropathy: Secondary | ICD-10-CM

## 2018-03-06 NOTE — Telephone Encounter (Signed)
Blood sugars reviewed.  morning sugars are high .  Is he taking 5 mg or 10 mg of glucotrol xl?

## 2018-03-06 NOTE — Telephone Encounter (Signed)
Please see unrouted  message

## 2018-03-07 ENCOUNTER — Other Ambulatory Visit: Payer: Self-pay | Admitting: Internal Medicine

## 2018-03-07 NOTE — Telephone Encounter (Signed)
Spoke with pt and informed him of the medication change. Pt gave a verbal understanding and stated that he will start taking the new dose in the morning.

## 2018-03-07 NOTE — Telephone Encounter (Signed)
Spoke with pt and he stated that he is taking 5mg  at night and 2.5mg  in the morning.

## 2018-03-07 NOTE — Telephone Encounter (Signed)
Confirm that he has picked up the glucotrol XL (Once daily) med that was sent in on Sept 3rd.  He needs to take 2 tablets once daily in the am (total 10 mg ) or in the evening,  whichver is most convenient

## 2018-03-18 DIAGNOSIS — I2510 Atherosclerotic heart disease of native coronary artery without angina pectoris: Secondary | ICD-10-CM | POA: Diagnosis not present

## 2018-03-18 DIAGNOSIS — C3490 Malignant neoplasm of unspecified part of unspecified bronchus or lung: Secondary | ICD-10-CM | POA: Diagnosis not present

## 2018-03-18 DIAGNOSIS — R0602 Shortness of breath: Secondary | ICD-10-CM | POA: Diagnosis not present

## 2018-03-18 DIAGNOSIS — R002 Palpitations: Secondary | ICD-10-CM | POA: Diagnosis not present

## 2018-03-18 DIAGNOSIS — K219 Gastro-esophageal reflux disease without esophagitis: Secondary | ICD-10-CM | POA: Diagnosis not present

## 2018-03-18 DIAGNOSIS — N182 Chronic kidney disease, stage 2 (mild): Secondary | ICD-10-CM | POA: Diagnosis not present

## 2018-03-18 DIAGNOSIS — E119 Type 2 diabetes mellitus without complications: Secondary | ICD-10-CM | POA: Diagnosis not present

## 2018-03-18 DIAGNOSIS — E7849 Other hyperlipidemia: Secondary | ICD-10-CM | POA: Diagnosis not present

## 2018-03-18 DIAGNOSIS — I5022 Chronic systolic (congestive) heart failure: Secondary | ICD-10-CM | POA: Diagnosis not present

## 2018-03-18 DIAGNOSIS — I25719 Atherosclerosis of autologous vein coronary artery bypass graft(s) with unspecified angina pectoris: Secondary | ICD-10-CM | POA: Diagnosis not present

## 2018-03-18 DIAGNOSIS — I10 Essential (primary) hypertension: Secondary | ICD-10-CM | POA: Diagnosis not present

## 2018-03-18 DIAGNOSIS — Z87891 Personal history of nicotine dependence: Secondary | ICD-10-CM | POA: Diagnosis not present

## 2018-03-23 MED ORDER — METFORMIN HCL 500 MG PO TABS: 500.0000 mg | ORAL_TABLET | Freq: Two times a day (BID) | ORAL | 3 refills | Status: DC

## 2018-03-23 NOTE — Assessment & Plan Note (Addendum)
Glipizide XL 10 mg daily started In August. Home reports of fasting sugars from 9/11 to 9/25 range from 130 to 155. Pre dinner 100 to 150. Adding metformin 500 mg bid

## 2018-03-23 NOTE — Telephone Encounter (Signed)
Blood sugars are improved on the 10 mg once daily dose of glucotrol., but still above goal   Will add metformin 500 mg twice daly .  Both rx's sent

## 2018-03-24 NOTE — Telephone Encounter (Signed)
Below 80 would be too low

## 2018-03-24 NOTE — Telephone Encounter (Signed)
Spoke with pt and informed him that Dr. Derrel Nip stated that a sugar reading below 80 is too low. Advised the pt that if he started seeing readings lower than 80 he would need to give [place] a call so we could let Dr. Derrel Nip know so if medication changes needed to be made they could. Pt gave a verbal understanding.

## 2018-03-24 NOTE — Telephone Encounter (Signed)
Pt stated "one day this weekend" his sugar was 87 when he woke up and 124 that evening. Pt stated that he would start the metformin but wanted to know what number would be too low for a sugar reading?

## 2018-03-28 ENCOUNTER — Other Ambulatory Visit: Payer: Self-pay | Admitting: Internal Medicine

## 2018-03-31 ENCOUNTER — Other Ambulatory Visit: Payer: Self-pay | Admitting: Internal Medicine

## 2018-04-03 ENCOUNTER — Other Ambulatory Visit: Payer: Self-pay | Admitting: Internal Medicine

## 2018-04-17 ENCOUNTER — Other Ambulatory Visit: Payer: Self-pay | Admitting: Internal Medicine

## 2018-04-18 ENCOUNTER — Other Ambulatory Visit: Payer: Self-pay | Admitting: Internal Medicine

## 2018-04-22 ENCOUNTER — Telehealth: Payer: Self-pay | Admitting: Internal Medicine

## 2018-04-22 MED ORDER — GLIPIZIDE ER 10 MG PO TB24: 10.0000 mg | ORAL_TABLET | Freq: Every day | ORAL | 1 refills | Status: DC

## 2018-04-22 NOTE — Telephone Encounter (Signed)
Patient advised of below and verbalized understanding. He states that he will need script for Glipizide  XL 5 mg  sent in he has been taking two of 5 mg tablets do you want to send in 10mg  .

## 2018-04-22 NOTE — Telephone Encounter (Signed)
GLIPIZIDE XL CHANGED TO THE 10 MG DOSE SENT IN . PLEASE  REMIND PATIENT TO TAKE ONLY ONE DAILY

## 2018-04-22 NOTE — Telephone Encounter (Signed)
Fasting blood sugars are at goal for the most part.  please start checking once daily after lunch

## 2018-04-23 NOTE — Telephone Encounter (Signed)
Looks like this medication was discontinued.

## 2018-04-23 NOTE — Telephone Encounter (Signed)
Refilled

## 2018-04-23 NOTE — Telephone Encounter (Signed)
Patient advised of below and verbalized understanding.  

## 2018-04-23 NOTE — Telephone Encounter (Signed)
Okay to refill, or should pt be on OTC Vit D now?

## 2018-04-28 ENCOUNTER — Telehealth: Payer: Self-pay | Admitting: Internal Medicine

## 2018-04-28 NOTE — Telephone Encounter (Signed)
Copied from [place] [id number]. Topic: Quick Communication - See Telephone Encounter >> Apr 28, 2018  4:45 PM Blase Mess A wrote: CRM for notification. See Telephone encounter for: 04/28/18. Patient is calling to see if the office received his blood sugar readings for 30 days via fax please advise fax was sent around 7:30a 04/28/18

## 2018-04-29 NOTE — Telephone Encounter (Signed)
Patient is calling to confirm this fax. Please contact patient.

## 2018-04-29 NOTE — Telephone Encounter (Signed)
Spoke with pt to let him know that we did receive his fax and let pt know that as soon as Dr. Derrel Nip reviewed them we would give him a call back. Pt gave a verbal understanding.

## 2018-05-07 ENCOUNTER — Other Ambulatory Visit: Payer: Self-pay | Admitting: Internal Medicine

## 2018-05-14 DIAGNOSIS — E890 Postprocedural hypothyroidism: Secondary | ICD-10-CM | POA: Diagnosis not present

## 2018-05-15 DIAGNOSIS — E890 Postprocedural hypothyroidism: Secondary | ICD-10-CM | POA: Diagnosis not present

## 2018-05-15 DIAGNOSIS — Z85850 Personal history of malignant neoplasm of thyroid: Secondary | ICD-10-CM | POA: Diagnosis not present

## 2018-06-06 ENCOUNTER — Other Ambulatory Visit: Payer: Self-pay | Admitting: Internal Medicine

## 2018-06-10 ENCOUNTER — Telehealth: Payer: Self-pay | Admitting: Internal Medicine

## 2018-06-10 NOTE — Telephone Encounter (Signed)
Pt dropped off BP readings. Readings are up front in Dr. Lupita Dawn color folder.

## 2018-06-10 NOTE — Telephone Encounter (Signed)
Placed in yellow results folder.  °

## 2018-06-13 NOTE — Telephone Encounter (Signed)
I cannot tell if the readings are pre or post meals, but they look pretty good  Please tell him we will leave the blood sugar sheets at the front test for him to use in the future that will clarify this

## 2018-06-13 NOTE — Telephone Encounter (Signed)
Pt stated that he thought he was supposed to do all his readings after lunch, so the ones that he just sent were all after eating lunch. I explained to the pt that one we would like for him to do a fasting sugar and the next day do a reading 2 hours after eating a meal. The pt stated that sometimes he doesn't get to eat until really late.

## 2018-07-05 ENCOUNTER — Other Ambulatory Visit: Payer: Self-pay | Admitting: Internal Medicine

## 2018-07-16 ENCOUNTER — Telehealth: Payer: Self-pay | Admitting: Internal Medicine

## 2018-07-16 NOTE — Telephone Encounter (Unsigned)
Copied from [place] [phone]. Topic: Quick Communication - Rx Refill/Question >> Jul 16, 2018  8:22 AM Carolyn Stare wrote: Medication   gabapentin (NEURONTIN) 100 MG capsule     Pharmacy told pt they did not received RX   Has the patient contacted their pharmacy  yes    [facility]   Agent: Please be advised that RX refills may take up to 3 business days. We ask that you follow-up with your pharmacy.

## 2018-07-16 NOTE — Telephone Encounter (Signed)
Call to pharmacy- Rx given verbal- they did not receive the electronic message.

## 2018-07-28 ENCOUNTER — Other Ambulatory Visit: Payer: Self-pay | Admitting: Internal Medicine

## 2018-07-29 ENCOUNTER — Ambulatory Visit (INDEPENDENT_AMBULATORY_CARE_PROVIDER_SITE_OTHER): Payer: PPO

## 2018-07-29 VITALS — BP 136/72 | HR 70 | Temp 97.4°F | Resp 16 | Ht 70.0 in | Wt 183.1 lb

## 2018-07-29 DIAGNOSIS — Z0000 Encounter for general adult medical examination without abnormal findings: Secondary | ICD-10-CM | POA: Diagnosis not present

## 2018-07-29 DIAGNOSIS — E119 Type 2 diabetes mellitus without complications: Secondary | ICD-10-CM | POA: Diagnosis not present

## 2018-07-29 LAB — COMPREHENSIVE METABOLIC PANEL
ALBUMIN: 4.1 g/dL (ref 3.5–5.2)
ALK PHOS: 84 U/L (ref 39–117)
ALT: 28 U/L (ref 0–53)
AST: 16 U/L (ref 0–37)
BUN: 27 mg/dL — ABNORMAL HIGH (ref 6–23)
CALCIUM: 9.9 mg/dL (ref 8.4–10.5)
CO2: 26 mEq/L (ref 19–32)
Chloride: 104 mEq/L (ref 96–112)
Creatinine, Ser: 1.73 mg/dL — ABNORMAL HIGH (ref 0.40–1.50)
GFR: 39.17 mL/min — AB (ref >60.00)
Glucose, Bld: 106 mg/dL — ABNORMAL HIGH (ref 70–99)
POTASSIUM: 5 meq/L (ref 3.5–5.1)
Sodium: 139 mEq/L (ref 135–145)
TOTAL PROTEIN: 7.2 g/dL (ref 6.0–8.3)
Total Bilirubin: 0.4 mg/dL (ref 0.2–1.2)

## 2018-07-29 LAB — HEMOGLOBIN A1C: HEMOGLOBIN A1C: 6.7 % — AB (ref 4.6–6.5)

## 2018-07-29 NOTE — Patient Instructions (Addendum)
  Nathaniel Lane , Thank you for taking time to come for your Medicare Wellness Visit. I appreciate your ongoing commitment to your health goals. Please review the following plan we discussed and let me know if I can assist you in the future.   These are the goals we discussed: Goals    . Increase physical activity     Stay active. Walk for exercise, moderate pace.       This is a list of the screening recommended for you and due dates:  Health Maintenance  Topic Date Due  . Tetanus Vaccine  03/05/2017  . Complete foot exam   05/06/2018  . Hemoglobin A1C  07/31/2018  . Eye exam for diabetics  01/03/2019  . Colon Cancer Screening  04/25/2021  .  Hepatitis C: One time screening is recommended by Center for Disease Control  (CDC) for  adults born from [birth year] through 1963.   Completed  . Pneumonia vaccines  Completed  . Flu Shot  Discontinued

## 2018-07-29 NOTE — Progress Notes (Signed)
Subjective:   Nathaniel Lane is a 73 y.o. male who presents for Medicare Annual/Subsequent preventive examination.  Review of Systems:  No ROS.  Medicare Wellness Visit. Additional risk factors are reflected in the social history. Cardiac Risk Factors include: advanced age (>27mn, >>46women);hypertension;male gender;diabetes mellitus     Objective:    Vitals: BP 136/72 (BP Location: Left Arm, Patient Position: Sitting, Cuff Size: Normal)   Pulse 70   Temp (!) 97.4 F (36.3 C) (Oral)   Resp 16   Ht '5\' 10"'$  (1.778 m)   Wt 183 lb 1.9 oz (83.1 kg)   SpO2 96%   BMI 26.27 kg/m   Body mass index is 26.27 kg/m.  Advanced Directives 07/29/2018 01/28/2018 02/20/2017 01/28/2017 01/19/2016 11/01/2014  Does Patient Have a Medical Advance Directive? Yes Yes Yes No No Yes  Type of Advance Directive Healthcare Power of [place] Does patient want to make changes to medical advance directive? No - Patient declined - No - Patient declined - - No - Patient declined  Copy of [place] Chart? No - copy requested - No - copy requested - - No - copy requested  Would patient like information on creating a medical advance directive? - - - No - Patient declined No - patient declined information -    Tobacco Social History   Tobacco Use  Smoking Status Former Smoker  . Last attempt to quit: 07/15/2005  . Years since quitting: 13.0  Smokeless Tobacco Never Used     Counseling given: Not Answered   Clinical Intake:  Pre-visit preparation completed: Yes  Pain : No/denies pain     Diabetes: Yes(Followed by pcp)  How often do you need to have someone help you when you read instructions, pamphlets, or other written materials from your doctor or pharmacy?: 1 - Never  Interpreter Needed?: No     Past Medical History:  Diagnosis Date  . Cancer ([place] 2012   Stage IIA squamous cell Lung Ca   . Diabetes mellitus   . Gout   . Hyperlipidemia   . Hypertension   . Myocardial infarction ([place]   . Thyroid cancer ([place] 2013   s/p thyroidectomy 2014   Past Surgical History:  Procedure Laterality Date  . COLONOSCOPY N/A 04/25/2016   Procedure: COLONOSCOPY;  Surgeon: RManya Silvas MD;  [facility];  Service: Endoscopy;  Laterality: N/A;  Diabetic (oral meds)  . CORONARY ARTERY BYPASS GRAFT  2007  . LUNG LOBECTOMY    . TOTAL THYROIDECTOMY N/A    Family History  Problem Relation Age of Onset  . Hypertension Other   . Heart disease Father    Social History   Socioeconomic History  . Marital status: Married    Spouse name: Not on file  . Number of children: Not on file  . Years of education: Not on file  . Highest education level: Not on file  Occupational History  . 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 Financial resource strain: Not hard at all  . Food insecurity:    Worry: Never true    Inability: Never true  . Transportation needs:    Medical: No    Non-medical: No  Tobacco Use  . Smoking status: Former Smoker    Last attempt to quit: 07/15/2005    Years since quitting: 13.0  . Smokeless  tobacco: Never Used  Substance and Sexual Activity  . Alcohol use: No  . Drug use: No  . Sexual activity: Not on file  Lifestyle  . Physical activity:    Days per week: Not on file    Minutes per session: Not on file  . Stress: Not on file  Relationships  . Social connections:    Talks on phone: Not on file    Gets together: Not on file    Attends religious service: Not on file    Active member of club or organization: Not on file    Attends meetings of clubs or organizations: Not on file    Relationship status: Not on file  Other Topics Concern  . Not on file  Social History Narrative  . Not on file    Outpatient Encounter Medications as of 07/29/2018  Medication Sig  . aspirin 81 MG EC tablet TAKE 1 TABLET (81 MG  TOTAL) BY MOUTH DAILY. SWALLOW WHOLE.  Marland Kitchen atorvastatin (LIPITOR) 20 MG tablet TAKE 1 TABLET BY MOUTH EVERY DAY  . blood glucose meter kit and supplies KIT Dispense based on patient and insurance preference. Use up to four times daily as directed. (FOR ICD-10 E11.29).  . enalapril (VASOTEC) 5 MG tablet TAKE 1 TABLET (5 MG TOTAL) BY MOUTH DAILY.  Marland Kitchen gabapentin (NEURONTIN) 100 MG capsule TAKE 1 CAPSULE (100 MG TOTAL) BY MOUTH AT BEDTIME. INCREASE DOSE WEEKLY BY 100 MG (1 CAPSULE)  . glipiZIDE (GLUCOTROL XL) 10 MG 24 hr tablet Take 1 tablet (10 mg total) by mouth daily with breakfast.  . glipiZIDE (GLUCOTROL XL) 5 MG 24 hr tablet TAKE 1 TABLET BY MOUTH EVERY DAY WITH BREAKFAST  . glucose blood (ONE TOUCH ULTRA TEST) test strip USE AS DIRECTED 4 TIMES A DAY  . levothyroxine (SYNTHROID, LEVOTHROID) 150 MCG tablet TAKE ONE TABLET BY MOUTH ONCE DAILY BEFORE BREAKFAST  . metFORMIN (GLUCOPHAGE) 500 MG tablet Take 1 tablet (500 mg total) by mouth 2 (two) times daily with a meal.  . omeprazole (PRILOSEC) 40 MG capsule Take 1 capsule (40 mg total) by mouth daily.  Glory Rosebush DELICA LANCETS FINE MISC USE AS DIRECTED 4 TIMES A DAY  . tamsulosin (FLOMAX) 0.4 MG CAPS capsule Take 1 capsule (0.4 mg total) by mouth daily.  . Vitamin D, Ergocalciferol, (DRISDOL) 50000 units CAPS capsule TAKE ONE CAPSULE BY MOUTH ONE TIME PER WEEK   No facility-administered encounter medications on file as of 07/29/2018.     Activities of Daily Living In your present state of health, do you have any difficulty performing the following activities: 07/29/2018  Hearing? N  Vision? N  Difficulty concentrating or making decisions? N  Walking or climbing stairs? Y  Dressing or bathing? N  Doing errands, shopping? N  Preparing Food and eating ? N  Using the Toilet? N  In the past six months, have you accidently leaked urine? N  Do you have problems with loss of bowel control? N  Managing your Medications? N  Managing your Finances? N    Housekeeping or managing your Housekeeping? Y  Some recent data might be hidden    Patient Care Team: Crecencio Mc, MD as PCP - General (Internal Medicine)   Assessment:   This is a routine wellness examination for [place].  Diabetes- followed by pcp. Home record blood sugar readings placed in pcp box.    Labs complete per existing order.   Return on 2/14 as scheduled for diabetic follow up.  Health Screenings  Colonoscopy -04/25/16 Glaucoma -none  Hearing  -demonstrates normal hearing in conversation Hemoglobin A1C -01/28/18 (8.5) Cholesterol -01/28/18 (153) Dental- No dental care due to cost. Encouraged to brush, floss and rinse daily. Vision- Annual visits  Social  Alcohol intake-no Smoking history- former Smokers in home? none Illicit drug use?none Exercise -none Diet -regular Sexually Active -never  Safety  Patient feels safe at home.  Patient does have smoke detectors at home  Patient does wear sunscreen or protective clothing when in direct sunlight  Patient does wear seat belt when driving or riding with others.   Activities of Daily Living Patient has some assistance with household chores and laundry. Denies needing assistance with: driving, feeding themselves, getting from bed to chair, getting to the toilet, bathing/showering, dressing, managing money, climbing flight of stairs, or preparing meals. He is the primary care taker in the home; his wife has dementia.   Depression Screen Patient denies losing interest in daily life, feeling hopeless, or crying easily over simple problems.   Fall Screen Patient denies being afraid of falling or falling in the last year.   Memory Screen Patient denies problems with memory, misplacing items, and is able to balance checkbook/bank accounts.  Patient is alert, normal appearance, oriented to person/place/and time. Correctly identified the president of the [place], recall of 3/3 objects, and performing simple calculations.   Patient displays appropriate judgement and can read correct time from watch face.   Immunizations The following Immunizations are up to date: pneumonia, and tetanus.  Influenza, Tdap and shingles discussed.  Other Providers Patient Care Team: Crecencio Mc, MD as PCP - General (Internal Medicine)  Exercise Activities and Dietary recommendations Current Exercise Habits: The patient does not participate in regular exercise at present  Goals    . Increase physical activity     Stay active. Walk for exercise, moderate pace.       Fall Risk Fall Risk  07/29/2018 02/20/2017 08/15/2016 11/01/2014 01/12/2014  Falls in the past year? 0 No No No No   Depression Screen PHQ 2/9 Scores 07/29/2018 02/20/2017 08/15/2016 11/01/2014  PHQ - 2 Score 0 0 1 0  PHQ- 9 Score - 0 - -   Cognitive Function MMSE - Mini Mental State Exam 02/20/2017 11/01/2014  Orientation to time 5 5  Orientation to Place 5 5  Registration 3 3  Attention/ Calculation 5 5  Recall 3 3  Language- name 2 objects 2 2  Language- repeat 1 1  Language- follow 3 step command 3 3  Language- read & follow direction 1 1  Write a sentence 1 1  Copy design 1 1  Total score 30 30      6CIT Screen 07/29/2018  What Year? 0 points  What month? 0 points  What time? 0 points  Count back from 20 0 points  Months in reverse 0 points  Repeat phrase 0 points  Total Score 0    Immunization History  Administered Date(s) Administered  . Influenza Split 04/15/2013  . Pneumococcal Conjugate-13 07/15/2013  . Pneumococcal Polysaccharide-23 01/15/2012, 05/06/2017  . Tdap 03/06/2007   Screening Tests Health Maintenance  Topic Date Due  . TETANUS/TDAP  03/05/2017  . FOOT EXAM  05/06/2018  . HEMOGLOBIN A1C  07/31/2018  . OPHTHALMOLOGY EXAM  01/03/2019  . COLONOSCOPY  04/25/2021  . Hepatitis C Screening  Completed  . PNA vac Low Risk Adult  Completed  . INFLUENZA VACCINE  Discontinued       Plan:  End of life planning; Advance  aging; Advanced directives discussed. Copy of current HCPOA/Living Will requested.    I have personally reviewed and noted the following in the patient's chart:   . Medical and social history . Use of alcohol, tobacco or illicit drugs  . Current medications and supplements . Functional ability and status . Nutritional status . Physical activity . Advanced directives . List of other physicians . Hospitalizations, surgeries, and ER visits in previous 12 months . Vitals . Screenings to include cognitive, depression, and falls . Referrals and appointments  In addition, I have reviewed and discussed with patient certain preventive protocols, quality metrics, and best practice recommendations. A written personalized care plan for preventive services as well as general preventive health recommendations were provided to patient.     Varney Biles, LPN  02/29/7288

## 2018-07-29 NOTE — Progress Notes (Signed)
Agree f/u with PCP   [place]

## 2018-07-30 ENCOUNTER — Other Ambulatory Visit: Payer: Self-pay | Admitting: Internal Medicine

## 2018-08-08 ENCOUNTER — Ambulatory Visit (INDEPENDENT_AMBULATORY_CARE_PROVIDER_SITE_OTHER): Payer: PPO | Admitting: Internal Medicine

## 2018-08-08 ENCOUNTER — Encounter: Payer: Self-pay | Admitting: Internal Medicine

## 2018-08-08 ENCOUNTER — Other Ambulatory Visit: Payer: Self-pay | Admitting: Internal Medicine

## 2018-08-08 VITALS — BP 134/80 | HR 67 | Temp 97.6°F | Resp 15 | Ht 70.0 in | Wt 186.0 lb

## 2018-08-08 DIAGNOSIS — E1121 Type 2 diabetes mellitus with diabetic nephropathy: Secondary | ICD-10-CM | POA: Diagnosis not present

## 2018-08-08 DIAGNOSIS — Z125 Encounter for screening for malignant neoplasm of prostate: Secondary | ICD-10-CM | POA: Diagnosis not present

## 2018-08-08 DIAGNOSIS — Z85118 Personal history of other malignant neoplasm of bronchus and lung: Secondary | ICD-10-CM | POA: Diagnosis not present

## 2018-08-08 DIAGNOSIS — Z1211 Encounter for screening for malignant neoplasm of colon: Secondary | ICD-10-CM | POA: Diagnosis not present

## 2018-08-08 DIAGNOSIS — Z85850 Personal history of malignant neoplasm of thyroid: Secondary | ICD-10-CM | POA: Diagnosis not present

## 2018-08-08 DIAGNOSIS — D126 Benign neoplasm of colon, unspecified: Secondary | ICD-10-CM

## 2018-08-08 DIAGNOSIS — E782 Mixed hyperlipidemia: Secondary | ICD-10-CM | POA: Diagnosis not present

## 2018-08-08 DIAGNOSIS — E890 Postprocedural hypothyroidism: Secondary | ICD-10-CM | POA: Diagnosis not present

## 2018-08-08 MED ORDER — TETANUS-DIPHTH-ACELL PERTUSSIS 5-2.5-18.5 LF-MCG/0.5 IM SUSP: 0.5000 mL | Freq: Once | INTRAMUSCULAR | 0 refills | Status: AC

## 2018-08-08 MED ORDER — GABAPENTIN 300 MG PO CAPS: 300.0000 mg | ORAL_CAPSULE | Freq: Three times a day (TID) | ORAL | 3 refills | Status: DC

## 2018-08-08 NOTE — Patient Instructions (Addendum)
You are doing quite well!!    Since your foot pain is not controlled on 100 mg gabapentin ,   You can use the 300 mg gabapentin capsulesl at bedtime and during the day if need

## 2018-08-08 NOTE — Progress Notes (Signed)
Subjective:  [name], male    DOB: [birth year]  Age: 73 y.o. MRN: [id number]  CC: The primary encounter diagnosis was Prostate cancer screening. Diagnoses of Controlled type 2 diabetes mellitus with diabetic nephropathy, without long-term current use of insulin ([place]), Postsurgical hypothyroidism, Mixed hyperlipidemia, History of lung cancer, History of thyroid cancer, Colon AND PROSTATE CANCER SCREENING , and Tubular adenoma of colon were also pertinent to this visit.  HPI Nathaniel Lane presents for diabetes follow up. He feels generally well, is exercising several times per week and checking blood sugars once daily at variable times.  BS have been under 130 fasting and < 150 post prandially.  Denies any recent hypoglyemic events.  Taking his medications as directed. Following a carbohydrate modified diet 6 days per week. Denies numbness, burning and tingling of extremities. Appetite is good.    Hypertension: patient checks blood pressure twice weekly at home.  Readings have been for the most part <140/80 at rest . Patient is following a reduced salt diet most days and is taking medications as prescribed .  Outpatient Medications Prior to Visit  Medication Sig Dispense Refill  . aspirin 81 MG EC tablet TAKE 1 TABLET (81 MG TOTAL) BY MOUTH DAILY. SWALLOW WHOLE. 90 tablet 3  . atorvastatin (LIPITOR) 20 MG tablet TAKE 1 TABLET BY MOUTH EVERY DAY 90 tablet 1  . blood glucose meter kit and supplies KIT Dispense based on patient and insurance preference. Use up to four times daily as directed. (FOR ICD-10 E11.29). 1 each 0  . enalapril (VASOTEC) 5 MG tablet TAKE 1 TABLET (5 MG TOTAL) BY MOUTH DAILY. 90 tablet 1  . gabapentin (NEURONTIN) 100 MG capsule TAKE 1 CAPSULE (100 MG TOTAL) BY MOUTH AT BEDTIME. INCREASE DOSE WEEKLY BY 100 MG (1 CAPSULE) 270 capsule 0  . glipiZIDE (GLUCOTROL XL) 5 MG 24 hr tablet TAKE 1 TABLET BY MOUTH EVERY DAY WITH BREAKFAST 90 tablet 0  . glucose blood (ONE  TOUCH ULTRA TEST) test strip USE AS DIRECTED 4 TIMES A DAY 100 each 2  . levothyroxine (SYNTHROID, LEVOTHROID) 150 MCG tablet TAKE ONE TABLET BY MOUTH ONCE DAILY BEFORE BREAKFAST 90 tablet 1  . metFORMIN (GLUCOPHAGE) 500 MG tablet Take 1 tablet (500 mg total) by mouth 2 (two) times daily with a meal. 180 tablet 3  . omeprazole (PRILOSEC) 40 MG capsule TAKE 1 CAPSULE BY MOUTH EVERY DAY 90 capsule 1  . ONETOUCH DELICA LANCETS FINE MISC USE AS DIRECTED 4 TIMES A DAY 100 each 2  . tamsulosin (FLOMAX) 0.4 MG CAPS capsule Take 1 capsule (0.4 mg total) by mouth daily. 90 capsule 1  . glipiZIDE (GLUCOTROL XL) 10 MG 24 hr tablet Take 1 tablet (10 mg total) by mouth daily with breakfast. (Patient not taking: Reported on 08/08/2018) 90 tablet 1  . Vitamin D, Ergocalciferol, (DRISDOL) 50000 units CAPS capsule TAKE ONE CAPSULE BY MOUTH ONE TIME PER WEEK (Patient not taking: Reported on 08/08/2018) 12 capsule 0   No facility-administered medications prior to visit.     Review of Systems;  Patient denies headache, fevers, malaise, unintentional weight loss, skin rash, eye pain, sinus congestion and sinus pain, sore throat, dysphagia,  hemoptysis , cough, dyspnea, wheezing, chest pain, palpitations, orthopnea, edema, abdominal pain, nausea, melena, diarrhea, constipation, flank pain, dysuria, hematuria, urinary  Frequency, nocturia, numbness, tingling, seizures,  Focal weakness, Loss of consciousness,  Tremor, insomnia, depression, anxiety, and suicidal ideation.      Objective:  BP  134/80 (BP Location: Left Arm, Patient Position: Sitting, Cuff Size: Normal)   Pulse 67   Temp 97.6 F (36.4 C) (Oral)   Resp 15   Ht '5\' 10"'$  (1.778 m)   Wt 186 lb (84.4 kg)   SpO2 96%   BMI 26.69 kg/m   BP Readings from Last 3 Encounters:  08/08/18 134/80  07/29/18 136/72  02/05/18 120/78    Wt Readings from Last 3 Encounters:  08/08/18 186 lb (84.4 kg)  07/29/18 183 lb 1.9 oz (83.1 kg)  02/05/18 186 lb 12.8 oz (84.7  kg)    General appearance: alert, cooperative and appears stated age Ears: normal TM's and external ear canals both ears Throat: lips, mucosa, and tongue normal; teeth and gums normal Neck: no adenopathy, no carotid bruit, supple, symmetrical, trachea midline and thyroid not enlarged, symmetric, no tenderness/mass/nodules Back: symmetric, no curvature. ROM normal. No CVA tenderness. Lungs: clear to auscultation bilaterally Heart: regular rate and rhythm, S1, S2 normal, no murmur, click, rub or gallop Abdomen: soft, non-tender; bowel sounds normal; no masses,  no organomegaly Pulses: 2+ and symmetric Skin: Skin color, texture, turgor normal. No rashes or lesions Lymph nodes: Cervical, supraclavicular, and axillary nodes normal.  Lab Results  Component Value Date   HGBA1C 6.7 (H) 07/29/2018   HGBA1C 8.5 (H) 01/28/2018   HGBA1C 7.4 08/08/2017    Lab Results  Component Value Date   CREATININE 1.73 (H) 07/29/2018   CREATININE 1.75 (H) 01/28/2018   CREATININE 1.77 (H) 08/19/2017    Lab Results  Component Value Date   WBC 8.6 08/09/2016   HGB 14.5 08/09/2016   HCT 44.2 08/09/2016   PLT 152.0 08/09/2016   GLUCOSE 106 (H) 07/29/2018   CHOL 153 01/28/2018   TRIG 295.0 (H) 01/28/2018   HDL 28.70 (L) 01/28/2018   LDLDIRECT 82.0 01/28/2018   LDLCALC 79 12/12/2016   ALT 28 07/29/2018   AST 16 07/29/2018   NA 139 07/29/2018   K 5.0 07/29/2018   CL 104 07/29/2018   CREATININE 1.73 (H) 07/29/2018   BUN 27 (H) 07/29/2018   CO2 26 07/29/2018   TSH 1.85 01/28/2018   PSA 1.35 12/12/2016   INR 1.2 09/27/2011   HGBA1C 6.7 (H) 07/29/2018   MICROALBUR 7.2 (H) 01/28/2018    Assessment & Plan:   Problem List Items Addressed This Visit    Type 2 diabetes mellitus, controlled, with renal complications ([place])    Now  well-controlled on current regimen of glipizide and metformin.  hemoglobin A1c is  less than 7.0 . Patient is up -to-date on eye exam and foot exam is unchanged today.  Patient  Has early microalbuminuria .  He is  tolerating atorvastatin therapy for CAD risk reduction and enalapril for reduction in proteinuria.   Lab Results  Component Value Date   HGBA1C 6.7 (H) 07/29/2018   Lab Results  Component Value Date   MICROALBUR 7.2 (H) 01/28/2018          Tubular adenoma of colon    Continue screening every 5 years .  Next due in 2022       Postsurgical hypothyroidism    Thyroid function is WNL on current dose.  No current changes needed.   Lab Results  Component Value Date   TSH 1.85 01/28/2018         Relevant Orders   TSH   Hyperlipidemia    He is tolerating atorvastatin  He has no side effects and liver enzymes are normal. No  changes today . 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 No recurrence , tsh is at goal  Lab Results  Component Value Date   TSH 1.85 01/28/2018         History of lung cancer    He finished adjuvant chemo in July 2013 and has had no recurrence of CA  by surveillance scans,  Last one August 2019.  Managed by Alyssa [facility]       Colon AND PROSTATE CANCER SCREENING     He is  overdue for PSA which was  ordered  today.  Last screening for colon cancer was 2017 and multiple tubular adenomas were retrieved,  No high grade dysplasia.   5 YEAR FOLLOW UP ADVISED   Lab Results  Component Value Date   PSA 1.35 12/12/2016   PSA 1.20 10/26/2015          Other Visit Diagnoses    Prostate cancer screening    -  Primary   Relevant Orders   PSA, Medicare      I have discontinued Bode G. Srey's Vitamin D (Ergocalciferol). I am also having him start on gabapentin and Tdap. Additionally, I am having him maintain his levothyroxine, blood glucose meter kit and supplies,  ONETOUCH DELICA LANCETS FINE, metFORMIN, glucose blood, enalapril, glipiZIDE, aspirin, gabapentin, atorvastatin, and omeprazole.  Meds ordered this encounter  Medications  . gabapentin (NEURONTIN) 300 MG capsule    Sig: Take 1 capsule (300 mg total) by mouth 3 (three) times daily.    Dispense:  90 capsule    Refill:  3  . Tdap (BOOSTRIX) 5-2.5-18.5 LF-MCG/0.5 injection    Sig: Inject 0.5 mLs into the muscle once for 1 dose.    Dispense:  0.5 mL    Refill:  0   A total of 25 minutes of face to face time was spent with patient more than half of which was spent in counselling about the above mentioned conditions  and coordination of care    Medications Discontinued During This Encounter  Medication Reason  . glipiZIDE (GLUCOTROL XL) 10 MG 24 hr tablet Change in therapy  . Vitamin D, Ergocalciferol, (DRISDOL) 50000 units CAPS capsule Completed Course    Follow-up: Return in about 3 months (around 11/06/2018) for follow up diabetes.   Crecencio Mc, MD

## 2018-08-09 DIAGNOSIS — D126 Benign neoplasm of colon, unspecified: Secondary | ICD-10-CM | POA: Insufficient documentation

## 2018-08-09 NOTE — Assessment & Plan Note (Signed)
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 No recurrence , tsh is at goal  Lab Results  Component Value Date   TSH 1.85 01/28/2018

## 2018-08-09 NOTE — Assessment & Plan Note (Signed)
Continue screening every 5 years .  Next due in 2022

## 2018-08-09 NOTE — Assessment & Plan Note (Signed)
He is  overdue for PSA which was  ordered  today.  Last screening for colon cancer was 2017 and multiple tubular adenomas were retrieved,  No high grade dysplasia.   5 YEAR FOLLOW UP ADVISED   Lab Results  Component Value Date   PSA 1.35 12/12/2016   PSA 1.20 10/26/2015

## 2018-08-09 NOTE — Assessment & Plan Note (Signed)
Thyroid function is WNL on current dose.  No current changes needed.   Lab Results  Component Value Date   TSH 1.85 01/28/2018

## 2018-08-09 NOTE — Assessment & Plan Note (Signed)
Now  well-controlled on current regimen of glipizide and metformin.  hemoglobin A1c is  less than 7.0 . Patient is up -to-date on eye exam and foot exam is unchanged today. Patient  Has early microalbuminuria .  He is  tolerating atorvastatin therapy for CAD risk reduction and enalapril for reduction in proteinuria.   Lab Results  Component Value Date   HGBA1C 6.7 (H) 07/29/2018   Lab Results  Component Value Date   MICROALBUR 7.2 (H) 01/28/2018

## 2018-08-09 NOTE — Assessment & Plan Note (Signed)
He is tolerating atorvastatin  He has no side effects and liver enzymes are normal. No changes today . Lab Results  Component Value Date   ALT 28 07/29/2018   AST 16 07/29/2018   ALKPHOS 84 07/29/2018   BILITOT 0.4 07/29/2018

## 2018-08-09 NOTE — Assessment & Plan Note (Signed)
He finished adjuvant chemo in July 2013 and has had no recurrence of CA  by surveillance scans,  Last one August 2019.  Managed by Alyssa [facility]

## 2018-08-31 ENCOUNTER — Other Ambulatory Visit: Payer: Self-pay | Admitting: Internal Medicine

## 2018-09-24 DIAGNOSIS — K219 Gastro-esophageal reflux disease without esophagitis: Secondary | ICD-10-CM | POA: Diagnosis not present

## 2018-09-24 DIAGNOSIS — R002 Palpitations: Secondary | ICD-10-CM | POA: Diagnosis not present

## 2018-09-24 DIAGNOSIS — E119 Type 2 diabetes mellitus without complications: Secondary | ICD-10-CM | POA: Diagnosis not present

## 2018-09-24 DIAGNOSIS — N182 Chronic kidney disease, stage 2 (mild): Secondary | ICD-10-CM | POA: Diagnosis not present

## 2018-09-24 DIAGNOSIS — Z87891 Personal history of nicotine dependence: Secondary | ICD-10-CM | POA: Diagnosis not present

## 2018-09-24 DIAGNOSIS — I25719 Atherosclerosis of autologous vein coronary artery bypass graft(s) with unspecified angina pectoris: Secondary | ICD-10-CM | POA: Diagnosis not present

## 2018-09-24 DIAGNOSIS — E7849 Other hyperlipidemia: Secondary | ICD-10-CM | POA: Diagnosis not present

## 2018-09-24 DIAGNOSIS — I10 Essential (primary) hypertension: Secondary | ICD-10-CM | POA: Diagnosis not present

## 2018-09-24 DIAGNOSIS — I2510 Atherosclerotic heart disease of native coronary artery without angina pectoris: Secondary | ICD-10-CM | POA: Diagnosis not present

## 2018-09-24 DIAGNOSIS — I5022 Chronic systolic (congestive) heart failure: Secondary | ICD-10-CM | POA: Diagnosis not present

## 2018-09-24 DIAGNOSIS — R0602 Shortness of breath: Secondary | ICD-10-CM | POA: Diagnosis not present

## 2018-09-24 DIAGNOSIS — C3490 Malignant neoplasm of unspecified part of unspecified bronchus or lung: Secondary | ICD-10-CM | POA: Diagnosis not present

## 2018-09-24 DIAGNOSIS — M542 Cervicalgia: Secondary | ICD-10-CM | POA: Diagnosis not present

## 2018-10-01 ENCOUNTER — Other Ambulatory Visit: Payer: Self-pay | Admitting: Internal Medicine

## 2018-10-11 ENCOUNTER — Other Ambulatory Visit: Payer: Self-pay | Admitting: Internal Medicine

## 2018-10-23 ENCOUNTER — Other Ambulatory Visit: Payer: Self-pay

## 2018-10-23 ENCOUNTER — Telehealth: Payer: Self-pay | Admitting: Internal Medicine

## 2018-10-23 MED ORDER — GLIPIZIDE ER 5 MG PO TB24: ORAL_TABLET | ORAL | 0 refills | Status: DC

## 2018-10-23 NOTE — Telephone Encounter (Signed)
Medication refilled and sent to pharmacy. Nina,cma

## 2018-10-23 NOTE — Telephone Encounter (Signed)
Copied from [place] [phone]. Topic: Quick Communication - Rx Refill/Question >> Oct 23, 2018  9:30 AM Keene Breath wrote: Medication: glipiZIDE (GLUCOTROL XL) 5 MG 24 hr tablet  Patient called to request a refill for the above medication  Preferred Pharmacy (with phone number or street name): [id number] - [place], [address] [phone] (Phone) [phone] (Fax)

## 2018-11-10 DIAGNOSIS — Z85850 Personal history of malignant neoplasm of thyroid: Secondary | ICD-10-CM | POA: Diagnosis not present

## 2018-11-10 DIAGNOSIS — E890 Postprocedural hypothyroidism: Secondary | ICD-10-CM | POA: Diagnosis not present

## 2019-01-05 DIAGNOSIS — E890 Postprocedural hypothyroidism: Secondary | ICD-10-CM | POA: Diagnosis not present

## 2019-01-05 DIAGNOSIS — H35033 Hypertensive retinopathy, bilateral: Secondary | ICD-10-CM | POA: Diagnosis not present

## 2019-01-05 DIAGNOSIS — Z85850 Personal history of malignant neoplasm of thyroid: Secondary | ICD-10-CM | POA: Diagnosis not present

## 2019-01-05 LAB — HM DIABETES EYE EXAM

## 2019-01-18 ENCOUNTER — Other Ambulatory Visit: Payer: Self-pay | Admitting: Internal Medicine

## 2019-01-24 ENCOUNTER — Other Ambulatory Visit: Payer: Self-pay | Admitting: Internal Medicine

## 2019-01-25 ENCOUNTER — Other Ambulatory Visit: Payer: Self-pay | Admitting: Internal Medicine

## 2019-01-29 ENCOUNTER — Other Ambulatory Visit: Payer: Self-pay | Admitting: Internal Medicine

## 2019-03-12 ENCOUNTER — Other Ambulatory Visit: Payer: Self-pay | Admitting: Internal Medicine

## 2019-03-25 ENCOUNTER — Other Ambulatory Visit: Payer: Self-pay | Admitting: Internal Medicine

## 2019-03-25 MED ORDER — LOSARTAN POTASSIUM 50 MG PO TABS: 50.0000 mg | ORAL_TABLET | Freq: Every day | ORAL | 3 refills | Status: DC

## 2019-03-25 NOTE — Telephone Encounter (Signed)
Refill request for enalapril

## 2019-03-25 NOTE — Telephone Encounter (Signed)
Please tell patient that  I am making a decision to change patient's ACE Inhibitor (enalapril) to an ARB  (losartan)  based on increased reports of  angioedema (tongue swelling that can cause airway compromise) .  I also advised patient to  take it at night instead of morning,  as recent studies have shown a reduction in incidence of heart attacks and strokes.

## 2019-03-26 NOTE — Telephone Encounter (Signed)
Spoke with pt to let him know of the medication change. Pt gave a verbal understanding.

## 2019-04-20 DIAGNOSIS — I509 Heart failure, unspecified: Secondary | ICD-10-CM | POA: Diagnosis not present

## 2019-04-20 DIAGNOSIS — I25719 Atherosclerosis of autologous vein coronary artery bypass graft(s) with unspecified angina pectoris: Secondary | ICD-10-CM | POA: Diagnosis not present

## 2019-04-20 DIAGNOSIS — N182 Chronic kidney disease, stage 2 (mild): Secondary | ICD-10-CM | POA: Diagnosis not present

## 2019-04-20 DIAGNOSIS — I2510 Atherosclerotic heart disease of native coronary artery without angina pectoris: Secondary | ICD-10-CM | POA: Diagnosis not present

## 2019-04-20 DIAGNOSIS — I10 Essential (primary) hypertension: Secondary | ICD-10-CM | POA: Diagnosis not present

## 2019-04-20 DIAGNOSIS — R002 Palpitations: Secondary | ICD-10-CM | POA: Diagnosis not present

## 2019-04-20 DIAGNOSIS — C3490 Malignant neoplasm of unspecified part of unspecified bronchus or lung: Secondary | ICD-10-CM | POA: Diagnosis not present

## 2019-04-20 DIAGNOSIS — Z87891 Personal history of nicotine dependence: Secondary | ICD-10-CM | POA: Diagnosis not present

## 2019-04-20 DIAGNOSIS — I5022 Chronic systolic (congestive) heart failure: Secondary | ICD-10-CM | POA: Diagnosis not present

## 2019-04-20 DIAGNOSIS — E7849 Other hyperlipidemia: Secondary | ICD-10-CM | POA: Diagnosis not present

## 2019-04-20 DIAGNOSIS — E119 Type 2 diabetes mellitus without complications: Secondary | ICD-10-CM | POA: Diagnosis not present

## 2019-04-20 DIAGNOSIS — R0602 Shortness of breath: Secondary | ICD-10-CM | POA: Diagnosis not present

## 2019-04-27 ENCOUNTER — Other Ambulatory Visit: Payer: Self-pay | Admitting: Internal Medicine

## 2019-05-12 ENCOUNTER — Other Ambulatory Visit: Payer: Self-pay | Admitting: Internal Medicine

## 2019-05-23 ENCOUNTER — Other Ambulatory Visit: Payer: Self-pay | Admitting: Internal Medicine

## 2019-06-22 ENCOUNTER — Ambulatory Visit (INDEPENDENT_AMBULATORY_CARE_PROVIDER_SITE_OTHER): Payer: PPO | Admitting: Family

## 2019-06-22 ENCOUNTER — Telehealth: Payer: Self-pay | Admitting: Family

## 2019-06-22 ENCOUNTER — Other Ambulatory Visit: Payer: Self-pay

## 2019-06-22 ENCOUNTER — Encounter: Payer: Self-pay | Admitting: Family

## 2019-06-22 VITALS — Ht 70.0 in | Wt 186.0 lb

## 2019-06-22 DIAGNOSIS — Z20828 Contact with and (suspected) exposure to other viral communicable diseases: Secondary | ICD-10-CM | POA: Diagnosis not present

## 2019-06-22 DIAGNOSIS — R6883 Chills (without fever): Secondary | ICD-10-CM | POA: Diagnosis not present

## 2019-06-22 DIAGNOSIS — Z20822 Contact with and (suspected) exposure to covid-19: Secondary | ICD-10-CM | POA: Insufficient documentation

## 2019-06-22 MED ORDER — ALBUTEROL SULFATE HFA 108 (90 BASE) MCG/ACT IN AERS: 2.0000 | INHALATION_SPRAY | Freq: Four times a day (QID) | RESPIRATORY_TRACT | 0 refills | Status: DC | PRN

## 2019-06-22 NOTE — Telephone Encounter (Signed)
If you suspect flu and not COVID treat for flu and test for COVID

## 2019-06-22 NOTE — Telephone Encounter (Signed)
noted 

## 2019-06-22 NOTE — Telephone Encounter (Signed)
I spoke with patient & he has appointment to be tested at [facility]. He would prefer to wait on those results. If he starts experiencing he said that he would be willing to go. I asked him to please keep [place] updated. I also make it clear to patient he must quarantine until her gets his test results back. He thought he only had to quarantine if he tested positive so I clarified this with him.

## 2019-06-22 NOTE — Assessment & Plan Note (Addendum)
No acute respiratory distress over telephone call.  Patient appears stable.  Concern for influenza versus COVID-19.  As patient is out of window for treatment for influenza, we will not specifically testing for flu however we will pursue COVID-19 testing.  Patient will call and schedule this,  I have advised him also to make an appointment with complete respiratory clinic ahead of his Covid result as well , in the setting of his comorbidities, I am concerned he could have severe disease.  also advised him to hold glipizide while not eating his normal appetite to prevent hypoglycemic episodes.  Encouraged adequate hydration and quarantine.  Given the albuterol for the wheezing.  He states his shortness of breath is chronic at his baseline.  Patient will let us know how he is doing.

## 2019-06-22 NOTE — Progress Notes (Signed)
Verbal consent for services obtained from patient prior to services given to TELEPHONE visit:   Location of call:  provider at work patient at home  Names of all persons present for services: Mable Paris, NP Chief complaint:  Chief complaint of tactile fever, chills and sore throat x 5 days ago, unchanged. Tmax 98.71F. Also endorses nausea and 'heaving,  watery brown diarrhea, once per day.  Congestion, and spitting up phlegm. Concerned he has the flu. No vomiting, cough, CP, decrease in taste.  Chronic SOB, however unchanged. 'wheezing a little bit.'  Has been taking NyQuil without much relief.  Drinking a lot of gatorade . Appetite decreased. Didn't take medications yesterday.   Drive cars for dealership, and has been seeing people . Wearing mask. No known covid positive.  History, background, results pertinent:  COPD, DM Former smoker- never needed an inhaler.  Lives alone; wife passed away 06-15-19 HTN- follows with Dr Clayborn Bigness, last BP 118/64.   A/P/next steps: Problem List Items Addressed This Visit      Other   Chills - Primary    No acute respiratory distress over telephone call.  Patient appears stable.  Concern for influenza versus COVID-19.  As patient is out of window for treatment for influenza, we will not specifically testing for flu however we will pursue COVID-19 testing.  Patient will call and schedule this,  I have advised him also to make an appointment with complete respiratory clinic ahead of his Covid result as well , in the setting of his comorbidities, I am concerned he could have severe disease.  also advised him to hold glipizide while not eating his normal appetite to prevent hypoglycemic episodes.  Encouraged adequate hydration and quarantine.  Given the albuterol for the wheezing.  He states his shortness of breath is chronic at his baseline.  Patient will let us know how he is doing.       Relevant Medications   albuterol (VENTOLIN HFA) 108 (90 Base)  MCG/ACT inhaler   Other Relevant Orders   MyChart COVID-19 home monitoring program   Exposure to COVID-19 virus   Relevant Orders   MyChart COVID-19 home monitoring program       I spent 15 min  discussing plan of care over the phone.

## 2019-06-22 NOTE — Telephone Encounter (Signed)
When you call pt again  Please inform him  okay so spoke with [name] again, we can make Nathaniel Lane an appt at respiratory clinic at [place] ( you can do this) , BEFORE postive covid test.   Does he want [place] to do that?

## 2019-06-22 NOTE — Telephone Encounter (Signed)
Spoke with [name] He needs covid test first Please give him website [email or web address] or  He may text 'COVID' to [phone].

## 2019-06-22 NOTE — Telephone Encounter (Signed)
Call respiratory clinic-  Suspected covid or flu Patient needs test for both ideally.   Meanwhile, we can schedule covid test at least for patient.    [name], can you help with this flow??

## 2019-06-22 NOTE — Telephone Encounter (Signed)
I have provided patient with phone number to call to get tested. When I tried to call patient back to let him know about respiratory clinic but patient didn't answer. I asked to please call us back. I will try him back to make as well.

## 2019-06-23 ENCOUNTER — Ambulatory Visit: Payer: PPO | Attending: Internal Medicine

## 2019-06-23 DIAGNOSIS — Z20828 Contact with and (suspected) exposure to other viral communicable diseases: Secondary | ICD-10-CM | POA: Diagnosis not present

## 2019-06-23 DIAGNOSIS — Z20822 Contact with and (suspected) exposure to covid-19: Secondary | ICD-10-CM

## 2019-06-24 LAB — NOVEL CORONAVIRUS, NAA: SARS-CoV-2, NAA: NOT DETECTED

## 2019-06-25 ENCOUNTER — Telehealth: Payer: Self-pay

## 2019-06-25 NOTE — Telephone Encounter (Signed)
Caller given negative result and verbalized understanding  

## 2019-06-26 ENCOUNTER — Other Ambulatory Visit: Payer: Self-pay

## 2019-06-26 ENCOUNTER — Encounter (INDEPENDENT_AMBULATORY_CARE_PROVIDER_SITE_OTHER): Payer: Self-pay

## 2019-06-26 ENCOUNTER — Encounter: Payer: Self-pay | Admitting: Intensive Care

## 2019-06-26 ENCOUNTER — Emergency Department: Payer: PPO

## 2019-06-26 ENCOUNTER — Emergency Department
Admission: EM | Admit: 2019-06-26 | Discharge: 2019-06-26 | Disposition: A | Discharge status: Home / Self Care | Payer: PPO | Attending: Emergency Medicine | Admitting: Emergency Medicine

## 2019-06-26 DIAGNOSIS — Y998 Other external cause status: Secondary | ICD-10-CM | POA: Insufficient documentation

## 2019-06-26 DIAGNOSIS — Z7982 Long term (current) use of aspirin: Secondary | ICD-10-CM | POA: Insufficient documentation

## 2019-06-26 DIAGNOSIS — Y929 Unspecified place or not applicable: Secondary | ICD-10-CM | POA: Diagnosis not present

## 2019-06-26 DIAGNOSIS — Y939 Activity, unspecified: Secondary | ICD-10-CM | POA: Insufficient documentation

## 2019-06-26 DIAGNOSIS — Z87891 Personal history of nicotine dependence: Secondary | ICD-10-CM | POA: Diagnosis not present

## 2019-06-26 DIAGNOSIS — N183 Chronic kidney disease, stage 3 unspecified: Secondary | ICD-10-CM | POA: Diagnosis not present

## 2019-06-26 DIAGNOSIS — S300XXA Contusion of lower back and pelvis, initial encounter: Secondary | ICD-10-CM | POA: Insufficient documentation

## 2019-06-26 DIAGNOSIS — Z7984 Long term (current) use of oral hypoglycemic drugs: Secondary | ICD-10-CM | POA: Diagnosis not present

## 2019-06-26 DIAGNOSIS — I509 Heart failure, unspecified: Secondary | ICD-10-CM | POA: Insufficient documentation

## 2019-06-26 DIAGNOSIS — E1122 Type 2 diabetes mellitus with diabetic chronic kidney disease: Secondary | ICD-10-CM | POA: Diagnosis not present

## 2019-06-26 DIAGNOSIS — Z79899 Other long term (current) drug therapy: Secondary | ICD-10-CM | POA: Insufficient documentation

## 2019-06-26 DIAGNOSIS — I252 Old myocardial infarction: Secondary | ICD-10-CM | POA: Diagnosis not present

## 2019-06-26 DIAGNOSIS — Z85118 Personal history of other malignant neoplasm of bronchus and lung: Secondary | ICD-10-CM | POA: Insufficient documentation

## 2019-06-26 DIAGNOSIS — M533 Sacrococcygeal disorders, not elsewhere classified: Secondary | ICD-10-CM | POA: Diagnosis not present

## 2019-06-26 DIAGNOSIS — I130 Hypertensive heart and chronic kidney disease with heart failure and stage 1 through stage 4 chronic kidney disease, or unspecified chronic kidney disease: Secondary | ICD-10-CM | POA: Insufficient documentation

## 2019-06-26 DIAGNOSIS — Z951 Presence of aortocoronary bypass graft: Secondary | ICD-10-CM | POA: Insufficient documentation

## 2019-06-26 DIAGNOSIS — W19XXXA Unspecified fall, initial encounter: Secondary | ICD-10-CM | POA: Insufficient documentation

## 2019-06-26 DIAGNOSIS — I2510 Atherosclerotic heart disease of native coronary artery without angina pectoris: Secondary | ICD-10-CM | POA: Diagnosis not present

## 2019-06-26 DIAGNOSIS — S3992XA Unspecified injury of lower back, initial encounter: Secondary | ICD-10-CM | POA: Diagnosis not present

## 2019-06-26 IMAGING — CR DG SACRUM/COCCYX 2+V
3 series · 3 of 3 positions shown · non-contrast
Comparison: None.

CLINICAL DATA: Sacral pain after fall 4 days ago

EXAM:
SACRUM AND COCCYX - 2+ VIEW

[coccyx ap]
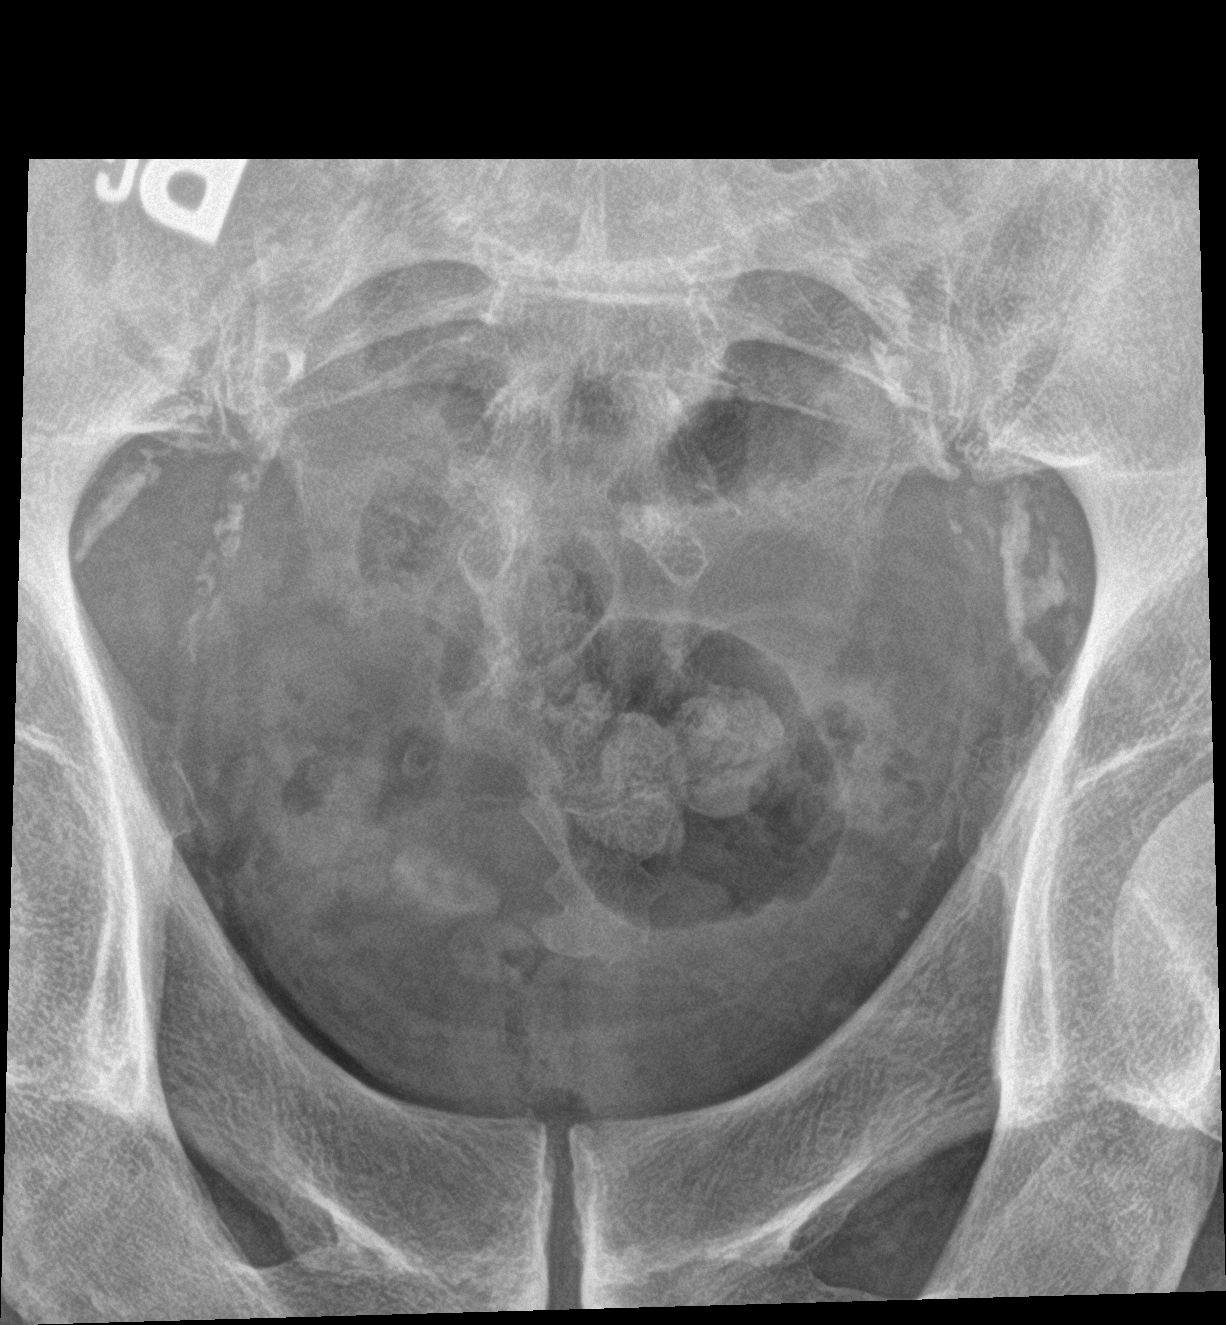

[sacrum ap]
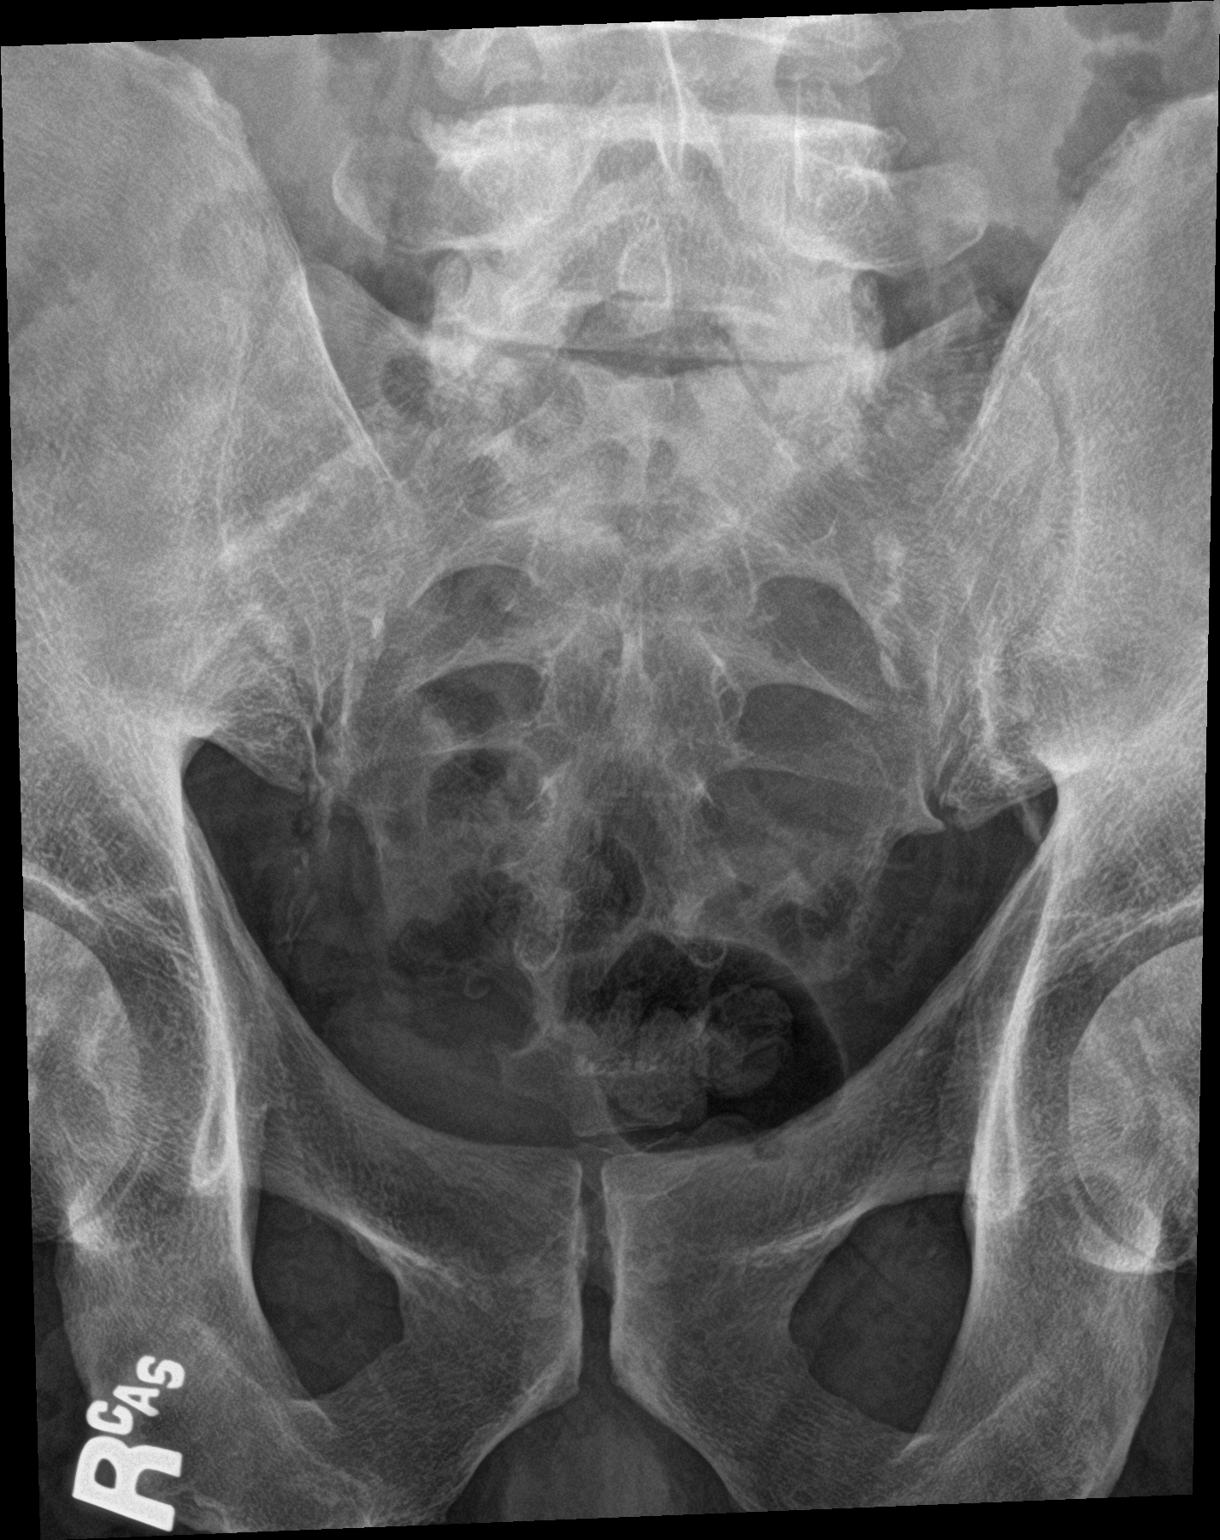

[sacrum lat]
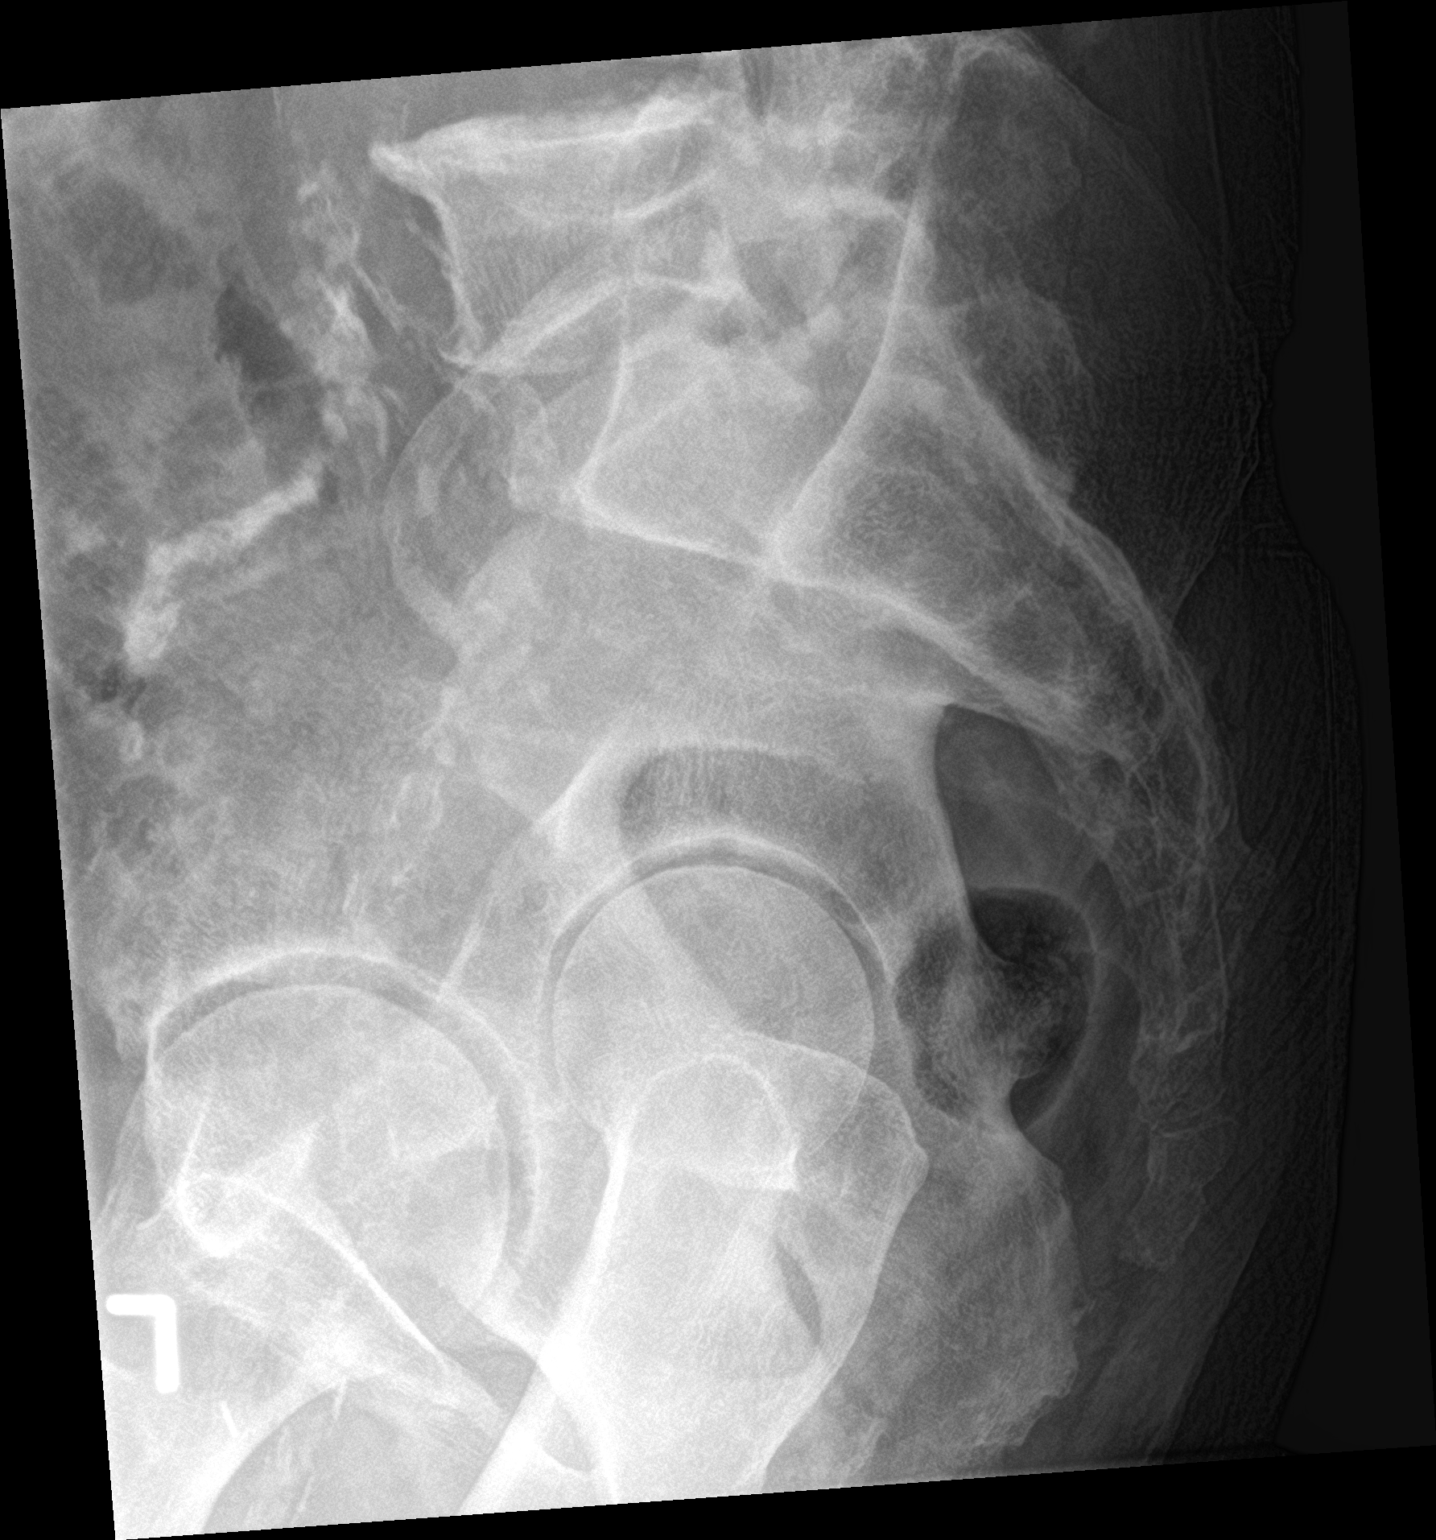

[3 of 3 positions shown; findings below may reference images not displayed]

FINDINGS: There is no evidence of fracture or other focal bone lesions. Sacral
arcuate lines appear intact. SI joints intact without diastasis.
Prominent vascular calcifications.
IMPRESSION: No evidence to suggest acute fracture of the sacrum or coccyx.

## 2019-06-26 MED ORDER — TRAMADOL HCL 50 MG PO TABS: 50.0000 mg | ORAL_TABLET | Freq: Two times a day (BID) | ORAL | 0 refills | Status: DC | PRN

## 2019-06-26 MED ORDER — TRAMADOL HCL 50 MG PO TABS
50.0000 mg | ORAL_TABLET | Freq: Once | ORAL | Status: AC
  Administered 2019-06-26: 50 mg via ORAL
  Filled 2019-06-26: qty 1

## 2019-06-26 MED ORDER — IBUPROFEN 600 MG PO TABS
600.0000 mg | ORAL_TABLET | Freq: Once | ORAL | Status: AC
  Administered 2019-06-26: 12:00:00 600 mg via ORAL
  Filled 2019-06-26: qty 1

## 2019-06-26 NOTE — ED Notes (Signed)
See triage note  States he took a fall on Sunday  Landed on tailbone  Also has had some body aches and flu sx's  Was tested for COVID on Tuesday which was negative

## 2019-06-26 NOTE — ED Notes (Signed)
First Nurse Note: Pt to ED via POV, pt fell and is now having back pain. Pt is in NAD.

## 2019-06-26 NOTE — ED Provider Notes (Signed)
[facility] Emergency Department Provider Note   ____________________________________________   First MD Initiated Contact with Patient 06/26/19 1132     (approximate)  I have reviewed the triage vital signs and the nursing notes.   HISTORY  Chief Complaint Fall and Rectal Pain    HPI Nathaniel Lane is a 74 y.o. male patient complain of coccyx pain secondary to a fall 4 days ago.  Patient denies bladder or bowel dysfunction.  Patient rates his pain as a 9/10.  Patient described pain as "achy".  Patient has no palliative measure for complaint.         Past Medical History:  Diagnosis Date  . Cancer ([place]) 2012   Stage IIA squamous cell Lung Ca  . Diabetes mellitus   . Gout   . Hyperlipidemia   . Hypertension   . Myocardial infarction ([place])   . Thyroid cancer [facility]) 2013   s/p thyroidectomy 2014    Patient Active Problem List   Diagnosis Date Noted  . Chills 06/22/2019  . Exposure to COVID-19 virus 06/22/2019  . Tubular adenoma of colon 08/09/2018  . B12 deficiency 01/30/2018  . CAD (coronary artery disease) 01/28/2017  . Cardiomyopathy ([place]) 01/28/2017  . CHF (congestive heart failure) ([place]) 01/28/2017  . Colon AND PROSTATE CANCER SCREENING  11/03/2016  . Vitamin D deficiency 05/04/2016  . Medicare annual wellness visit, subsequent 10/26/2015  . COPD, mild ([place]) 07/24/2014  . Anxiety state 10/27/2013  . CKD (chronic kidney disease) stage 3, GFR 30-59 ml/min 07/16/2013  . 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 History of thyroid cancer   . History of lung cancer   . CAD (coronary artery disease), autologous vein bypass graft 07/12/2011  . Personal history of pneumonia 07/12/2011  . Type 2 diabetes mellitus, controlled, with renal complications ([place]) 15/17/6160  . Hyperlipidemia   . Hypertension   . Polyp of colon, adenomatous 04/08/2011    Past Surgical History:  Procedure Laterality Date  . COLONOSCOPY N/A 04/25/2016   Procedure: COLONOSCOPY;  Surgeon: Manya Silvas, MD;  Location: [facility] ENDOSCOPY;  Service: Endoscopy;  Laterality: N/A;  Diabetic (oral meds)  . CORONARY ARTERY BYPASS GRAFT  2007  . LUNG LOBECTOMY    . TOTAL THYROIDECTOMY N/A     Prior to Admission medications   Medication Sig Start Date End Date Taking? Authorizing Provider  albuterol (VENTOLIN HFA) 108 (90 Base) MCG/ACT inhaler Inhale 2 puffs into the lungs every 6 (six) hours as needed for wheezing or shortness of breath. 06/22/19   Burnard Hawthorne, FNP  aspirin (ASPIRIN LOW DOSE) 81 MG EC tablet TAKE 1 TABLET (81 MG TOTAL) BY MOUTH DAILY. SWALLOW WHOLE. 04/27/19   Crecencio Mc, MD  atorvastatin (LIPITOR) 20 MG tablet TAKE 1 TABLET BY MOUTH EVERY DAY 01/19/19   Crecencio Mc, MD  blood glucose meter kit and supplies KIT Dispense based on patient and insurance preference. Use up to four times daily as directed. (FOR ICD-10 E11.29). 02/11/18   Crecencio Mc, MD  gabapentin (NEURONTIN) 100 MG capsule TAKE 1 CAPSULE (100 MG TOTAL) BY MOUTH AT BEDTIME. INCREASE DOSE WEEKLY BY 100 MG (1 CAPSULE) 07/07/18   Crecencio Mc, MD  gabapentin (NEURONTIN) 300 MG capsule TAKE 1 CAPSULE BY MOUTH THREE TIMES A DAY Patient not taking: Reported on 06/22/2019 05/13/19   Crecencio Mc, MD  glipiZIDE (GLUCOTROL XL) 5 MG 24 hr tablet TAKE 1 TABLET BY MOUTH EVERY DAY WITH BREAKFAST 05/25/19   Tullo,  Aris Everts, MD  glucose blood (ONE TOUCH ULTRA TEST) test strip USE AS DIRECTED 4 TIMES A DAY 04/01/18   Crecencio Mc, MD  levothyroxine (SYNTHROID, LEVOTHROID) 150 MCG tablet TAKE ONE TABLET BY MOUTH ONCE DAILY BEFORE BREAKFAST 02/05/18   Crecencio Mc, MD  losartan (COZAAR) 50 MG tablet Take 1 tablet (50 mg total) by mouth daily. 03/25/19   Crecencio Mc, MD  metFORMIN (GLUCOPHAGE) 500 MG tablet TAKE 1 TABLET (500 MG TOTAL) BY MOUTH 2 (TWO) TIMES DAILY WITH A MEAL. 03/12/19   Crecencio Mc, MD  omeprazole (PRILOSEC) 40 MG capsule TAKE 1 CAPSULE BY MOUTH  EVERY DAY 01/26/19   Crecencio Mc, MD  [facility] DELICA LANCETS FINE MISC USE AS DIRECTED 4 TIMES A DAY 03/06/18   Crecencio Mc, MD  tamsulosin (FLOMAX) 0.4 MG CAPS capsule TAKE 1 CAPSULE BY MOUTH EVERY DAY 01/29/19   Crecencio Mc, MD  traMADol (ULTRAM) 50 MG tablet Take 1 tablet (50 mg total) by mouth every 12 (twelve) hours as needed. 06/26/19   Sable Feil, PA-C    Allergies Penicillins  Family History  Problem Relation Age of Onset  . Hypertension Other   . Heart disease Father     Social History Social History   Tobacco Use  . Smoking status: Former Smoker    Quit date: 07/15/2005    Years since quitting: 13.9  . Smokeless tobacco: Never Used  Substance Use Topics  . Alcohol use: No  . Drug use: No    Review of Systems Constitutional: No fever/chills Eyes: No visual changes. ENT: No sore throat. Cardiovascular: Denies chest pain. Respiratory: Denies shortness of breath. Gastrointestinal: No abdominal pain.  No nausea, no vomiting.  No diarrhea.  No constipation. Genitourinary: Negative for dysuria. Musculoskeletal: Negative for back pain. Skin: Negative for rash. Neurological: Negative for headaches, focal weakness or numbness. Endocrine:  Diabetes, hypertension, hyperlipidemia, hypothyroidism. Allergic/Immunilogical: Penicillin ____________________________________________   PHYSICAL EXAM:  VITAL SIGNS: ED Triage Vitals  Enc Vitals Group     BP 06/26/19 1127 115/83     Pulse Rate 06/26/19 1127 92     Resp 06/26/19 1127 16     Temp 06/26/19 1127 97.8 F (36.6 C)     Temp Source 06/26/19 1127 Oral     SpO2 06/26/19 1127 94 %     Weight 06/26/19 1128 186 lb (84.4 kg)     Height 06/26/19 1128 5' 11" (1.803 m)     Head Circumference --      Peak Flow --      Pain Score 06/26/19 1128 9     Pain Loc --      Pain Edu? --      Excl. in [place]? --    Constitutional: Alert and oriented. Well appearing and in no acute distress. Cardiovascular: Normal rate,  regular rhythm. Grossly normal heart sounds.  Good peripheral circulation. Respiratory: Normal respiratory effort.  No retractions. Lungs CTAB. Gastrointestinal: Soft and nontender. No distention. No abdominal bruits. No CVA tenderness. Musculoskeletal: No lower extremity tenderness nor edema.  No joint effusions. Neurologic:  Normal speech and language. No gross focal neurologic deficits are appreciated. No gait instability. Skin:  Skin is warm, dry and intact. No rash noted. Psychiatric: Mood and affect are normal. Speech and behavior are normal.  ____________________________________________   LABS (all labs ordered are listed, but only abnormal results are displayed)  Labs Reviewed - No data to display ____________________________________________  EKG  ____________________________________________  RADIOLOGY  ED MD interpretation:    Official radiology report(s): DG Sacrum/Coccyx  Result Date: 06/26/2019 CLINICAL DATA:  Sacral pain after fall 4 days ago EXAM: SACRUM AND COCCYX - 2+ VIEW COMPARISON:  None. FINDINGS: There is no evidence of fracture or other focal bone lesions. Sacral arcuate lines appear intact. SI joints intact without diastasis. Prominent vascular calcifications. IMPRESSION: No evidence to suggest acute fracture of the sacrum or coccyx. Electronically Signed   By: Davina Poke D.O.   On: 06/26/2019 12:15    ____________________________________________   PROCEDURES  Procedure(s) performed (including Critical Care):  Procedures   ____________________________________________   INITIAL IMPRESSION / ASSESSMENT AND PLAN / ED COURSE  As part of my medical decision making, I reviewed the following data within the [place]     Patient presents with tailbone pain secondary to a fall 4 days ago.  Patient had bladder bowel dysfunction.  Discussed negative strep findings with patient.  Physical exam is consistent with coccyx contusion.   Patient given discharge care instruction and advised to purchase a doughnut cushion for sitting and laying for the next 7 to 10 days.  Advised on drug effects of medications.  Follow-up with PCP.    Nathaniel Lane was evaluated in Emergency Department on 06/26/2019 for the symptoms described in the history of present illness. He was evaluated in the context of the global COVID-19 pandemic, which necessitated consideration that the patient might be at risk for infection with the SARS-CoV-2 virus that causes COVID-19. Institutional protocols and algorithms that pertain to the evaluation of patients at risk for COVID-19 are in a state of rapid change based on information released by regulatory bodies including the CDC and federal and state organizations. These policies and algorithms were followed during the patient's care in the ED.       ____________________________________________   FINAL CLINICAL IMPRESSION(S) / ED DIAGNOSES  Final diagnoses:  Coccyx contusion, initial encounter     ED Discharge Orders         Ordered    traMADol (ULTRAM) 50 MG tablet  Every 12 hours PRN     06/26/19 1230           Note:  This document was prepared using Dragon voice recognition software and may include unintentional dictation errors.    Sable Feil, PA-C 06/26/19 1232    Earleen Newport, MD 06/26/19 [phone]

## 2019-06-26 NOTE — Discharge Instructions (Signed)
Follow discharge care instructions and present doughnut cushion to use when sitting or lying down.

## 2019-06-26 NOTE — ED Triage Notes (Signed)
Patient reports falling 06/21/19 and has had buttocks pain since. Ambulatory into triage. A&O x4. No LOC.

## 2019-06-27 ENCOUNTER — Telehealth: Payer: Self-pay

## 2019-06-27 NOTE — Telephone Encounter (Signed)
Advised pt that if diarrhea remains the same, advised pt to drink fluids as tolerated and to slowly work his way to bland foods such as crackers, pretzels, soup, bread or applesauce and boiled starches. Advised pt to call PCP if unable to tolerate any foods or liquids.  Advised pt to call 911 if develops severe vomiting (more than 6 times a day and/or >8 hours) and/or severe abdominal pain. Advised pt to avoid caffeine, alcohol, spicy foods or fatty foods that could make diarrhea worse. Advised pt to monitor for signs of dehydration (increased thirst, decreased urine output, yellow urine, dry mouth, dry skin, headache or dizziness.)  Pt verbalized understanding.

## 2019-06-29 DIAGNOSIS — R0602 Shortness of breath: Secondary | ICD-10-CM | POA: Diagnosis not present

## 2019-06-29 DIAGNOSIS — Z87891 Personal history of nicotine dependence: Secondary | ICD-10-CM | POA: Diagnosis not present

## 2019-06-29 DIAGNOSIS — E785 Hyperlipidemia, unspecified: Secondary | ICD-10-CM | POA: Diagnosis not present

## 2019-06-29 DIAGNOSIS — Z79899 Other long term (current) drug therapy: Secondary | ICD-10-CM | POA: Diagnosis not present

## 2019-06-29 DIAGNOSIS — I2510 Atherosclerotic heart disease of native coronary artery without angina pectoris: Secondary | ICD-10-CM | POA: Diagnosis not present

## 2019-06-29 DIAGNOSIS — R918 Other nonspecific abnormal finding of lung field: Secondary | ICD-10-CM | POA: Diagnosis not present

## 2019-06-29 DIAGNOSIS — K219 Gastro-esophageal reflux disease without esophagitis: Secondary | ICD-10-CM | POA: Diagnosis not present

## 2019-06-29 DIAGNOSIS — I130 Hypertensive heart and chronic kidney disease with heart failure and stage 1 through stage 4 chronic kidney disease, or unspecified chronic kidney disease: Secondary | ICD-10-CM | POA: Diagnosis not present

## 2019-06-29 DIAGNOSIS — J1282 Pneumonia due to coronavirus disease 2019: Secondary | ICD-10-CM | POA: Diagnosis not present

## 2019-06-29 DIAGNOSIS — Z85828 Personal history of other malignant neoplasm of skin: Secondary | ICD-10-CM | POA: Diagnosis not present

## 2019-06-29 DIAGNOSIS — I509 Heart failure, unspecified: Secondary | ICD-10-CM | POA: Diagnosis not present

## 2019-06-29 DIAGNOSIS — R0689 Other abnormalities of breathing: Secondary | ICD-10-CM | POA: Diagnosis not present

## 2019-06-29 DIAGNOSIS — N179 Acute kidney failure, unspecified: Secondary | ICD-10-CM | POA: Diagnosis not present

## 2019-06-29 DIAGNOSIS — E890 Postprocedural hypothyroidism: Secondary | ICD-10-CM | POA: Diagnosis not present

## 2019-06-29 DIAGNOSIS — Z902 Acquired absence of lung [part of]: Secondary | ICD-10-CM | POA: Diagnosis not present

## 2019-06-29 DIAGNOSIS — Z85048 Personal history of other malignant neoplasm of rectum, rectosigmoid junction, and anus: Secondary | ICD-10-CM | POA: Diagnosis not present

## 2019-06-29 DIAGNOSIS — N1832 Chronic kidney disease, stage 3b: Secondary | ICD-10-CM | POA: Diagnosis not present

## 2019-06-29 DIAGNOSIS — E860 Dehydration: Secondary | ICD-10-CM | POA: Diagnosis not present

## 2019-06-29 DIAGNOSIS — J9601 Acute respiratory failure with hypoxia: Secondary | ICD-10-CM | POA: Diagnosis not present

## 2019-06-29 DIAGNOSIS — I429 Cardiomyopathy, unspecified: Secondary | ICD-10-CM | POA: Diagnosis not present

## 2019-06-29 DIAGNOSIS — R0600 Dyspnea, unspecified: Secondary | ICD-10-CM | POA: Diagnosis not present

## 2019-06-29 DIAGNOSIS — F329 Major depressive disorder, single episode, unspecified: Secondary | ICD-10-CM | POA: Diagnosis not present

## 2019-06-29 DIAGNOSIS — Z85118 Personal history of other malignant neoplasm of bronchus and lung: Secondary | ICD-10-CM | POA: Diagnosis not present

## 2019-06-29 DIAGNOSIS — R0902 Hypoxemia: Secondary | ICD-10-CM | POA: Diagnosis not present

## 2019-06-29 DIAGNOSIS — R197 Diarrhea, unspecified: Secondary | ICD-10-CM | POA: Diagnosis not present

## 2019-06-29 DIAGNOSIS — E1122 Type 2 diabetes mellitus with diabetic chronic kidney disease: Secondary | ICD-10-CM | POA: Diagnosis not present

## 2019-06-29 DIAGNOSIS — N400 Enlarged prostate without lower urinary tract symptoms: Secondary | ICD-10-CM | POA: Diagnosis not present

## 2019-06-29 DIAGNOSIS — R000 Tachycardia, unspecified: Secondary | ICD-10-CM | POA: Diagnosis not present

## 2019-06-29 DIAGNOSIS — Z85850 Personal history of malignant neoplasm of thyroid: Secondary | ICD-10-CM | POA: Diagnosis not present

## 2019-06-29 DIAGNOSIS — I10 Essential (primary) hypertension: Secondary | ICD-10-CM | POA: Diagnosis not present

## 2019-06-29 DIAGNOSIS — U071 COVID-19: Secondary | ICD-10-CM | POA: Diagnosis not present

## 2019-06-29 DIAGNOSIS — N183 Chronic kidney disease, stage 3 unspecified: Secondary | ICD-10-CM | POA: Diagnosis not present

## 2019-06-29 DIAGNOSIS — E1140 Type 2 diabetes mellitus with diabetic neuropathy, unspecified: Secondary | ICD-10-CM | POA: Diagnosis not present

## 2019-06-29 DIAGNOSIS — J90 Pleural effusion, not elsewhere classified: Secondary | ICD-10-CM | POA: Diagnosis not present

## 2019-06-29 DIAGNOSIS — Z951 Presence of aortocoronary bypass graft: Secondary | ICD-10-CM | POA: Diagnosis not present

## 2019-07-02 ENCOUNTER — Telehealth: Payer: Self-pay | Admitting: Internal Medicine

## 2019-07-02 MED ORDER — DEXTROSE 50 % IV SOLN
12.50 | INTRAVENOUS | Status: DC
Start: ? — End: 2019-07-02

## 2019-07-02 MED ORDER — GLUCAGON (RDNA) 1 MG IJ KIT
1.00 | PACK | INTRAMUSCULAR | Status: DC
Start: ? — End: 2019-07-02

## 2019-07-02 MED ORDER — DEXAMETHASONE 4 MG PO TABS
6.00 | ORAL_TABLET | ORAL | Status: DC
Start: 2019-07-03 — End: 2019-07-02

## 2019-07-02 MED ORDER — CHOLECALCIFEROL 125 MCG/ML PO LIQD
1000.00 | ORAL | Status: DC
Start: 2019-07-03 — End: 2019-07-02

## 2019-07-02 MED ORDER — ACETAMINOPHEN 325 MG PO TABS
650.00 | ORAL_TABLET | ORAL | Status: DC
Start: ? — End: 2019-07-02

## 2019-07-02 MED ORDER — GENERIC EXTERNAL MEDICATION
100.00 | Status: DC
Start: 2019-07-03 — End: 2019-07-02

## 2019-07-02 MED ORDER — BENZONATATE 100 MG PO CAPS
100.00 | ORAL_CAPSULE | ORAL | Status: DC
Start: 2019-07-02 — End: 2019-07-02

## 2019-07-02 MED ORDER — INSULIN LISPRO 100 UNIT/ML ~~LOC~~ SOLN
0.00 | SUBCUTANEOUS | Status: DC
Start: 2019-07-02 — End: 2019-07-02

## 2019-07-02 MED ORDER — LEVOTHYROXINE SODIUM 75 MCG PO TABS
150.00 | ORAL_TABLET | ORAL | Status: DC
Start: 2019-07-03 — End: 2019-07-02

## 2019-07-02 MED ORDER — PANTOPRAZOLE SODIUM 40 MG PO TBEC
40.00 | DELAYED_RELEASE_TABLET | ORAL | Status: DC
Start: 2019-07-03 — End: 2019-07-02

## 2019-07-02 MED ORDER — ENOXAPARIN SODIUM 40 MG/0.4ML ~~LOC~~ SOLN
40.00 | SUBCUTANEOUS | Status: DC
Start: 2019-07-03 — End: 2019-07-02

## 2019-07-02 MED ORDER — DEXTROMETHORPHAN-GUAIFENESIN 10-100 MG/5ML PO LIQD
10.00 | ORAL | Status: DC
Start: ? — End: 2019-07-02

## 2019-07-02 MED ORDER — ALBUTEROL SULFATE HFA 108 (90 BASE) MCG/ACT IN AERS
2.00 | INHALATION_SPRAY | RESPIRATORY_TRACT | Status: DC
Start: ? — End: 2019-07-02

## 2019-07-02 MED ORDER — GENERIC EXTERNAL MEDICATION
30.00 | Status: DC
Start: 2019-07-03 — End: 2019-07-02

## 2019-07-02 MED ORDER — MELATONIN 3 MG PO TABS
3.00 | ORAL_TABLET | ORAL | Status: DC
Start: ? — End: 2019-07-02

## 2019-07-02 MED ORDER — ATORVASTATIN CALCIUM 20 MG PO TABS
20.00 | ORAL_TABLET | ORAL | Status: DC
Start: 2019-07-03 — End: 2019-07-02

## 2019-07-02 MED ORDER — GABAPENTIN 300 MG PO CAPS
300.00 | ORAL_CAPSULE | ORAL | Status: DC
Start: 2019-07-02 — End: 2019-07-02

## 2019-07-02 MED ORDER — ASPIRIN 81 MG PO TBEC
81.00 | DELAYED_RELEASE_TABLET | ORAL | Status: DC
Start: 2019-07-03 — End: 2019-07-02

## 2019-07-02 MED ORDER — POLYETHYLENE GLYCOL 3350 17 GM/SCOOP PO POWD
17.00 | ORAL | Status: DC
Start: ? — End: 2019-07-02

## 2019-07-02 MED ORDER — LIDOCAINE HCL 1 % IJ SOLN
0.50 | INTRAMUSCULAR | Status: DC
Start: ? — End: 2019-07-02

## 2019-07-02 MED ORDER — TAMSULOSIN HCL 0.4 MG PO CAPS
0.40 | ORAL_CAPSULE | ORAL | Status: DC
Start: 2019-07-03 — End: 2019-07-02

## 2019-07-02 NOTE — Telephone Encounter (Signed)
[facility] called to schedule a HFU for pt he is being discharged today for Covid  Pt has been scheduled for 07/08/19

## 2019-07-02 NOTE — Telephone Encounter (Signed)
Noted. Will follow.  

## 2019-07-03 NOTE — Telephone Encounter (Signed)
Transition Care Management Follow-up Telephone Call  Date of discharge and from where: 07/02/19 [facility] DX COVID   How have you been since you were released from the hospital? Patient stated he felt much better since leaving the hospital that he went out and attained breakfast this morning advised patient on quarantine instructions that he can still pass COVID on money and with touching. Wearing a mask is good but does not guarantee protection.  Any questions or concerns? No   Items Reviewed:  Did the pt receive and understand the discharge instructions provided? Yes   Medications obtained and verified? Yes   Any new allergies since your discharge? Yes   Dietary orders reviewed? Yes  Do you have support at home? Yes   Functional Questionnaire: (I = Independent and D = Dependent) ADLs: Independent  Bathing/Dressing- Independent   Meal Prep- Independent  Eating- Independent  Maintaining continence- Independent  Transferring/Ambulation- Independent  Managing Meds- Independent   Follow up appointments reviewed:   PCP Hospital f/u appt confirmed? Yes  Scheduled to see Dr. Derrel Nip on 07/08/19 @ 11:00.  [facility] f/u appt confirmed? NA Scheduled to see NA  Are transportation arrangements needed? No   If their condition worsens, is the pt aware to call PCP or go to the Emergency Dept.? Yes  Was the patient provided with contact information for the PCP's office or ED? Yes  Was to pt encouraged to call back with questions or concerns? Yes

## 2019-07-06 DIAGNOSIS — R0602 Shortness of breath: Secondary | ICD-10-CM | POA: Diagnosis not present

## 2019-07-08 ENCOUNTER — Encounter: Payer: Self-pay | Admitting: Internal Medicine

## 2019-07-08 ENCOUNTER — Ambulatory Visit (INDEPENDENT_AMBULATORY_CARE_PROVIDER_SITE_OTHER): Payer: PPO | Admitting: Internal Medicine

## 2019-07-08 ENCOUNTER — Other Ambulatory Visit: Payer: Self-pay

## 2019-07-08 DIAGNOSIS — E1121 Type 2 diabetes mellitus with diabetic nephropathy: Secondary | ICD-10-CM | POA: Diagnosis not present

## 2019-07-08 DIAGNOSIS — U071 COVID-19: Secondary | ICD-10-CM

## 2019-07-08 DIAGNOSIS — J9600 Acute respiratory failure, unspecified whether with hypoxia or hypercapnia: Secondary | ICD-10-CM

## 2019-07-08 DIAGNOSIS — N1831 Chronic kidney disease, stage 3a: Secondary | ICD-10-CM

## 2019-07-08 DIAGNOSIS — Z09 Encounter for follow-up examination after completed treatment for conditions other than malignant neoplasm: Secondary | ICD-10-CM

## 2019-07-08 DIAGNOSIS — Z8709 Personal history of other diseases of the respiratory system: Secondary | ICD-10-CM

## 2019-07-08 NOTE — Progress Notes (Addendum)
.  Telephone Note   This visit type was conducted due to national recommendations for restrictions regarding the COVID-19 pandemic (e.g. social distancing).  This format is felt to be most appropriate for this patient at this time.  All issues noted in this document were discussed and addressed.  No physical exam was performed (except for noted visual exam findings with Video Visits).   I connected with@ on 07/08/19 at 11:00 AM EST by  telephone and verified that I am speaking with the correct person using two identifiers. Location patient: home Location provider: work or home office Persons participating in the virtual visit: patient, provider  I discussed the limitations, risks, security and privacy concerns of performing an evaluation and management service by telephone and the availability of in person appointments. I also discussed with the patient that there may be a patient responsible charge related to this service. The patient expressed understanding and agreed to proceed.  Reason for visit: hospital follow up  HPI:  74 yr old ex smoker with history of lung CA s/p RLL lobectomy/chemo in 2013,  mild COPD ,  CAD and T2DM with CKD admitted to [facility] on Jan 4 with acute respiratory failure (02 sats 87%)  due to COVID 19 INFECTION .  Had started feeling bad on dec 27,  But worked on the 4th of January He was treated with remdesivir and steroids,  and Supplemental oxygen, which was weaned prior to discharge on July 02, 2019.   H e has been rnrolled in a trial. His quarantine ends on Jan 14 but he reports that he has been [place]  To get a breakfast sandwich at [facility] drive through.  He has worn a  mask    Appetite large.  . Gets dyspneic if he walks , does not have a pulse oximeter . Has been resting a lot.  Walking to mailbox 5 times daily for exercise    Prior to hospitalization he had a fall and went to [facility] in jan 1 for evaluation of persistent tailbone pain for several   days. Tailbone was bruised  No fractures seen,  Was given tramadol,  Took 2 doses tramadol, but stopped taking it.  But since discharge leg feels heavy if he walks farther than 300 feet.    DM;;  a1c was checked at [facility] on Jan 6 and was elevated at 8.2  BS have not been checked  Since discharge .  Denies any recent hypoglyemic events.  Taking his medications as directed. Following a carbohydrate modified diet 6 days per week. Denies numbness, burning and tingling of extremities. Appetite is good.   Losartan was held at discharge due to AKI.  Cr was at baseline 1 at dis harge.  Not checking BP .  Will check at [facility] tomorrow   Wants to return to work on Monday  jan 18.   Work wants him to be rechecked for covid.     ROS: See pertinent positives and negatives per HPI.  Past Medical History:  Diagnosis Date  . Cancer ([place]) 2012   Stage IIA squamous cell Lung Ca  . Diabetes mellitus   . Gout   . Hyperlipidemia   . Hypertension   . Myocardial infarction ([place])   . Thyroid cancer ([place]) 2013   s/p thyroidectomy 2014    Past Surgical History:  Procedure Laterality Date  . COLONOSCOPY N/A 04/25/2016   Procedure: COLONOSCOPY;  Surgeon: Manya Silvas, MD;  Location: [place];  Service: Endoscopy;  Laterality: N/A;  Diabetic (oral meds)  . CORONARY ARTERY BYPASS GRAFT  2007  . LUNG LOBECTOMY    . TOTAL THYROIDECTOMY N/A     Family History  Problem Relation Age of Onset  . Hypertension Other   . Heart disease Father     SOCIAL HX: his wife died several weeks ago.   reports that he quit smoking about 13 years ago. He has never used smokeless tobacco. He reports that he does not drink alcohol or use drugs.   Current Outpatient Medications:  .  aspirin (ASPIRIN LOW DOSE) 81 MG EC tablet, TAKE 1 TABLET (81 MG TOTAL) BY MOUTH DAILY. SWALLOW WHOLE., Disp: 90 tablet, Rfl: 3 .  atorvastatin (LIPITOR) 20 MG tablet, TAKE 1 TABLET BY MOUTH EVERY DAY, Disp: 90 tablet, Rfl: 1 .  blood  glucose meter kit and supplies KIT, Dispense based on patient and insurance preference. Use up to four times daily as directed. (FOR ICD-10 E11.29)., Disp: 1 each, Rfl: 0 .  gabapentin (NEURONTIN) 100 MG capsule, TAKE 1 CAPSULE (100 MG TOTAL) BY MOUTH AT BEDTIME. INCREASE DOSE WEEKLY BY 100 MG (1 CAPSULE), Disp: 270 capsule, Rfl: 0 .  glipiZIDE (GLUCOTROL XL) 5 MG 24 hr tablet, TAKE 1 TABLET BY MOUTH EVERY DAY WITH BREAKFAST, Disp: 90 tablet, Rfl: 0 .  glucose blood (ONE TOUCH ULTRA TEST) test strip, USE AS DIRECTED 4 TIMES A DAY, Disp: 100 each, Rfl: 2 .  levothyroxine (SYNTHROID, LEVOTHROID) 150 MCG tablet, TAKE ONE TABLET BY MOUTH ONCE DAILY BEFORE BREAKFAST, Disp: 90 tablet, Rfl: 1 .  metFORMIN (GLUCOPHAGE) 500 MG tablet, TAKE 1 TABLET (500 MG TOTAL) BY MOUTH 2 (TWO) TIMES DAILY WITH A MEAL., Disp: 180 tablet, Rfl: 1 .  omeprazole (PRILOSEC) 40 MG capsule, TAKE 1 CAPSULE BY MOUTH EVERY DAY, Disp: 90 capsule, Rfl: 1 .  ONETOUCH DELICA LANCETS FINE MISC, USE AS DIRECTED 4 TIMES A DAY, Disp: 100 each, Rfl: 2 .  tamsulosin (FLOMAX) 0.4 MG CAPS capsule, TAKE 1 CAPSULE BY MOUTH EVERY DAY, Disp: 90 capsule, Rfl: 1 .  traMADol (ULTRAM) 50 MG tablet, Take 1 tablet (50 mg total) by mouth every 12 (twelve) hours as needed., Disp: 12 tablet, Rfl: 0 .  albuterol (VENTOLIN HFA) 108 (90 Base) MCG/ACT inhaler, Inhale 2 puffs into the lungs every 6 (six) hours as needed for wheezing or shortness of breath. (Patient not taking: Reported on 07/08/2019), Disp: 6.7 g, Rfl: 0 .  gabapentin (NEURONTIN) 300 MG capsule, TAKE 1 CAPSULE BY MOUTH THREE TIMES A DAY (Patient not taking: Reported on 06/22/2019), Disp: 270 capsule, Rfl: 2 .  losartan (COZAAR) 50 MG tablet, Take 1 tablet (50 mg total) by mouth daily. (Patient not taking: Reported on 07/08/2019), Disp: 90 tablet, Rfl: 3  EXAM:  VITALS per patient if applicable:  GENERAL: alert, oriented, appears well and in no acute distress  HEENT: atraumatic, conjunttiva  clear, no obvious abnormalities on inspection of external nose and ears  NECK: normal movements of the head and neck  LUNGS: on inspection no signs of respiratory distress, breathing rate appears normal, no obvious gross SOB, gasping or wheezing  CV: no obvious cyanosis  MS: moves all visible extremities without noticeable abnormality  PSYCH/NEURO: pleasant and cooperative, no obvious depression or anxiety, speech and thought processing grossly intact  ASSESSMENT AND PLAN:  Discussed the following assessment and plan:  Hospital discharge follow-up  Controlled type 2 diabetes mellitus with diabetic nephropathy, without long-term current use of insulin (  [place])  Acute respiratory failure due to COVID-19 [facility])  Stage 3a chronic kidney disease  Hospital discharge follow-up Patient is stable post discharge and has no new issues or questions about discharge plans at the visit today for hospital follow up. All labs , imaging studies and progress notes from admission were reviewed with patient today    Type 2 diabetes mellitus, controlled, with renal complications ([place]) Loss of control noted during recent [facility] hospitalization.. he has been asked to record BS readings twice daily for the next two weeks at variable times and submit .  Acute respiratory failure due to COVID-19 [facility]) Symptoms started on DEc 27 but he initially tested negative.  He deteriorated clinically and was admitted to [facility] on Jan 4 with resp failure and positive covid test. . He is recovering well and is able to return to work on Monday  CKD (chronic kidney disease) stage 3, GFR 30-59 ml/min Losartan was held during admission due to AKI.  Advised to resume for SBP > 140    I discussed the assessment and treatment plan with the patient. The patient was provided an opportunity to ask questions and all were answered. The patient agreed with the plan and demonstrated an understanding of the instructions.   The patient was  advised to call back or seek an in-person evaluation if the symptoms worsen or if the condition fails to improve as anticipated.   Crecencio Mc, MD

## 2019-07-09 ENCOUNTER — Telehealth: Payer: Self-pay | Admitting: Internal Medicine

## 2019-07-09 DIAGNOSIS — J9600 Acute respiratory failure, unspecified whether with hypoxia or hypercapnia: Secondary | ICD-10-CM | POA: Insufficient documentation

## 2019-07-09 DIAGNOSIS — Z8616 Personal history of COVID-19: Secondary | ICD-10-CM | POA: Insufficient documentation

## 2019-07-09 DIAGNOSIS — Z09 Encounter for follow-up examination after completed treatment for conditions other than malignant neoplasm: Secondary | ICD-10-CM | POA: Insufficient documentation

## 2019-07-09 NOTE — Telephone Encounter (Signed)
Printed letter and placed in quick sign folder for signature.

## 2019-07-09 NOTE — Assessment & Plan Note (Signed)
Losartan was held during admission due to AKI.  Advised to resume for SBP > 140

## 2019-07-09 NOTE — Assessment & Plan Note (Signed)
Loss of control noted during recent [facility] hospitalization.. he has been asked to record BS readings twice daily for the next two weeks at variable times and submit .

## 2019-07-09 NOTE — Telephone Encounter (Signed)
Pt states that he needs a letter faxed to his job stating that he can go back to work on Saturday. "He wants it to state that he can not get a covid test for a while." Pt states that Dr Derrel Nip was going to mail the letter but pt needs it faxed as well to [phone]. ATTNMerry Proud

## 2019-07-09 NOTE — Assessment & Plan Note (Signed)
Patient is stable post discharge and has no new issues or questions about discharge plans at the visit today for hospital follow up. All labs , imaging studies and progress notes from admission were reviewed with patient today   

## 2019-07-09 NOTE — Assessment & Plan Note (Addendum)
Symptoms started on DEc 27 but he initially tested negative.  He deteriorated clinically and was admitted to [facility] on Jan 4 with resp failure and positive covid test. . He is recovering well and is able to return to work on Monday. His quarantine ends jan 14

## 2019-07-10 ENCOUNTER — Other Ambulatory Visit: Payer: Self-pay | Admitting: Family

## 2019-07-10 DIAGNOSIS — R6883 Chills (without fever): Secondary | ICD-10-CM

## 2019-07-10 NOTE — Telephone Encounter (Signed)
Did you change th return to work date to Saturday? The one I wrote said Monday I think

## 2019-07-10 NOTE — Telephone Encounter (Signed)
Date has been changed.

## 2019-07-10 NOTE — Telephone Encounter (Signed)
Please call patient back about letter for work. Patient states he needs to get back to work asap but has to have letter faxed. [phone]

## 2019-07-10 NOTE — Telephone Encounter (Signed)
Spoke with pt to let him know that we will fax his letter this afternoon as soon as Dr. Derrel Nip comes in the office to sign it. Pt stated that he is not returning to work until Monday.

## 2019-07-15 ENCOUNTER — Other Ambulatory Visit: Payer: Self-pay | Admitting: Internal Medicine

## 2019-07-22 DIAGNOSIS — E890 Postprocedural hypothyroidism: Secondary | ICD-10-CM | POA: Diagnosis not present

## 2019-07-22 DIAGNOSIS — Z85850 Personal history of malignant neoplasm of thyroid: Secondary | ICD-10-CM | POA: Diagnosis not present

## 2019-07-27 ENCOUNTER — Other Ambulatory Visit: Payer: Self-pay | Admitting: Internal Medicine

## 2019-07-30 ENCOUNTER — Ambulatory Visit: Payer: PPO | Admitting: Internal Medicine

## 2019-07-31 ENCOUNTER — Ambulatory Visit: Payer: PPO | Admitting: Internal Medicine

## 2019-07-31 ENCOUNTER — Other Ambulatory Visit: Payer: Self-pay

## 2019-07-31 ENCOUNTER — Ambulatory Visit (INDEPENDENT_AMBULATORY_CARE_PROVIDER_SITE_OTHER): Payer: PPO

## 2019-07-31 VITALS — Ht 71.0 in | Wt 186.0 lb

## 2019-07-31 DIAGNOSIS — Z0000 Encounter for general adult medical examination without abnormal findings: Secondary | ICD-10-CM

## 2019-07-31 NOTE — Progress Notes (Signed)
Subjective:   Nathaniel Lane is a 74 y.o. male who presents for Medicare Annual/Subsequent preventive examination.  Review of Systems:  No ROS.  Medicare Wellness Virtual Visit.  Visual/audio telehealth visit, UTA vital signs.   Ht/Wt provided.  See social history for additional risk factors.   Cardiac Risk Factors include: advanced age (>31mn, >>93women);hypertension;male gender;diabetes mellitus     Objective:    Vitals: Ht '5\' 11"'$  (1.803 m)   Wt 186 lb (84.4 kg)   BMI 25.94 kg/m   Body mass index is 25.94 kg/m.  Advanced Directives 07/31/2019 06/26/2019 07/29/2018 01/28/2018 02/20/2017 01/28/2017 01/19/2016  Does Patient Have a Medical Advance Directive? Yes Yes Yes Yes Yes No No  Type of Advance Directive Healthcare [place] of [place]- -  Does patient want to make changes to medical advance directive? No - Patient declined - No - Patient declined - No - Patient declined - -  [place] of [place]? No - copy requested - No - copy requested - No - copy requested - -  Would patient like information on creating a medical advance directive? - No - Patient declined - - - No - Patient declined No - patient declined information    Tobacco Social History   Tobacco Use  Smoking Status Former Smoker  . Quit date: 07/15/2005  . Years since quitting: 14.0  Smokeless Tobacco Never Used     Counseling given: Not Answered   Clinical Intake:  Pre-visit preparation completed: Yes        Diabetes: Yes(Followed by Endocrinology, Dr. SGabriel Carina  How often do you need to have someone help you when you read instructions, pamphlets, or other written materials from your doctor or pharmacy?: 1 - Never  Interpreter Needed?: No     Past Medical History:  Diagnosis Date  . Cancer ([place] 2012   Stage IIA squamous cell Lung Ca  . Diabetes mellitus   . Gout   . Hyperlipidemia    . Hypertension   . Myocardial infarction ([place]   . 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 COLONOSCOPY N/A 04/25/2016   Procedure: COLONOSCOPY;  Surgeon: RManya Silvas MD;  [facility];  Service: Endoscopy;  Laterality: N/A;  Diabetic (oral meds)  . CORONARY ARTERY BYPASS GRAFT  2007  . LUNG LOBECTOMY    . TOTAL THYROIDECTOMY N/A    Family History  Problem Relation Age of Onset  . Hypertension Other   . Heart disease Father    Social History   Socioeconomic History  . Marital status: Married    Spouse name: Not on file  . Number of children: Not on file  . Years of education: Not on file  . Highest education level: Not on file  Occupational History  . 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 Smoking status: Former Smoker    Quit date: 07/15/2005    Years since quitting: 14.0  . Smokeless tobacco: Never Used  Substance and Sexual Activity  . Alcohol use: No  . Drug use: No  . Sexual activity: Not on file  Other Topics Concern  . Not on file  Social History Narrative   Lives alone; wife passed away 1Jan 02, 2021  Social Determinants of Health   Financial Resource Strain: Low Risk   .  Difficulty of Paying Living Expenses: Not hard at all  Food Insecurity:   . Worried About Charity fundraiser in the Last Year: Not on file  . Ran Out of Food in the Last Year: Not on file  Transportation Needs: No Transportation Needs  . Lack of Transportation (Medical): No  . Lack of Transportation (Non-Medical): No  Physical Activity: Insufficiently Active  . Days of Exercise per Week: 4 days  . Minutes of Exercise per Session: 20 min  Stress: No Stress Concern Present  . Feeling of Stress : Not at all  Social Connections: Unknown  . Frequency of Communication with Friends and Family: More than three times a week  . Frequency of Social Gatherings with Friends and Family: More  than three times a week  . Attends Religious Services: Not on file  . Active Member of Clubs or Organizations: Yes  . Attends Archivist Meetings: More than 4 times per year  . Marital Status: Widowed    Outpatient Encounter Medications as of 07/31/2019  Medication Sig  . aspirin (ASPIRIN LOW DOSE) 81 MG EC tablet TAKE 1 TABLET (81 MG TOTAL) BY MOUTH DAILY. SWALLOW WHOLE.  Marland Kitchen atorvastatin (LIPITOR) 20 MG tablet TAKE 1 TABLET BY MOUTH EVERY DAY  . blood glucose meter kit and supplies KIT Dispense based on patient and insurance preference. Use up to four times daily as directed. (FOR ICD-10 E11.29).  Marland Kitchen gabapentin (NEURONTIN) 100 MG capsule TAKE 1 CAPSULE (100 MG TOTAL) BY MOUTH AT BEDTIME. INCREASE DOSE WEEKLY BY 100 MG (1 CAPSULE)  . gabapentin (NEURONTIN) 300 MG capsule TAKE 1 CAPSULE BY MOUTH THREE TIMES A DAY  . glipiZIDE (GLUCOTROL XL) 5 MG 24 hr tablet TAKE 1 TABLET BY MOUTH EVERY DAY WITH BREAKFAST  . glucose blood (ONE TOUCH ULTRA TEST) test strip USE AS DIRECTED 4 TIMES A DAY  . levothyroxine (SYNTHROID, LEVOTHROID) 150 MCG tablet TAKE ONE TABLET BY MOUTH ONCE DAILY BEFORE BREAKFAST  . metFORMIN (GLUCOPHAGE) 500 MG tablet TAKE 1 TABLET (500 MG TOTAL) BY MOUTH 2 (TWO) TIMES DAILY WITH A MEAL.  Marland Kitchen omeprazole (PRILOSEC) 40 MG capsule TAKE 1 CAPSULE BY MOUTH EVERY DAY  . ONETOUCH DELICA LANCETS FINE MISC USE AS DIRECTED 4 TIMES A DAY  . PROAIR HFA 108 (90 Base) MCG/ACT inhaler TAKE 2 PUFFS BY MOUTH EVERY 6 HOURS AS NEEDED FOR WHEEZE OR SHORTNESS OF BREATH  . tamsulosin (FLOMAX) 0.4 MG CAPS capsule TAKE 1 CAPSULE BY MOUTH EVERY DAY  . traMADol (ULTRAM) 50 MG tablet Take 1 tablet (50 mg total) by mouth every 12 (twelve) hours as needed.  Marland Kitchen losartan (COZAAR) 50 MG tablet Take 1 tablet (50 mg total) by mouth daily. (Patient not taking: Reported on 07/31/2019)   No facility-administered encounter medications on file as of 07/31/2019.    Activities of Daily Living In your present state of  health, do you have any difficulty performing the following activities: 07/31/2019  Hearing? N  Vision? N  Difficulty concentrating or making decisions? N  Walking or climbing stairs? N  Dressing or bathing? N  Doing errands, shopping? N  Preparing Food and eating ? N  Using the Toilet? N  In the past six months, have you accidently leaked urine? N  Do you have problems with loss of bowel control? N  Managing your Medications? N  Managing your Finances? N  Housekeeping or managing your Housekeeping? N  Some recent data might be hidden    Patient Care Team:  Crecencio Mc, MD as PCP - General (Internal Medicine)   Assessment:   This is a routine wellness examination for [place].  Nurse connected with patient 07/31/19 at  9:00 AM EST by a telephone enabled telemedicine application and verified that I am speaking with the correct person using two identifiers. Patient stated full name and DOB. Patient gave permission to continue with virtual visit. Patient's location was at home and Nurse's location was at [place] office.   Patient is alert and oriented x3. Patient denies difficulty focusing or concentrating. Patient likes to read the Bible for brain stimulation.   Health Maintenance Due: -Tdap vaccine- discussed; to be completed with doctor in visit or local pharmacy.   -Hgb A1c- 07/29/18 (6.7) See completed HM at the end of note.   Eye: Visual acuity not assessed. Virtual visit. Followed by their ophthalmologist. Retinopathy.   Dental: UTD    Hearing: Demonstrates normal hearing during visit.  Safety:  Patient feels safe at home- yes Patient does have smoke detectors at home- yes Patient does wear sunscreen or protective clothing when in direct sunlight - yes Patient does wear seat belt when in a moving vehicle - yes Patient drives- yes Adequate lighting in walkways free from debris- yes Grab bars and handrails used as appropriate- yes Ambulates with an assistive device-  no Cell phone on person when ambulating outside of the home- yes  Social: Alcohol intake - no    Smoking history- former   Smokers in home? none Illicit drug use? none  Medication: Taking as directed and without issues.  Self managed - yes   Covid-19: Precautions and sickness symptoms discussed. Wears mask, social distancing, hand hygiene as appropriate.   Activities of Daily Living Patient denies needing assistance with: household chores, feeding themselves, getting from bed to chair, getting to the toilet, bathing/showering, dressing, managing money, or preparing meals.   Discussed the importance of a healthy diet, water intake and the benefits of aerobic exercise.  Physical activity- walking   Diet:  Regular Water: good intake  Other Providers Patient Care Team: Crecencio Mc, MD as PCP - General (Internal Medicine) Exercise Activities and Dietary recommendations Current Exercise Habits: Home exercise routine, Type of exercise: walking, Intensity: Mild  Goals    . Increase physical activity     Stay active. Walk for exercise, moderate pace.       Fall Risk Fall Risk  07/31/2019 07/08/2019 06/22/2019 07/29/2018 02/20/2017  Falls in the past year? 0 0 1 0 No  Number falls in past yr: - - 0 - -  Injury with Fall? - - 0 - -  Follow up Falls evaluation completed Falls evaluation completed Falls evaluation completed - -   Timed Get Up and Go Performed: no, virtual visit  Depression Screen PHQ 2/9 Scores 07/31/2019 07/29/2018 02/20/2017 08/15/2016  PHQ - 2 Score 0 0 0 1  PHQ- 9 Score - - 0 -    Cognitive Function MMSE - Mini Mental State Exam 02/20/2017 11/01/2014  Orientation to time 5 5  Orientation to Place 5 5  Registration 3 3  Attention/ Calculation 5 5  Recall 3 3  Language- name 2 objects 2 2  Language- repeat 1 1  Language- follow 3 step command 3 3  Language- read & follow direction 1 1  Write a sentence 1 1  Copy design 1 1  Total score 30 30     6CIT  Screen 07/29/2018  What Year? 0 points  What  month? 0 points  What time? 0 points  Count back from 20 0 points  Months in reverse 0 points  Repeat phrase 0 points  Total Score 0    Immunization History  Administered Date(s) Administered  . Influenza Split 04/15/2013  . Pneumococcal Conjugate-13 07/15/2013  . Pneumococcal Polysaccharide-23 01/15/2012, 05/06/2017  . Tdap 03/06/2007   Screening Tests Health Maintenance  Topic Date Due  . HEMOGLOBIN A1C  12/08/2019 (Originally 01/27/2019)  . TETANUS/TDAP  07/30/2020 (Originally 03/05/2017)  . FOOT EXAM  08/09/2019  . OPHTHALMOLOGY EXAM  01/05/2020  . COLONOSCOPY  04/25/2021  . Hepatitis C Screening  Completed  . PNA vac Low Risk Adult  Completed  . INFLUENZA VACCINE  Discontinued       Plan:   Keep all routine maintenance appointments.   Medicare Attestation I have personally reviewed: The patient's medical and social history Their use of alcohol, tobacco or illicit drugs Their current medications and supplements The patient's functional ability including ADLs,fall risks, home safety risks, cognitive, and hearing and visual impairment Diet and physical activities Evidence for depression   I have reviewed and discussed with patient certain preventive protocols, quality metrics, and best practice recommendations.     Varney Biles, LPN  08/30/3666

## 2019-07-31 NOTE — Patient Instructions (Addendum)
  [name] , Thank you for taking time to come for your Medicare Wellness Visit. I appreciate your ongoing commitment to your health goals. Please review the following plan we discussed and let me know if I can assist you in the future.   These are the goals we discussed: Goals    . Increase physical activity     Stay active. Walk for exercise, moderate pace.       This is a list of the screening recommended for you and due dates:  Health Maintenance  Topic Date Due  . Hemoglobin A1C  12/08/2019*  . Tetanus Vaccine  07/30/2020*  . Complete foot exam   08/09/2019  . Eye exam for diabetics  01/05/2020  . Colon Cancer Screening  04/25/2021  .  Hepatitis C: One time screening is recommended by Center for Disease Control  (CDC) for  adults born from [birth year] through 1963.   Completed  . Pneumonia vaccines  Completed  . Flu Shot  Discontinued  *Topic was postponed. The date shown is not the original due date.

## 2019-08-03 DIAGNOSIS — E890 Postprocedural hypothyroidism: Secondary | ICD-10-CM | POA: Diagnosis not present

## 2019-08-03 DIAGNOSIS — Z85850 Personal history of malignant neoplasm of thyroid: Secondary | ICD-10-CM | POA: Diagnosis not present

## 2019-08-16 ENCOUNTER — Other Ambulatory Visit: Payer: Self-pay | Admitting: Internal Medicine

## 2019-08-28 ENCOUNTER — Telehealth: Payer: Self-pay | Admitting: Internal Medicine

## 2019-08-28 NOTE — Telephone Encounter (Signed)
Placed in yellow folder.  

## 2019-08-28 NOTE — Telephone Encounter (Signed)
Patient dropped off sugar and Bp readings. Paper is up front in [place]'s color folder.

## 2019-09-01 NOTE — Telephone Encounter (Signed)
I Thank you mr Fletes,  But i cannot interpret the blood sugars if I do not know what KIND  They are (fasting,  Post prandial?)

## 2019-10-09 ENCOUNTER — Other Ambulatory Visit: Payer: Self-pay | Admitting: Internal Medicine

## 2019-10-15 DIAGNOSIS — E119 Type 2 diabetes mellitus without complications: Secondary | ICD-10-CM | POA: Diagnosis not present

## 2019-10-15 DIAGNOSIS — K219 Gastro-esophageal reflux disease without esophagitis: Secondary | ICD-10-CM | POA: Diagnosis not present

## 2019-10-15 DIAGNOSIS — E7849 Other hyperlipidemia: Secondary | ICD-10-CM | POA: Diagnosis not present

## 2019-10-15 DIAGNOSIS — Z951 Presence of aortocoronary bypass graft: Secondary | ICD-10-CM | POA: Diagnosis not present

## 2019-10-15 DIAGNOSIS — I10 Essential (primary) hypertension: Secondary | ICD-10-CM | POA: Diagnosis not present

## 2019-10-15 DIAGNOSIS — U071 COVID-19: Secondary | ICD-10-CM | POA: Diagnosis not present

## 2019-10-15 DIAGNOSIS — I255 Ischemic cardiomyopathy: Secondary | ICD-10-CM | POA: Diagnosis not present

## 2019-10-15 DIAGNOSIS — E890 Postprocedural hypothyroidism: Secondary | ICD-10-CM | POA: Diagnosis not present

## 2019-10-15 DIAGNOSIS — J1282 Pneumonia due to coronavirus disease 2019: Secondary | ICD-10-CM | POA: Diagnosis not present

## 2019-10-15 DIAGNOSIS — I5022 Chronic systolic (congestive) heart failure: Secondary | ICD-10-CM | POA: Diagnosis not present

## 2019-10-15 DIAGNOSIS — I2510 Atherosclerotic heart disease of native coronary artery without angina pectoris: Secondary | ICD-10-CM | POA: Diagnosis not present

## 2019-11-16 ENCOUNTER — Other Ambulatory Visit: Payer: Self-pay

## 2019-11-18 ENCOUNTER — Ambulatory Visit (INDEPENDENT_AMBULATORY_CARE_PROVIDER_SITE_OTHER): Payer: PPO | Admitting: Internal Medicine

## 2019-11-18 ENCOUNTER — Encounter: Payer: Self-pay | Admitting: Internal Medicine

## 2019-11-18 ENCOUNTER — Other Ambulatory Visit: Payer: Self-pay

## 2019-11-18 VITALS — BP 118/64 | HR 80 | Temp 97.5°F | Resp 15 | Ht 71.0 in | Wt 182.6 lb

## 2019-11-18 DIAGNOSIS — I10 Essential (primary) hypertension: Secondary | ICD-10-CM | POA: Diagnosis not present

## 2019-11-18 DIAGNOSIS — N1831 Chronic kidney disease, stage 3a: Secondary | ICD-10-CM

## 2019-11-18 DIAGNOSIS — Z125 Encounter for screening for malignant neoplasm of prostate: Secondary | ICD-10-CM

## 2019-11-18 DIAGNOSIS — Z0000 Encounter for general adult medical examination without abnormal findings: Secondary | ICD-10-CM

## 2019-11-18 DIAGNOSIS — E1121 Type 2 diabetes mellitus with diabetic nephropathy: Secondary | ICD-10-CM

## 2019-11-18 DIAGNOSIS — E890 Postprocedural hypothyroidism: Secondary | ICD-10-CM | POA: Diagnosis not present

## 2019-11-18 DIAGNOSIS — Z8616 Personal history of COVID-19: Secondary | ICD-10-CM

## 2019-11-18 DIAGNOSIS — E782 Mixed hyperlipidemia: Secondary | ICD-10-CM

## 2019-11-18 LAB — POCT GLYCOSYLATED HEMOGLOBIN (HGB A1C): Hemoglobin A1C: 7.8 % — AB (ref 4.0–5.6)

## 2019-11-18 LAB — GLUCOSE, POCT (MANUAL RESULT ENTRY): POC Glucose: 251 mg/dl — AB (ref 70–99)

## 2019-11-18 NOTE — Patient Instructions (Signed)
Your sugars are running too high.   Increase your glipizide to 10 mg daily to get better control of your diabetes  If it causes you to have any low blood sugars ( Less than 80),  Reduce your dose to 5 mg AND LET ME KNOW   The blood tests today will help [place] decide when you should get the [place] Maintenance After Age 72 After age 36, you are at a higher risk for certain long-term diseases and infections as well as injuries from falls. Falls are a major cause of broken bones and head injuries in people who are older than age 64. Getting regular preventive care can help to keep you healthy and well. Preventive care includes getting regular testing and making lifestyle changes as recommended by your health care provider. Talk with your health care provider about:  Which screenings and tests you should have. A screening is a test that checks for a disease when you have no symptoms.  A diet and exercise plan that is right for you. What should I know about screenings and tests to prevent falls? Screening and testing are the best ways to find a health problem early. Early diagnosis and treatment give you the best chance of managing medical conditions that are common after age 30. Certain conditions and lifestyle choices may make you more likely to have a fall. Your health care provider may recommend:  Regular vision checks. Poor vision and conditions such as cataracts can make you more likely to have a fall. If you wear glasses, make sure to get your prescription updated if your vision changes.  Medicine review. Work with your health care provider to regularly review all of the medicines you are taking, including over-the-counter medicines. Ask your health care provider about any side effects that may make you more likely to have a fall. Tell your health care provider if any medicines that you take make you feel dizzy or sleepy.  Osteoporosis screening. Osteoporosis is a condition that  causes the bones to get weaker. This can make the bones weak and cause them to break more easily.  Blood pressure screening. Blood pressure changes and medicines to control blood pressure can make you feel dizzy.  Strength and balance checks. Your health care provider may recommend certain tests to check your strength and balance while standing, walking, or changing positions.  Foot health exam. Foot pain and numbness, as well as not wearing proper footwear, can make you more likely to have a fall.  Depression screening. You may be more likely to have a fall if you have a fear of falling, feel emotionally low, or feel unable to do activities that you used to do.  Alcohol use screening. Using too much alcohol can affect your balance and may make you more likely to have a fall. What actions can I take to lower my risk of falls? General instructions  Talk with your health care provider about your risks for falling. Tell your health care provider if: ? You fall. Be sure to tell your health care provider about all falls, even ones that seem minor. ? You feel dizzy, sleepy, or off-balance.  Take over-the-counter and prescription medicines only as told by your health care provider. These include any supplements.  Eat a healthy diet and maintain a healthy weight. A healthy diet includes low-fat dairy products, low-fat (lean) meats, and fiber from whole grains, beans, and lots of fruits and vegetables. Home safety  Remove  any tripping hazards, such as rugs, cords, and clutter.  Install safety equipment such as grab bars in bathrooms and safety rails on stairs.  Keep rooms and walkways well-lit. Activity   Follow a regular exercise program to stay fit. This will help you maintain your balance. Ask your health care provider what types of exercise are appropriate for you.  If you need a cane or walker, use it as recommended by your health care provider.  Wear supportive shoes that have nonskid  soles. Lifestyle  Do not drink alcohol if your health care provider tells you not to drink.  If you drink alcohol, limit how much you have: ? 0-1 drink a day for women. ? 0-2 drinks a day for men.  Be aware of how much alcohol is in your drink. In the [place] one drink equals one typical bottle of beer (12 oz), one-half glass of wine (5 oz), or one shot of hard liquor (1 oz).  Do not use any products that contain nicotine or tobacco, such as cigarettes and e-cigarettes. If you need help quitting, ask your health care provider. Summary  Having a healthy lifestyle and getting preventive care can help to protect your health and wellness after age 48.  Screening and testing are the best way to find a health problem early and help you avoid having a fall. Early diagnosis and treatment give you the best chance for managing medical conditions that are more common for people who are older than age 57.  Falls are a major cause of broken bones and head injuries in people who are older than age 60. Take precautions to prevent a fall at home.  Work with your health care provider to learn what changes you can make to improve your health and wellness and to prevent falls. This information is not intended to replace advice given to you by your health care provider. Make sure you discuss any questions you have with your health care provider. Document Revised: 10/02/2018 Document Reviewed: 04/24/2017 Elsevier Patient Education  2020 [employer].

## 2019-11-18 NOTE — Progress Notes (Addendum)
[name], male    DOB: 08-14-1945  Age: 74 y.o. MRN: [id number]  The patient is here for annual preventive  examination and management of other chronic and acute problems.   The risk factors are reflected in the social history.  The roster of all physicians providing medical care to patient - is listed in the Snapshot section of the chart.  Activities of daily living:  The patient is 100% independent in all ADLs: dressing, toileting, feeding as well as independent mobility  Home safety : The patient has smoke detectors in the home. They wear seatbelts.  There are no firearms at home. There is no violence in the home.   There is no risks for hepatitis, STDs or HIV. There is no   history of blood transfusion. They have no travel history to infectious disease endemic areas of the world.  The patient has seen their dentist in the last six month. They have seen their eye doctor in the last year. They admit to slight hearing difficulty with regard to whispered voices and some television programs.  They have deferred audiologic testing in the last year.  They do not  have excessive sun exposure. Discussed the need for sun protection: hats, long sleeves and use of sunscreen if there is significant sun exposure.   Diet: the importance of a healthy diet is discussed. They do have a healthy diet.  The benefits of regular aerobic exercise were discussed. He is not exercising regularly.  Depression screen: there are no signs or vegative symptoms of depression- irritability, change in appetite, anhedonia, sadness/tearfullness.  Cognitive assessment: the patient manages all their financial and personal affairs and is actively engaged. They could relate day,date,year and events; recalled 2/3 objects at 3 minutes; performed clock-face test normally.  The following portions of the patient's history were reviewed and updated as appropriate: allergies, current medications, past family history,  past medical history,  past surgical history, past social history  and problem list.  Visual acuity was not assessed per patient preference since she has regular follow up with her ophthalmologist. Hearing and body mass index were assessed and reviewed.   During the course of the visit the patient was educated and counseled about appropriate screening and preventive services including : fall prevention , diabetes screening, nutrition counseling, colorectal cancer screening, and recommended immunizations.    CC: The primary encounter diagnosis was History of COVID-19. Diagnoses of Controlled type 2 diabetes mellitus with diabetic nephropathy, without long-term current use of insulin ([place]), Prostate cancer screening, Stage 3a chronic kidney disease, Essential hypertension, Mixed hyperlipidemia, Postsurgical hypothyroidism, and Encounter for preventive health examination were also pertinent to this visit.  Wife died in 06-25-2023.    1) Had a fecal incontinence accident on Dec 25 , didn't feel good for 2 weeks before being diagnosed with COVID INFECTION requiring hospitalization  At [facility] . Still enrolled in a research study for COVID treatment   2 ) Type 2 DM: He feels generally well, is not exercising and working full time.  He is  checking blood sugars once daily at variable times.  BS have been under 130 fasting to 200 fasting .  Denies any recent hypoglyemic events.  Taking his medications as directed. Following a carbohydrate modified diet about 50% of the time .  Denies numbness, burning and tingling of extremities. Appetite is good.   Post prandial sugar today 251  POC A1c 7.8   History Caulin has a past medical history of  Cancer ([place]) (2012), Diabetes mellitus, Gout, Hyperlipidemia, Hypertension, Myocardial infarction [facility]), and Thyroid cancer ([place]) (2013).   He has a past surgical history that includes Coronary artery bypass graft (2007); Lung lobectomy; Total thyroidectomy (N/A); and Colonoscopy  (N/A, 04/25/2016).   His family history includes Heart disease in his father; Hypertension in an other family member.He reports that he quit smoking about 14 years ago. He has never used smokeless tobacco. He reports that he does not drink alcohol or use drugs.  Outpatient Medications Prior to Visit  Medication Sig Dispense Refill  . aspirin (ASPIRIN LOW DOSE) 81 MG EC tablet TAKE 1 TABLET (81 MG TOTAL) BY MOUTH DAILY. SWALLOW WHOLE. 90 tablet 3  . atorvastatin (LIPITOR) 20 MG tablet TAKE 1 TABLET BY MOUTH EVERY DAY 90 tablet 1  . blood glucose meter kit and supplies KIT Dispense based on patient and insurance preference. Use up to four times daily as directed. (FOR ICD-10 E11.29). 1 each 0  . gabapentin (NEURONTIN) 300 MG capsule TAKE 1 CAPSULE BY MOUTH THREE TIMES A DAY 270 capsule 2  . glipiZIDE (GLUCOTROL XL) 5 MG 24 hr tablet TAKE 1 TABLET BY MOUTH EVERY DAY WITH BREAKFAST 90 tablet 1  . levothyroxine (SYNTHROID, LEVOTHROID) 150 MCG tablet TAKE ONE TABLET BY MOUTH ONCE DAILY BEFORE BREAKFAST 90 tablet 1  . losartan (COZAAR) 50 MG tablet Take 1 tablet (50 mg total) by mouth daily. 90 tablet 3  . metFORMIN (GLUCOPHAGE) 500 MG tablet TAKE 1 TABLET (500 MG TOTAL) BY MOUTH 2 (TWO) TIMES DAILY WITH A MEAL. 180 tablet 1  . omeprazole (PRILOSEC) 40 MG capsule TAKE 1 CAPSULE BY MOUTH EVERY DAY 90 capsule 1  . ONETOUCH DELICA LANCETS FINE MISC USE AS DIRECTED 4 TIMES A DAY 100 each 2  . ONETOUCH ULTRA test strip USE AS DIRECTED 4 TIMES A DAY 100 strip 2  . PROAIR HFA 108 (90 Base) MCG/ACT inhaler TAKE 2 PUFFS BY MOUTH EVERY 6 HOURS AS NEEDED FOR WHEEZE OR SHORTNESS OF BREATH 18 g 0  . tamsulosin (FLOMAX) 0.4 MG CAPS capsule TAKE 1 CAPSULE BY MOUTH EVERY DAY 90 capsule 1  . traMADol (ULTRAM) 50 MG tablet Take 1 tablet (50 mg total) by mouth every 12 (twelve) hours as needed. 12 tablet 0  . gabapentin (NEURONTIN) 100 MG capsule TAKE 1 CAPSULE (100 MG TOTAL) BY MOUTH AT BEDTIME. INCREASE DOSE WEEKLY BY  100 MG (1 CAPSULE) (Patient not taking: Reported on 11/18/2019) 270 capsule 0   No facility-administered medications prior to visit.    Review of Systems   Patient denies headache, fevers, malaise, unintentional weight loss, skin rash, eye pain, sinus congestion and sinus pain, sore throat, dysphagia,  hemoptysis , cough, dyspnea, wheezing, chest pain, palpitations, orthopnea, edema, abdominal pain, nausea, melena, diarrhea, constipation, flank pain, dysuria, hematuria, urinary  Frequency, nocturia, numbness, tingling, seizures,  Focal weakness, Loss of consciousness,  Tremor, insomnia, depression, anxiety, and suicidal ideation.     Objective:  BP 118/64 (BP Location: Left Arm, Patient Position: Sitting, Cuff Size: Normal)   Pulse 80   Temp (!) 97.5 F (36.4 C) (Temporal)   Resp 15   Ht '5\' 11"'$  (1.803 m)   Wt 182 lb 9.6 oz (82.8 kg)   SpO2 94%   BMI 25.47 kg/m   Physical Exam   General appearance: alert, cooperative and appears stated age Ears: normal TM's and external ear canals both ears Throat: lips, mucosa, and tongue normal; teeth and gums normal Neck: no adenopathy,  no carotid bruit, supple, symmetrical, trachea midline and thyroid not enlarged, symmetric, no tenderness/mass/nodules Back: symmetric, no curvature. ROM normal. No CVA tenderness. Lungs: clear to auscultation bilaterally Heart: regular rate and rhythm, S1, S2 normal, no murmur, click, rub or gallop Abdomen: soft, non-tender; bowel sounds normal; no masses,  no organomegaly Pulses: 2+ and symmetric Skin: Skin color, texture, turgor normal. No rashes or lesions Lymph nodes: Cervical, supraclavicular, and axillary nodes normal.    Assessment & Plan:   Problem List Items Addressed This Visit      Unprioritized   CKD (chronic kidney disease) stage 3, GFR 30-59 ml/min    Renal function has been stable on ACE INhibitor,  He is avoiding NSAIDs and nephrotoxic agents.   Lab Results  Component Value Date    CREATININE 1.84 (H) 11/18/2019  . Lab Results  Component Value Date   MICROALBUR 4.1 (H) 11/18/2019              Encounter for preventive health examination    age appropriate education and counseling updated, referrals for preventative services and immunizations addressed, dietary and smoking counseling addressed, most recent labs reviewed.  I have personally reviewed and have noted:  1) the patient's medical and social history 2) The pt's use of alcohol, tobacco, and illicit drugs 3) The patient's current medications and supplements 4) Functional ability including ADL's, fall risk, home safety risk, hearing and visual impairment 5) Diet and physical activities 6) Evidence for depression or mood disorder 7) The patient's height, weight, and BMI have been recorded in the chart  I have made referrals, and provided counseling and education based on review of the above      Hyperlipidemia    Triglycerides are elevated today to over 300.  Recommended a low glycemic index diet, weight  loss and regular exercise.  Advised t o  repeat in 6 months.   Lab Results  Component Value Date   CHOL 168 11/18/2019   HDL 28.00 (L) 11/18/2019   LDLCALC 79 12/12/2016   LDLDIRECT 96.0 11/18/2019   TRIG 336.0 (H) 11/18/2019   CHOLHDL 6 11/18/2019         Relevant Orders   LDL cholesterol, direct (Completed)   Hypertension    Well controlled on current regimen. Renal function stable, no changes today.      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 No recurrenc, TSH is at goal       Type 2 diabetes mellitus, controlled, with renal complications (HCC)    Loss of control noted.  Taking glipizide XL 5 mg and metformin 500 mg bid . Patient asked to submit sugars for evaluation       Relevant Orders   POCT Glucose (CBG) (Completed)   POCT HgB A1C (Completed)   Comprehensive metabolic panel (Completed)   Lipid  panel (Completed)   Microalbumin / creatinine urine ratio (Completed)    Other Visit Diagnoses    History of COVID-19    -  Primary   Relevant Orders   SAR CoV2 Serology (COVID 19)AB(IGG)IA (Completed)   Prostate cancer screening       Relevant Orders   PSA, Medicare (Completed)      I am having Yaziel G. Mairena maintain his levothyroxine, blood glucose meter kit and supplies, OneTouch Delica Lancets Fine, omeprazole, metFORMIN, losartan, aspirin, gabapentin, traMADol, ProAir HFA, atorvastatin, tamsulosin, glipiZIDE, and OneTouch Ultra.  No orders of the defined types were placed  in this encounter.   Medications Discontinued During This Encounter  Medication Reason  . gabapentin (NEURONTIN) 100 MG capsule Change in therapy    Follow-up: Return in about 3 months (around 02/18/2020) for follow up diabetes.   Crecencio Mc, MD

## 2019-11-19 LAB — LIPID PANEL
Cholesterol: 168 mg/dL (ref 0–200)
HDL: 28 mg/dL — ABNORMAL LOW (ref >39.00)
NonHDL: 140.16
Total CHOL/HDL Ratio: 6
Triglycerides: 336 mg/dL — ABNORMAL HIGH (ref 0.0–149.0)
VLDL: 67.2 mg/dL — ABNORMAL HIGH (ref 0.0–40.0)

## 2019-11-19 LAB — COMPREHENSIVE METABOLIC PANEL
ALT: 21 U/L (ref 0–53)
AST: 17 U/L (ref 0–37)
Albumin: 4 g/dL (ref 3.5–5.2)
Alkaline Phosphatase: 96 U/L (ref 39–117)
BUN: 32 mg/dL — ABNORMAL HIGH (ref 6–23)
CO2: 25 mEq/L (ref 19–32)
Calcium: 9.1 mg/dL (ref 8.4–10.5)
Chloride: 102 mEq/L (ref 96–112)
Creatinine, Ser: 1.84 mg/dL — ABNORMAL HIGH (ref 0.40–1.50)
GFR: 36.35 mL/min — ABNORMAL LOW (ref >60.00)
Glucose, Bld: 240 mg/dL — ABNORMAL HIGH (ref 70–99)
Potassium: 4.8 mEq/L (ref 3.5–5.1)
Sodium: 134 mEq/L — ABNORMAL LOW (ref 135–145)
Total Bilirubin: 0.4 mg/dL (ref 0.2–1.2)
Total Protein: 6.8 g/dL (ref 6.0–8.3)

## 2019-11-19 LAB — MICROALBUMIN / CREATININE URINE RATIO
Creatinine,U: 141.3 mg/dL
Microalb Creat Ratio: 2.9 mg/g (ref 0.0–30.0)
Microalb, Ur: 4.1 mg/dL — ABNORMAL HIGH (ref 0.0–1.9)

## 2019-11-19 LAB — PSA, MEDICARE: PSA: 1.14 ng/ml (ref 0.10–4.00)

## 2019-11-19 LAB — SAR COV2 SEROLOGY (COVID19)AB(IGG),IA: DiaSorin SARS-CoV-2 Ab, IgG: POSITIVE

## 2019-11-19 LAB — LDL CHOLESTEROL, DIRECT: Direct LDL: 96 mg/dL

## 2019-11-19 NOTE — Assessment & Plan Note (Signed)
Triglycerides are elevated today to over 300.  Recommended a low glycemic index diet, weight  loss and regular exercise.  Advised t o  repeat in 6 months.   Lab Results  Component Value Date   CHOL 168 11/18/2019   HDL 28.00 (L) 11/18/2019   LDLCALC 79 12/12/2016   LDLDIRECT 96.0 11/18/2019   TRIG 336.0 (H) 11/18/2019   CHOLHDL 6 11/18/2019

## 2019-11-19 NOTE — Assessment & Plan Note (Signed)
Loss of control noted.  Taking glipizide XL 5 mg and metformin 500 mg bid . Patient asked to submit sugars for evaluation

## 2019-11-19 NOTE — Assessment & Plan Note (Signed)
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 No recurrenc, TSH is at goal

## 2019-11-19 NOTE — Assessment & Plan Note (Signed)
Well controlled on current regimen. Renal function stable, no changes today. 

## 2019-11-19 NOTE — Assessment & Plan Note (Signed)
Renal function has been stable on ACE INhibitor,  He is avoiding NSAIDs and nephrotoxic agents.   Lab Results  Component Value Date   CREATININE 1.84 (H) 11/18/2019  . Lab Results  Component Value Date   MICROALBUR 4.1 (H) 11/18/2019

## 2019-12-02 DIAGNOSIS — Z0000 Encounter for general adult medical examination without abnormal findings: Secondary | ICD-10-CM | POA: Insufficient documentation

## 2019-12-02 NOTE — Assessment & Plan Note (Signed)

## 2019-12-20 ENCOUNTER — Other Ambulatory Visit: Payer: Self-pay | Admitting: Internal Medicine

## 2020-01-07 ENCOUNTER — Other Ambulatory Visit: Payer: Self-pay | Admitting: Internal Medicine

## 2020-01-11 LAB — HM DIABETES EYE EXAM

## 2020-01-13 ENCOUNTER — Other Ambulatory Visit: Payer: Self-pay | Admitting: Internal Medicine

## 2020-01-19 ENCOUNTER — Other Ambulatory Visit: Payer: Self-pay | Admitting: Internal Medicine

## 2020-01-24 ENCOUNTER — Other Ambulatory Visit: Payer: Self-pay | Admitting: Internal Medicine

## 2020-01-27 DIAGNOSIS — E890 Postprocedural hypothyroidism: Secondary | ICD-10-CM | POA: Diagnosis not present

## 2020-01-27 DIAGNOSIS — Z85850 Personal history of malignant neoplasm of thyroid: Secondary | ICD-10-CM | POA: Diagnosis not present

## 2020-02-01 DIAGNOSIS — Z85850 Personal history of malignant neoplasm of thyroid: Secondary | ICD-10-CM | POA: Diagnosis not present

## 2020-02-01 DIAGNOSIS — E890 Postprocedural hypothyroidism: Secondary | ICD-10-CM | POA: Diagnosis not present

## 2020-02-19 ENCOUNTER — Telehealth (INDEPENDENT_AMBULATORY_CARE_PROVIDER_SITE_OTHER): Payer: PPO | Admitting: Internal Medicine

## 2020-02-19 ENCOUNTER — Encounter: Payer: Self-pay | Admitting: Internal Medicine

## 2020-02-19 VITALS — BP 120/80 | Ht 71.0 in | Wt 182.0 lb

## 2020-02-19 DIAGNOSIS — N1831 Chronic kidney disease, stage 3a: Secondary | ICD-10-CM | POA: Diagnosis not present

## 2020-02-19 DIAGNOSIS — Z8616 Personal history of COVID-19: Secondary | ICD-10-CM

## 2020-02-19 DIAGNOSIS — E1121 Type 2 diabetes mellitus with diabetic nephropathy: Secondary | ICD-10-CM

## 2020-02-19 DIAGNOSIS — I10 Essential (primary) hypertension: Secondary | ICD-10-CM | POA: Diagnosis not present

## 2020-02-19 DIAGNOSIS — E782 Mixed hyperlipidemia: Secondary | ICD-10-CM | POA: Diagnosis not present

## 2020-02-19 NOTE — Progress Notes (Signed)
Telephone Note  This visit type was conducted due to national recommendations for restrictions regarding the COVID-19 pandemic (e.g. social distancing).  This format is felt to be most appropriate for this patient at this time.  All issues noted in this document were discussed and addressed.  No physical exam was performed (except for noted visual exam findings with Video Visits).   I connected with@ on 02/19/20 at  4:00 PM EDT by  telephone and verified that I am speaking with the correct person using two identifiers. Location patient: home Location provider: work or home office Persons participating in the virtual visit: patient, provider  I discussed the limitations, risks, security and privacy concerns of performing an evaluation and management service by telephone and the availability of in person appointments. I also discussed with the patient that there may be a patient responsible charge related to this service. The patient expressed understanding and agreed to proceed.  Reason for visit:   HPI:  74 yr old male with type 2 DM , COPD, history of lung CA s/p lung resection,  ischemic cardiomyopathy, presents for follow up on multiple issues    T2DM:  he  feels generally well,  But is not  exercising regularly or trying to lose weight. Checking  blood sugars less than once daily at variable times, usually only if hhe feels she may be having a hypoglycemic event. .  BS have been under 130 fasting and < 150 post prandially.  Denies any recent hypoglyemic events.  Taking   medications as directed. Following a carbohydrate modified diet 6 days per week. Denies numbness, burning and tingling of extremities. Appetite is good.   COPD:  He notes occasional dyspnea with exertion,  But not at rest.  He is not  exercising regularly,  And has not used an inhaler in over 6 months   Does not check  BP   . Not taking NSAIDs, taking all meds regularly.   ROS: See pertinent positives and negatives per  HPI.  Past Medical History:  Diagnosis Date  . Cancer ([place]) 2012   Stage IIA squamous cell Lung Ca  . Diabetes mellitus   . Gout   . Hyperlipidemia   . Hypertension   . Myocardial infarction ([place])   . Thyroid cancer ([place]) 2013   s/p thyroidectomy 2014    Past Surgical History:  Procedure Laterality Date  . COLONOSCOPY N/A 04/25/2016   Procedure: COLONOSCOPY;  Surgeon: Manya Silvas, MD;  Location: [facility] ENDOSCOPY;  Service: Endoscopy;  Laterality: N/A;  Diabetic (oral meds)  . CORONARY ARTERY BYPASS GRAFT  2007  . LUNG LOBECTOMY    . TOTAL THYROIDECTOMY N/A     Family History  Problem Relation Age of Onset  . Hypertension Other   . Heart disease Father     SOCIAL HX:  reports that he quit smoking about 14 years ago. He has never used smokeless tobacco. He reports that he does not drink alcohol and does not use drugs.  Current Outpatient Medications:  .  aspirin 81 MG EC tablet, TAKE 1 TABLET (81 MG TOTAL) BY MOUTH DAILY. SWALLOW WHOLE., Disp: 90 tablet, Rfl: 3 .  atorvastatin (LIPITOR) 20 MG tablet, TAKE 1 TABLET BY MOUTH EVERY DAY, Disp: 90 tablet, Rfl: 1 .  blood glucose meter kit and supplies KIT, Dispense based on patient and insurance preference. Use up to four times daily as directed. (FOR ICD-10 E11.29)., Disp: 1 each, Rfl: 0 .  gabapentin (NEURONTIN) 300 MG capsule,  TAKE 1 CAPSULE BY MOUTH THREE TIMES A DAY, Disp: 270 capsule, Rfl: 2 .  glipiZIDE (GLUCOTROL XL) 5 MG 24 hr tablet, TAKE 1 TABLET BY MOUTH EVERY DAY WITH BREAKFAST, Disp: 90 tablet, Rfl: 1 .  levothyroxine (SYNTHROID, LEVOTHROID) 150 MCG tablet, TAKE ONE TABLET BY MOUTH ONCE DAILY BEFORE BREAKFAST, Disp: 90 tablet, Rfl: 1 .  losartan (COZAAR) 50 MG tablet, TAKE 1 TABLET BY MOUTH EVERY DAY, Disp: 90 tablet, Rfl: 3 .  metFORMIN (GLUCOPHAGE) 500 MG tablet, TAKE 1 TABLET (500 MG TOTAL) BY MOUTH 2 (TWO) TIMES DAILY WITH A MEAL., Disp: 180 tablet, Rfl: 1 .  omeprazole (PRILOSEC) 40 MG capsule, TAKE 1 CAPSULE BY  MOUTH EVERY DAY, Disp: 90 capsule, Rfl: 1 .  ONETOUCH DELICA LANCETS FINE MISC, USE AS DIRECTED 4 TIMES A DAY, Disp: 100 each, Rfl: 2 .  ONETOUCH ULTRA test strip, USE AS DIRECTED 4 TIMES A DAY, Disp: 100 strip, Rfl: 2 .  PROAIR HFA 108 (90 Base) MCG/ACT inhaler, TAKE 2 PUFFS BY MOUTH EVERY 6 HOURS AS NEEDED FOR WHEEZE OR SHORTNESS OF BREATH, Disp: 18 g, Rfl: 0 .  tamsulosin (FLOMAX) 0.4 MG CAPS capsule, TAKE 1 CAPSULE BY MOUTH EVERY DAY, Disp: 90 capsule, Rfl: 1  EXAM:   General impression: alert, cooperative and articulate.  No signs of being in distress  Lungs: speech is fluent sentence length suggests that patient is not short of breath and not punctuated by cough, sneezing or sniffing. Marland Kitchen   Psych: affect normal.  speech is articulate and non pressured .  Denies suicidal thoughts   ASSESSMENT AND PLAN:  Discussed the following assessment and plan:  Stage 3a chronic kidney disease - Plan: Comprehensive metabolic panel  Mixed hyperlipidemia - Plan: Lipid panel  Controlled type 2 diabetes mellitus with diabetic nephropathy, without long-term current use of insulin ([place]) - Plan: Hemoglobin A1c  Personal history of covid-19  Essential hypertension  Personal history of covid-19 He was admitted to [facility] on Jan 4 with resp failure and positive covid test. . He  Has recovered fully   CKD (chronic kidney disease) stage 3, GFR 30-59 ml/min Renal function has been stable on ACE INhibitor,  He is avoiding NSAIDs and nephrotoxic agents.   Lab Results  Component Value Date   CREATININE 1.84 (H) 11/18/2019  . Lab Results  Component Value Date   MICROALBUR 4.1 (H) 11/18/2019          Hypertension Well controlled on current regimen. Renal function stable, no changes today.  Type 2 diabetes mellitus, controlled, with renal complications (HCC) Loss of control notedat last visit.  He is taking glipizide XL 5 mg and metformin 500 mg bid . Patient asked to submit sugars for evaluation    Lab Results  Component Value Date   HGBA1C 7.8 (A) 11/18/2019     Hyperlipidemia Triglycerides have been elevated on atorvastatin alone and LDL is not at goal of 70 or less.   Pending next evaluation,  Will need change to rousvastatin  Lab Results  Component Value Date   CHOL 168 11/18/2019   HDL 28.00 (L) 11/18/2019   LDLCALC 79 12/12/2016   LDLDIRECT 96.0 11/18/2019   TRIG 336.0 (H) 11/18/2019   CHOLHDL 6 11/18/2019       I discussed the assessment and treatment plan with the patient. The patient was provided an opportunity to ask questions and all were answered. The patient agreed with the plan and demonstrated an understanding of the instructions.   The  patient was advised to call back or seek an in-person evaluation if the symptoms worsen or if the condition fails to improve as anticipated.  I provided 30 minutes of non-face-to-face time during this encounter.   Crecencio Mc, MD

## 2020-02-21 ENCOUNTER — Encounter: Payer: Self-pay | Admitting: Internal Medicine

## 2020-02-21 NOTE — Assessment & Plan Note (Addendum)
He was admitted to [facility] on Jan 4 with resp failure and positive covid test. . He  Has recovered fully

## 2020-02-21 NOTE — Assessment & Plan Note (Addendum)
Loss of control notedat last visit.  He is taking glipizide XL 5 mg and metformin 500 mg bid . Patient asked to submit sugars for evaluation   Lab Results  Component Value Date   HGBA1C 7.8 (A) 11/18/2019

## 2020-02-21 NOTE — Assessment & Plan Note (Signed)
Well controlled on current regimen. Renal function stable, no changes today. 

## 2020-02-21 NOTE — Assessment & Plan Note (Signed)
Renal function has been stable on ACE INhibitor,  He is avoiding NSAIDs and nephrotoxic agents.   Lab Results  Component Value Date   CREATININE 1.84 (H) 11/18/2019  . Lab Results  Component Value Date   MICROALBUR 4.1 (H) 11/18/2019

## 2020-02-21 NOTE — Assessment & Plan Note (Addendum)
Triglycerides have been elevated on atorvastatin alone and LDL is not at goal of 70 or less.   Pending next evaluation,  Will need change to rousvastatin  Lab Results  Component Value Date   CHOL 168 11/18/2019   HDL 28.00 (L) 11/18/2019   LDLCALC 79 12/12/2016   LDLDIRECT 96.0 11/18/2019   TRIG 336.0 (H) 11/18/2019   CHOLHDL 6 11/18/2019

## 2020-02-23 ENCOUNTER — Telehealth: Payer: Self-pay | Admitting: Internal Medicine

## 2020-02-23 NOTE — Telephone Encounter (Signed)
Spoke with pt and he stated that he went to the wrong place. He stated that he went to [facility] [place] [facility] lab.

## 2020-02-23 NOTE — Telephone Encounter (Signed)
Patient went to [facility] lab, lab could not do his labs. Orders are not in the correct way. Please call patient when fixed so he can go have labs done.

## 2020-02-24 ENCOUNTER — Other Ambulatory Visit
Admission: RE | Admit: 2020-02-24 | Discharge: 2020-02-24 | Disposition: A | Discharge status: Home / Self Care | Payer: PPO | Attending: Internal Medicine | Admitting: Internal Medicine

## 2020-02-24 ENCOUNTER — Other Ambulatory Visit: Payer: Self-pay

## 2020-02-24 DIAGNOSIS — N1831 Chronic kidney disease, stage 3a: Secondary | ICD-10-CM | POA: Insufficient documentation

## 2020-02-24 DIAGNOSIS — E782 Mixed hyperlipidemia: Secondary | ICD-10-CM | POA: Insufficient documentation

## 2020-02-24 DIAGNOSIS — E1121 Type 2 diabetes mellitus with diabetic nephropathy: Secondary | ICD-10-CM | POA: Diagnosis not present

## 2020-02-24 LAB — COMPREHENSIVE METABOLIC PANEL
ALT: 27 U/L (ref 0–44)
AST: 20 U/L (ref 15–41)
Albumin: 3.8 g/dL (ref 3.5–5.0)
Alkaline Phosphatase: 89 U/L (ref 38–126)
Anion gap: 9 (ref 5–15)
BUN: 31 mg/dL — ABNORMAL HIGH (ref 8–23)
CO2: 23 mmol/L (ref 22–32)
Calcium: 9.1 mg/dL (ref 8.9–10.3)
Chloride: 105 mmol/L (ref 98–111)
Creatinine, Ser: 2.01 mg/dL — ABNORMAL HIGH (ref 0.61–1.24)
GFR calc Af Amer: 37 mL/min — ABNORMAL LOW (ref >60)
GFR calc non Af Amer: 32 mL/min — ABNORMAL LOW (ref >60)
Glucose, Bld: 177 mg/dL — ABNORMAL HIGH (ref 70–99)
Potassium: 4.8 mmol/L (ref 3.5–5.1)
Sodium: 137 mmol/L (ref 135–145)
Total Bilirubin: 1.1 mg/dL (ref 0.3–1.2)
Total Protein: 7.2 g/dL (ref 6.5–8.1)

## 2020-02-24 LAB — LIPID PANEL
Cholesterol: 157 mg/dL (ref 0–200)
HDL: 30 mg/dL — ABNORMAL LOW (ref >40)
LDL Cholesterol: 76 mg/dL (ref 0–99)
Total CHOL/HDL Ratio: 5.2 RATIO
Triglycerides: 253 mg/dL — ABNORMAL HIGH (ref <150)
VLDL: 51 mg/dL — ABNORMAL HIGH (ref 0–40)

## 2020-02-24 LAB — HEMOGLOBIN A1C
Hgb A1c MFr Bld: 6.6 % — ABNORMAL HIGH (ref 4.8–5.6)
Mean Plasma Glucose: 142.72 mg/dL

## 2020-03-09 ENCOUNTER — Telehealth: Payer: Self-pay | Admitting: Internal Medicine

## 2020-03-09 NOTE — Telephone Encounter (Signed)
Spoke with pt in regards to his lab results. Pt gave a verbal understanding.

## 2020-03-09 NOTE — Telephone Encounter (Signed)
Pt called to get lab results °

## 2020-04-14 DIAGNOSIS — Z951 Presence of aortocoronary bypass graft: Secondary | ICD-10-CM | POA: Diagnosis not present

## 2020-04-14 DIAGNOSIS — N182 Chronic kidney disease, stage 2 (mild): Secondary | ICD-10-CM | POA: Diagnosis not present

## 2020-04-14 DIAGNOSIS — E7849 Other hyperlipidemia: Secondary | ICD-10-CM | POA: Diagnosis not present

## 2020-04-14 DIAGNOSIS — I10 Essential (primary) hypertension: Secondary | ICD-10-CM | POA: Diagnosis not present

## 2020-04-14 DIAGNOSIS — U071 COVID-19: Secondary | ICD-10-CM | POA: Diagnosis not present

## 2020-04-14 DIAGNOSIS — I5022 Chronic systolic (congestive) heart failure: Secondary | ICD-10-CM | POA: Diagnosis not present

## 2020-04-14 DIAGNOSIS — E119 Type 2 diabetes mellitus without complications: Secondary | ICD-10-CM | POA: Diagnosis not present

## 2020-04-14 DIAGNOSIS — I255 Ischemic cardiomyopathy: Secondary | ICD-10-CM | POA: Diagnosis not present

## 2020-04-14 DIAGNOSIS — R002 Palpitations: Secondary | ICD-10-CM | POA: Diagnosis not present

## 2020-04-14 DIAGNOSIS — I2510 Atherosclerotic heart disease of native coronary artery without angina pectoris: Secondary | ICD-10-CM | POA: Diagnosis not present

## 2020-04-14 DIAGNOSIS — J1282 Pneumonia due to coronavirus disease 2019: Secondary | ICD-10-CM | POA: Diagnosis not present

## 2020-04-14 DIAGNOSIS — K219 Gastro-esophageal reflux disease without esophagitis: Secondary | ICD-10-CM | POA: Diagnosis not present

## 2020-06-05 ENCOUNTER — Other Ambulatory Visit: Payer: Self-pay | Admitting: Internal Medicine

## 2020-06-19 ENCOUNTER — Other Ambulatory Visit: Payer: Self-pay | Admitting: Internal Medicine

## 2020-07-03 ENCOUNTER — Other Ambulatory Visit: Payer: Self-pay | Admitting: Internal Medicine

## 2020-07-07 ENCOUNTER — Other Ambulatory Visit: Payer: Self-pay | Admitting: Internal Medicine

## 2020-07-13 ENCOUNTER — Other Ambulatory Visit: Payer: Self-pay | Admitting: Internal Medicine

## 2020-08-01 DIAGNOSIS — Z85850 Personal history of malignant neoplasm of thyroid: Secondary | ICD-10-CM | POA: Diagnosis not present

## 2020-08-01 DIAGNOSIS — E890 Postprocedural hypothyroidism: Secondary | ICD-10-CM | POA: Diagnosis not present

## 2020-08-02 ENCOUNTER — Ambulatory Visit (INDEPENDENT_AMBULATORY_CARE_PROVIDER_SITE_OTHER): Payer: PPO

## 2020-08-02 VITALS — Ht 71.0 in | Wt 182.0 lb

## 2020-08-02 DIAGNOSIS — Z0000 Encounter for general adult medical examination without abnormal findings: Secondary | ICD-10-CM | POA: Diagnosis not present

## 2020-08-02 NOTE — Progress Notes (Signed)
Subjective:   Nathaniel Lane is a 75 y.o. male who presents for Medicare Annual/Subsequent preventive examination.  Review of Systems    No ROS.  Medicare Wellness Virtual Visit.   Cardiac Risk Factors include: advanced age (>82men, >68 women);male gender;hypertension;diabetes mellitus     Objective:    Today's Vitals   08/02/20 0907  Weight: 182 lb (82.6 kg)  Height: $Remove'5\' 11"'lSufEuh$  (1.803 m)   Body mass index is 25.38 kg/m.  Advanced Directives 08/02/2020 07/31/2019 06/26/2019 07/29/2018 01/28/2018 02/20/2017 01/28/2017  Does Patient Have a Medical Advance Directive? Yes Yes Yes Yes Yes Yes No  Type of Paramedic of [place];Living will Healthcare Power of [place] will Healthcare Power of [place] of [place] -  Does patient want to make changes to medical advance directive? No - Patient declined No - Patient declined - No - Patient declined - No - Patient declined -  Copy of [place] in Chart? No - copy requested No - copy requested - No - copy requested - No - copy requested -  Would patient like information on creating a medical advance directive? - - No - Patient declined - - - No - Patient declined    Current Medications (verified) Outpatient Encounter Medications as of 08/02/2020  Medication Sig  . aspirin 81 MG EC tablet TAKE 1 TABLET (81 MG TOTAL) BY MOUTH DAILY. SWALLOW WHOLE.  Marland Kitchen atorvastatin (LIPITOR) 20 MG tablet TAKE 1 TABLET BY MOUTH EVERY DAY  . blood glucose meter kit and supplies KIT Dispense based on patient and insurance preference. Use up to four times daily as directed. (FOR ICD-10 E11.29).  Marland Kitchen gabapentin (NEURONTIN) 300 MG capsule TAKE 1 CAPSULE BY MOUTH THREE TIMES A DAY  . glipiZIDE (GLUCOTROL XL) 5 MG 24 hr tablet TAKE 1 TABLET BY MOUTH EVERY DAY WITH BREAKFAST  . levothyroxine (SYNTHROID, LEVOTHROID) 150 MCG tablet TAKE ONE TABLET BY MOUTH ONCE DAILY BEFORE BREAKFAST  . losartan  (COZAAR) 50 MG tablet TAKE 1 TABLET BY MOUTH EVERY DAY  . metFORMIN (GLUCOPHAGE) 500 MG tablet TAKE 1 TABLET (500 MG TOTAL) BY MOUTH 2 (TWO) TIMES DAILY WITH A MEAL.  Marland Kitchen omeprazole (PRILOSEC) 40 MG capsule TAKE 1 CAPSULE BY MOUTH EVERY DAY  . ONETOUCH DELICA LANCETS FINE MISC USE AS DIRECTED 4 TIMES A DAY  . ONETOUCH ULTRA test strip USE AS DIRECTED 4 TIMES A DAY  . PROAIR HFA 108 (90 Base) MCG/ACT inhaler TAKE 2 PUFFS BY MOUTH EVERY 6 HOURS AS NEEDED FOR WHEEZE OR SHORTNESS OF BREATH  . tamsulosin (FLOMAX) 0.4 MG CAPS capsule TAKE 1 CAPSULE BY MOUTH EVERY DAY   No facility-administered encounter medications on file as of 08/02/2020.    Allergies (verified) Penicillins   History: Past Medical History:  Diagnosis Date  . Cancer ([place]) 2012   Stage IIA squamous cell Lung Ca  . Diabetes mellitus   . Gout   . Hyperlipidemia   . Hypertension   . Myocardial infarction ([place])   . Thyroid cancer ([place]) 2013   s/p thyroidectomy 2014   Past Surgical History:  Procedure Laterality Date  . COLONOSCOPY N/A 04/25/2016   Procedure: COLONOSCOPY;  Surgeon: Manya Silvas, MD;  Location: [facility] ENDOSCOPY;  Service: Endoscopy;  Laterality: N/A;  Diabetic (oral meds)  . CORONARY ARTERY BYPASS GRAFT  2007  . LUNG LOBECTOMY    . TOTAL THYROIDECTOMY N/A    Family History  Problem Relation Age of  Onset  . Hypertension Other   . Heart disease Father    Social History   Socioeconomic History  . Marital status: Widowed    Spouse name: Not on file  . Number of children: Not on file  . Years of education: Not on file  . Highest education level: Not on file  Occupational History  . Occupation: Health visitor: retired  Tobacco Use  . Smoking status: Former Smoker    Quit date: 07/15/2005    Years since quitting: 15.0  . Smokeless tobacco: Never Used  Vaping Use  . Vaping Use: Never used  Substance and Sexual Activity  . Alcohol use: No  . Drug use: No  . Sexual activity: Not on file   Other Topics Concern  . Not on file  Social History Narrative   Lives alone; wife passed away 20-Jun-2019   Social Determinants of Health   Financial Resource Strain: Low Risk   . Difficulty of Paying Living Expenses: Not hard at all  Food Insecurity: No Food Insecurity  . Worried About Charity fundraiser in the Last Year: Never true  . Ran Out of Food in the Last Year: Never true  Transportation Needs: No Transportation Needs  . Lack of Transportation (Medical): No  . Lack of Transportation (Non-Medical): No  Physical Activity: Insufficiently Active  . Days of Exercise per Week: 4 days  . Minutes of Exercise per Session: 20 min  Stress: No Stress Concern Present  . Feeling of Stress : Not at all  Social Connections: Unknown  . Frequency of Communication with Friends and Family: More than three times a week  . Frequency of Social Gatherings with Friends and Family: More than three times a week  . Attends Religious Services: Not on file  . Active Member of Clubs or Organizations: Yes  . Attends Archivist Meetings: More than 4 times per year  . Marital Status: Widowed    Tobacco Counseling Counseling given: Not Answered   Clinical Intake:  Pre-visit preparation completed: Yes        Diabetes: Yes (Followed by pcp)  How often do you need to have someone help you when you read instructions, pamphlets, or other written materials from your doctor or pharmacy?: 1 - Never Nutrition Risk Assessment: Has the patient had any N/V/D within the last 2 week?  No  Does the patient have any non-healing wounds?  No  Has the patient had any unintentional weight loss or weight gain?  No   Diabetes: If diabetic, was a CBG obtained today?  Yes , [id number] Did the patient bring in their glucometer from home?  No  How often do you monitor your CBG's? Daily.   Financial Strains and Diabetes Management: Are you having any financial strains with the device, your supplies or your  medication? No .  Does the patient want to be seen by Chronic Care Management for management of their diabetes?  No  Would the patient like to be referred to a Nutritionist or for Diabetic Management?  No   Interpreter Needed?: No      Activities of Daily Living In your present state of health, do you have any difficulty performing the following activities: 08/02/2020  Hearing? N  Vision? N  Difficulty concentrating or making decisions? N  Walking or climbing stairs? N  Dressing or bathing? N  Doing errands, shopping? N  Preparing Food and eating ? N  Using the Toilet? N  In the past six months, have you accidently leaked urine? N  Do you have problems with loss of bowel control? N  Managing your Medications? N  Managing your Finances? N  Housekeeping or managing your Housekeeping? N  Some recent data might be hidden    Patient Care Team: Crecencio Mc, MD as PCP - General (Internal Medicine)  Indicate any recent Medical Services you may have received from other than [facility] providers in the past year (date may be approximate).     Assessment:   This is a routine wellness examination for Nathaniel Lane.  I connected with Nathaniel Lane today by telephone and verified that I am speaking with the correct person using two identifiers. Location patient: home Location provider: work Persons participating in the virtual visit: patient, Marine scientist.    I discussed the limitations, risks, security and privacy concerns of performing an evaluation and management service by telephone and the availability of in person appointments. The patient expressed understanding and verbally consented to this telephonic visit.    Interactive audio and video telecommunications were attempted between this provider and patient, however failed, due to patient having technical difficulties OR patient did not have access to video capability.  We continued and completed visit with audio only.  Some vital signs may be absent or  patient reported.   Hearing/Vision screen  Hearing Screening   '125Hz'$  $[place]o'250Hz'VzVEn$'500Hz'$'1000Hz'$'2000Hz'$'3000Hz'$'4000Hz'$'6000Hz'$'8000Hz'$   Right ear:           Left ear:           Comments: Patient is able to hear conversational tones without difficulty.  No issues reported.  Vision Screening Comments: Wears corrective lenses Visual acuity not assessed, virtual visit.  They have seen their ophthalmologist in the last 12 months.     Dietary issues and exercise activities discussed: Current Exercise Habits: Home exercise routine, Type of exercise: walking, Intensity: Mild  Healthy diet Good water intake  Goals    . Increase physical activity     Stay active. Walk for exercise, moderate pace.      Depression Screen PHQ 2/9 Scores 08/02/2020 07/31/2019 07/29/2018 02/20/2017 08/15/2016 11/01/2014 01/12/2014  PHQ - 2 Score 0 0 0 0 1 0 0  PHQ- 9 Score - - - 0 - - -    Fall Risk Fall Risk  08/02/2020 02/19/2020 11/18/2019 07/31/2019 07/08/2019  Falls in the past year? 0 1 0 0 0  Number falls in past yr: 0 - - - -  Injury with Fall? 0 - - - -  Follow up Falls evaluation completed Falls evaluation completed Falls evaluation completed Falls evaluation completed Falls evaluation completed    [place]: Handrails in use when climbing stairs? Yes Home free of loose throw rugs in walkways, pet beds, electrical cords, etc? Yes  Adequate lighting in your home to reduce risk of falls? Yes   ASSISTIVE DEVICES UTILIZED TO PREVENT FALLS: Life alert? No  Use of a cane, walker or w/c? No   TIMED UP AND GO: Was the test performed? No . Virtual visit.   Cognitive Function: MMSE - Mini Mental State Exam 02/20/2017 11/01/2014  Orientation to time 5 5  Orientation to Place 5 5  Registration 3 3  Attention/ Calculation 5 5  Recall 3 3  Language- name 2 objects 2 2  Language- repeat 1 1  Language- follow 3 step command 3 3  Language- read & follow direction 1 1  Write a sentence 1 1   Copy design 1 1  Total score 30 30     6CIT Screen 08/02/2020 07/29/2018  What Year? 0 points 0 points  What month? 0 points 0 points  What time? 0 points 0 points  Count back from 20 0 points 0 points  Months in reverse 0 points 0 points  Repeat phrase - 0 points  Total Score - 0    Immunizations Immunization History  Administered Date(s) Administered  . Influenza Split 04/15/2013  . PFIZER(Purple Top)SARS-COV-2 Vaccination 01/09/2020, 01/30/2020  . Pneumococcal Conjugate-13 07/15/2013  . Pneumococcal Polysaccharide-23 01/15/2012, 05/06/2017  . Tdap 03/06/2007, 08/20/2019    Health Maintenance Health Maintenance  Topic Date Due  . COVID-19 Vaccine (3 - Pfizer risk 4-dose series) 02/27/2020  . HEMOGLOBIN A1C  08/23/2020  . FOOT EXAM  11/17/2020  . OPHTHALMOLOGY EXAM  01/10/2021  . COLONOSCOPY (Pts 45-24yrs Insurance coverage will need to be confirmed)  04/25/2021  . TETANUS/TDAP  08/19/2029  . Hepatitis C Screening  Completed  . PNA vac Low Risk Adult  Completed  . INFLUENZA VACCINE  Discontinued    Colorectal cancer screening: Type of screening: Colonoscopy. Completed 04/25/16. Repeat every 5 years  Lung Cancer Screening: (Low Dose CT Chest recommended if Age 70-80 years, 30 pack-year currently smoking OR have quit w/in 15years.) does not qualify.    Hepatitis C Screening: Completed 10/26/15.   Vision Screening: Recommended annual ophthalmology exams for early detection of glaucoma and other disorders of the eye. Is the patient up to date with their annual eye exam?  Yes  Who is the provider or what is the name of the office in which the patient attends annual eye exams? [facility].   Dental Screening: Recommended annual dental exams for proper oral hygiene.  Community Resource Referral / Chronic Care Management: CRR required this visit?  No   CCM required this visit?  No      Plan:   Keep all routine maintenance appointments.   Follow up 11/18/20 @  8:30  I have personally reviewed and noted the following in the patient's chart:   . Medical and social history . Use of alcohol, tobacco or illicit drugs  . Current medications and supplements . Functional ability and status . Nutritional status . Physical activity . Advanced directives . List of other physicians . Hospitalizations, surgeries, and ER visits in previous 12 months . Vitals . Screenings to include cognitive, depression, and falls . Referrals and appointments  In addition, I have reviewed and discussed with patient certain preventive protocols, quality metrics, and best practice recommendations. A written personalized care plan for preventive services as well as general preventive health recommendations were provided to patient via mychart.     Varney Biles, LPN   01/27/4626

## 2020-08-02 NOTE — Patient Instructions (Addendum)
[name] , Thank you for taking time to come for your Medicare Wellness Visit. I appreciate your ongoing commitment to your health goals. Please review the following plan we discussed and let me know if I can assist you in the future.   These are the goals we discussed: Goals    . Increase physical activity     Stay active. Walk for exercise, moderate pace.       This is a list of the screening recommended for you and due dates:  Health Maintenance  Topic Date Due  . COVID-19 Vaccine (3 - Pfizer risk 4-dose series) 02/27/2020  . Hemoglobin A1C  08/23/2020  . Complete foot exam   11/17/2020  . Eye exam for diabetics  01/10/2021  . Colon Cancer Screening  04/25/2021  . Tetanus Vaccine  08/19/2029  .  Hepatitis C: One time screening is recommended by Center for Disease Control  (CDC) for  adults born from [birth year] through 1963.   Completed  . Pneumonia vaccines  Completed  . Flu Shot  Discontinued    Immunizations Immunization History  Administered Date(s) Administered  . Influenza Split 04/15/2013  . PFIZER(Purple Top)SARS-COV-2 Vaccination 01/09/2020, 01/30/2020  . Pneumococcal Conjugate-13 07/15/2013  . Pneumococcal Polysaccharide-23 01/15/2012, 05/06/2017  . Tdap 03/06/2007, 08/20/2019   Keep all routine maintenance appointments.   Follow up 11/18/20 @ 8:30  Advanced directives: End of life planning; Advance aging; Advanced directives discussed.  Copy of current HCPOA/Living Will requested.    Conditions/risks identified: none new.  Follow up in one year for your annual wellness visit.   Preventive Care 73 Years and Older, Male Preventive care refers to lifestyle choices and visits with your health care provider that can promote health and wellness. What does preventive care include?  A yearly physical exam. This is also called an annual well check.  Dental exams once or twice a year.  Routine eye exams. Ask your health care provider how often you should have your  eyes checked.  Personal lifestyle choices, including:  Daily care of your teeth and gums.  Regular physical activity.  Eating a healthy diet.  Avoiding tobacco and drug use.  Limiting alcohol use.  Practicing safe sex.  Taking low doses of aspirin every day.  Taking vitamin and mineral supplements as recommended by your health care provider. What happens during an annual well check? The services and screenings done by your health care provider during your annual well check will depend on your age, overall health, lifestyle risk factors, and family history of disease. Counseling  Your health care provider may ask you questions about your:  Alcohol use.  Tobacco use.  Drug use.  Emotional well-being.  Home and relationship well-being.  Sexual activity.  Eating habits.  History of falls.  Memory and ability to understand (cognition).  Work and work Statistician. Screening  You may have the following tests or measurements:  Height, weight, and BMI.  Blood pressure.  Lipid and cholesterol levels. These may be checked every 5 years, or more frequently if you are over 4 years old.  Skin check.  Lung cancer screening. You may have this screening every year starting at age 21 if you have a 30-pack-year history of smoking and currently smoke or have quit within the past 15 years.  Fecal occult blood test (FOBT) of the stool. You may have this test every year starting at age 85.  Flexible sigmoidoscopy or colonoscopy. You may have a sigmoidoscopy every 5 years or a  colonoscopy every 10 years starting at age 56.  Prostate cancer screening. Recommendations will vary depending on your family history and other risks.  Hepatitis C blood test.  Hepatitis B blood test.  Sexually transmitted disease (STD) testing.  Diabetes screening. This is done by checking your blood sugar (glucose) after you have not eaten for a while (fasting). You may have this done every 1-3  years.  Abdominal aortic aneurysm (AAA) screening. You may need this if you are a current or former smoker.  Osteoporosis. You may be screened starting at age 73 if you are at high risk. Talk with your health care provider about your test results, treatment options, and if necessary, the need for more tests. Vaccines  Your health care provider may recommend certain vaccines, such as:  Influenza vaccine. This is recommended every year.  Tetanus, diphtheria, and acellular pertussis (Tdap, Td) vaccine. You may need a Td booster every 10 years.  Zoster vaccine. You may need this after age 73.  Pneumococcal 13-valent conjugate (PCV13) vaccine. One dose is recommended after age 58.  Pneumococcal polysaccharide (PPSV23) vaccine. One dose is recommended after age 31. Talk to your health care provider about which screenings and vaccines you need and how often you need them. This information is not intended to replace advice given to you by your health care provider. Make sure you discuss any questions you have with your health care provider. Document Released: 07/08/2015 Document Revised: 02/29/2016 Document Reviewed: 04/12/2015 Elsevier Interactive Patient Education  2017 Nathaniel Lane Prevention in the Home Falls can cause injuries. They can happen to people of all ages. There are many things you can do to make your home safe and to help prevent falls. What can I do on the outside of my home?  Regularly fix the edges of walkways and driveways and fix any cracks.  Remove anything that might make you trip as you walk through a door, such as a raised step or threshold.  Trim any bushes or trees on the path to your home.  Use bright outdoor lighting.  Clear any walking paths of anything that might make someone trip, such as rocks or tools.  Regularly check to see if handrails are loose or broken. Make sure that both sides of any steps have handrails.  Any raised decks and porches  should have guardrails on the edges.  Have any leaves, snow, or ice cleared regularly.  Use sand or salt on walking paths during winter.  Clean up any spills in your garage right away. This includes oil or grease spills. What can I do in the bathroom?  Use night lights.  Install grab bars by the toilet and in the tub and shower. Do not use towel bars as grab bars.  Use non-skid mats or decals in the tub or shower.  If you need to sit down in the shower, use a plastic, non-slip stool.  Keep the floor dry. Clean up any water that spills on the floor as soon as it happens.  Remove soap buildup in the tub or shower regularly.  Attach bath mats securely with double-sided non-slip rug tape.  Do not have throw rugs and other things on the floor that can make you trip. What can I do in the bedroom?  Use night lights.  Make sure that you have a light by your bed that is easy to reach.  Do not use any sheets or blankets that are too big for your bed. They  should not hang down onto the floor.  Have a firm chair that has side arms. You can use this for support while you get dressed.  Do not have throw rugs and other things on the floor that can make you trip. What can I do in the kitchen?  Clean up any spills right away.  Avoid walking on wet floors.  Keep items that you use a lot in easy-to-reach places.  If you need to reach something above you, use a strong step stool that has a grab bar.  Keep electrical cords out of the way.  Do not use floor polish or wax that makes floors slippery. If you must use wax, use non-skid floor wax.  Do not have throw rugs and other things on the floor that can make you trip. What can I do with my stairs?  Do not leave any items on the stairs.  Make sure that there are handrails on both sides of the stairs and use them. Fix handrails that are broken or loose. Make sure that handrails are as long as the stairways.  Check any carpeting to  make sure that it is firmly attached to the stairs. Fix any carpet that is loose or worn.  Avoid having throw rugs at the top or bottom of the stairs. If you do have throw rugs, attach them to the floor with carpet tape.  Make sure that you have a light switch at the top of the stairs and the bottom of the stairs. If you do not have them, ask someone to add them for you. What else can I do to help prevent falls?  Wear shoes that:  Do not have high heels.  Have rubber bottoms.  Are comfortable and fit you well.  Are closed at the toe. Do not wear sandals.  If you use a stepladder:  Make sure that it is fully opened. Do not climb a closed stepladder.  Make sure that both sides of the stepladder are locked into place.  Ask someone to hold it for you, if possible.  Clearly mark and make sure that you can see:  Any grab bars or handrails.  First and last steps.  Where the edge of each step is.  Use tools that help you move around (mobility aids) if they are needed. These include:  Canes.  Walkers.  Scooters.  Crutches.  Turn on the lights when you go into a dark area. Replace any light bulbs as soon as they burn out.  Set up your furniture so you have a clear path. Avoid moving your furniture around.  If any of your floors are uneven, fix them.  If there are any pets around you, be aware of where they are.  Review your medicines with your doctor. Some medicines can make you feel dizzy. This can increase your chance of falling. Ask your doctor what other things that you can do to help prevent falls. This information is not intended to replace advice given to you by your health care provider. Make sure you discuss any questions you have with your health care provider. Document Released: 04/07/2009 Document Revised: 11/17/2015 Document Reviewed: 07/16/2014 Elsevier Interactive Patient Education  2017 [employer].

## 2020-08-10 DIAGNOSIS — Z85850 Personal history of malignant neoplasm of thyroid: Secondary | ICD-10-CM | POA: Diagnosis not present

## 2020-08-10 DIAGNOSIS — E890 Postprocedural hypothyroidism: Secondary | ICD-10-CM | POA: Diagnosis not present

## 2020-08-23 ENCOUNTER — Other Ambulatory Visit: Payer: Self-pay | Admitting: Internal Medicine

## 2020-10-10 DIAGNOSIS — Z85118 Personal history of other malignant neoplasm of bronchus and lung: Secondary | ICD-10-CM | POA: Diagnosis not present

## 2020-10-10 DIAGNOSIS — Z951 Presence of aortocoronary bypass graft: Secondary | ICD-10-CM | POA: Diagnosis not present

## 2020-10-10 DIAGNOSIS — I2510 Atherosclerotic heart disease of native coronary artery without angina pectoris: Secondary | ICD-10-CM | POA: Diagnosis not present

## 2020-10-10 DIAGNOSIS — E119 Type 2 diabetes mellitus without complications: Secondary | ICD-10-CM | POA: Diagnosis not present

## 2020-10-10 DIAGNOSIS — E7849 Other hyperlipidemia: Secondary | ICD-10-CM | POA: Diagnosis not present

## 2020-10-10 DIAGNOSIS — I255 Ischemic cardiomyopathy: Secondary | ICD-10-CM | POA: Diagnosis not present

## 2020-10-10 DIAGNOSIS — N182 Chronic kidney disease, stage 2 (mild): Secondary | ICD-10-CM | POA: Diagnosis not present

## 2020-10-10 DIAGNOSIS — K219 Gastro-esophageal reflux disease without esophagitis: Secondary | ICD-10-CM | POA: Diagnosis not present

## 2020-10-10 DIAGNOSIS — R002 Palpitations: Secondary | ICD-10-CM | POA: Diagnosis not present

## 2020-10-10 DIAGNOSIS — I10 Essential (primary) hypertension: Secondary | ICD-10-CM | POA: Diagnosis not present

## 2020-10-10 DIAGNOSIS — I5022 Chronic systolic (congestive) heart failure: Secondary | ICD-10-CM | POA: Diagnosis not present

## 2020-11-18 ENCOUNTER — Ambulatory Visit: Payer: PPO | Admitting: Internal Medicine

## 2020-11-28 ENCOUNTER — Ambulatory Visit (INDEPENDENT_AMBULATORY_CARE_PROVIDER_SITE_OTHER): Payer: PPO | Admitting: Internal Medicine

## 2020-11-28 ENCOUNTER — Other Ambulatory Visit: Payer: Self-pay

## 2020-11-28 ENCOUNTER — Other Ambulatory Visit: Payer: Self-pay | Admitting: Internal Medicine

## 2020-11-28 ENCOUNTER — Encounter: Payer: Self-pay | Admitting: Internal Medicine

## 2020-11-28 VITALS — BP 136/82 | HR 56 | Temp 96.0°F | Resp 15 | Ht 71.0 in | Wt 182.2 lb

## 2020-11-28 DIAGNOSIS — E890 Postprocedural hypothyroidism: Secondary | ICD-10-CM | POA: Diagnosis not present

## 2020-11-28 DIAGNOSIS — I10 Essential (primary) hypertension: Secondary | ICD-10-CM | POA: Diagnosis not present

## 2020-11-28 DIAGNOSIS — E782 Mixed hyperlipidemia: Secondary | ICD-10-CM | POA: Diagnosis not present

## 2020-11-28 DIAGNOSIS — Z23 Encounter for immunization: Secondary | ICD-10-CM

## 2020-11-28 DIAGNOSIS — N1831 Chronic kidney disease, stage 3a: Secondary | ICD-10-CM

## 2020-11-28 DIAGNOSIS — E1121 Type 2 diabetes mellitus with diabetic nephropathy: Secondary | ICD-10-CM | POA: Diagnosis not present

## 2020-11-28 DIAGNOSIS — R350 Frequency of micturition: Secondary | ICD-10-CM | POA: Diagnosis not present

## 2020-11-28 DIAGNOSIS — I700 Atherosclerosis of aorta: Secondary | ICD-10-CM | POA: Diagnosis not present

## 2020-11-28 MED ORDER — ZOSTER VAC RECOMB ADJUVANTED 50 MCG/0.5ML IM SUSR: 0.5000 mL | Freq: Once | INTRAMUSCULAR | 1 refills | Status: AC

## 2020-11-28 MED ORDER — CANAGLIFLOZIN-METFORMIN HCL 50-500 MG PO TABS: 1.0000 | ORAL_TABLET | Freq: Two times a day (BID) | ORAL | 2 refills | Status: DC

## 2020-11-28 NOTE — Progress Notes (Signed)
Subjective:  [name], male    DOB: [birth year]  Age: 75 y.o. MRN: [id number]  CC: The primary encounter diagnosis was Stage 3a chronic kidney disease ([place]). Diagnoses of Mixed hyperlipidemia, Controlled type 2 diabetes mellitus with diabetic nephropathy, without long-term current use of insulin ([place]), COVID-19 vaccine administered, Urinary frequency, Aortic atherosclerosis ([place]), Postsurgical hypothyroidism, and Elevated blood pressure reading in office with white coat syndrome, with diagnosis of hypertension were also pertinent to this visit.  HPI SADAT SLIWA presents for follow up on type 2 DM with hyperlipidemia, htn   This visit occurred during the SARS-CoV-2 public health emergency.  Safety protocols were in place, including screening questions prior to the visit, additional usage of staff PPE, and extensive cleaning of exam room while observing appropriate contact time as indicated for disinfecting solutions.    T2DM:  he  feels generally well,  But is not  exercising regularly or trying to lose weight. Checking  blood sugars once daily in the morning . fasting BS have been under 200 and over 150.  Denies any recent hypoglyemic events.  Taking   medications as directed. Following a carbohydrate modified diet 6 days per week. Denies numbness, burning and tingling of extremities. Appetite is good. Diet reviewed .  White toast,  Creamed potatoes and fried apples. Green beans, pinto beans,  Turnip greens..  No ice cream .   Taking metformin and glipizide as directed..  Discussed starting a SGL2   Hypertension: patient occasionally checks blood pressure at home.  Readings have been for the most part > 140/80 during office visits but 130/80 at home  . Patient is following a reduce salt diet most days and is taking medications as prescribed/.  He has documented White coat hypertension     Aortic atherosclerosis:  We reviewed findings of prior CT scan today..  Patient is tolerating  high potency statin therapy (ATORVASTATIN)   Outpatient Medications Prior to Visit  Medication Sig Dispense Refill  . aspirin 81 MG EC tablet TAKE 1 TABLET (81 MG TOTAL) BY MOUTH DAILY. SWALLOW WHOLE. 90 tablet 3  . blood glucose meter kit and supplies KIT Dispense based on patient and insurance preference. Use up to four times daily as directed. (FOR ICD-10 E11.29). 1 each 0  . gabapentin (NEURONTIN) 300 MG capsule TAKE 1 CAPSULE BY MOUTH THREE TIMES A DAY 270 capsule 2  . levothyroxine (SYNTHROID, LEVOTHROID) 150 MCG tablet TAKE ONE TABLET BY MOUTH ONCE DAILY BEFORE BREAKFAST 90 tablet 1  . losartan (COZAAR) 50 MG tablet TAKE 1 TABLET BY MOUTH EVERY DAY 90 tablet 3  . omeprazole (PRILOSEC) 40 MG capsule TAKE 1 CAPSULE BY MOUTH EVERY DAY 90 capsule 1  . ONETOUCH DELICA LANCETS FINE MISC USE AS DIRECTED 4 TIMES A DAY 100 each 2  . ONETOUCH ULTRA test strip USE AS DIRECTED 4 TIMES A DAY 100 strip 2  . PROAIR HFA 108 (90 Base) MCG/ACT inhaler TAKE 2 PUFFS BY MOUTH EVERY 6 HOURS AS NEEDED FOR WHEEZE OR SHORTNESS OF BREATH 18 g 0  . atorvastatin (LIPITOR) 20 MG tablet TAKE 1 TABLET BY MOUTH EVERY DAY 90 tablet 1  . glipiZIDE (GLUCOTROL XL) 5 MG 24 hr tablet TAKE 1 TABLET BY MOUTH EVERY DAY WITH BREAKFAST 90 tablet 1  . metFORMIN (GLUCOPHAGE) 500 MG tablet TAKE 1 TABLET (500 MG TOTAL) BY MOUTH 2 (TWO) TIMES DAILY WITH A MEAL. 180 tablet 1  . tamsulosin (FLOMAX) 0.4 MG CAPS capsule TAKE  1 CAPSULE BY MOUTH EVERY DAY 90 capsule 1   No facility-administered medications prior to visit.    Review of Systems;  Patient denies headache, fevers, malaise, unintentional weight loss, skin rash, eye pain, sinus congestion and sinus pain, sore throat, dysphagia,  hemoptysis , cough, dyspnea, wheezing, chest pain, palpitations, orthopnea, edema, abdominal pain, nausea, melena, diarrhea, constipation, flank pain, dysuria, hematuria, urinary  Frequency, nocturia, numbness, tingling, seizures,  Focal weakness, Loss  of consciousness,  Tremor, insomnia, depression, anxiety, and suicidal ideation.      Objective:  BP 136/82 (BP Location: Left Arm, Patient Position: Sitting, Cuff Size: Normal)   Pulse (!) 56   Temp (!) 96 F (35.6 C) (Oral)   Resp 15   Ht 5\' 11"  (1.803 m)   Wt 182 lb 3.2 oz (82.6 kg)   SpO2 98%   BMI 25.41 kg/m   BP Readings from Last 3 Encounters:  11/28/20 136/82  02/19/20 120/80  11/18/19 118/64    Wt Readings from Last 3 Encounters:  11/28/20 182 lb 3.2 oz (82.6 kg)  08/02/20 182 lb (82.6 kg)  02/19/20 182 lb (82.6 kg)    General appearance: alert, cooperative and appears stated age Ears: normal TM's and external ear canals both ears Throat: lips, mucosa, and tongue normal; teeth and gums normal Neck: no adenopathy, no carotid bruit, supple, symmetrical, trachea midline and thyroid not enlarged, symmetric, no tenderness/mass/nodules Back: symmetric, no curvature. ROM normal. No CVA tenderness. Lungs: clear to auscultation bilaterally Heart: regular rate and rhythm, S1, S2 normal, no murmur, click, rub or gallop Abdomen: soft, non-tender; bowel sounds normal; no masses,  no organomegaly Pulses: 2+ and symmetric Skin: Skin color, texture, turgor normal. No rashes or lesions Lymph nodes: Cervical, supraclavicular, and axillary nodes normal.  Lab Results  Component Value Date   HGBA1C 6.6 (H) 02/24/2020   HGBA1C 7.8 (A) 11/18/2019   HGBA1C 6.7 (H) 07/29/2018    Lab Results  Component Value Date   CREATININE 2.01 (H) 02/24/2020   CREATININE 1.84 (H) 11/18/2019   CREATININE 1.73 (H) 07/29/2018    Lab Results  Component Value Date   WBC 8.6 08/09/2016   HGB 14.5 08/09/2016   HCT 44.2 08/09/2016   PLT 152.0 08/09/2016   GLUCOSE 177 (H) 02/24/2020   CHOL 157 02/24/2020   TRIG 253 (H) 02/24/2020   HDL 30 (L) 02/24/2020   LDLDIRECT 96.0 11/18/2019   LDLCALC 76 02/24/2020   ALT 27 02/24/2020   AST 20 02/24/2020   NA 137 02/24/2020   K 4.8 02/24/2020    CL 105 02/24/2020   CREATININE 2.01 (H) 02/24/2020   BUN 31 (H) 02/24/2020   CO2 23 02/24/2020   TSH 1.85 01/28/2018   PSA 1.14 11/18/2019   INR 1.2 09/27/2011   HGBA1C 6.6 (H) 02/24/2020   MICROALBUR 4.1 (H) 11/18/2019    No results found.  Assessment & Plan:   Problem List Items Addressed This Visit      Unprioritized   Aortic atherosclerosis (HCC)    Reviewed findings of prior CT scan today..  Patient is tolerating high potency statin therapy       CKD (chronic kidney disease) stage 3, GFR 30-59 ml/min (HCC) - Primary    Renal function has been stable on ACE INhibitor,  He is avoiding NSAIDs and nephrotoxic agents.  Repeat assessment is due   Lab Results  Component Value Date   CREATININE 2.01 (H) 02/24/2020  . Lab Results  Component Value Date  MICROALBUR 4.1 (H) 11/18/2019              Relevant Orders   Comprehensive metabolic panel   Elevated blood pressure reading in office with white coat syndrome, with diagnosis of hypertension    Well controlled on current regimen based on home readings.  He has white coat hypertension. . Renal function stable, no changes today.      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 No recurrence,       Type 2 diabetes mellitus, controlled, with renal complications (HCC)    Loss of control noted again based on fasting sugars reported and reviewed today   He is taking glipizide XL 5 mg and metformin 500 mg bid .  Adding SGLT2 inhibitor today given his history of CAD and CHF          Relevant Medications   Canagliflozin-metFORMIN HCl 50-500 MG TABS   Other Relevant Orders   Microalbumin / creatinine urine ratio   Hemoglobin A1c    Other Visit Diagnoses    COVID-19 vaccine administered       Relevant Orders   SARS-CoV-2 Semi-Quantitative Total Antibody, Spike   Urinary frequency       Relevant  Orders   Urinalysis, Routine w reflex microscopic   Urine Culture    A total of 40 minutes was spent with patient more than half of which was spent in counseling patient on the above mentioned issues , reviewing and explaining recent labs and imaging studies done, and coordination of care.  I have discontinued Lonell Grandchild. Mccain's metFORMIN. I am also having him start on Canagliflozin-metFORMIN HCl and Zoster Vaccine Adjuvanted. Additionally, I am having him maintain his levothyroxine, blood glucose meter kit and supplies, OneTouch Delica Lancets Fine, omeprazole, ProAir HFA, aspirin, OneTouch Ultra, gabapentin, and losartan.  Meds ordered this encounter  Medications  . Canagliflozin-metFORMIN HCl 50-500 MG TABS    Sig: Take 1 tablet by mouth 2 (two) times daily.    Dispense:  60 tablet    Refill:  2  . Zoster Vaccine Adjuvanted [facility]) injection    Sig: Inject 0.5 mLs into the muscle once for 1 dose.    Dispense:  1 each    Refill:  1    Medications Discontinued During This Encounter  Medication Reason  . metFORMIN (GLUCOPHAGE) 500 MG tablet     Follow-up: Return in about 3 months (around 02/28/2021) for follow up diabetes.   Crecencio Mc, MD

## 2020-11-28 NOTE — Assessment & Plan Note (Signed)
Well controlled on current regimen based on home readings.  He has white coat hypertension. . Renal function stable, no changes today.

## 2020-11-28 NOTE — Assessment & Plan Note (Addendum)
Renal function has been stable on ACE INhibitor,  He is avoiding NSAIDs and nephrotoxic agents.  Repeat assessment is due   Lab Results  Component Value Date   CREATININE 2.01 (H) 02/24/2020  . Lab Results  Component Value Date   MICROALBUR 4.1 (H) 11/18/2019

## 2020-11-28 NOTE — Patient Instructions (Signed)
YOUR BLOOD SUGARS ARE TOO HIGH.  Try cutting out some of the sugarin your diet (LESS WHITE BREAD, LESS POTATOES)  I am adding another medication to control your blood sugars . It is in combination with metformin so you can stop the metformin when you start taking it  It will make you urinate more frequently  IF YOU FEEL LIKE IT IS MAKING YOU PEE MORE,  YOU CAN BRING IN A URINE SAMPLE

## 2020-11-28 NOTE — Assessment & Plan Note (Addendum)
Loss of control noted again based on fasting sugars reported and reviewed today   He is taking glipizide XL 5 mg and metformin 500 mg bid .  Adding SGLT2 inhibitor today given his history of CAD and CHF

## 2020-11-28 NOTE — Assessment & Plan Note (Signed)
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 No recurrence,

## 2020-11-28 NOTE — Assessment & Plan Note (Signed)
Reviewed findings of prior CT scan today..  Patient is tolerating high potency statin therapy  

## 2020-11-29 LAB — MICROALBUMIN / CREATININE URINE RATIO
Creatinine, Urine: 57.1 mg/dL
Microalb/Creat Ratio: 99 mg/g creat — ABNORMAL HIGH (ref 0–29)
Microalbumin, Urine: 56.6 ug/mL

## 2020-12-13 ENCOUNTER — Ambulatory Visit: Payer: PPO | Admitting: Urology

## 2020-12-24 ENCOUNTER — Other Ambulatory Visit: Payer: Self-pay

## 2021-01-30 DIAGNOSIS — E890 Postprocedural hypothyroidism: Secondary | ICD-10-CM | POA: Diagnosis not present

## 2021-01-30 DIAGNOSIS — Z85850 Personal history of malignant neoplasm of thyroid: Secondary | ICD-10-CM | POA: Diagnosis not present

## 2021-02-06 LAB — HM DIABETES EYE EXAM

## 2021-02-08 DIAGNOSIS — H35033 Hypertensive retinopathy, bilateral: Secondary | ICD-10-CM | POA: Diagnosis not present

## 2021-02-10 DIAGNOSIS — E890 Postprocedural hypothyroidism: Secondary | ICD-10-CM | POA: Diagnosis not present

## 2021-02-10 DIAGNOSIS — Z85850 Personal history of malignant neoplasm of thyroid: Secondary | ICD-10-CM | POA: Diagnosis not present

## 2021-02-20 ENCOUNTER — Other Ambulatory Visit: Payer: Self-pay | Admitting: Internal Medicine

## 2021-03-02 ENCOUNTER — Encounter: Payer: Self-pay | Admitting: Internal Medicine

## 2021-03-02 ENCOUNTER — Ambulatory Visit (INDEPENDENT_AMBULATORY_CARE_PROVIDER_SITE_OTHER): Payer: PPO | Admitting: Internal Medicine

## 2021-03-02 ENCOUNTER — Other Ambulatory Visit: Payer: Self-pay

## 2021-03-02 VITALS — BP 138/84 | HR 72 | Temp 97.6°F | Ht 71.0 in | Wt 182.4 lb

## 2021-03-02 DIAGNOSIS — I10 Essential (primary) hypertension: Secondary | ICD-10-CM | POA: Diagnosis not present

## 2021-03-02 DIAGNOSIS — N1831 Chronic kidney disease, stage 3a: Secondary | ICD-10-CM | POA: Diagnosis not present

## 2021-03-02 DIAGNOSIS — E782 Mixed hyperlipidemia: Secondary | ICD-10-CM

## 2021-03-02 DIAGNOSIS — E785 Hyperlipidemia, unspecified: Secondary | ICD-10-CM | POA: Diagnosis not present

## 2021-03-02 DIAGNOSIS — D126 Benign neoplasm of colon, unspecified: Secondary | ICD-10-CM

## 2021-03-02 DIAGNOSIS — E1169 Type 2 diabetes mellitus with other specified complication: Secondary | ICD-10-CM

## 2021-03-02 DIAGNOSIS — R350 Frequency of micturition: Secondary | ICD-10-CM

## 2021-03-02 DIAGNOSIS — M109 Gout, unspecified: Secondary | ICD-10-CM

## 2021-03-02 DIAGNOSIS — E1121 Type 2 diabetes mellitus with diabetic nephropathy: Secondary | ICD-10-CM | POA: Diagnosis not present

## 2021-03-02 DIAGNOSIS — Z23 Encounter for immunization: Secondary | ICD-10-CM

## 2021-03-02 LAB — POCT GLYCOSYLATED HEMOGLOBIN (HGB A1C): Hemoglobin A1C: 6.5 % — AB (ref 4.0–5.6)

## 2021-03-02 LAB — URINALYSIS, ROUTINE W REFLEX MICROSCOPIC
Bilirubin Urine: NEGATIVE
Hgb urine dipstick: NEGATIVE
Ketones, ur: NEGATIVE
Leukocytes,Ua: NEGATIVE
Nitrite: NEGATIVE
RBC / HPF: NONE SEEN (ref 0–?)
Specific Gravity, Urine: 1.01 (ref 1.000–1.030)
Total Protein, Urine: NEGATIVE
Urine Glucose: >=1000 — AB
Urobilinogen, UA: 0.2 (ref 0.0–1.0)
WBC, UA: NONE SEEN (ref 0–?)
pH: 5.5 (ref 5.0–8.0)

## 2021-03-02 LAB — LIPID PANEL
Cholesterol: 178 mg/dL (ref 0–200)
HDL: 34 mg/dL — ABNORMAL LOW (ref >39.00)
NonHDL: 143.65
Total CHOL/HDL Ratio: 5
Triglycerides: 334 mg/dL — ABNORMAL HIGH (ref 0.0–149.0)
VLDL: 66.8 mg/dL — ABNORMAL HIGH (ref 0.0–40.0)

## 2021-03-02 LAB — COMPREHENSIVE METABOLIC PANEL
ALT: 24 U/L (ref 0–53)
AST: 17 U/L (ref 0–37)
Albumin: 4.3 g/dL (ref 3.5–5.2)
Alkaline Phosphatase: 99 U/L (ref 39–117)
BUN: 37 mg/dL — ABNORMAL HIGH (ref 6–23)
CO2: 26 mEq/L (ref 19–32)
Calcium: 9.7 mg/dL (ref 8.4–10.5)
Chloride: 103 mEq/L (ref 96–112)
Creatinine, Ser: 1.95 mg/dL — ABNORMAL HIGH (ref 0.40–1.50)
GFR: 33.57 mL/min — ABNORMAL LOW (ref >60.00)
Glucose, Bld: 119 mg/dL — ABNORMAL HIGH (ref 70–99)
Potassium: 5 mEq/L (ref 3.5–5.1)
Sodium: 138 mEq/L (ref 135–145)
Total Bilirubin: 0.5 mg/dL (ref 0.2–1.2)
Total Protein: 7.4 g/dL (ref 6.0–8.3)

## 2021-03-02 LAB — LDL CHOLESTEROL, DIRECT: Direct LDL: 104 mg/dL

## 2021-03-02 NOTE — Patient Instructions (Addendum)
Your blood sugars are too high  in the morning BUT  your A1c is fine! It's probably due to your nighttime meals.   You need to eat better  at night:   your kids are right!  Try the Healthy Choice "low carb power bowl"  entrees and  "Steamer" entrees .  There are are great low carb entrees that microwave in 5 minutes    INSTEAD OF NABS AND POPCORN    Starting tonight take the flomax (tamsulosin) at bedtime to see if it helps the urination problem in the morning

## 2021-03-02 NOTE — Progress Notes (Addendum)
Subjective:  [name], male    DOB: 07/30/1945  Age: 75 y.o. MRN: [id number]  CC: The primary encounter diagnosis was Controlled type 2 diabetes mellitus with diabetic nephropathy, without long-term current use of insulin ([place]). Diagnoses of Stage 3a chronic kidney disease ([place]), Mixed hyperlipidemia, COVID-19 vaccine administered, Urinary frequency, Adenomatous polyp of colon, unspecified part of colon, Hyperlipidemia associated with type 2 diabetes mellitus ([place]), Elevated blood pressure reading in office with white coat syndrome, with diagnosis of hypertension, and Acute gout involving toe, unspecified cause, unspecified laterality were also pertinent to this visit.  HPI Nathaniel Lane presents for  Chief Complaint  Patient presents with   Follow-up    diabetes   This visit occurred during the SARS-CoV-2 public health emergency.  Safety protocols were in place, including screening questions prior to the visit, additional usage of staff PPE, and extensive cleaning of exam room while observing appropriate contact time as indicated for disinfecting solutions.   T2DM:  taking a sulfonylurea , biguanide and SGLT2  inhibitor. home BS log reviewed: he has only been checking fastings , and 98% are out of range .  Taking statin and ARB. Labs were attempted 3 months ago but not obtained; he never returned for repeat attempt.   Hypertension: patient checks blood pressure twice weekly at home.  Readings have been for the most part < 140/80 at rest . 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  Several days per week,  eats most meals out of the home.   Outpatient Medications Prior to Visit  Medication Sig Dispense Refill   aspirin 81 MG EC tablet TAKE 1 TABLET (81 MG TOTAL) BY MOUTH DAILY. SWALLOW WHOLE. 90  tablet 3   blood glucose meter kit and supplies KIT Dispense based on patient and insurance preference. Use up to four times daily as directed. (FOR ICD-10 E11.29). 1 each 0   gabapentin (NEURONTIN) 300 MG capsule TAKE 1 CAPSULE BY MOUTH THREE TIMES A DAY 270 capsule 2   INVOKAMET 50-500 MG TABS TAKE 1 TABLET BY MOUTH TWICE A DAY 60 tablet 2   levothyroxine (SYNTHROID, LEVOTHROID) 150 MCG tablet TAKE ONE TABLET BY MOUTH ONCE DAILY BEFORE BREAKFAST 90 tablet 1   losartan (COZAAR) 50 MG tablet TAKE 1 TABLET BY MOUTH EVERY DAY 90 tablet 3   omeprazole (PRILOSEC) 20 MG capsule Take 20 mg by mouth daily.     ONETOUCH DELICA LANCETS FINE MISC USE AS DIRECTED 4 TIMES A DAY 100 each 2   ONETOUCH ULTRA test strip USE AS DIRECTED 4 TIMES A DAY 100 strip 2   PROAIR HFA 108 (90 Base) MCG/ACT inhaler TAKE 2 PUFFS BY MOUTH EVERY 6 HOURS AS NEEDED FOR WHEEZE OR SHORTNESS OF BREATH 18 g 0   tamsulosin (FLOMAX) 0.4 MG CAPS capsule TAKE 1 CAPSULE BY MOUTH EVERY DAY 90 capsule 1   atorvastatin (LIPITOR) 20 MG tablet TAKE 1 TABLET BY MOUTH EVERY DAY 90 tablet 1   glipiZIDE (GLUCOTROL XL) 5 MG 24 hr tablet TAKE 1 TABLET BY MOUTH EVERY DAY WITH BREAKFAST 90 tablet 1   omeprazole (PRILOSEC) 40 MG capsule TAKE 1 CAPSULE BY MOUTH EVERY DAY (Patient not taking: Reported on 03/02/2021) 90 capsule 1   No facility-administered medications prior to visit.  Review of Systems;  Patient denies headache, fevers, malaise, unintentional weight loss, skin rash, eye pain, sinus congestion and sinus pain, sore throat, dysphagia,  hemoptysis , cough, dyspnea, wheezing, chest pain, palpitations, orthopnea, edema, abdominal pain, nausea, melena, diarrhea, constipation, flank pain, dysuria, hematuria, urinary  Frequency, nocturia, numbness, tingling, seizures,  Focal weakness, Loss of consciousness,  Tremor, insomnia, depression, anxiety, and suicidal ideation.      Objective:  BP 138/84 (BP Location: Left Arm, Patient Position:  Sitting, Cuff Size: Normal)   Pulse 72   Temp 97.6 F (36.4 C) (Oral)   Ht $R'5\' 11"'rD$  (1.803 m)   Wt 182 lb 6.4 oz (82.7 kg)   SpO2 96%   BMI 25.44 kg/m   BP Readings from Last 3 Encounters:  03/02/21 138/84  11/28/20 136/82  02/19/20 120/80    Wt Readings from Last 3 Encounters:  03/02/21 182 lb 6.4 oz (82.7 kg)  11/28/20 182 lb 3.2 oz (82.6 kg)  08/02/20 182 lb (82.6 kg)    General appearance: alert, cooperative and appears stated age Ears: normal TM's and external ear canals both ears Throat: lips, mucosa, and tongue normal; teeth and gums normal Neck: no adenopathy, no carotid bruit, supple, symmetrical, trachea midline and thyroid not enlarged, symmetric, no tenderness/mass/nodules Back: symmetric, no curvature. ROM normal. No CVA tenderness. Lungs: clear to auscultation bilaterally Heart: regular rate and rhythm, S1, S2 normal, no murmur, click, rub or gallop Abdomen: soft, non-tender; bowel sounds normal; no masses,  no organomegaly Pulses: 2+ and symmetric Skin: Skin color, texture, turgor normal. No rashes or lesions Lymph nodes: Cervical, supraclavicular, and axillary nodes normal.  Lab Results  Component Value Date   HGBA1C 6.5 (A) 03/02/2021   HGBA1C 6.6 (H) 02/24/2020   HGBA1C 7.8 (A) 11/18/2019    Lab Results  Component Value Date   CREATININE 1.95 (H) 03/02/2021   CREATININE 2.01 (H) 02/24/2020   CREATININE 1.84 (H) 11/18/2019    Lab Results  Component Value Date   WBC 8.6 08/09/2016   HGB 14.5 08/09/2016   HCT 44.2 08/09/2016   PLT 152.0 08/09/2016   GLUCOSE 119 (H) 03/02/2021   CHOL 178 03/02/2021   TRIG 334.0 (H) 03/02/2021   HDL 34.00 (L) 03/02/2021   LDLDIRECT 104.0 03/02/2021   LDLCALC 76 02/24/2020   ALT 24 03/02/2021   AST 17 03/02/2021   NA 138 03/02/2021   K 5.0 03/02/2021   CL 103 03/02/2021   CREATININE 1.95 (H) 03/02/2021   BUN 37 (H) 03/02/2021   CO2 26 03/02/2021   TSH 1.85 01/28/2018   PSA 1.14 11/18/2019   INR 1.2  09/27/2011   HGBA1C 6.5 (A) 03/02/2021   MICROALBUR 4.1 (H) 11/18/2019    No results found.  Assessment & Plan:   Problem List Items Addressed This Visit       Unprioritized   Polyp of colon, adenomatous   Relevant Orders   Ambulatory referral to Gastroenterology   Hyperlipidemia associated with type 2 diabetes mellitus ([place])    LDL and triglycerides are elevated.  Will increase statin dose   Lab Results  Component Value Date   HGBA1C 6.5 (A) 03/02/2021   Lab Results  Component Value Date   CHOL 178 03/02/2021   HDL 34.00 (L) 03/02/2021   LDLCALC 76 02/24/2020   LDLDIRECT 104.0 03/02/2021   TRIG 334.0 (H) 03/02/2021   CHOLHDL 5 03/02/2021   Lab Results  Component Value Date   ALT 24 03/02/2021   AST 17 03/02/2021  ALKPHOS 99 03/02/2021   BILITOT 0.5 03/02/2021         Relevant Medications   atorvastatin (LIPITOR) 40 MG tablet   Elevated blood pressure reading in office with white coat syndrome, with diagnosis of hypertension    Home readings on a validated machine are at goal . Continue losartan  Lab Results  Component Value Date   CREATININE 1.95 (H) 03/02/2021   Lab Results  Component Value Date   NA 138 03/02/2021   K 5.0 03/02/2021   CL 103 03/02/2021   CO2 26 03/02/2021         Relevant Medications   atorvastatin (LIPITOR) 40 MG tablet   Type 2 diabetes mellitus, controlled, with renal complications ([place]) - Primary    Well controlled on current regimen,  But needs to stop glipizide given GFR .  Continue Invokamet.  GLP 1 agonist considered; however he has a history of thyroid cancer .  Lab Results  Component Value Date   HGBA1C 6.5 (A) 03/02/2021   Lab Results  Component Value Date   LABMICR 56.6 11/28/2020   MICROALBUR 4.1 (H) 11/18/2019   MICROALBUR 7.2 (H) 01/28/2018     Lab Results  Component Value Date   CREATININE 1.95 (H) 03/02/2021         Relevant Medications   atorvastatin (LIPITOR) 40 MG tablet   Other Relevant  Orders   POCT HgB A1C (Completed)   Ambulatory referral to Nephrology   CKD stage 3 due to type 2 diabetes mellitus (HCC)    Stopping glipizide.  continue ARB and SGLT2 inhibitor.   Potassium is slowly rising,  Will refer to nephrology if not seeing       Relevant Medications   atorvastatin (LIPITOR) 40 MG tablet   RESOLVED: Gout attack    PREDNISONE TAPER PRESCRIBED GIVEN HIS NEW DIAGNOSIS OF CKD       Other Visit Diagnoses     COVID-19 vaccine administered       Urinary frequency           I spent 30 minutes dedicated to the care of this patient on the date of this encounter to include pre-visit review of his medical history,  Face-to-face time with the patient , and post visit ordering of testing and therapeutics.   Meds ordered this encounter  Medications   atorvastatin (LIPITOR) 40 MG tablet    Sig: Take 1 tablet (40 mg total) by mouth daily.    Dispense:  90 tablet    Refill:  1   predniSONE (DELTASONE) 10 MG tablet    Sig: 6 tablets on Day 1 , then reduce by 1 tablet daily until gone    Dispense:  21 tablet    Refill:  0    Medications Discontinued During This Encounter  Medication Reason   omeprazole (PRILOSEC) 40 MG capsule    atorvastatin (LIPITOR) 20 MG tablet    glipiZIDE (GLUCOTROL XL) 5 MG 24 hr tablet     Follow-up: Return in about 6 months (around 08/30/2021) for follow up diabetes.   Crecencio Mc, MD

## 2021-03-03 LAB — URINE CULTURE
MICRO NUMBER:: 12347594
Result:: NO GROWTH
SPECIMEN QUALITY:: ADEQUATE

## 2021-03-04 MED ORDER — ATORVASTATIN CALCIUM 40 MG PO TABS: 40.0000 mg | ORAL_TABLET | Freq: Every day | ORAL | 1 refills | Status: DC

## 2021-03-04 NOTE — Assessment & Plan Note (Signed)
Stopping glipizide.  continue ARB and SGLT2 inhibitor.   Potassium is slowly rising,  Will refer to nephrology if not seeing

## 2021-03-04 NOTE — Assessment & Plan Note (Addendum)
Well controlled on current regimen,  But needs to stop glipizide given GFR .  Continue Invokamet.  GLP 1 agonist considered; however he has a history of thyroid cancer .  Lab Results  Component Value Date   HGBA1C 6.5 (A) 03/02/2021   Lab Results  Component Value Date   LABMICR 56.6 11/28/2020   MICROALBUR 4.1 (H) 11/18/2019   MICROALBUR 7.2 (H) 01/28/2018     Lab Results  Component Value Date   CREATININE 1.95 (H) 03/02/2021

## 2021-03-04 NOTE — Assessment & Plan Note (Signed)
Home readings on a validated machine are at goal . Continue losartan  Lab Results  Component Value Date   CREATININE 1.95 (H) 03/02/2021   Lab Results  Component Value Date   NA 138 03/02/2021   K 5.0 03/02/2021   CL 103 03/02/2021   CO2 26 03/02/2021

## 2021-03-04 NOTE — Assessment & Plan Note (Addendum)
LDL and triglycerides are elevated.  Will increase statin dose   Lab Results  Component Value Date   HGBA1C 6.5 (A) 03/02/2021   Lab Results  Component Value Date   CHOL 178 03/02/2021   HDL 34.00 (L) 03/02/2021   LDLCALC 76 02/24/2020   LDLDIRECT 104.0 03/02/2021   TRIG 334.0 (H) 03/02/2021   CHOLHDL 5 03/02/2021   Lab Results  Component Value Date   ALT 24 03/02/2021   AST 17 03/02/2021   ALKPHOS 99 03/02/2021   BILITOT 0.5 03/02/2021

## 2021-03-06 ENCOUNTER — Telehealth: Payer: Self-pay | Admitting: Internal Medicine

## 2021-03-06 LAB — SARS-COV-2 SEMI-QUANTITATIVE TOTAL ANTIBODY, SPIKE: SARS COV2 AB, Total Spike Semi QN: >2500 U/mL — ABNORMAL HIGH (ref <0.8)

## 2021-03-06 NOTE — Telephone Encounter (Signed)
Patient has been informed.

## 2021-03-06 NOTE — Telephone Encounter (Signed)
Patient received your call from earlier but was not able to hear which medicine he should stop taking because he was driving.Please advise.

## 2021-03-07 ENCOUNTER — Other Ambulatory Visit (INDEPENDENT_AMBULATORY_CARE_PROVIDER_SITE_OTHER): Payer: Self-pay

## 2021-03-07 DIAGNOSIS — D126 Benign neoplasm of colon, unspecified: Secondary | ICD-10-CM

## 2021-03-07 MED ORDER — SUTAB 1479-225-188 MG PO TABS
ORAL_TABLET | ORAL | 0 refills | Status: DC
Start: 2021-03-07 — End: 2021-03-20

## 2021-03-07 NOTE — Progress Notes (Signed)
Gastroenterology Pre-Procedure Review  Request Date: 03/22/2021 Requesting Physician: Dr. Marius Ditch  PATIENT REVIEW QUESTIONS: The patient responded to the following health history questions as indicated:    1. Are you having any GI issues? no 2. Do you have a personal history of Polyps? yes (yes ) 3. Do you have a family history of Colon Cancer or Polyps? yes (dad polyps) 4. Diabetes Mellitus? yes (type 1) 5. Joint replacements in the past 12 months?no 6. Major health problems in the past 3 months?no 7. Any artificial heart valves, MVP, or defibrillator?yes (open heart surgery in 2007 and half lung removed )    MEDICATIONS & ALLERGIES:    Patient reports the following regarding taking any anticoagulation/antiplatelet therapy:   Plavix, Coumadin, Eliquis, Xarelto, Lovenox, Pradaxa, Brilinta, or Effient? no Aspirin? yes (26m)  Patient confirms/reports the following medications:  Current Outpatient Medications  Medication Sig Dispense Refill   aspirin 81 MG EC tablet TAKE 1 TABLET (81 MG TOTAL) BY MOUTH DAILY. SWALLOW WHOLE. 90 tablet 3   atorvastatin (LIPITOR) 40 MG tablet Take 1 tablet (40 mg total) by mouth daily. 90 tablet 1   blood glucose meter kit and supplies KIT Dispense based on patient and insurance preference. Use up to four times daily as directed. (FOR ICD-10 E11.29). 1 each 0   gabapentin (NEURONTIN) 300 MG capsule TAKE 1 CAPSULE BY MOUTH THREE TIMES A DAY 270 capsule 2   INVOKAMET 50-500 MG TABS TAKE 1 TABLET BY MOUTH TWICE A DAY 60 tablet 2   levothyroxine (SYNTHROID, LEVOTHROID) 150 MCG tablet TAKE ONE TABLET BY MOUTH ONCE DAILY BEFORE BREAKFAST 90 tablet 1   losartan (COZAAR) 50 MG tablet TAKE 1 TABLET BY MOUTH EVERY DAY 90 tablet 3   omeprazole (PRILOSEC) 20 MG capsule Take 20 mg by mouth daily.     ONETOUCH DELICA LANCETS FINE MISC USE AS DIRECTED 4 TIMES A DAY 100 each 2   ONETOUCH ULTRA test strip USE AS DIRECTED 4 TIMES A DAY 100 strip 2   PROAIR HFA 108 (90 Base)  MCG/ACT inhaler TAKE 2 PUFFS BY MOUTH EVERY 6 HOURS AS NEEDED FOR WHEEZE OR SHORTNESS OF BREATH 18 g 0   tamsulosin (FLOMAX) 0.4 MG CAPS capsule TAKE 1 CAPSULE BY MOUTH EVERY DAY 90 capsule 1   No current facility-administered medications for this visit.    Patient confirms/reports the following allergies:  Allergies  Allergen Reactions   Penicillins Swelling    Swelling in mouth    No orders of the defined types were placed in this encounter.   AUTHORIZATION INFORMATION Primary Insurance: 1D#: Group #:  Secondary Insurance: 1D#: Group #:  SCHEDULE INFORMATION: Date:  Time: Location:

## 2021-03-08 MED ORDER — PREDNISONE 10 MG PO TABS: ORAL_TABLET | ORAL | 0 refills | Status: DC

## 2021-03-08 NOTE — Addendum Note (Signed)
Addended by: Crecencio Mc on: 03/08/2021 02:45 PM   Modules accepted: Orders

## 2021-03-08 NOTE — Assessment & Plan Note (Signed)
PREDNISONE TAPER PRESCRIBED GIVEN HIS NEW DIAGNOSIS OF CKD

## 2021-03-14 ENCOUNTER — Other Ambulatory Visit: Payer: Self-pay | Admitting: Internal Medicine

## 2021-03-20 ENCOUNTER — Telehealth: Payer: Self-pay | Admitting: Gastroenterology

## 2021-03-20 ENCOUNTER — Other Ambulatory Visit: Payer: Self-pay

## 2021-03-20 ENCOUNTER — Telehealth: Payer: Self-pay

## 2021-03-20 MED ORDER — SUTAB 1479-225-188 MG PO TABS: 12.0000 | ORAL_TABLET | Freq: Once | ORAL | 0 refills | Status: DC

## 2021-03-20 MED ORDER — PEG 3350-KCL-NA BICARB-NACL 420 G PO SOLR: 4000.0000 mL | Freq: Once | ORAL | 0 refills | Status: AC

## 2021-03-20 NOTE — Telephone Encounter (Signed)
Pt. Says that prep has not been sent to his pharmacy. Requesting a call back

## 2021-03-20 NOTE — Telephone Encounter (Signed)
CALLED PATIENT ABOUT HIS PREP ALSO RESENT PREP AGAIN TO PHARMACY FOR PICK UP CALLED PHARMACY  AND FOUND OUT IT WAS 100 DOLLARS SO SENT IN A CHEAPER PREP

## 2021-03-21 ENCOUNTER — Encounter: Payer: Self-pay | Admitting: Gastroenterology

## 2021-03-22 ENCOUNTER — Ambulatory Visit
Admission: RE | Admit: 2021-03-22 | Discharge: 2021-03-22 | Disposition: A | Discharge status: Home / Self Care | Payer: PPO | Attending: Gastroenterology | Admitting: Gastroenterology

## 2021-03-22 ENCOUNTER — Encounter
Admission: RE | Disposition: A | Discharge status: Home / Self Care | Payer: Self-pay | Source: Home / Self Care | Attending: Gastroenterology

## 2021-03-22 ENCOUNTER — Ambulatory Visit: Payer: PPO | Admitting: Anesthesiology

## 2021-03-22 DIAGNOSIS — D123 Benign neoplasm of transverse colon: Secondary | ICD-10-CM | POA: Diagnosis not present

## 2021-03-22 DIAGNOSIS — D126 Benign neoplasm of colon, unspecified: Secondary | ICD-10-CM | POA: Diagnosis not present

## 2021-03-22 DIAGNOSIS — D120 Benign neoplasm of cecum: Secondary | ICD-10-CM | POA: Insufficient documentation

## 2021-03-22 DIAGNOSIS — K644 Residual hemorrhoidal skin tags: Secondary | ICD-10-CM | POA: Insufficient documentation

## 2021-03-22 DIAGNOSIS — Z87891 Personal history of nicotine dependence: Secondary | ICD-10-CM | POA: Insufficient documentation

## 2021-03-22 DIAGNOSIS — Z79899 Other long term (current) drug therapy: Secondary | ICD-10-CM | POA: Diagnosis not present

## 2021-03-22 DIAGNOSIS — Z85118 Personal history of other malignant neoplasm of bronchus and lung: Secondary | ICD-10-CM | POA: Insufficient documentation

## 2021-03-22 DIAGNOSIS — Z1211 Encounter for screening for malignant neoplasm of colon: Secondary | ICD-10-CM | POA: Diagnosis not present

## 2021-03-22 DIAGNOSIS — Z951 Presence of aortocoronary bypass graft: Secondary | ICD-10-CM | POA: Diagnosis not present

## 2021-03-22 DIAGNOSIS — F419 Anxiety disorder, unspecified: Secondary | ICD-10-CM | POA: Diagnosis not present

## 2021-03-22 DIAGNOSIS — Z86010 Personal history of colonic polyps: Secondary | ICD-10-CM | POA: Insufficient documentation

## 2021-03-22 DIAGNOSIS — Z85850 Personal history of malignant neoplasm of thyroid: Secondary | ICD-10-CM | POA: Insufficient documentation

## 2021-03-22 DIAGNOSIS — K5730 Diverticulosis of large intestine without perforation or abscess without bleeding: Secondary | ICD-10-CM | POA: Diagnosis not present

## 2021-03-22 DIAGNOSIS — Z7982 Long term (current) use of aspirin: Secondary | ICD-10-CM | POA: Diagnosis not present

## 2021-03-22 DIAGNOSIS — Z79890 Hormone replacement therapy (postmenopausal): Secondary | ICD-10-CM | POA: Diagnosis not present

## 2021-03-22 DIAGNOSIS — Z880 Allergy status to penicillin: Secondary | ICD-10-CM | POA: Insufficient documentation

## 2021-03-22 DIAGNOSIS — K635 Polyp of colon: Secondary | ICD-10-CM

## 2021-03-22 HISTORY — PX: COLONOSCOPY WITH PROPOFOL: SHX5780

## 2021-03-22 LAB — GLUCOSE, CAPILLARY: Glucose-Capillary: 168 mg/dL — ABNORMAL HIGH (ref 70–99)

## 2021-03-22 SURGERY — COLONOSCOPY WITH PROPOFOL
Anesthesia: General

## 2021-03-22 MED ORDER — PROPOFOL 500 MG/50ML IV EMUL
INTRAVENOUS | Status: AC
  Filled 2021-03-22: qty 50

## 2021-03-22 MED ORDER — PROPOFOL 500 MG/50ML IV EMUL
INTRAVENOUS | Status: DC | PRN
  Administered 2021-03-22: 150 ug/kg/min via INTRAVENOUS

## 2021-03-22 MED ORDER — PHENYLEPHRINE HCL (PRESSORS) 10 MG/ML IV SOLN
INTRAVENOUS | Status: DC | PRN
Start: 2021-03-22 — End: 2021-03-22
  Administered 2021-03-22: 200 ug via INTRAVENOUS

## 2021-03-22 MED ORDER — SODIUM CHLORIDE 0.9 % IV SOLN
INTRAVENOUS | Status: DC
  Administered 2021-03-22: 20 mL/h via INTRAVENOUS

## 2021-03-22 MED ORDER — PROPOFOL 10 MG/ML IV BOLUS
INTRAVENOUS | Status: DC | PRN
  Administered 2021-03-22: 90 mg via INTRAVENOUS

## 2021-03-22 MED ORDER — PHENYLEPHRINE HCL (PRESSORS) 10 MG/ML IV SOLN
INTRAVENOUS | Status: AC
  Filled 2021-03-22: qty 1

## 2021-03-22 MED ORDER — LIDOCAINE HCL (CARDIAC) PF 100 MG/5ML IV SOSY
PREFILLED_SYRINGE | INTRAVENOUS | Status: DC | PRN
  Administered 2021-03-22: 40 mg via INTRAVENOUS

## 2021-03-22 NOTE — Anesthesia Procedure Notes (Signed)
Date/Time: 03/22/2021 7:50 AM Performed by: Doreen Salvage, CRNA Pre-anesthesia Checklist: Patient identified, Emergency Drugs available, Suction available and Patient being monitored Patient Re-evaluated:Patient Re-evaluated prior to induction Oxygen Delivery Method: Nasal cannula Induction Type: IV induction Dental Injury: Teeth and Oropharynx as per pre-operative assessment  Comments: Nasal cannula with etCO2 monitoring

## 2021-03-22 NOTE — Op Note (Signed)
[facility] Patient Name: Nathaniel Lane Procedure Date: 03/22/2021 7:13 AM MRN: [id number] Account #: [id number] Date of Birth: 1945-09-05 Admit Type: Outpatient Age: 75 Room: [facility] ENDO ROOM 4 Gender: Male Note Status: Finalized Instrument Name: Jasper Riling [id number] Procedure:             Colonoscopy Indications:           Surveillance: Personal history of adenomatous polyps                         on last colonoscopy 5 years ago, Last colonoscopy:                         November 2017 Providers:             Lin Landsman MD, MD Referring MD:          Deborra Medina, MD (Referring MD) Medicines:             General Anesthesia Complications:         No immediate complications. Estimated blood loss: None. Procedure:             Pre-Anesthesia Assessment:                        - Prior to the procedure, a History and Physical was                         performed, and patient medications and allergies were                         reviewed. The patient is competent. The risks and                         benefits of the procedure and the sedation options and                         risks were discussed with the patient. All questions                         were answered and informed consent was obtained.                         Patient identification and proposed procedure were                         verified by the physician, the nurse, the                         anesthesiologist, the anesthetist and the technician                         in the pre-procedure area in the procedure room in the                         endoscopy suite. Mental Status Examination: alert and                         oriented. Airway Examination: normal oropharyngeal  airway and neck mobility. Respiratory Examination:                         clear to auscultation. CV Examination: normal.                         Prophylactic Antibiotics: The patient  does not require                         prophylactic antibiotics. Prior Anticoagulants: The                         patient has taken no previous anticoagulant or                         antiplatelet agents. ASA Grade Assessment: III - A                         patient with severe systemic disease. After reviewing                         the risks and benefits, the patient was deemed in                         satisfactory condition to undergo the procedure. The                         anesthesia plan was to use general anesthesia.                         Immediately prior to administration of medications,                         the patient was re-assessed for adequacy to receive                         sedatives. The heart rate, respiratory rate, oxygen                         saturations, blood pressure, adequacy of pulmonary                         ventilation, and response to care were monitored                         throughout the procedure. The physical status of the                         patient was re-assessed after the procedure.                        After obtaining informed consent, the colonoscope was                         passed under direct vision. Throughout the procedure,                         the patient's blood pressure, pulse, and oxygen  saturations were monitored continuously. The                         Colonoscope was introduced through the anus and                         advanced to the the cecum, identified by appendiceal                         orifice and ileocecal valve. The colonoscopy was                         performed with moderate difficulty due to significant                         looping. Successful completion of the procedure was                         aided by applying abdominal pressure. The patient                         tolerated the procedure well. The quality of the bowel                         preparation  was evaluated using the BBPS [facility] Bowel                         Preparation Scale) with scores of: Right Colon = 2                         (minor amount of residual staining, small fragments of                         stool and/or opaque liquid, but mucosa seen well),                         Transverse Colon = 2 (minor amount of residual                         staining, small fragments of stool and/or opaque                         liquid, but mucosa seen well) and Left Colon = 2                         (minor amount of residual staining, small fragments of                         stool and/or opaque liquid, but mucosa seen well). The                         total BBPS score equals 6. Findings:      The perianal and digital rectal examinations were normal. Pertinent       negatives include normal sphincter tone and no palpable rectal lesions.      Seven sessile polyps were found in the transverse colon 4 and cecum 3.  The polyps were 3 to 5 mm in size. These polyps were removed with a cold       snare. Resection and retrieval were complete.      A few diverticula were found in the sigmoid colon.      Non-bleeding external hemorrhoids were found during retroflexion. The       hemorrhoids were small. Impression:            - Seven 3 to 5 mm polyps in the transverse colon and                         in the cecum, removed with a cold snare. Resected and                         retrieved.                        - Diverticulosis in the sigmoid colon.                        - Non-bleeding external hemorrhoids. Recommendation:        - Discharge patient to home (with escort).                        - Resume previous diet today.                        - Continue present medications.                        - Await pathology results.                        - Repeat colonoscopy in 3 years with 2 day prep for                         surveillance of multiple polyps. Procedure Code(s):      --- Professional ---                        [phone], Colonoscopy, flexible; with removal of                         tumor(s), polyp(s), or other lesion(s) by snare                         technique Diagnosis Code(s):     --- Professional ---                        Z86.010, Personal history of colonic polyps                        K63.5, Polyp of colon                        K64.4, Residual hemorrhoidal skin tags                        K57.30, Diverticulosis of large intestine without  perforation or abscess without bleeding CPT copyright 2019 American Medical Association. All rights reserved. The codes documented in this report are preliminary and upon coder review may  be revised to meet current compliance requirements. Dr. Ulyess Mort Lin Landsman MD, MD 03/22/2021 8:22:29 AM This report has been signed electronically. Number of Addenda: 0 Note Initiated On: 03/22/2021 7:13 AM Scope Withdrawal Time: 0 hours 16 minutes 2 seconds  Total Procedure Duration: 0 hours 22 minutes 3 seconds  Estimated Blood Loss:  Estimated blood loss: none.      [facility]

## 2021-03-22 NOTE — Anesthesia Postprocedure Evaluation (Signed)
Anesthesia Post Note  Patient: [name]  Procedure(s) Performed: COLONOSCOPY WITH PROPOFOL  Patient location during evaluation: Endoscopy Anesthesia Type: General Level of consciousness: awake and alert and oriented Pain management: pain level controlled Vital Signs Assessment: post-procedure vital signs reviewed and stable Respiratory status: spontaneous breathing, nonlabored ventilation and respiratory function stable Cardiovascular status: blood pressure returned to baseline and stable Postop Assessment: no signs of nausea or vomiting Anesthetic complications: no   No notable events documented.   Last Vitals:  Vitals:   03/22/21 0831 03/22/21 0841  BP: 107/82 119/85  Pulse: 74   Resp: 18 (!) 9  Temp:    SpO2: 98% 98%    Last Pain:  Vitals:   03/22/21 0831  TempSrc:   PainSc: 0-No pain                 Junette Bernat

## 2021-03-22 NOTE — Transfer of Care (Signed)
Immediate Anesthesia Transfer of Care Note  Patient: Nathaniel Lane  Procedure(s) Performed: Procedure(s): COLONOSCOPY WITH PROPOFOL (N/A)  Patient Location: PACU and Endoscopy Unit  Anesthesia Type:General  Level of Consciousness: sedated  Airway & Oxygen Therapy: Patient Spontanous Breathing and Patient connected to nasal cannula oxygen  Post-op Assessment: Report given to RN and Post -op Vital signs reviewed and stable  Post vital signs: Reviewed and stable  Last Vitals:  Vitals:   03/22/21 0650 03/22/21 0821  BP: (!) 115/95 108/72  Pulse: 86 84  Resp: 20 (!) 23  Temp: 36.4 C (!) 35.6 C  SpO2: 014% 99%    Complications: No apparent anesthesia complications

## 2021-03-22 NOTE — H&P (Signed)
Cephas Darby, MD [address]  [place]  [place], [place] [address]  Main: [phone]  Fax: [phone] Pager: [phone]  Primary Care Physician:  Crecencio Mc, MD Primary Gastroenterologist:  Dr. Cephas Darby  Pre-Procedure History & Physical: HPI:  Nathaniel Lane is a 75 y.o. male is here for an colonoscopy.   Past Medical History:  Diagnosis Date   Cancer [facility]) 2012   Stage IIA squamous cell Lung Ca   Diabetes mellitus    Gout    Hyperlipidemia    Hypertension    Myocardial infarction [facility])    Thyroid cancer ([place]) 2013   s/p thyroidectomy 2014    Past Surgical History:  Procedure Laterality Date   COLONOSCOPY N/A 04/25/2016   Procedure: COLONOSCOPY;  Surgeon: Manya Silvas, MD;  Location: [facility];  Service: Endoscopy;  Laterality: N/A;  Diabetic (oral meds)   CORONARY ARTERY BYPASS GRAFT  2007   LUNG LOBECTOMY     TOTAL THYROIDECTOMY N/A     Prior to Admission medications   Medication Sig Start Date End Date Taking? Authorizing Provider  aspirin (ASPIRIN LOW DOSE) 81 MG EC tablet TAKE 1 TABLET (81 MG TOTAL) BY MOUTH DAILY. SWALLOW WHOLE. 03/14/21  Yes Crecencio Mc, MD  atorvastatin (LIPITOR) 40 MG tablet Take 1 tablet (40 mg total) by mouth daily. 03/04/21  Yes Crecencio Mc, MD  blood glucose meter kit and supplies KIT Dispense based on patient and insurance preference. Use up to four times daily as directed. (FOR ICD-10 E11.29). 02/11/18  Yes Crecencio Mc, MD  gabapentin (NEURONTIN) 300 MG capsule TAKE 1 CAPSULE BY MOUTH THREE TIMES A DAY 12/27/20  Yes Crecencio Mc, MD  INVOKAMET 50-500 MG TABS TAKE 1 TABLET BY MOUTH TWICE A DAY 02/21/21  Yes Crecencio Mc, MD  levothyroxine (SYNTHROID, LEVOTHROID) 150 MCG tablet TAKE ONE TABLET BY MOUTH ONCE DAILY BEFORE BREAKFAST 02/05/18  Yes Crecencio Mc, MD  losartan (COZAAR) 50 MG tablet TAKE 1 TABLET BY MOUTH EVERY DAY 08/23/20  Yes Crecencio Mc, MD  omeprazole (PRILOSEC) 20 MG capsule  Take 20 mg by mouth daily. 12/29/20  Yes [provider]  [facility] DELICA LANCETS FINE MISC USE AS DIRECTED 4 TIMES A DAY 03/06/18  Yes Crecencio Mc, MD  [facility] ULTRA test strip USE AS DIRECTED 4 TIMES A DAY 06/06/20  Yes Einar Pheasant, MD  predniSONE (DELTASONE) 10 MG tablet 6 tablets on Day 1 , then reduce by 1 tablet daily until gone 03/08/21  Yes Crecencio Mc, MD  PROAIR HFA 108 [phone] Base) MCG/ACT inhaler TAKE 2 PUFFS BY MOUTH EVERY 6 HOURS AS NEEDED FOR WHEEZE OR SHORTNESS OF BREATH 07/10/19  Yes Crecencio Mc, MD  tamsulosin (FLOMAX) 0.4 MG CAPS capsule TAKE 1 CAPSULE BY MOUTH EVERY DAY 11/28/20  Yes Crecencio Mc, MD    Allergies as of 03/07/2021 - Review Complete 03/07/2021  Allergen Reaction Noted   Penicillins Swelling 10/27/2012    Family History  Problem Relation Age of Onset   Hypertension Other    Heart disease Father     Social History   Socioeconomic History   Marital status: Widowed    Spouse name: Not on file   Number of children: Not on file   Years of education: Not on file   Highest education level: Not on file  Occupational History   Occupation: Health visitor: retired  Tobacco Use  Smoking status: Former    Types: Cigarettes    Quit date: 07/15/2005    Years since quitting: 15.6   Smokeless tobacco: Never  Vaping Use   Vaping Use: Never used  Substance and Sexual Activity   Alcohol use: No   Drug use: No   Sexual activity: Not on file  Other Topics Concern   Not on file  Social History Narrative   Lives alone; wife passed away 06-23-2019   Social Determinants of Health   Financial Resource Strain: Low Risk    Difficulty of Paying Living Expenses: Not hard at all  Food Insecurity: No Food Insecurity   Worried About Charity fundraiser in the Last Year: Never true   [place] in the Last Year: Never true  Transportation Needs: No Transportation Needs   Lack of Transportation (Medical): No   Lack of  Transportation (Non-Medical): No  Physical Activity: Insufficiently Active   Days of Exercise per Week: 4 days   Minutes of Exercise per Session: 20 min  Stress: No Stress Concern Present   Feeling of Stress : Not at all  Social Connections: Unknown   Frequency of Communication with Friends and Family: More than three times a week   Frequency of Social Gatherings with Friends and Family: More than three times a week   Attends Religious Services: Not on file   Active Member of Clubs or Organizations: Yes   Attends Archivist Meetings: More than 4 times per year   Marital Status: Widowed  Human resources officer Violence: Not At Risk   Fear of Current or Ex-Partner: No   Emotionally Abused: No   Physically Abused: No   Sexually Abused: No    Review of Systems: See HPI, otherwise negative ROS  Physical Exam: BP (!) 115/95   Pulse 86   Temp 97.6 F (36.4 C) (Temporal)   Resp 20   Ht _0  (1.803 m)   Wt 83.5 kg   SpO2 100%   BMI 25.66 kg/m  General:   Alert,  pleasant and cooperative in NAD Head:  Normocephalic and atraumatic. Neck:  Supple; no masses or thyromegaly. Lungs:  Clear throughout to auscultation.    Heart:  Regular rate and rhythm. Abdomen:  Soft, nontender and nondistended. Normal bowel sounds, without guarding, and without rebound.   Neurologic:  Alert and  oriented x4;  grossly normal neurologically.  Impression/Plan: FRANCISCO OSTROVSKY is here for an colonoscopy to be performed for h/o colon adenomatous polyps  Risks, benefits, limitations, and alternatives regarding  colonoscopy have been reviewed with the patient.  Questions have been answered.  All parties agreeable.   Sherri Sear, MD  03/22/2021, 7:46 AM

## 2021-03-22 NOTE — Anesthesia Preprocedure Evaluation (Signed)
Anesthesia Evaluation  Patient identified by MRN, date of birth, ID band Patient awake    Reviewed: Allergy & Precautions, NPO status , Patient's Chart, lab work & pertinent test results  History of Anesthesia Complications Negative for: history of anesthetic complications  Airway Mallampati: III  TM Distance: >3 FB Neck ROM: Full    Dental  (+) Poor Dentition   Pulmonary neg sleep apnea, COPD, former smoker,    breath sounds clear to auscultation- rhonchi (-) wheezing      Cardiovascular hypertension, Pt. on medications + CAD, + Past MI, + CABG and +CHF   Rhythm:Regular Rate:Normal - Systolic murmurs and - Diastolic murmurs    Neuro/Psych neg Seizures Anxiety negative neurological ROS     GI/Hepatic negative GI ROS, Neg liver ROS,   Endo/Other  diabetesHypothyroidism   Renal/GU CRFRenal disease     Musculoskeletal negative musculoskeletal ROS (+)   Abdominal (+) - obese,   Peds  Hematology negative hematology ROS (+)   Anesthesia Other Findings Past Medical History: 2012: Cancer ([place])     Comment:  Stage IIA squamous cell Lung Ca No date: Diabetes mellitus No date: Gout No date: Hyperlipidemia No date: Hypertension No date: Myocardial infarction [facility]) 2013: Thyroid cancer ([place])     Comment:  s/p thyroidectomy 2014   Reproductive/Obstetrics                             Anesthesia Physical Anesthesia Plan  ASA: 3  Anesthesia Plan: General   Post-op Pain Management:    Induction: Intravenous  PONV Risk Score and Plan: 1 and Propofol infusion  Airway Management Planned: Natural Airway  Additional Equipment:   Intra-op Plan:   Post-operative Plan:   Informed Consent: I have reviewed the patients History and Physical, chart, labs and discussed the procedure including the risks, benefits and alternatives for the proposed anesthesia with the patient or authorized  representative who has indicated his/her understanding and acceptance.     Dental advisory given  Plan Discussed with: CRNA and Anesthesiologist  Anesthesia Plan Comments:         Anesthesia Quick Evaluation

## 2021-03-23 ENCOUNTER — Other Ambulatory Visit: Payer: Self-pay | Admitting: Internal Medicine

## 2021-03-23 ENCOUNTER — Encounter: Payer: Self-pay | Admitting: Gastroenterology

## 2021-03-23 LAB — SURGICAL PATHOLOGY

## 2021-03-24 ENCOUNTER — Encounter: Payer: Self-pay | Admitting: Gastroenterology

## 2021-03-29 ENCOUNTER — Other Ambulatory Visit: Payer: Self-pay | Admitting: Internal Medicine

## 2021-04-20 ENCOUNTER — Other Ambulatory Visit (HOSPITAL_BASED_OUTPATIENT_CLINIC_OR_DEPARTMENT_OTHER): Payer: Self-pay | Admitting: Nephrology

## 2021-04-20 ENCOUNTER — Other Ambulatory Visit: Payer: Self-pay | Admitting: Nephrology

## 2021-04-20 DIAGNOSIS — I10 Essential (primary) hypertension: Secondary | ICD-10-CM | POA: Diagnosis not present

## 2021-04-20 DIAGNOSIS — Z85118 Personal history of other malignant neoplasm of bronchus and lung: Secondary | ICD-10-CM | POA: Diagnosis not present

## 2021-04-20 DIAGNOSIS — R809 Proteinuria, unspecified: Secondary | ICD-10-CM | POA: Diagnosis not present

## 2021-04-20 DIAGNOSIS — R002 Palpitations: Secondary | ICD-10-CM | POA: Diagnosis not present

## 2021-04-20 DIAGNOSIS — R8290 Unspecified abnormal findings in urine: Secondary | ICD-10-CM | POA: Diagnosis not present

## 2021-04-20 DIAGNOSIS — E119 Type 2 diabetes mellitus without complications: Secondary | ICD-10-CM | POA: Diagnosis not present

## 2021-04-20 DIAGNOSIS — N1832 Chronic kidney disease, stage 3b: Secondary | ICD-10-CM | POA: Diagnosis not present

## 2021-04-20 DIAGNOSIS — E7849 Other hyperlipidemia: Secondary | ICD-10-CM | POA: Diagnosis not present

## 2021-04-20 DIAGNOSIS — N182 Chronic kidney disease, stage 2 (mild): Secondary | ICD-10-CM | POA: Diagnosis not present

## 2021-04-20 DIAGNOSIS — U071 COVID-19: Secondary | ICD-10-CM | POA: Diagnosis not present

## 2021-04-20 DIAGNOSIS — E1122 Type 2 diabetes mellitus with diabetic chronic kidney disease: Secondary | ICD-10-CM

## 2021-04-20 DIAGNOSIS — I255 Ischemic cardiomyopathy: Secondary | ICD-10-CM | POA: Diagnosis not present

## 2021-04-20 DIAGNOSIS — I5022 Chronic systolic (congestive) heart failure: Secondary | ICD-10-CM | POA: Diagnosis not present

## 2021-04-20 DIAGNOSIS — I2510 Atherosclerotic heart disease of native coronary artery without angina pectoris: Secondary | ICD-10-CM | POA: Diagnosis not present

## 2021-04-20 DIAGNOSIS — K219 Gastro-esophageal reflux disease without esophagitis: Secondary | ICD-10-CM | POA: Diagnosis not present

## 2021-04-20 DIAGNOSIS — Z951 Presence of aortocoronary bypass graft: Secondary | ICD-10-CM | POA: Diagnosis not present

## 2021-05-03 ENCOUNTER — Other Ambulatory Visit: Payer: Self-pay

## 2021-05-03 ENCOUNTER — Ambulatory Visit
Admission: RE | Admit: 2021-05-03 | Discharge: 2021-05-03 | Disposition: A | Discharge status: Home / Self Care | Payer: PPO | Source: Ambulatory Visit | Attending: Nephrology | Admitting: Nephrology

## 2021-05-03 DIAGNOSIS — R809 Proteinuria, unspecified: Secondary | ICD-10-CM | POA: Diagnosis not present

## 2021-05-03 DIAGNOSIS — N1832 Chronic kidney disease, stage 3b: Secondary | ICD-10-CM | POA: Diagnosis not present

## 2021-05-03 DIAGNOSIS — R8290 Unspecified abnormal findings in urine: Secondary | ICD-10-CM | POA: Insufficient documentation

## 2021-05-03 DIAGNOSIS — E1122 Type 2 diabetes mellitus with diabetic chronic kidney disease: Secondary | ICD-10-CM | POA: Diagnosis not present

## 2021-05-03 DIAGNOSIS — I10 Essential (primary) hypertension: Secondary | ICD-10-CM | POA: Diagnosis not present

## 2021-05-03 IMAGING — US US RENAL
1 series · 14 of 25 positions shown · non-contrast
Comparison: CT chest [DATE]

CLINICAL DATA: Protein urea

Hypertension type 2 diabetes
Stage IIIB chronic kidney disease
EXAM:
RENAL / URINARY TRACT ULTRASOUND COMPLETE

[Series 1: us renal · 103 acquisitions, 14 frames shown]
[im 1/103]
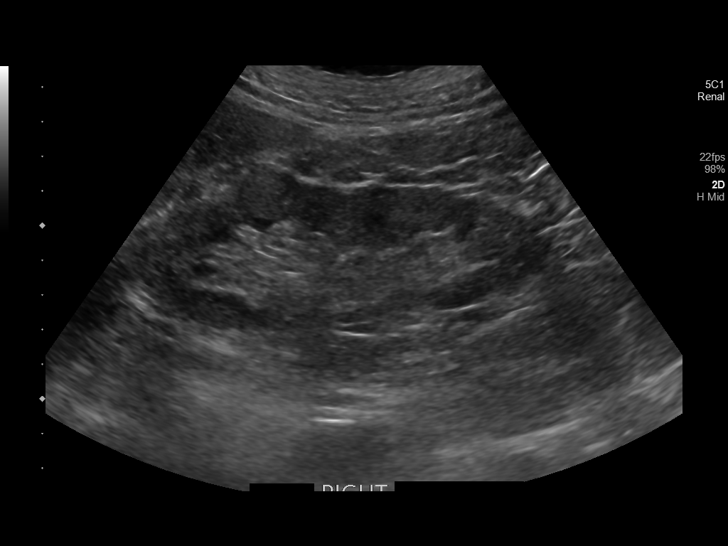
[im 9/103]
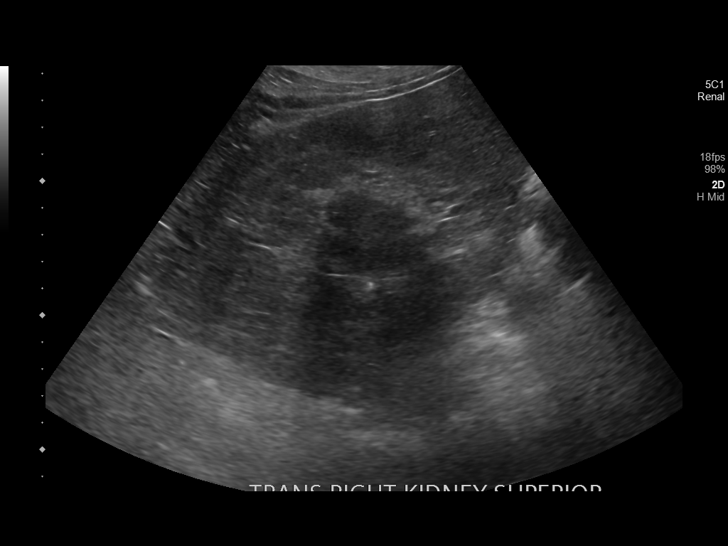
[im 18/103]
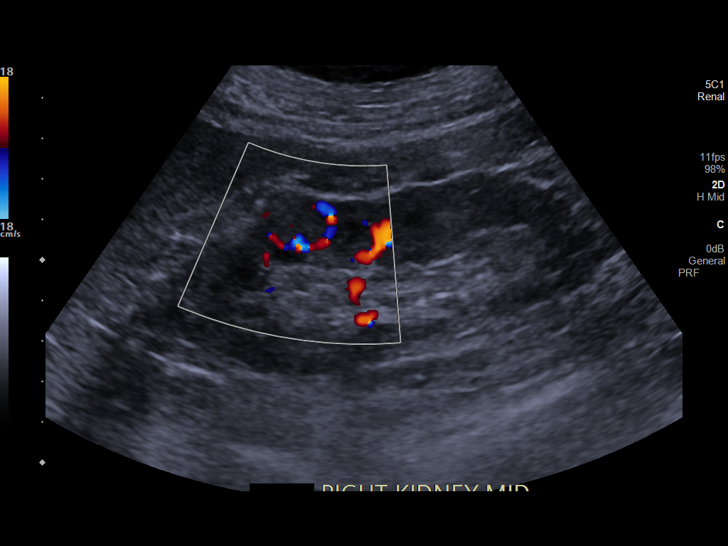
[im 26/103]
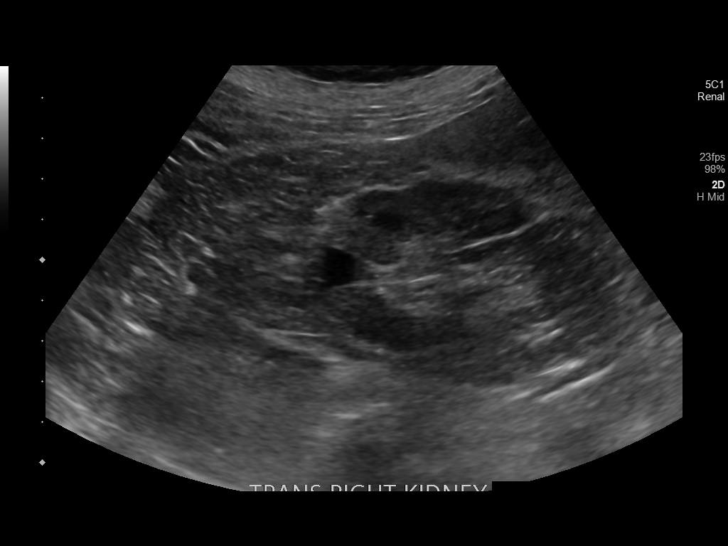
[im 35/103]
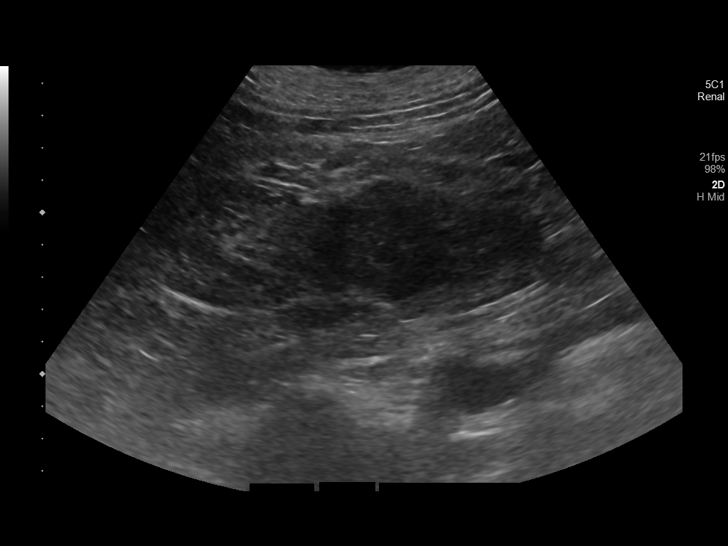
[im 39/103]
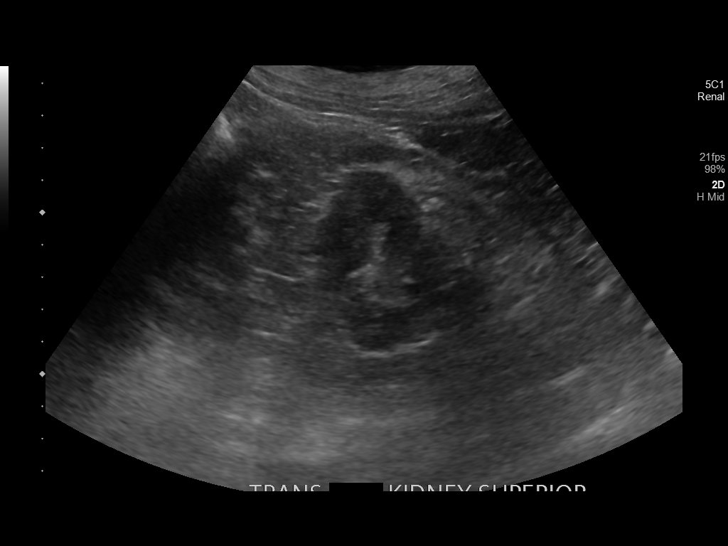
[im 47/103]
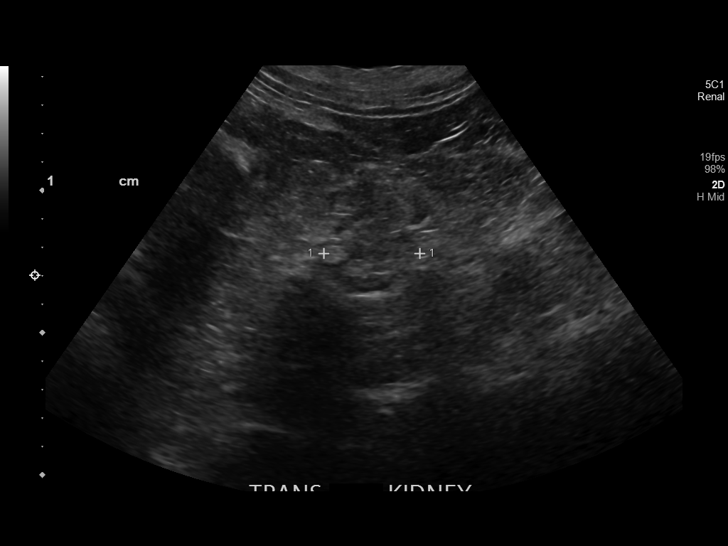
[im 56/103]
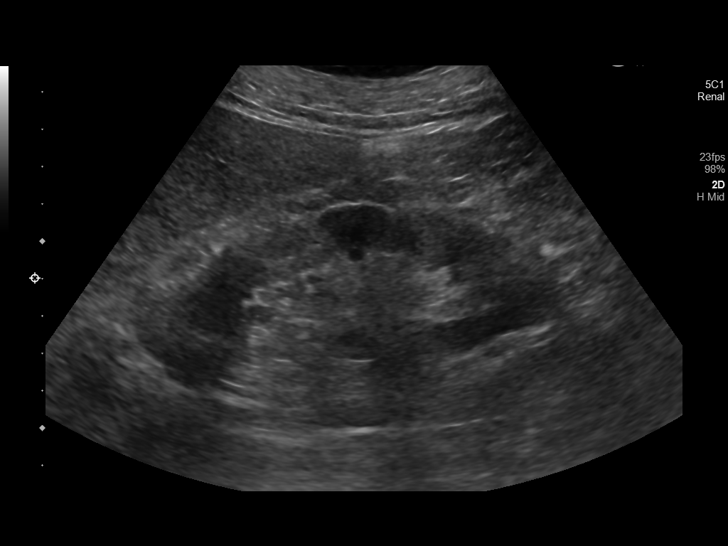
[im 64/103]
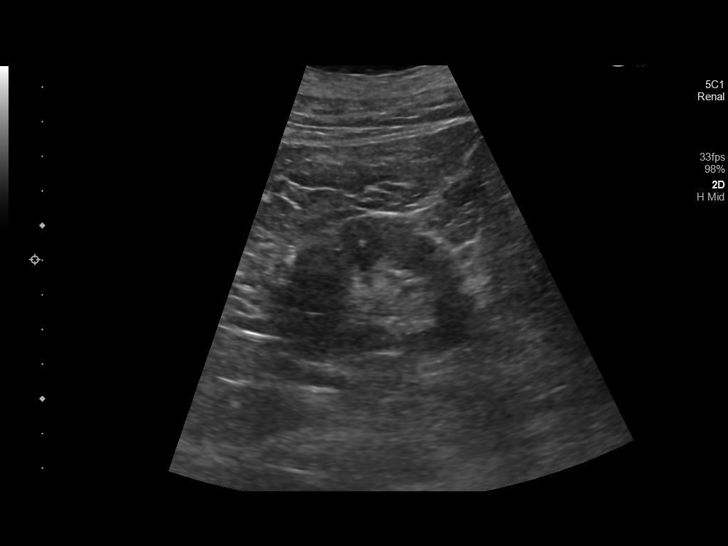
[im 69/103]
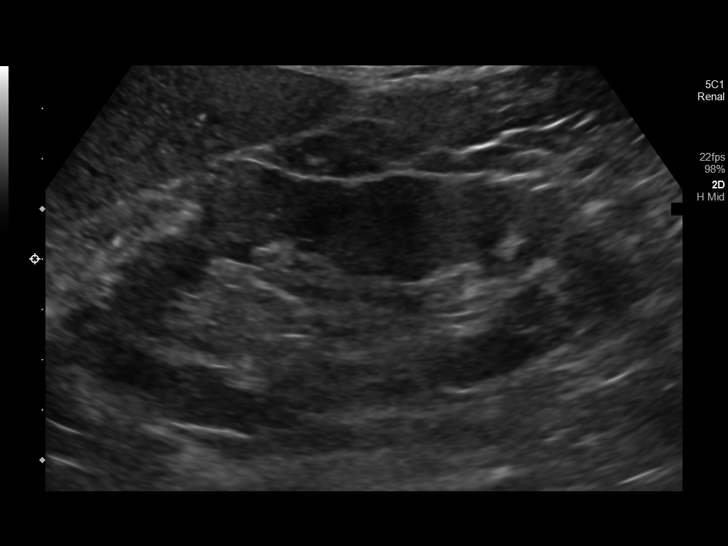
[im 77/103]
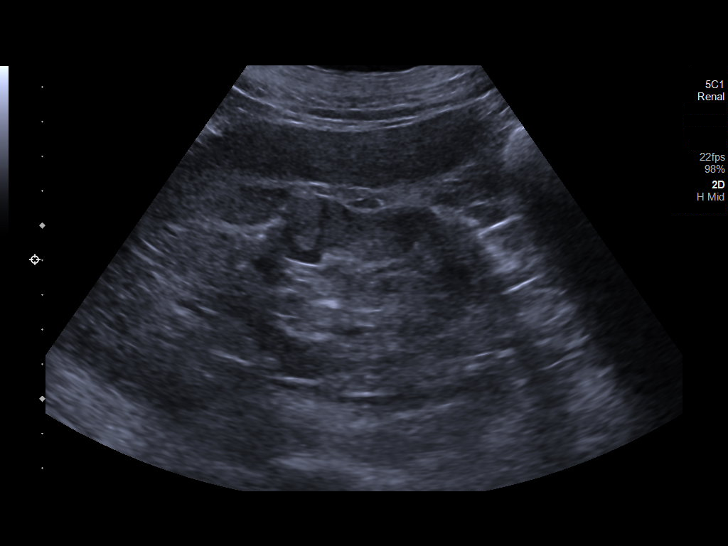
[im 86/103]
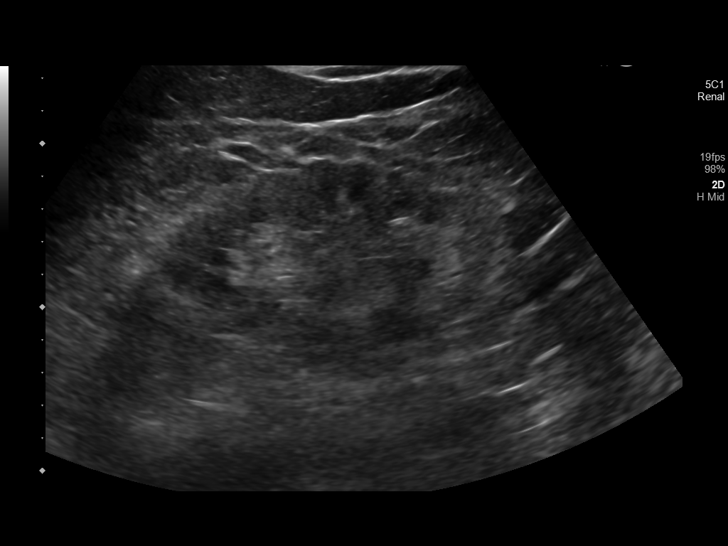
[im 94/103]
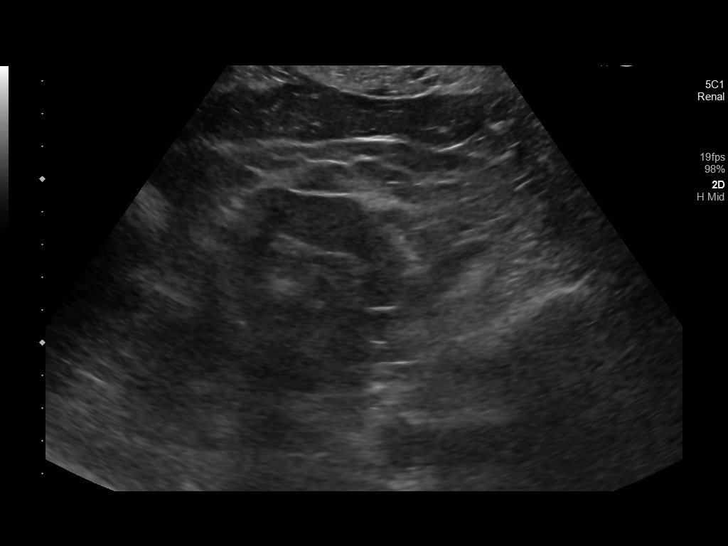
[im 103/103]
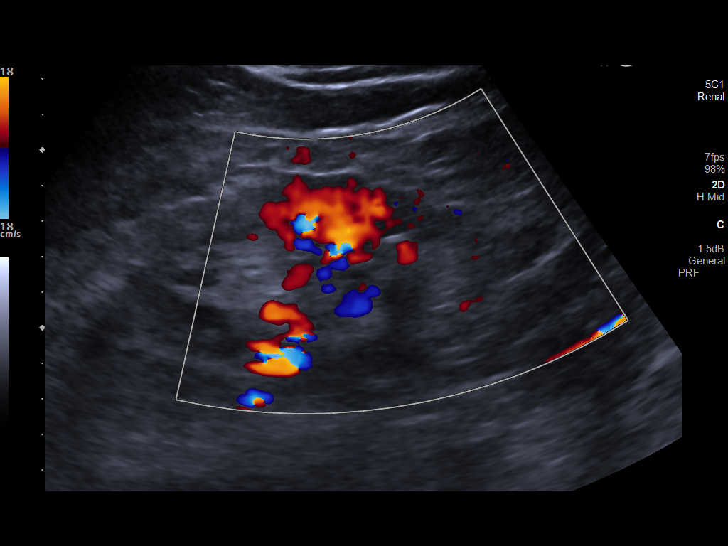

[14 of 25 positions shown; findings below may reference images not displayed]

FINDINGS: Right Kidney:

Renal measurements: 11.9 x 4.2 x 6.0 cm = volume: 158 mL.
Echogenicity within normal limits. 2.1 x 1.8 x 1.2 cm solid mass
noted in the mid kidney. 1.4 x 1.2 x 0.9 cm is simple cyst seen in
the lower pole.

Left Kidney:

Renal measurements: 12.0 x 5.2 x 5.7 cm = volume: 187 mL.
Echogenicity within normal limits. No mass or hydronephrosis
visualized.

Bladder:

Appears normal for degree of bladder distention.

Other:

Known left adrenal mass partially visualized.
IMPRESSION: 2.1 x 1.8 x 1.2 cm solid mass in the mid right kidney is suspicious
for renal neoplasm. Further evaluation with renal mass protocol CT
or MRI should be considered.

These results will be called to the ordering clinician or
representative by the Radiologist Assistant, and communication
documented in the PACS or [REDACTED].

## 2021-05-16 ENCOUNTER — Ambulatory Visit: Payer: Self-pay | Admitting: Urology

## 2021-05-23 ENCOUNTER — Other Ambulatory Visit: Payer: Self-pay

## 2021-05-23 ENCOUNTER — Encounter: Payer: Self-pay | Admitting: Urology

## 2021-05-23 ENCOUNTER — Other Ambulatory Visit: Payer: Self-pay | Admitting: Internal Medicine

## 2021-05-23 ENCOUNTER — Ambulatory Visit (INDEPENDENT_AMBULATORY_CARE_PROVIDER_SITE_OTHER): Payer: PPO | Admitting: Urology

## 2021-05-23 VITALS — BP 107/67 | HR 102 | Ht 71.0 in | Wt 173.0 lb

## 2021-05-23 DIAGNOSIS — N2889 Other specified disorders of kidney and ureter: Secondary | ICD-10-CM

## 2021-05-23 NOTE — Progress Notes (Signed)
   05/23/21 1:54 PM   Nathaniel Lane [birth year] [id number]  CC: Right renal mass  HPI: 75 year old male with history of diabetes, CAD, thyroid cancer who was recently found to have a 2 cm solid right renal mass on a renal ultrasound performed for CKD.  Baseline renal function creatinine around 2, EGFR ~35.  He denies any flank pain or gross hematuria.  He has mild urinary symptoms of weak stream in the morning and has been on Flomax long-term.  He has erectile dysfunction, but he is not interested in medications as his wife passed away.  No prior cross-sectional abdominal imaging to review.   PMH: Past Medical History:  Diagnosis Date   Cancer ([place]) 2012   Stage IIA squamous cell Lung Ca   Diabetes mellitus    Gout    Hyperlipidemia    Hypertension    Myocardial infarction [facility])    Thyroid cancer ([place]) 2013   s/p thyroidectomy 2014    Surgical History: Past Surgical History:  Procedure Laterality Date   COLONOSCOPY N/A 04/25/2016   Procedure: COLONOSCOPY;  Surgeon: Manya Silvas, MD;  Location: [facility];  Service: Endoscopy;  Laterality: N/A;  Diabetic (oral meds)   COLONOSCOPY WITH PROPOFOL N/A 03/22/2021   Procedure: COLONOSCOPY WITH PROPOFOL;  Surgeon: Lin Landsman, MD;  Location[facility] ENDOSCOPY;  Service: Gastroenterology;  Laterality: N/A;   CORONARY ARTERY BYPASS GRAFT  2007   LUNG LOBECTOMY     TOTAL THYROIDECTOMY N/A        Family History: Family History  Problem Relation Age of Onset   Hypertension Other    Heart disease Father     Social History:  reports that he quit smoking about 15 years ago. His smoking use included cigarettes. He has never used smokeless tobacco. He reports that he does not drink alcohol and does not use drugs.  Physical Exam: BP 107/67   Pulse (!) 102   Ht _0  (1.803 m)   Wt 173 lb (78.5 kg)   BMI 24.13 kg/m    Constitutional:  Alert and oriented, No acute distress. Cardiovascular: No clubbing, cyanosis, or  edema. Respiratory: Normal respiratory effort, no increased work of breathing. GI: Abdomen is soft, nontender, nondistended, no abdominal masses  Laboratory Data: Reviewed  Pertinent Imaging: I have personally viewed and interpreted the renal ultrasound dated 05/03/2021 showing a 2 cm solid mass in the mid right kidney, no hydronephrosis.  Assessment & Plan:   75 year old male with CKD and EGFR of around 69 who was recently found to have a 2 cm right midpole solid mass on renal ultrasound performed for evaluation of CKD.  No prior cross-sectional imaging to review.  We reviewed the need for further evaluation with better imaging with MRI.  We reviewed possibilities including complex cyst/hemorrhagic cyst, solid mass, and malignancy.  We reviewed treatment strategies ranging from active surveillance, partial nephrectomy, percutaneous ablation, and radical nephrectomy.  With his baseline CKD, active surveillance may be a very reasonable upfront strategy.  MRI abdomen for further evaluation of 2 cm right renal mass, follow-up to review results   [name], MD 05/23/2021  [facility] [address] [place], [place] [address] [phone]

## 2021-05-23 NOTE — Patient Instructions (Signed)
You have a small 2 cm abnormal spot on the right kidney.  We recommended MRI for further evaluation of this lesion and to figure out the next steps.  Luckily this is very small, and even if it is a tumor we can frequently safely watch these.

## 2021-06-02 ENCOUNTER — Other Ambulatory Visit: Payer: Self-pay | Admitting: Internal Medicine

## 2021-06-06 ENCOUNTER — Other Ambulatory Visit: Payer: Self-pay

## 2021-06-06 ENCOUNTER — Ambulatory Visit
Admission: RE | Admit: 2021-06-06 | Discharge: 2021-06-06 | Disposition: A | Discharge status: Home / Self Care | Payer: PPO | Source: Ambulatory Visit | Attending: Urology | Admitting: Urology

## 2021-06-06 DIAGNOSIS — K8070 Calculus of gallbladder and bile duct without cholecystitis without obstruction: Secondary | ICD-10-CM | POA: Diagnosis not present

## 2021-06-06 DIAGNOSIS — N2889 Other specified disorders of kidney and ureter: Secondary | ICD-10-CM | POA: Insufficient documentation

## 2021-06-06 DIAGNOSIS — K8050 Calculus of bile duct without cholangitis or cholecystitis without obstruction: Secondary | ICD-10-CM | POA: Diagnosis not present

## 2021-06-06 DIAGNOSIS — D3500 Benign neoplasm of unspecified adrenal gland: Secondary | ICD-10-CM | POA: Diagnosis not present

## 2021-06-06 DIAGNOSIS — N289 Disorder of kidney and ureter, unspecified: Secondary | ICD-10-CM | POA: Diagnosis not present

## 2021-06-06 IMAGING — MR MR ABDOMEN WO/W CM
16 of 17 series · 43 of 48 positions shown · IV contrast (gadavist)
Comparison: Renal sonogram [DATE].

CLINICAL DATA: History of suspected renal mass, also with renal
insufficiency by report.

EXAM:
MRI ABDOMEN WITHOUT AND WITH CONTRAST
TECHNIQUE: Multiplanar multisequence MR imaging of the abdomen was performed
both before and after the administration of intravenous contrast.
CONTRAST:  7.5mL GADAVIST GADOBUTROL 1 MMOL/ML IV SOLN

[Series 3: cor ssfse / · coronal · 7.0mm · 1.48mm/px · 1 of 34 slices shown]
[im 1/34]
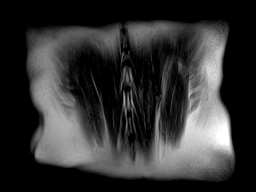

[Series 4: T2 fat-sat · axial · 6.0mm · 1.48mm/px · 1 of 35 slices shown]
[im 1/35]
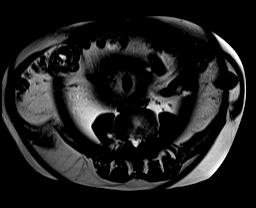

[Series 6: DWI · axial · 6.0mm · 2.00mm/px · z∈[-155,+97]mm · 5 of 108 slices shown (1 of 2)]
[im 1/108]
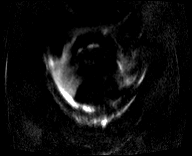
[im 27/108]
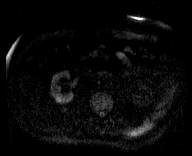
[im 54/108]
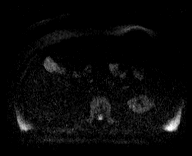
[im 81/108]
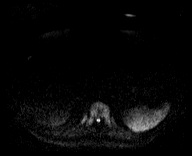
[im 108/108]
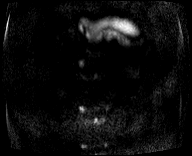

[Series 7: DWI · axial · 6.0mm · 2.00mm/px · z∈[-155,+97]mm · 2 of 36 slices shown (2 of 2)]
[im 1/36]
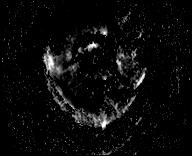
[im 36/36]
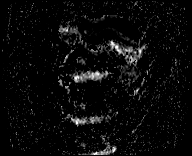

[Series 8: T1 · axial · 6.0mm · 0.74mm/px · z∈[-165,+80]mm · 3 of 70 slices shown]
[im 1/70]
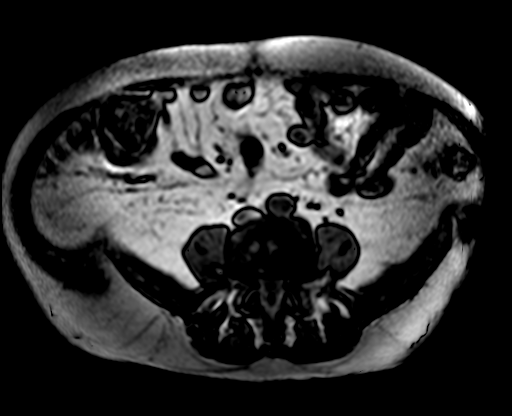
[im 35/70]
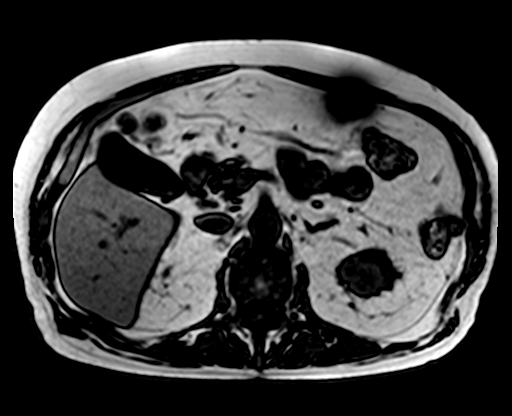
[im 70/70]
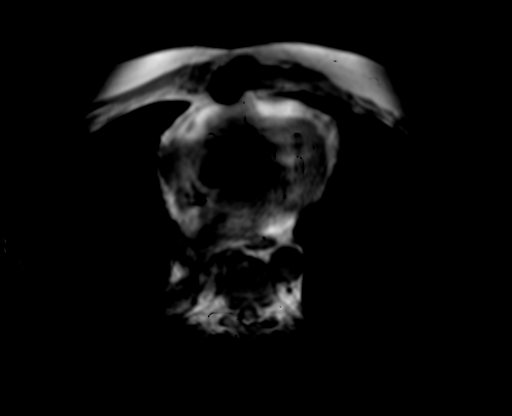

[Series 9: bSSFP · axial · 6.0mm · 0.74mm/px · z∈[-165,+80]mm · 2 of 35 slices shown]
[im 1/35]
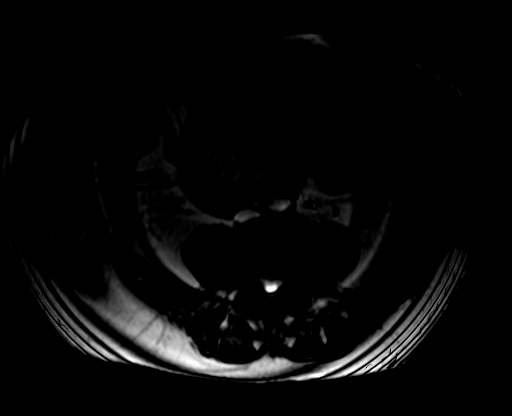
[im 35/35]
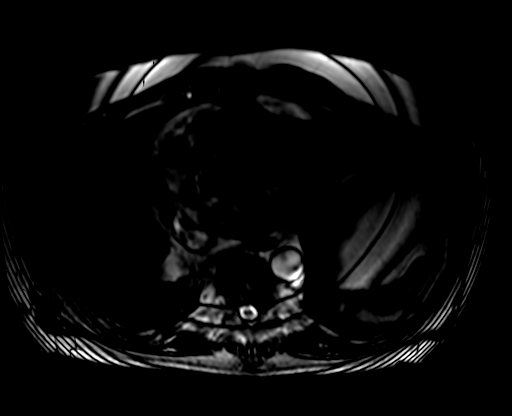

[Series 10: axial ssfse / · axial · 6.0mm · 1.19mm/px · z∈[-165,+80]mm · 2 of 35 slices shown]
[im 1/35]
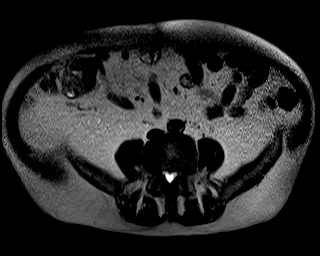
[im 35/35]
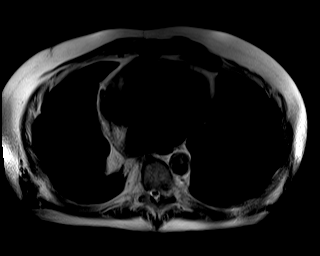

[Series 11: axial dynamic pre · axial · non-contrast · 4.0mm · 1.25mm/px · z∈[-169,+83]mm · 3 of 64 slices shown]
[im 1/64]
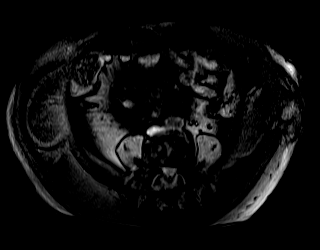
[im 32/64]
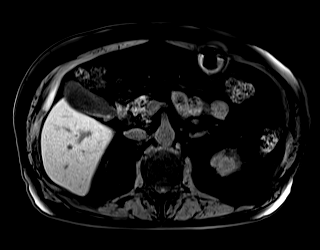
[im 64/64]
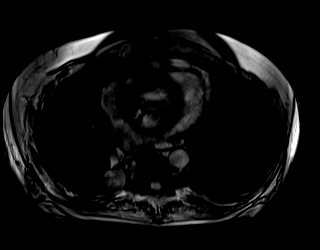

[Series 12: axial dynamic post · axial · 4.0mm · 1.25mm/px · z∈[-169,+83]mm · 3 of 64 slices shown (1 of 6)]
[im 1/64]
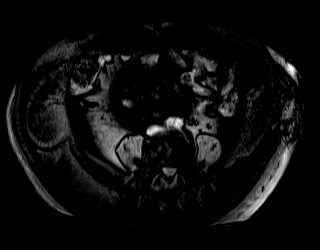
[im 32/64]
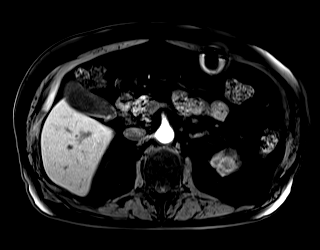
[im 64/64]
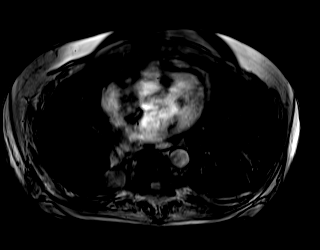

[Series 13: axial dynamic post · axial · 4.0mm · 1.25mm/px · z∈[-169,+83]mm · 3 of 64 slices shown (2 of 6)]
[im 1/64]
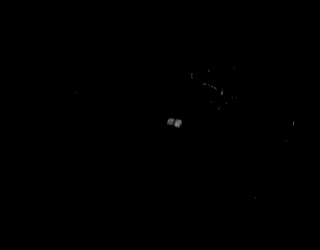
[im 32/64]
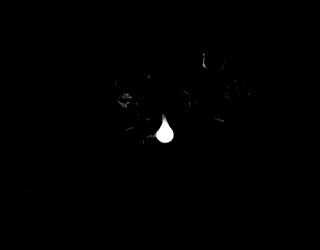
[im 64/64]
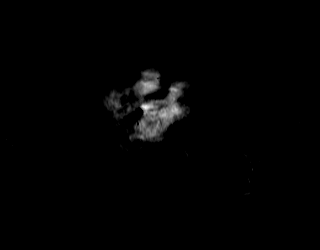

[Series 14: axial dynamic post · axial · 4.0mm · 1.25mm/px · z∈[-169,+83]mm · 3 of 64 slices shown (3 of 6)]
[im 1/64]
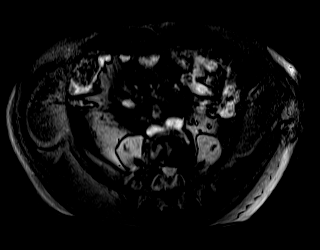
[im 32/64]
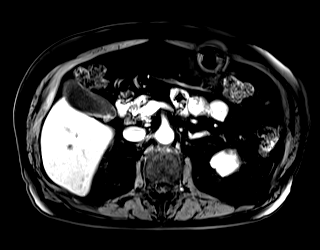
[im 64/64]
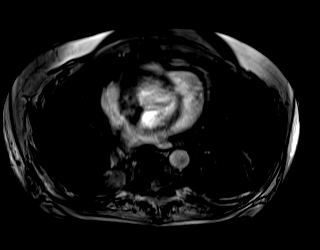

[Series 15: axial dynamic post · axial · 4.0mm · 1.25mm/px · z∈[-169,+83]mm · 3 of 64 slices shown (4 of 6)]
[im 1/64]
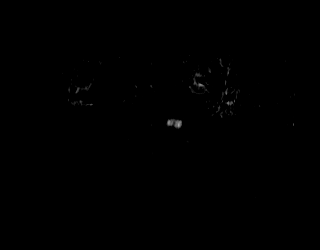
[im 32/64]
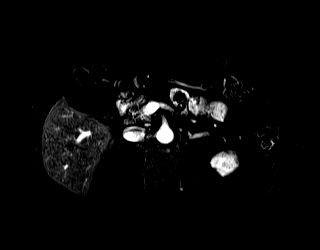
[im 64/64]
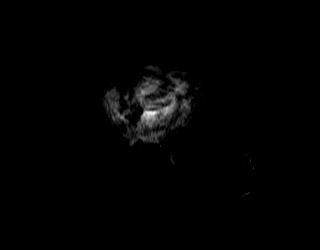

[Series 16: axial dynamic post · axial · 4.0mm · 1.25mm/px · z∈[-169,+83]mm · 3 of 64 slices shown (5 of 6)]
[im 1/64]
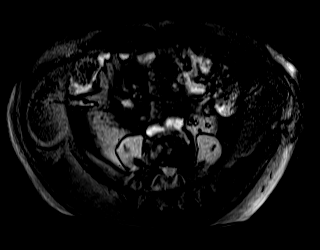
[im 32/64]
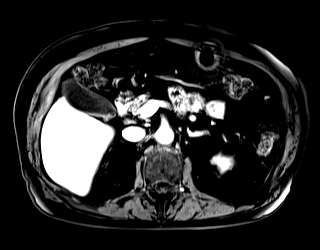
[im 64/64]
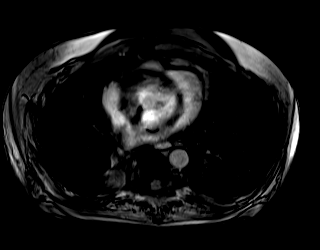

[Series 17: axial dynamic post · axial · 4.0mm · 1.25mm/px · z∈[-169,+83]mm · 3 of 64 slices shown (6 of 6)]
[im 1/64]
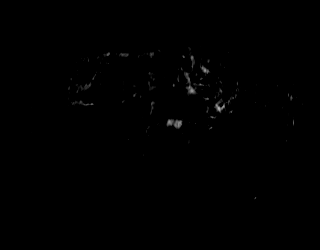
[im 32/64]
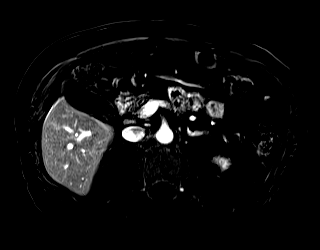
[im 64/64]
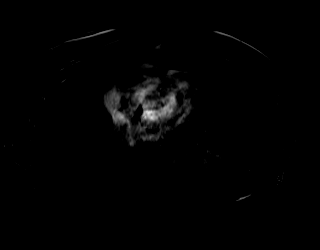

[Series 19: axial dynamic 3 · axial · 4.0mm · 1.25mm/px · z∈[-169,+83]mm · 3 of 64 slices shown (1 of 2)]
[im 1/64]
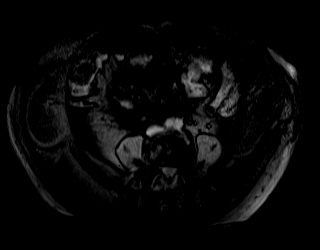
[im 32/64]
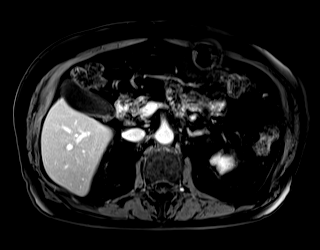
[im 64/64]
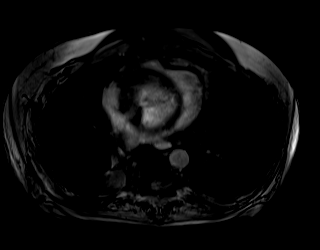

[Series 20: axial dynamic 3 · axial · 4.0mm · 1.25mm/px · z∈[-169,+83]mm · 3 of 64 slices shown (2 of 2)]
[im 1/64]
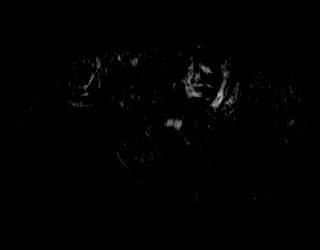
[im 32/64]
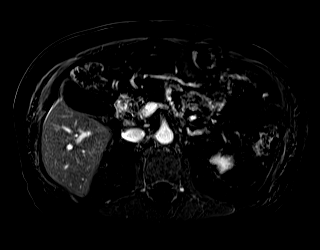
[im 64/64]
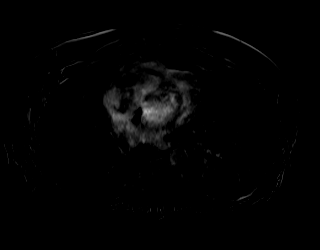

[43 of 48 positions shown; findings below may reference images not displayed]

FINDINGS: Lower chest: Chronic loculated RIGHT effusion grossly unchanged
compared to imaging dating back to [7S].

Hepatobiliary: Cholelithiasis. No substantial biliary duct
distension. Possible tiny biliary calculi (image [DATE]) distal common
bile duct. Caliber of the common bowel duct is between 4 and 5 mm in
there is no intrahepatic biliary duct distension. No pericholecystic
stranding.

No focal, suspicious hepatic lesion. Small hepatic hemangioma in the
RIGHT hepatic lobe (image [DATE]) 12 x 6 mm. Portal vein is patent.
Hepatic veins are patent.

Pancreas: Normal intrinsic T1 signal. No ductal dilation or sign of
inflammation. No focal lesion.

Spleen:  Spleen normal size and contour.

Adrenals/Urinary Tract:  RIGHT adrenal gland is normal.

LEFT adrenal gland with 1.7 x 1.2 cm LEFT adrenal adenoma. Renal
cortical scarring is noted bilaterally.

Heterogeneous enhancing lesion arises from the lateral cortex of the
RIGHT kidney (image 42/14) this is in the lateral interpolar/upper
pole RIGHT kidney measuring 1.6 by 1.5 cm. The lesion is mesophytic
and measures approximately 1.6-1.7 cm greatest craniocaudal
dimension.

No hydronephrosis or perinephric stranding. No additional suspicious
renal lesion. No retroperitoneal adenopathy or renal venous
involvement.

Stomach/Bowel: Unremarkable by MRI to the extent evaluated on
abdominal MRI.

Vascular/Lymphatic: No pathologically enlarged lymph nodes
identified. No abdominal aortic aneurysm demonstrated.

Other:  None

Musculoskeletal: No suspicious bone lesions identified.
IMPRESSION: Heterogeneous enhancing lesion arises from the lateral cortex of the
RIGHT kidney measuring 1.61.7 cm greatest dimension, compatible with
solid renal neoplasm most likely small renal cell carcinoma.

No evidence of metastatic disease in the abdomen.

Cholelithiasis.

Tiny biliary calculi in the distal common bile duct. Negative for
biliary duct distension or pericholecystic stranding. Correlate with
any symptoms of biliary colic.

Chronic loculated RIGHT effusion grossly unchanged compared to
imaging dating back to [7S].

LEFT adrenal adenoma.

These results will be called to the ordering clinician or
representative by the Radiologist Assistant, and communication
documented in the PACS or [REDACTED].

## 2021-06-06 MED ORDER — GADOBUTROL 1 MMOL/ML IV SOLN
7.5000 mL | Freq: Once | INTRAVENOUS | Status: AC | PRN
  Administered 2021-06-06: 7.5 mL via INTRAVENOUS

## 2021-06-13 ENCOUNTER — Other Ambulatory Visit: Payer: Self-pay

## 2021-06-13 ENCOUNTER — Ambulatory Visit: Payer: PPO | Admitting: Urology

## 2021-06-13 ENCOUNTER — Encounter: Payer: Self-pay | Admitting: Urology

## 2021-06-13 VITALS — BP 107/69 | HR 81 | Ht 69.0 in | Wt 171.0 lb

## 2021-06-13 DIAGNOSIS — N1832 Chronic kidney disease, stage 3b: Secondary | ICD-10-CM

## 2021-06-13 DIAGNOSIS — N2889 Other specified disorders of kidney and ureter: Secondary | ICD-10-CM | POA: Diagnosis not present

## 2021-06-13 NOTE — Progress Notes (Signed)
° °  06/13/2021 1:07 PM   Nathaniel Lane 1945-08-26 [id number]  Reason for visit: Follow up small right renal mass, CKD  HPI: 75 year old male with diabetes, CAD, history of thyroid cancer, as well as CKD stage IIIB with baseline renal function creatinine of 2, EGFR ~35 who was found to have a small solid right renal mass on renal ultrasound.  This prompted a MRI, and I personally viewed and interpreted the images dated 06/06/2021 that shows a small left adrenal adenoma, and a heterogenous enhancing lesion in the midpole of the right kidney measuring 1.6 cm in greatest dimension.  No evidence of metastatic disease.  We reviewed these results, and his new diagnosis of a small enhancing renal mass.  We discussed possible etiologies including complex cyst, or malignancy of varying types.  We reviewed the AUA guidelines and options including renal mass biopsy, active surveillance, percutaneous ablation, partial nephrectomy, and radical nephrectomy.    -With his comorbidities and especially his CKD stage IIIb, I recommended active surveillance with repeat MRI abdomen in 6 months.  If he has significant growth in his small renal mass at that time, could consider referral to interventional radiology for percutaneous biopsy and ablation.  -RTC 6 months with MRI abdomen prior  Billey Co, MD  [facility] [address] [place], [place] [address] [phone]

## 2021-06-13 NOTE — Patient Instructions (Signed)
Renal Mass  A renal mass is an abnormal growth in the kidney. It may be found while performing an MRI, CT scan, or ultrasound to evaluate other problems of the abdomen. A renal mass that is cancerous (malignant) may grow or spread quickly. Others are not cancerous (benign). Renal masses include: Tumors. These may be malignant or benign. The most common type of kidney cancer in adults is renal cell carcinoma. In children, the most common type of kidney cancer is Wilms tumor. The most common benign tumors of the kidney include renal adenomas, oncocytomas, and angiomyolipoma (AML). Cysts. These are fluid-filled sacs that form on or in the kidney. What are the causes? Certain types of cancers, infections, or injuries can cause a renal mass. It isnot always known what causes a cyst to develop in or on the kidney. What are the signs or symptoms? Often, a renal mass does not cause any signs or symptoms; most kidney cysts donot cause symptoms. How is this diagnosed? Your health care provider may recommend tests to diagnose the cause of your renal mass. These tests may be done if a renal mass is found: Physical exam. Blood tests. Urine tests. Imaging tests, such as ultrasound, CT scan, or MRI. Biopsy. This is a small sample that is removed from the renal mass and tested in a lab. The exact tests and how often they are done will depend on: The size and appearance of the renal mass. Risk factors or medical conditions that increase your risk for problems. Any symptoms associated with the renal mass, or concerns that you have about it. Tests and physical exams may be done once, or they may be done regularly for a period of time. Tests and exams that are done regularly will help monitorwhether the mass is growing and beginning to cause problems. How is this treated? Treatment is not always needed for this condition. Your health care provider may recommend careful monitoring and regular tests and exams.  Treatment willdepend on the cause of the mass. Treatment for a cancerous renal mass may include surgical removal,chemotherapy, radiation, or immunotherapy. Most kidney cysts do not need to be treated. Follow these instructions at home: What you need to do at home will depend on the cause of the mass. Follow the instructions that your health care provider gives to you. In general: Take over-the-counter and prescription medicines only as told by your health care provider. If you were prescribed an antibiotic medicine, take it as told by your health care provider. Do not stop taking the antibiotic even if you start to feel better. Follow any restrictions that are given to you by your health care provider. Keep all follow-up visits. This is important. You may need to see your health care provider once or twice a year to have CT scans and ultrasounds. These tests will show if your renal mass has changed or grown. Contact a health care provider if you: Have pain in your side or back (flank pain). Have a fever. Feel full soon after eating. Have pain or swelling in the abdomen. Lose weight. Get help right away if: Your pain gets worse. There is blood in your urine. You cannot urinate. You have chest pain. You have trouble breathing. These symptoms may represent a serious problem that is an emergency. Do not wait to see if the symptoms will go away. Get medical help right away. Call your local emergency services (911 in the [place] Summary A renal mass is an abnormal growth in the kidney.   It may be cancerous (malignant) and grow or spread quickly, or it may not be cancerous (benign). Renal masses often do not have any signs or symptoms. Renal masses may be found while performing an MRI, CT scan, or ultrasound for other problems of the abdomen. Your health care provider may recommend that you have tests to diagnose the cause of your renal mass. These may include a physical exam, blood tests, urine  tests, imaging, or a biopsy. Treatment is not always needed for this condition. Careful monitoring may be recommended. This information is not intended to replace advice given to you by your health care provider. Make sure you discuss any questions you have with your healthcare provider. Document Revised: 12/07/2019 Document Reviewed: 12/07/2019 Elsevier Patient Education  2022 Elsevier Inc.  

## 2021-06-24 ENCOUNTER — Other Ambulatory Visit: Payer: Self-pay | Admitting: Internal Medicine

## 2021-06-27 ENCOUNTER — Telehealth: Payer: Self-pay | Admitting: Internal Medicine

## 2021-06-27 MED ORDER — GABAPENTIN 300 MG PO CAPS: 300.0000 mg | ORAL_CAPSULE | Freq: Four times a day (QID) | ORAL | 2 refills | Status: DC

## 2021-06-27 NOTE — Telephone Encounter (Signed)
Patient Called in requesting a refill for gabapentin Neurontin 300 MG . Patient would like to take 4 a day instead of 3 a day. Patient is completely out of medication and been out since Friday. Please send refill to pharmacy [facility] in [place]. Please contact patient @ [phone]

## 2021-06-27 NOTE — Telephone Encounter (Signed)
Spoke with pt and let him know that the medication increase has been approved and Dr. Derrel Nip has sent in a new rx. Pt gave a verbal understanding.

## 2021-06-27 NOTE — Telephone Encounter (Signed)
Request for increase in Gabapentin dose to 4 times daily approved and new rx has been sent to [facility]

## 2021-07-16 ENCOUNTER — Other Ambulatory Visit: Payer: Self-pay | Admitting: Internal Medicine

## 2021-07-18 DIAGNOSIS — E1122 Type 2 diabetes mellitus with diabetic chronic kidney disease: Secondary | ICD-10-CM | POA: Diagnosis not present

## 2021-07-18 DIAGNOSIS — R809 Proteinuria, unspecified: Secondary | ICD-10-CM | POA: Diagnosis not present

## 2021-07-18 DIAGNOSIS — I10 Essential (primary) hypertension: Secondary | ICD-10-CM | POA: Diagnosis not present

## 2021-07-18 DIAGNOSIS — N1832 Chronic kidney disease, stage 3b: Secondary | ICD-10-CM | POA: Diagnosis not present

## 2021-07-18 DIAGNOSIS — R8290 Unspecified abnormal findings in urine: Secondary | ICD-10-CM | POA: Diagnosis not present

## 2021-07-27 DIAGNOSIS — R809 Proteinuria, unspecified: Secondary | ICD-10-CM | POA: Diagnosis not present

## 2021-07-27 DIAGNOSIS — N1832 Chronic kidney disease, stage 3b: Secondary | ICD-10-CM | POA: Diagnosis not present

## 2021-07-27 DIAGNOSIS — E1122 Type 2 diabetes mellitus with diabetic chronic kidney disease: Secondary | ICD-10-CM | POA: Diagnosis not present

## 2021-07-27 DIAGNOSIS — U071 COVID-19: Secondary | ICD-10-CM | POA: Diagnosis not present

## 2021-07-27 DIAGNOSIS — N2889 Other specified disorders of kidney and ureter: Secondary | ICD-10-CM | POA: Diagnosis not present

## 2021-07-27 DIAGNOSIS — I10 Essential (primary) hypertension: Secondary | ICD-10-CM | POA: Diagnosis not present

## 2021-07-27 DIAGNOSIS — R8290 Unspecified abnormal findings in urine: Secondary | ICD-10-CM | POA: Diagnosis not present

## 2021-07-27 DIAGNOSIS — D3502 Benign neoplasm of left adrenal gland: Secondary | ICD-10-CM | POA: Diagnosis not present

## 2021-08-03 ENCOUNTER — Ambulatory Visit (INDEPENDENT_AMBULATORY_CARE_PROVIDER_SITE_OTHER): Payer: PPO

## 2021-08-03 VITALS — BP 118/68 | Ht 69.0 in | Wt 171.0 lb

## 2021-08-03 DIAGNOSIS — Z0000 Encounter for general adult medical examination without abnormal findings: Secondary | ICD-10-CM | POA: Diagnosis not present

## 2021-08-03 NOTE — Patient Instructions (Addendum)
[name] , Thank you for taking time to come for your Medicare Wellness Visit. I appreciate your ongoing commitment to your health goals. Please review the following plan we discussed and let me know if I can assist you in the future.   These are the goals we discussed:  Goals      Increase physical activity     Stay active. Walk for exercise, moderate pace        This is a list of the screening recommended for you and due dates:  Health Maintenance  Topic Date Due   COVID-19 Vaccine (6 - Booster) 08/19/2021*   Flu Shot  09/22/2021*   Zoster (Shingles) Vaccine (1 of 2) 10/31/2021*   Hemoglobin A1C  08/30/2021   Complete foot exam   11/28/2021   Eye exam for diabetics  02/06/2022   Colon Cancer Screening  03/22/2024   Tetanus Vaccine  08/19/2029   Pneumonia Vaccine  Completed   Hepatitis C Screening: USPSTF Recommendation to screen - Ages 18-79 yo.  Completed   HPV Vaccine  Aged Out  *Topic was postponed. The date shown is not the original due date.   Advanced directives: End of life planning; Advance aging; Advanced directives discussed.  Copy of current HCPOA/Living Will requested.    Conditions/risks identified: none new  Follow up in one year for your annual wellness visit.   Preventive Care 44 Years and Older, Male Preventive care refers to lifestyle choices and visits with your health care provider that can promote health and wellness. What does preventive care include? A yearly physical exam. This is also called an annual well check. Dental exams once or twice a year. Routine eye exams. Ask your health care provider how often you should have your eyes checked. Personal lifestyle choices, including: Daily care of your teeth and gums. Regular physical activity. Eating a healthy diet. Avoiding tobacco and drug use. Limiting alcohol use. Practicing safe sex. Taking low doses of aspirin every day. Taking vitamin and mineral supplements as recommended by your health  care provider. What happens during an annual well check? The services and screenings done by your health care provider during your annual well check will depend on your age, overall health, lifestyle risk factors, and family history of disease. Counseling  Your health care provider may ask you questions about your: Alcohol use. Tobacco use. Drug use. Emotional well-being. Home and relationship well-being. Sexual activity. Eating habits. History of falls. Memory and ability to understand (cognition). Work and work Statistician. Screening  You may have the following tests or measurements: Height, weight, and BMI. Blood pressure. Lipid and cholesterol levels. These may be checked every 5 years, or more frequently if you are over 51 years old. Skin check. Lung cancer screening. You may have this screening every year starting at age 68 if you have a 30-pack-year history of smoking and currently smoke or have quit within the past 15 years. Fecal occult blood test (FOBT) of the stool. You may have this test every year starting at age 45. Flexible sigmoidoscopy or colonoscopy. You may have a sigmoidoscopy every 5 years or a colonoscopy every 10 years starting at age 15. Prostate cancer screening. Recommendations will vary depending on your family history and other risks. Hepatitis C blood test. Hepatitis B blood test. Sexually transmitted disease (STD) testing. Diabetes screening. This is done by checking your blood sugar (glucose) after you have not eaten for a while (fasting). You may have this done every 1-3 years. Abdominal  aortic aneurysm (AAA) screening. You may need this if you are a current or former smoker. Osteoporosis. You may be screened starting at age 76 if you are at high risk. Talk with your health care provider about your test results, treatment options, and if necessary, the need for more tests. Vaccines  Your health care provider may recommend certain vaccines, such  as: Influenza vaccine. This is recommended every year. Tetanus, diphtheria, and acellular pertussis (Tdap, Td) vaccine. You may need a Td booster every 10 years. Zoster vaccine. You may need this after age 24. Pneumococcal 13-valent conjugate (PCV13) vaccine. One dose is recommended after age 34. Pneumococcal polysaccharide (PPSV23) vaccine. One dose is recommended after age 21. Talk to your health care provider about which screenings and vaccines you need and how often you need them. This information is not intended to replace advice given to you by your health care provider. Make sure you discuss any questions you have with your health care provider. Document Released: 07/08/2015 Document Revised: 02/29/2016 Document Reviewed: 04/12/2015 Elsevier Interactive Patient Education  2017 [employer] Prevention in the Home Falls can cause injuries. They can happen to people of all ages. There are many things you can do to make your home safe and to help prevent falls. What can I do on the outside of my home? Regularly fix the edges of walkways and driveways and fix any cracks. Remove anything that might make you trip as you walk through a door, such as a raised step or threshold. Trim any bushes or trees on the path to your home. Use bright outdoor lighting. Clear any walking paths of anything that might make someone trip, such as rocks or tools. Regularly check to see if handrails are loose or broken. Make sure that both sides of any steps have handrails. Any raised decks and porches should have guardrails on the edges. Have any leaves, snow, or ice cleared regularly. Use sand or salt on walking paths during winter. Clean up any spills in your garage right away. This includes oil or grease spills. What can I do in the bathroom? Use night lights. Install grab bars by the toilet and in the tub and shower. Do not use towel bars as grab bars. Use non-skid mats or decals in the tub or  shower. If you need to sit down in the shower, use a plastic, non-slip stool. Keep the floor dry. Clean up any water that spills on the floor as soon as it happens. Remove soap buildup in the tub or shower regularly. Attach bath mats securely with double-sided non-slip rug tape. Do not have throw rugs and other things on the floor that can make you trip. What can I do in the bedroom? Use night lights. Make sure that you have a light by your bed that is easy to reach. Do not use any sheets or blankets that are too big for your bed. They should not hang down onto the floor. Have a firm chair that has side arms. You can use this for support while you get dressed. Do not have throw rugs and other things on the floor that can make you trip. What can I do in the kitchen? Clean up any spills right away. Avoid walking on wet floors. Keep items that you use a lot in easy-to-reach places. If you need to reach something above you, use a strong step stool that has a grab bar. Keep electrical cords out of the way. Do not use  floor polish or wax that makes floors slippery. If you must use wax, use non-skid floor wax. Do not have throw rugs and other things on the floor that can make you trip. What can I do with my stairs? Do not leave any items on the stairs. Make sure that there are handrails on both sides of the stairs and use them. Fix handrails that are broken or loose. Make sure that handrails are as long as the stairways. Check any carpeting to make sure that it is firmly attached to the stairs. Fix any carpet that is loose or worn. Avoid having throw rugs at the top or bottom of the stairs. If you do have throw rugs, attach them to the floor with carpet tape. Make sure that you have a light switch at the top of the stairs and the bottom of the stairs. If you do not have them, ask someone to add them for you. What else can I do to help prevent falls? Wear shoes that: Do not have high heels. Have  rubber bottoms. Are comfortable and fit you well. Are closed at the toe. Do not wear sandals. If you use a stepladder: Make sure that it is fully opened. Do not climb a closed stepladder. Make sure that both sides of the stepladder are locked into place. Ask someone to hold it for you, if possible. Clearly mark and make sure that you can see: Any grab bars or handrails. First and last steps. Where the edge of each step is. Use tools that help you move around (mobility aids) if they are needed. These include: Canes. Walkers. Scooters. Crutches. Turn on the lights when you go into a dark area. Replace any light bulbs as soon as they burn out. Set up your furniture so you have a clear path. Avoid moving your furniture around. If any of your floors are uneven, fix them. If there are any pets around you, be aware of where they are. Review your medicines with your doctor. Some medicines can make you feel dizzy. This can increase your chance of falling. Ask your doctor what other things that you can do to help prevent falls. This information is not intended to replace advice given to you by your health care provider. Make sure you discuss any questions you have with your health care provider. Document Released: 04/07/2009 Document Revised: 11/17/2015 Document Reviewed: 07/16/2014 Elsevier Interactive Patient Education  2017 [employer].

## 2021-08-03 NOTE — Progress Notes (Addendum)
Subjective:   Nathaniel Lane is a 76 y.o. male who presents for Medicare Annual/Subsequent preventive examination.  Review of Systems    No ROS.  Medicare Wellness Virtual Visit.  Visual/audio telehealth visit, UTA vital signs.   See social history for additional risk factors.   Cardiac Risk Factors include: advanced age (>81men, >38 women)     Objective:    Today's Vitals   08/03/21 0903  BP: 118/68  Weight: 171 lb (77.6 kg)  Height: $Remove'5\' 9"'JrUZucQ$  (1.753 m)   Body mass index is 25.25 kg/m.  Advanced Directives 08/03/2021 03/22/2021 08/02/2020 07/31/2019 06/26/2019 07/29/2018 01/28/2018  Does Patient Have a Medical Advance Directive? Yes No Yes Yes Yes Yes Yes  Type of [employer] [place];Living will Healthcare Power of [place] will Healthcare Power of [place]  Does patient want to make changes to medical advance directive? No - Patient declined - No - Patient declined No - Patient declined - No - Patient declined -  Copy of [place] in Chart? No - copy requested - No - copy requested No - copy requested - No - copy requested -  Would patient like information on creating a medical advance directive? - - - - No - Patient declined - -    Current Medications (verified) Outpatient Encounter Medications as of 08/03/2021  Medication Sig   atorvastatin (LIPITOR) 40 MG tablet TAKE 1 TABLET BY MOUTH EVERY DAY   tamsulosin (FLOMAX) 0.4 MG CAPS capsule TAKE 1 CAPSULE BY MOUTH EVERY DAY   aspirin (ASPIRIN LOW DOSE) 81 MG EC tablet TAKE 1 TABLET (81 MG TOTAL) BY MOUTH DAILY. SWALLOW WHOLE.   blood glucose meter kit and supplies KIT Dispense based on patient and insurance preference. Use up to four times daily as directed. (FOR ICD-10 E11.29).   gabapentin (NEURONTIN) 300 MG capsule Take 1 capsule (300 mg total) by mouth 4 (four) times daily.   INVOKAMET 50-500 MG TABS TAKE 1 TABLET BY MOUTH TWICE  A DAY   levothyroxine (SYNTHROID, LEVOTHROID) 150 MCG tablet TAKE ONE TABLET BY MOUTH ONCE DAILY BEFORE BREAKFAST   losartan (COZAAR) 50 MG tablet TAKE 1 TABLET BY MOUTH EVERY DAY   omeprazole (PRILOSEC) 20 MG capsule Take 20 mg by mouth daily.   ONETOUCH DELICA LANCETS FINE MISC USE AS DIRECTED 4 TIMES A DAY   ONETOUCH ULTRA test strip USE AS DIRECTED 4 TIMES A DAY   No facility-administered encounter medications on file as of 08/03/2021.    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   s/p thyroidectomy 2014   Past Surgical History:  Procedure Laterality Date   COLONOSCOPY N/A 04/25/2016   Procedure: COLONOSCOPY;  Surgeon: Manya Silvas, MD;  Location: [facility];  Service: Endoscopy;  Laterality: N/A;  Diabetic (oral meds)   COLONOSCOPY WITH PROPOFOL N/A 03/22/2021   Procedure: COLONOSCOPY WITH PROPOFOL;  Surgeon: Lin Landsman, MD;  Location[facility] ENDOSCOPY;  Service: Gastroenterology;  Laterality: N/A;   CORONARY ARTERY BYPASS GRAFT  2007   LUNG LOBECTOMY     TOTAL THYROIDECTOMY N/A    Family History  Problem Relation Age of Onset   Hypertension  Other    Heart disease Father    Social History   Socioeconomic History   Marital status: Widowed    Spouse name: Not on file   Number of children: Not on file   Years of education: Not on file   Highest education level: Not on file  Occupational History   Occupation: Parts Delivery    Employer: retired  Tobacco Use   Smoking status: Former    Types: Cigarettes    Quit date: 07/15/2005    Years since quitting: 16.0   Smokeless tobacco: Never  Vaping Use   Vaping Use: Never used  Substance and Sexual Activity   Alcohol use: No   Drug use: No   Sexual activity: Not on file  Other Topics  Concern   Not on file  Social History Narrative   Lives alone; wife passed away 13-Jun-2019   Social Determinants of Health   Financial Resource Strain: Low Risk    Difficulty of Paying Living Expenses: Not hard at all  Food Insecurity: No Food Insecurity   Worried About Charity fundraiser in the Last Year: Never true   [place] in the Last Year: Never true  Transportation Needs: No Transportation Needs   Lack of Transportation (Medical): No   Lack of Transportation (Non-Medical): No  Physical Activity: Insufficiently Active   Days of Exercise per Week: 4 days   Minutes of Exercise per Session: 30 min  Stress: No Stress Concern Present   Feeling of Stress : Not at all  Social Connections: Unknown   Frequency of Communication with Friends and Family: More than three times a week   Frequency of Social Gatherings with Friends and Family: More than three times a week   Attends Religious Services: Not on file   Active Member of Clubs or Organizations: Yes   Attends Archivist Meetings: More than 4 times per year   Marital Status: Widowed    Tobacco Counseling Counseling given: Not Answered   Clinical Intake:  Pre-visit preparation completed: Yes        Diabetes: Yes (Followed by PCP)  Nutrition Risk Assessment: Does the patient have any non-healing wounds?  No    Diabetes: How often do you monitor your CBG's? daily.   Financial Strains and Diabetes Management: Are you having any financial strains with the device, your supplies or your medication? No .  Does the patient want to be seen by Chronic Care Management for management of their diabetes?  No  Would the patient like to be referred to a Nutritionist or for Diabetic Management?  No     Interpreter Needed?: No      Activities of Daily Living In your present state of health, do you have any difficulty performing the following activities: 08/03/2021  Hearing? N  Vision? N  Difficulty  concentrating or making decisions? N  Walking or climbing stairs? N  Dressing or bathing? N  Doing errands, shopping? N  Preparing Food and eating ? Y  Comment He does not cook. Dines out. Self feeds.  Using the Toilet? N  In the past six months, have you accidently leaked urine? N  Do you have problems with loss of bowel control? N  Managing your Medications? N  Managing your Finances? N  Housekeeping or managing your Housekeeping? Y  Comment Maid assist  Some recent data might be hidden    Patient Care Team: Crecencio Mc, MD as PCP - General (Internal Medicine)  Indicate any recent Medical Services you may have received from other than [facility] providers in the past year (date may be approximate).     Assessment:   This is a routine wellness examination for [place].  Virtual Visit via Telephone Note  I connected with  [name] on 08/03/21 at  9:00 AM EST by telephone and verified that I am speaking with the correct person using two identifiers.  Persons participating in the virtual visit: patient/Nurse Health Advisor   I discussed the limitations, risks, security and privacy concerns of performing an evaluation and management service by telephone and the availability of in person appointments. The patient expressed understanding and agreed to proceed.  Interactive audio and video telecommunications were attempted between this nurse and patient, however failed, due to patient having technical difficulties OR patient did not have access to video capability.  We continued and completed visit with audio only.  Some vital signs may be absent or patient reported.   Hearing/Vision screen Hearing Screening - Comments:: Patient is able to hear conversational tones without difficulty. No issues reported. Vision Screening - Comments:: Followed by [facility]  Wears corrective lenses  They have regular follow up with the ophthalmologist  Dietary issues and exercise  activities discussed: Current Exercise Habits: Home exercise routine, Type of exercise: walking, Frequency (Times/Week): 4, Intensity: Mild Regular diet Good water intake   Goals Addressed             This Visit's Progress    Increase physical activity       Stay active. Walk for exercise, moderate pace       Depression Screen PHQ 2/9 Scores 08/03/2021 03/02/2021 11/28/2020 08/02/2020 07/31/2019 07/29/2018 02/20/2017  PHQ - 2 Score 0 0 1 0 0 0 0  PHQ- 9 Score - - 5 - - - 0    Fall Risk Fall Risk  08/03/2021 03/02/2021 11/28/2020 08/02/2020 02/19/2020  Falls in the past year? 0 0 0 0 1  Number falls in past yr: 0 - - 0 -  Injury with Fall? - - - 0 -  Follow up Falls evaluation completed Falls evaluation completed Falls evaluation completed Falls evaluation completed Falls evaluation completed   [place]: Home free of loose throw rugs in walkways, pet beds, electrical cords, etc? No  Adequate lighting in your home to reduce risk of falls? No   ASSISTIVE DEVICES UTILIZED TO PREVENT FALLS: Life alert? No  Use of a cane, walker or w/c? No   TIMED UP AND GO: Was the test performed? No .   Cognitive Function: MMSE - Mini Mental State Exam 02/20/2017 11/01/2014  Orientation to time 5 5  Orientation to Place 5 5  Registration 3 3  Attention/ Calculation 5 5  Recall 3 3  Language- name 2 objects 2 2  Language- repeat 1 1  Language- follow 3 step command 3 3  Language- read & follow direction 1 1  Write a sentence 1 1  Copy design 1 1  Total score 30 30     6CIT Screen 08/03/2021 08/02/2020 07/29/2018  What Year? 0 points 0 points 0 points  What month? 0 points 0 points 0 points  What time? 0 points 0 points 0 points  Count back from 20 0 points 0 points 0 points  Months in reverse 0 points 0 points 0 points  Repeat phrase 0 points - 0 points  Total Score 0 - 0  Immunizations Immunization History  Administered Date(s) Administered   Influenza Split  04/15/2013   PFIZER Comirnaty(Gray Top)Covid-19 Tri-Sucrose Vaccine 12/30/2019, 08/21/2020   PFIZER(Purple Top)SARS-COV-2 Vaccination 01/09/2020, 01/30/2020, 08/21/2020   Pneumococcal Conjugate-13 07/15/2013   Pneumococcal Polysaccharide-23 01/15/2012, 05/06/2017   Tdap 03/06/2007, 08/20/2019   Shingrix Completed?: No.    Education has been provided regarding the importance of this vaccine. Patient has been advised to call insurance company to determine out of pocket expense if they have not yet received this vaccine. Advised may also receive vaccine at local pharmacy or Health Dept. Verbalized acceptance and understanding.  Screening Tests Health Maintenance  Topic Date Due   COVID-19 Vaccine (6 - Booster) 08/19/2021 (Originally 10/16/2020)   INFLUENZA VACCINE  09/22/2021 (Originally 01/23/2021)   Zoster Vaccines- Shingrix (1 of 2) 10/31/2021 (Originally [birth year]/1966)   HEMOGLOBIN A1C  08/30/2021   FOOT EXAM  11/28/2021   OPHTHALMOLOGY EXAM  02/06/2022   COLONOSCOPY (Pts 45-40yrs Insurance coverage will need to be confirmed)  03/22/2024   TETANUS/TDAP  08/19/2029   Pneumonia Vaccine 30+ Years old  Completed   Hepatitis C Screening  Completed   HPV VACCINES  Aged Out   Health Maintenance There are no preventive care reminders to display for this patient.  Hepatitis C Screening: does not qualify  Vision Screening: Recommended annual ophthalmology exams for early detection of glaucoma and other disorders of the eye.  Dental Screening: Recommended annual dental exams for proper oral hygiene  Community Resource Referral / Chronic Care Management: CRR required this visit?  No   CCM required this visit?  No      Plan:   Keep all routine maintenance appointments.   I have personally reviewed and noted the following in the patients chart:   Medical and social history Use of alcohol, tobacco or illicit drugs  Current medications and supplements including opioid prescriptions. Patient  is not currently taking opioid prescriptions. Functional ability and status Nutritional status Physical activity Advanced directives List of other physicians Hospitalizations, surgeries, and ER visits in previous 12 months Vitals Screenings to include cognitive, depression, and falls Referrals and appointments  In addition, I have reviewed and discussed with patient certain preventive protocols, quality metrics, and best practice recommendations. A written personalized care plan for preventive services as well as general preventive health recommendations were provided to patient.     OBrien-Blaney, Jesstin Studstill L, LPN   06/25/2160    I have reviewed the above information and agree with above.   Deborra Medina, MD

## 2021-08-11 ENCOUNTER — Other Ambulatory Visit: Payer: Self-pay | Admitting: Internal Medicine

## 2021-08-11 DIAGNOSIS — Z85850 Personal history of malignant neoplasm of thyroid: Secondary | ICD-10-CM | POA: Diagnosis not present

## 2021-08-11 DIAGNOSIS — E890 Postprocedural hypothyroidism: Secondary | ICD-10-CM | POA: Diagnosis not present

## 2021-08-18 DIAGNOSIS — Z85850 Personal history of malignant neoplasm of thyroid: Secondary | ICD-10-CM | POA: Diagnosis not present

## 2021-08-18 DIAGNOSIS — E890 Postprocedural hypothyroidism: Secondary | ICD-10-CM | POA: Diagnosis not present

## 2021-08-31 ENCOUNTER — Ambulatory Visit: Payer: PPO | Admitting: Internal Medicine

## 2021-09-14 ENCOUNTER — Other Ambulatory Visit: Payer: Self-pay | Admitting: Internal Medicine

## 2021-09-21 ENCOUNTER — Encounter: Payer: Self-pay | Admitting: Internal Medicine

## 2021-09-21 ENCOUNTER — Ambulatory Visit (INDEPENDENT_AMBULATORY_CARE_PROVIDER_SITE_OTHER): Payer: PPO | Admitting: Internal Medicine

## 2021-09-21 VITALS — BP 120/60 | HR 67 | Temp 97.6°F | Ht 69.0 in | Wt 165.6 lb

## 2021-09-21 DIAGNOSIS — N183 Chronic kidney disease, stage 3 unspecified: Secondary | ICD-10-CM | POA: Diagnosis not present

## 2021-09-21 DIAGNOSIS — E1121 Type 2 diabetes mellitus with diabetic nephropathy: Secondary | ICD-10-CM

## 2021-09-21 DIAGNOSIS — I700 Atherosclerosis of aorta: Secondary | ICD-10-CM | POA: Diagnosis not present

## 2021-09-21 DIAGNOSIS — E1169 Type 2 diabetes mellitus with other specified complication: Secondary | ICD-10-CM | POA: Diagnosis not present

## 2021-09-21 DIAGNOSIS — E890 Postprocedural hypothyroidism: Secondary | ICD-10-CM | POA: Diagnosis not present

## 2021-09-21 DIAGNOSIS — I255 Ischemic cardiomyopathy: Secondary | ICD-10-CM | POA: Diagnosis not present

## 2021-09-21 DIAGNOSIS — I10 Essential (primary) hypertension: Secondary | ICD-10-CM

## 2021-09-21 DIAGNOSIS — E1122 Type 2 diabetes mellitus with diabetic chronic kidney disease: Secondary | ICD-10-CM | POA: Diagnosis not present

## 2021-09-21 DIAGNOSIS — E785 Hyperlipidemia, unspecified: Secondary | ICD-10-CM

## 2021-09-21 DIAGNOSIS — J449 Chronic obstructive pulmonary disease, unspecified: Secondary | ICD-10-CM | POA: Diagnosis not present

## 2021-09-21 DIAGNOSIS — I25810 Atherosclerosis of coronary artery bypass graft(s) without angina pectoris: Secondary | ICD-10-CM

## 2021-09-21 LAB — COMPREHENSIVE METABOLIC PANEL
ALT: 25 U/L (ref 0–53)
AST: 17 U/L (ref 0–37)
Albumin: 4.2 g/dL (ref 3.5–5.2)
Alkaline Phosphatase: 122 U/L — ABNORMAL HIGH (ref 39–117)
BUN: 38 mg/dL — ABNORMAL HIGH (ref 6–23)
CO2: 26 mEq/L (ref 19–32)
Calcium: 9.5 mg/dL (ref 8.4–10.5)
Chloride: 103 mEq/L (ref 96–112)
Creatinine, Ser: 1.97 mg/dL — ABNORMAL HIGH (ref 0.40–1.50)
GFR: 33.03 mL/min — ABNORMAL LOW (ref >60.00)
Glucose, Bld: 151 mg/dL — ABNORMAL HIGH (ref 70–99)
Potassium: 4.9 mEq/L (ref 3.5–5.1)
Sodium: 139 mEq/L (ref 135–145)
Total Bilirubin: 0.4 mg/dL (ref 0.2–1.2)
Total Protein: 6.8 g/dL (ref 6.0–8.3)

## 2021-09-21 LAB — CBC WITH DIFFERENTIAL/PLATELET
Basophils Absolute: 0 10*3/uL (ref 0.0–0.1)
Basophils Relative: 0.3 % (ref 0.0–3.0)
Eosinophils Absolute: 0.2 10*3/uL (ref 0.0–0.7)
Eosinophils Relative: 2.9 % (ref 0.0–5.0)
HCT: 45.5 % (ref 39.0–52.0)
Hemoglobin: 15.1 g/dL (ref 13.0–17.0)
Lymphocytes Relative: 21.5 % (ref 12.0–46.0)
Lymphs Abs: 1.8 10*3/uL (ref 0.7–4.0)
MCHC: 33.2 g/dL (ref 30.0–36.0)
MCV: 88.4 fl (ref 78.0–100.0)
Monocytes Absolute: 0.7 10*3/uL (ref 0.1–1.0)
Monocytes Relative: 8.6 % (ref 3.0–12.0)
Neutro Abs: 5.7 10*3/uL (ref 1.4–7.7)
Neutrophils Relative %: 66.7 % (ref 43.0–77.0)
Platelets: 149 10*3/uL — ABNORMAL LOW (ref 150.0–400.0)
RBC: 5.15 Mil/uL (ref 4.22–5.81)
RDW: 14.7 % (ref 11.5–15.5)
WBC: 8.5 10*3/uL (ref 4.0–10.5)

## 2021-09-21 LAB — LIPID PANEL
Cholesterol: 142 mg/dL (ref 0–200)
HDL: 34.2 mg/dL — ABNORMAL LOW (ref >39.00)
LDL Cholesterol: 68 mg/dL (ref 0–99)
NonHDL: 107.39
Total CHOL/HDL Ratio: 4
Triglycerides: 195 mg/dL — ABNORMAL HIGH (ref 0.0–149.0)
VLDL: 39 mg/dL (ref 0.0–40.0)

## 2021-09-21 LAB — HEMOGLOBIN A1C: Hgb A1c MFr Bld: 7.8 % — ABNORMAL HIGH (ref 4.6–6.5)

## 2021-09-21 LAB — TSH: TSH: 0.11 u[IU]/mL — ABNORMAL LOW (ref 0.35–5.50)

## 2021-09-21 MED ORDER — ZOSTER VAC RECOMB ADJUVANTED 50 MCG/0.5ML IM SUSR: 0.5000 mL | Freq: Once | INTRAMUSCULAR | 1 refills | Status: AC

## 2021-09-21 MED ORDER — DAPAGLIFLOZIN PROPANEDIOL 10 MG PO TABS: 10.0000 mg | ORAL_TABLET | Freq: Every day | ORAL | 5 refills | Status: DC

## 2021-09-21 MED ORDER — METFORMIN HCL ER 750 MG PO TB24: 750.0000 mg | ORAL_TABLET | Freq: Every day | ORAL | 5 refills | Status: DC

## 2021-09-21 NOTE — Progress Notes (Signed)
? ?Subjective:  ?[name], male    DOB: 15-Feb-1946  Age: 76 y.o. MRN: [id number] ? ?CC: The primary encounter diagnosis was Elevated blood pressure reading in office with white coat syndrome, with diagnosis of hypertension. Diagnoses of Coronary artery disease involving autologous vein coronary bypass graft without angina pectoris, Hyperlipidemia associated with type 2 diabetes mellitus ([place]), Controlled type 2 diabetes mellitus with diabetic nephropathy, without long-term current use of insulin (HCC), COPD, mild ([place]), Ischemic cardiomyopathy, Aortic atherosclerosis ([place]), Postsurgical hypothyroidism, and CKD stage 3 due to type 2 diabetes mellitus ([place]) were also pertinent to this visit. ? ? ?This visit occurred during the SARS-CoV-2 public health emergency.  Safety protocols were in place, including screening questions prior to the visit, additional usage of staff PPE, and extensive cleaning of exam room while observing appropriate contact time as indicated for disinfecting solutions.   ? ?HPI ?Nathaniel Lane presents for  ?Chief Complaint  ?Patient presents with  ? Follow-up  ?  6 mo f/u for DM  ? ? ?1)   T2DM:He feels generally well, is walking  several times per week and checking blood sugars once daily at variable times.  Hoe BS readings reviewed today:  his Fasting BS have been > 150  and < 200  95% of the time .  Denies any recent hypoglyemic events.  Taking his InvokaMet  50/500  twice dai;  the monthly cost is $105/month.   Following a carbohydrate modified diet 6 days per week. Denies numbness, burning and tingling of extremities. Appetite is good. Not exercising .  Diet reviewed in detail  ?He eats at diners or steak house  ,  rarely fast food .  Breakfast is at a cafe:  2 eggs, bacon, tomato,  1/2 toast.   Lunch :  looks for diners so he can get several servings of vegetables .  No dessert ,  only a small biscuit.   Dinner: same as lunch ? ?2) Aortic atherosclerosis:  Reviewed findings  of prior CT scan today..  Patient is tolerating high potency statin therapy   ?   ? ?3) HTN:  Patient is taking his medications as prescribed(losartan 50 mg  )  and notes no adverse effects.  Home BP readings have been done about once per week and are  generally < 130/80 .  he is avoiding added salt in his  diet  but is not walking regularly  for exercise  .  ? ?Outpatient Medications Prior to Visit  ?Medication Sig Dispense Refill  ? aspirin (ASPIRIN LOW DOSE) 81 MG EC tablet TAKE 1 TABLET (81 MG TOTAL) BY MOUTH DAILY. SWALLOW WHOLE. 90 tablet 3  ? atorvastatin (LIPITOR) 40 MG tablet TAKE 1 TABLET BY MOUTH EVERY DAY 90 tablet 1  ? blood glucose meter kit and supplies KIT Dispense based on patient and insurance preference. Use up to four times daily as directed. (FOR ICD-10 E11.29). 1 each 0  ? gabapentin (NEURONTIN) 300 MG capsule Take 1 capsule (300 mg total) by mouth 4 (four) times daily. 360 capsule 2  ? levothyroxine (SYNTHROID, LEVOTHROID) 150 MCG tablet TAKE ONE TABLET BY MOUTH ONCE DAILY BEFORE BREAKFAST 90 tablet 1  ? losartan (COZAAR) 50 MG tablet TAKE 1 TABLET BY MOUTH EVERY DAY 90 tablet 3  ? omeprazole (PRILOSEC) 20 MG capsule Take 20 mg by mouth daily.    ? ONETOUCH DELICA LANCETS FINE MISC USE AS DIRECTED 4 TIMES A DAY 100 each 2  ?  ONETOUCH ULTRA test strip USE AS DIRECTED 4 TIMES A DAY 100 strip 2  ? tamsulosin (FLOMAX) 0.4 MG CAPS capsule TAKE 1 CAPSULE BY MOUTH EVERY DAY 90 capsule 1  ? INVOKAMET 50-500 MG TABS TAKE 1 TABLET BY MOUTH TWICE A DAY 60 tablet 2  ? ?No facility-administered medications prior to visit.  ? ? ?Review of Systems; ? ?Patient denies headache, fevers, malaise, unintentional weight loss, skin rash, eye pain, sinus congestion and sinus pain, sore throat, dysphagia,  hemoptysis , cough, dyspnea, wheezing, chest pain, palpitations, orthopnea, edema, abdominal pain, nausea, melena, diarrhea, constipation, flank pain, dysuria, hematuria, urinary  Frequency, nocturia, numbness,  tingling, seizures,  Focal weakness, Loss of consciousness,  Tremor, insomnia, depression, anxiety, and suicidal ideation.   ? ? ? ?Objective:  ?BP 120/60 (BP Location: Left Arm, Patient Position: Sitting, Cuff Size: Small)   Pulse 67   Temp 97.6 ?F (36.4 ?C) (Oral)   Ht $R'5\' 9"'vC$  (1.753 m)   Wt 165 lb 9.6 oz (75.1 kg)   SpO2 99%   BMI 24.45 kg/m?  ? ?BP Readings from Last 3 Encounters:  ?09/21/21 120/60  ?08/03/21 118/68  ?06/13/21 107/69  ? ? ?Wt Readings from Last 3 Encounters:  ?09/21/21 165 lb 9.6 oz (75.1 kg)  ?08/03/21 171 lb (77.6 kg)  ?06/13/21 171 lb (77.6 kg)  ? ? ?General appearance: alert, cooperative and appears stated age ?Ears: normal TM's and external ear canals both ears ?Throat: lips, mucosa, and tongue normal; teeth and gums normal ?Neck: no adenopathy, no carotid bruit, supple, symmetrical, trachea midline and thyroid not enlarged, symmetric, no tenderness/mass/nodules ?Back: symmetric, no curvature. ROM normal. No CVA tenderness. ?Lungs: clear to auscultation bilaterally ?Heart: regular rate and rhythm, S1, S2 normal, no murmur, click, rub or gallop ?Abdomen: soft, non-tender; bowel sounds normal; no masses,  no organomegaly ?Pulses: 2+ and symmetric ?Skin: Skin color, texture, turgor normal. No rashes or lesions ?Lymph nodes: Cervical, supraclavicular, and axillary nodes normal. ? ?Lab Results  ?Component Value Date  ? HGBA1C 7.8 (H) 09/21/2021  ? HGBA1C 6.5 (A) 03/02/2021  ? HGBA1C 6.6 (H) 02/24/2020  ? ? ?Lab Results  ?Component Value Date  ? CREATININE 1.97 (H) 09/21/2021  ? CREATININE 1.95 (H) 03/02/2021  ? CREATININE 2.01 (H) 02/24/2020  ? ? ?Lab Results  ?Component Value Date  ? WBC 8.5 09/21/2021  ? HGB 15.1 09/21/2021  ? HCT 45.5 09/21/2021  ? PLT 149.0 (L) 09/21/2021  ? GLUCOSE 151 (H) 09/21/2021  ? CHOL 142 09/21/2021  ? TRIG 195.0 (H) 09/21/2021  ? HDL 34.20 (L) 09/21/2021  ? LDLDIRECT 104.0 03/02/2021  ? [place] 68 09/21/2021  ? ALT 25 09/21/2021  ? AST 17 09/21/2021  ? NA 139  09/21/2021  ? K 4.9 09/21/2021  ? CL 103 09/21/2021  ? CREATININE 1.97 (H) 09/21/2021  ? BUN 38 (H) 09/21/2021  ? CO2 26 09/21/2021  ? TSH 0.11 (L) 09/21/2021  ? PSA 1.14 11/18/2019  ? INR 1.2 09/27/2011  ? HGBA1C 7.8 (H) 09/21/2021  ? MICROALBUR 4.1 (H) 11/18/2019  ? ? ?MR Abdomen W Wo Contrast ? ?Result Date: 06/06/2021 ?CLINICAL DATA:  History of suspected renal mass, also with renal insufficiency by report. EXAM: MRI ABDOMEN WITHOUT AND WITH CONTRAST TECHNIQUE: Multiplanar multisequence MR imaging of the abdomen was performed both before and after the administration of intravenous contrast. CONTRAST:  7.10mL GADAVIST GADOBUTROL 1 MMOL/ML IV SOLN COMPARISON:  Renal sonogram May 03, 2021. FINDINGS: Lower chest: Chronic loculated RIGHT effusion grossly unchanged  compared to imaging dating back to 2019. Hepatobiliary: Cholelithiasis. No substantial biliary duct distension. Possible tiny biliary calculi (image 18/3) distal common bile duct. Caliber of the common bowel duct is between 4 and 5 mm in there is no intrahepatic biliary duct distension. No pericholecystic stranding. No focal, suspicious hepatic lesion. Small hepatic hemangioma in the RIGHT hepatic lobe (image 13/3) 12 x 6 mm. Portal vein is patent. Hepatic veins are patent. Pancreas: Normal intrinsic T1 signal. No ductal dilation or sign of inflammation. No focal lesion. Spleen:  Spleen normal size and contour. Adrenals/Urinary Tract:  RIGHT adrenal gland is normal. LEFT adrenal gland with 1.7 x 1.2 cm LEFT adrenal adenoma. Renal cortical scarring is noted bilaterally. Heterogeneous enhancing lesion arises from the lateral cortex of the RIGHT kidney (image 42/14) this is in the lateral interpolar/upper pole RIGHT kidney measuring 1.6 by 1.5 cm. The lesion is mesophytic and measures approximately 1.6-1.7 cm greatest craniocaudal dimension. No hydronephrosis or perinephric stranding. No additional suspicious renal lesion. No retroperitoneal adenopathy or  renal venous involvement. Stomach/Bowel: Unremarkable by MRI to the extent evaluated on abdominal MRI. Vascular/Lymphatic: No pathologically enlarged lymph nodes identified. No abdominal aortic aneurysm demonstrate

## 2021-09-21 NOTE — Patient Instructions (Addendum)
When you finish your current bottle of Invokamet , do not refill.  We are changing the  medications into two separate ones to save you money: ? ?Farxiga and metformin.  ? ? ?I may add glipizide depending on your a1c today  ?

## 2021-09-22 ENCOUNTER — Encounter: Payer: Self-pay | Admitting: Internal Medicine

## 2021-09-22 MED ORDER — GLIPIZIDE 5 MG PO TABS: 5.0000 mg | ORAL_TABLET | Freq: Every day | ORAL | 1 refills | Status: DC

## 2021-09-22 NOTE — Assessment & Plan Note (Signed)
He has bee referred to nephrology for continued decline in GFR.  His fasting sugars are elevated.  Regimen changed for cost reductio in copay to Metformin XR 750 mg daily and Farxiga 25 mg daily.  Adding glipizide 5 mg   Daily at dinner.  ? ?Lab Results  ?Component Value Date  ? HGBA1C 7.8 (H) 09/21/2021  ? ? ?

## 2021-09-22 NOTE — Assessment & Plan Note (Signed)
Reviewed findings of prior CT scan today..  Patient is tolerating high potency statin therapy  

## 2021-09-22 NOTE — Assessment & Plan Note (Addendum)
Ischemic with mild systolic dysfunction per Dr Clayborn Bigness,  Last ECHO not available despite review of notes from cardiology  He remains asymptomatic at rest.  Continue losartan  And statin.  encouraged to start a daily walking program  ?

## 2021-09-22 NOTE — Assessment & Plan Note (Signed)
.    continue ARB and SGLT2 inhibitor.   Potassium is slowly rising, referral to o nephrology In progress  ?

## 2021-09-22 NOTE — Assessment & Plan Note (Signed)
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 No recurrence, thyroid dose is managed by Dr Gabriel Carina ? ?Lab Results  ?Component Value Date  ? TSH 0.11 (L) 09/21/2021  ? ? ?

## 2021-09-22 NOTE — Assessment & Plan Note (Signed)
He remains asymptomatic with IADLS and does not exercise.  Encouraged to walk daily for exercise  ?

## 2021-10-25 DIAGNOSIS — I10 Essential (primary) hypertension: Secondary | ICD-10-CM | POA: Diagnosis not present

## 2021-10-25 DIAGNOSIS — R809 Proteinuria, unspecified: Secondary | ICD-10-CM | POA: Diagnosis not present

## 2021-10-25 DIAGNOSIS — N2889 Other specified disorders of kidney and ureter: Secondary | ICD-10-CM | POA: Diagnosis not present

## 2021-10-25 DIAGNOSIS — R8290 Unspecified abnormal findings in urine: Secondary | ICD-10-CM | POA: Diagnosis not present

## 2021-10-25 DIAGNOSIS — N1832 Chronic kidney disease, stage 3b: Secondary | ICD-10-CM | POA: Diagnosis not present

## 2021-10-25 DIAGNOSIS — D3502 Benign neoplasm of left adrenal gland: Secondary | ICD-10-CM | POA: Diagnosis not present

## 2021-10-25 DIAGNOSIS — E1122 Type 2 diabetes mellitus with diabetic chronic kidney disease: Secondary | ICD-10-CM | POA: Diagnosis not present

## 2021-10-25 DIAGNOSIS — U071 COVID-19: Secondary | ICD-10-CM | POA: Diagnosis not present

## 2021-11-01 DIAGNOSIS — U071 COVID-19: Secondary | ICD-10-CM | POA: Diagnosis not present

## 2021-11-01 DIAGNOSIS — N2889 Other specified disorders of kidney and ureter: Secondary | ICD-10-CM | POA: Diagnosis not present

## 2021-11-01 DIAGNOSIS — E1122 Type 2 diabetes mellitus with diabetic chronic kidney disease: Secondary | ICD-10-CM | POA: Diagnosis not present

## 2021-11-01 DIAGNOSIS — D3502 Benign neoplasm of left adrenal gland: Secondary | ICD-10-CM | POA: Diagnosis not present

## 2021-11-01 DIAGNOSIS — R8290 Unspecified abnormal findings in urine: Secondary | ICD-10-CM | POA: Diagnosis not present

## 2021-11-01 DIAGNOSIS — I10 Essential (primary) hypertension: Secondary | ICD-10-CM | POA: Diagnosis not present

## 2021-11-01 DIAGNOSIS — R809 Proteinuria, unspecified: Secondary | ICD-10-CM | POA: Diagnosis not present

## 2021-11-01 DIAGNOSIS — N1832 Chronic kidney disease, stage 3b: Secondary | ICD-10-CM | POA: Diagnosis not present

## 2021-11-10 ENCOUNTER — Encounter: Payer: Self-pay | Admitting: Internal Medicine

## 2021-11-10 ENCOUNTER — Ambulatory Visit (INDEPENDENT_AMBULATORY_CARE_PROVIDER_SITE_OTHER): Payer: PPO | Admitting: Internal Medicine

## 2021-11-10 VITALS — BP 142/88 | HR 62 | Temp 97.5°F | Ht 69.0 in | Wt 168.0 lb

## 2021-11-10 DIAGNOSIS — E785 Hyperlipidemia, unspecified: Secondary | ICD-10-CM | POA: Diagnosis not present

## 2021-11-10 DIAGNOSIS — E890 Postprocedural hypothyroidism: Secondary | ICD-10-CM

## 2021-11-10 DIAGNOSIS — J449 Chronic obstructive pulmonary disease, unspecified: Secondary | ICD-10-CM | POA: Diagnosis not present

## 2021-11-10 DIAGNOSIS — I25810 Atherosclerosis of coronary artery bypass graft(s) without angina pectoris: Secondary | ICD-10-CM

## 2021-11-10 DIAGNOSIS — D126 Benign neoplasm of colon, unspecified: Secondary | ICD-10-CM | POA: Diagnosis not present

## 2021-11-10 DIAGNOSIS — E1121 Type 2 diabetes mellitus with diabetic nephropathy: Secondary | ICD-10-CM | POA: Diagnosis not present

## 2021-11-10 DIAGNOSIS — E1169 Type 2 diabetes mellitus with other specified complication: Secondary | ICD-10-CM | POA: Diagnosis not present

## 2021-11-10 MED ORDER — GLIPIZIDE 5 MG PO TABS: ORAL_TABLET | ORAL | 1 refills | Status: DC

## 2021-11-10 NOTE — Patient Instructions (Signed)
I recommend that you increase your evening glipizide dose to 10 mg. ( 2 tablets of the 5 mg)  dose .  We are doing this so that your morning sugars are all < 120      If Your morning sugars start to drop below 80,  go back to using only 5 mg at dinner

## 2021-11-10 NOTE — Progress Notes (Signed)
Subjective:  [name], male    DOB: 12/20/1945  Age: 76 y.o. MRN: [id number]  CC: The primary encounter diagnosis was Hyperlipidemia associated with type 2 diabetes mellitus (HCC). Diagnoses of Postsurgical hypothyroidism, Controlled type 2 diabetes mellitus with diabetic nephropathy, without long-term current use of insulin (HCC), Tubular adenoma of colon, Adenomatous polyp of colon, unspecified part of colon, COPD, mild (HCC), and Coronary artery disease involving autologous vein coronary bypass graft without angina pectoris were also pertinent to this visit.   HPI BOONE GEAR presents for  Chief Complaint  Patient presents with   Follow-up    6 week follow up on diabetes    Lab Results  Component Value Date   HGBA1C 7.8 (H) 09/21/2021    1) T2DM:  reviewed home BS readings.  Fastings checked daily and  range from 103 to 150 mostly,   takes 5 mg glipizide in the morning if the cbg is > 120.  Taking 5 mg in the evening. Regardless of CBG.  Taking metformin as well  .  Not taking [place].  Diet reviewed in detail  He eats 2 hard bananas every night along with strawberries.  Drinks soda rarely  2) CKD:  Saw nephrology last week ,  labs run .  GFR 37 ml/min and stable.  BP normal .  Reviewed note with patient   3) right renal mass/adrenal incidentaloma:  both  stable. By latest MRI.  Has   6 month follow up with Urology, [facility]  \  Outpatient Medications Prior to Visit  Medication Sig Dispense Refill   aspirin (ASPIRIN LOW DOSE) 81 MG EC tablet TAKE 1 TABLET (81 MG TOTAL) BY MOUTH DAILY. SWALLOW WHOLE. 90 tablet 3   atorvastatin (LIPITOR) 40 MG tablet TAKE 1 TABLET BY MOUTH EVERY DAY 90 tablet 1   blood glucose meter kit and supplies KIT Dispense based on patient and insurance preference. Use up to four times daily as directed. (FOR ICD-10 E11.29). 1 each 0   gabapentin (NEURONTIN) 300 MG capsule Take 1 capsule (300 mg total) by mouth 4 (four) times daily. 360  capsule 2   levothyroxine (SYNTHROID) 137 MCG tablet Take 137 mcg by mouth daily before breakfast.     losartan (COZAAR) 50 MG tablet TAKE 1 TABLET BY MOUTH EVERY DAY 90 tablet 3   metFORMIN (GLUCOPHAGE-XR) 750 MG 24 hr tablet Take 1 tablet (750 mg total) by mouth daily with breakfast. 30 tablet 5   omeprazole (PRILOSEC) 20 MG capsule Take 20 mg by mouth daily.     ONETOUCH DELICA LANCETS FINE MISC USE AS DIRECTED 4 TIMES A DAY 100 each 2   ONETOUCH ULTRA test strip USE AS DIRECTED 4 TIMES A DAY 100 strip 2   tamsulosin (FLOMAX) 0.4 MG CAPS capsule TAKE 1 CAPSULE BY MOUTH EVERY DAY 90 capsule 1   glipiZIDE (GLUCOTROL) 5 MG tablet Take 1 tablet (5 mg total) by mouth daily before supper. 90 tablet 1   dapagliflozin propanediol (FARXIGA) 10 MG TABS tablet Take 1 tablet (10 mg total) by mouth daily. (Patient not taking: Reported on 11/10/2021) 30 tablet 5   levothyroxine (SYNTHROID, LEVOTHROID) 150 MCG tablet TAKE ONE TABLET BY MOUTH ONCE DAILY BEFORE BREAKFAST (Patient not taking: Reported on 11/10/2021) 90 tablet 1   No facility-administered medications prior to visit.    Review of Systems;  Patient denies headache, fevers, malaise, unintentional weight loss, skin rash, eye pain, sinus congestion and sinus pain, sore throat, dysphagia,  hemoptysis , cough, dyspnea, wheezing, chest pain, palpitations, orthopnea, edema, abdominal pain, nausea, melena, diarrhea, constipation, flank pain, dysuria, hematuria, urinary  Frequency, nocturia, numbness, tingling, seizures,  Focal weakness, Loss of consciousness,  Tremor, insomnia, depression, anxiety, and suicidal ideation.      Objective:  BP (!) 142/88 (BP Location: Left Arm, Patient Position: Sitting, Cuff Size: Normal)   Pulse 62   Temp (!) 97.5 F (36.4 C) (Oral)   Ht $R'5\' 9"'OJ$  (1.753 m)   Wt 168 lb (76.2 kg)   SpO2 98%   BMI 24.81 kg/m   BP Readings from Last 3 Encounters:  11/10/21 (!) 142/88  09/21/21 120/60  08/03/21 118/68    Wt  Readings from Last 3 Encounters:  11/10/21 168 lb (76.2 kg)  09/21/21 165 lb 9.6 oz (75.1 kg)  08/03/21 171 lb (77.6 kg)    General appearance: alert, cooperative and appears stated age Ears: normal TM's and external ear canals both ears Throat: lips, mucosa, and tongue normal; teeth and gums normal Neck: no adenopathy, no carotid bruit, supple, symmetrical, trachea midline and thyroid not enlarged, symmetric, no tenderness/mass/nodules Back: symmetric, no curvature. ROM normal. No CVA tenderness. Lungs: clear to auscultation bilaterally Heart: regular rate and rhythm, S1, S2 normal, no murmur, click, rub or gallop Abdomen: soft, non-tender; bowel sounds normal; no masses,  no organomegaly Pulses: 2+ and symmetric Skin: Skin color, texture, turgor normal. No rashes or lesions Lymph nodes: Cervical, supraclavicular, and axillary nodes normal.  Lab Results  Component Value Date   HGBA1C 7.8 (H) 09/21/2021   HGBA1C 6.5 (A) 03/02/2021   HGBA1C 6.6 (H) 02/24/2020    Lab Results  Component Value Date   CREATININE 1.97 (H) 09/21/2021   CREATININE 1.95 (H) 03/02/2021   CREATININE 2.01 (H) 02/24/2020    Lab Results  Component Value Date   WBC 8.5 09/21/2021   HGB 15.1 09/21/2021   HCT 45.5 09/21/2021   PLT 149.0 (L) 09/21/2021   GLUCOSE 151 (H) 09/21/2021   CHOL 142 09/21/2021   TRIG 195.0 (H) 09/21/2021   HDL 34.20 (L) 09/21/2021   LDLDIRECT 104.0 03/02/2021   LDLCALC 68 09/21/2021   ALT 25 09/21/2021   AST 17 09/21/2021   NA 139 09/21/2021   K 4.9 09/21/2021   CL 103 09/21/2021   CREATININE 1.97 (H) 09/21/2021   BUN 38 (H) 09/21/2021   CO2 26 09/21/2021   TSH 0.11 (L) 09/21/2021   PSA 1.14 11/18/2019   INR 1.2 09/27/2011   HGBA1C 7.8 (H) 09/21/2021   MICROALBUR 4.1 (H) 11/18/2019    MR Abdomen W Wo Contrast  Result Date: 06/06/2021 CLINICAL DATA:  History of suspected renal mass, also with renal insufficiency by report. EXAM: MRI ABDOMEN WITHOUT AND WITH  CONTRAST TECHNIQUE: Multiplanar multisequence MR imaging of the abdomen was performed both before and after the administration of intravenous contrast. CONTRAST:  7.65mL GADAVIST GADOBUTROL 1 MMOL/ML IV SOLN COMPARISON:  Renal sonogram May 03, 2021. FINDINGS: Lower chest: Chronic loculated RIGHT effusion grossly unchanged compared to imaging dating back to 2019. Hepatobiliary: Cholelithiasis. No substantial biliary duct distension. Possible tiny biliary calculi (image 18/3) distal common bile duct. Caliber of the common bowel duct is between 4 and 5 mm in there is no intrahepatic biliary duct distension. No pericholecystic stranding. No focal, suspicious hepatic lesion. Small hepatic hemangioma in the RIGHT hepatic lobe (image 13/3) 12 x 6 mm. Portal vein is patent. Hepatic veins are patent. Pancreas: Normal intrinsic T1 signal. No ductal dilation or sign  of inflammation. No focal lesion. Spleen:  Spleen normal size and contour. Adrenals/Urinary Tract:  RIGHT adrenal gland is normal. LEFT adrenal gland with 1.7 x 1.2 cm LEFT adrenal adenoma. Renal cortical scarring is noted bilaterally. Heterogeneous enhancing lesion arises from the lateral cortex of the RIGHT kidney (image 42/14) this is in the lateral interpolar/upper pole RIGHT kidney measuring 1.6 by 1.5 cm. The lesion is mesophytic and measures approximately 1.6-1.7 cm greatest craniocaudal dimension. No hydronephrosis or perinephric stranding. No additional suspicious renal lesion. No retroperitoneal adenopathy or renal venous involvement. Stomach/Bowel: Unremarkable by MRI to the extent evaluated on abdominal MRI. Vascular/Lymphatic: No pathologically enlarged lymph nodes identified. No abdominal aortic aneurysm demonstrated. Other:  None Musculoskeletal: No suspicious bone lesions identified. 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 No evidence of metastatic disease in the abdomen. Cholelithiasis. Tiny biliary calculi in the distal common bile duct. Negative for biliary duct distension or pericholecystic stranding. Correlate with any symptoms of biliary colic. Chronic loculated RIGHT effusion grossly unchanged compared to imaging dating back to 2019. LEFT adrenal adenoma. These results will be called to the ordering clinician or representative by the Radiologist Assistant, and communication documented in the PACS or [employer]. Electronically Signed   By: Zetta Bills M.D.   On: 06/06/2021 17:52    Assessment & Plan:   Problem List Items Addressed This Visit     Hyperlipidemia associated with type 2 diabetes mellitus ([place]) - Primary   Relevant Medications   glipiZIDE (GLUCOTROL) 5 MG tablet   Other Relevant Orders   Hemoglobin A1c   Comprehensive metabolic panel   Lipid panel   Postsurgical hypothyroidism   Relevant Medications   levothyroxine (SYNTHROID) 137 MCG tablet   Other Relevant Orders   TSH   Type 2 diabetes mellitus, controlled, with renal complications (HCC)    Control improved but not at goal Based on readings.  Advised to incresae evening dose of glipizide to 10 mg at diinnertime   Lab Results  Component Value Date   HGBA1C 7.8 (H) 09/21/2021   Lab Results  Component Value Date   LABMICR 56.6 11/28/2020   MICROALBUR 4.1 (H) 11/18/2019   MICROALBUR 7.2 (H) 01/28/2018          Relevant Medications   glipiZIDE (GLUCOTROL) 5 MG tablet   Tubular adenoma of colon    Several retrieved from Sept 2022 colonoscopy.  Follow up in 3 years.        Polyp of colon, adenomatous    Repeat done Sept 2022;  2 TA's removed.  Repeat in 3 years        COPD, mild ([place])    He remains asymptomatic with IADLS and does not exercise.  Encouraged to walk daily for exercise        CAD (coronary artery disease), autologous vein bypass graft     Asymptomatic.  Continue current medications.        I spent a total of 28 minutes with this patient in a face to face visit on the date of this encounter reviewing the last office visit with me on March 30,  his most recent  visit with his nephrologist and urologist , patient'ss diet and eating habits, home blood pressure readings ,  most recent imaging study ,   and post visit ordering of testing and therapeutics.    Follow-up: Return in  about 2 months (around 01/24/2022) for follow up diabetes.   Crecencio Mc, MD

## 2021-11-12 NOTE — Assessment & Plan Note (Signed)
Several retrieved from Sept 2022 colonoscopy.  Follow up in 3 years.

## 2021-11-12 NOTE — Assessment & Plan Note (Signed)
Asymptomatic.  Continue current medications.

## 2021-11-12 NOTE — Assessment & Plan Note (Signed)
Control improved but not at goal Based on readings.  Advised to incresae evening dose of glipizide to 10 mg at diinnertime   Lab Results  Component Value Date   HGBA1C 7.8 (H) 09/21/2021   Lab Results  Component Value Date   LABMICR 56.6 11/28/2020   MICROALBUR 4.1 (H) 11/18/2019   MICROALBUR 7.2 (H) 01/28/2018

## 2021-11-12 NOTE — Assessment & Plan Note (Signed)
Repeat done Sept 2022;  2 TA's removed.  Repeat in 3 years

## 2021-11-12 NOTE — Assessment & Plan Note (Signed)
He remains asymptomatic with IADLS and does not exercise.  Encouraged to walk daily for exercise

## 2021-11-17 ENCOUNTER — Other Ambulatory Visit: Payer: Self-pay | Admitting: Internal Medicine

## 2021-12-12 ENCOUNTER — Ambulatory Visit: Payer: PPO | Admitting: Urology

## 2021-12-12 ENCOUNTER — Ambulatory Visit
Admission: RE | Admit: 2021-12-12 | Discharge: 2021-12-12 | Disposition: A | Discharge status: Home / Self Care | Payer: PPO | Source: Ambulatory Visit | Attending: Urology | Admitting: Urology

## 2021-12-12 DIAGNOSIS — N2889 Other specified disorders of kidney and ureter: Secondary | ICD-10-CM | POA: Insufficient documentation

## 2021-12-12 DIAGNOSIS — K8020 Calculus of gallbladder without cholecystitis without obstruction: Secondary | ICD-10-CM | POA: Diagnosis not present

## 2021-12-12 DIAGNOSIS — N281 Cyst of kidney, acquired: Secondary | ICD-10-CM | POA: Diagnosis not present

## 2021-12-12 DIAGNOSIS — K5730 Diverticulosis of large intestine without perforation or abscess without bleeding: Secondary | ICD-10-CM | POA: Diagnosis not present

## 2021-12-12 IMAGING — MR MR ABDOMEN WO/W CM
18 series · 48 of 48 positions shown · IV contrast (7.5ml Gadavist)
Comparison: MRI abdomen [DATE]

CLINICAL DATA: Right renal mass follow-up

EXAM:
MRI ABDOMEN WITHOUT AND WITH CONTRAST
TECHNIQUE: Multiplanar multisequence MR imaging of the abdomen was performed
both before and after the administration of intravenous contrast.
CONTRAST:  7.5mL GADAVIST GADOBUTROL 1 MMOL/ML IV SOLN

[Series 3: cor haste · coronal · 6.0mm · 1.19mm/px · 2 of 32 slices shown]
[im 1/32]
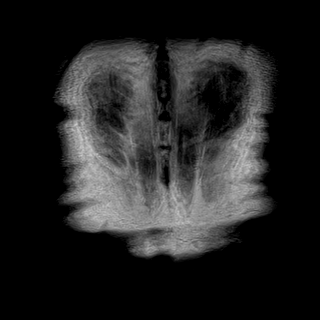
[im 32/32]
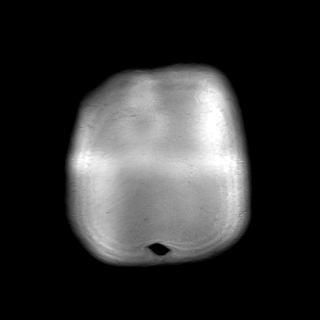

[Series 5: T2 fat-sat · axial · 6.0mm · 1.19mm/px · z∈[-142,+82]mm · 2 of 32 slices shown]
[im 1/32]
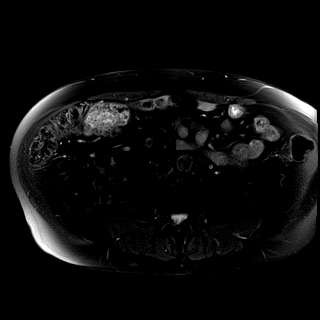
[im 32/32]
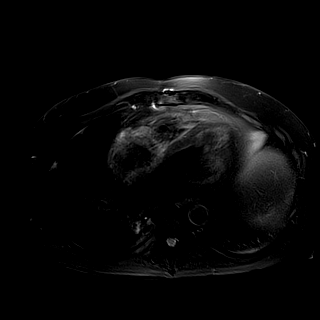

[Series 7: DWI · axial · 6.0mm · 1.42mm/px · z∈[-142,+82]mm · 5 of 96 slices shown (1 of 2)]
[im 1/96]
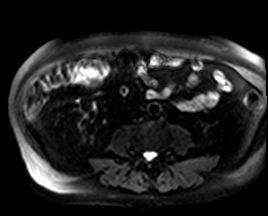
[im 24/96]
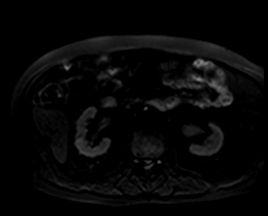
[im 48/96]
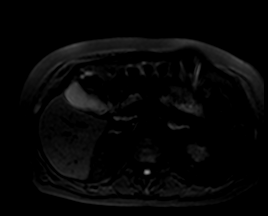
[im 72/96]
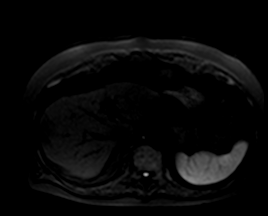
[im 96/96]
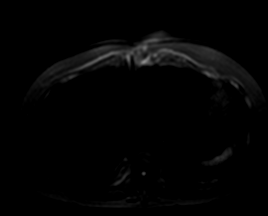

[Series 8: DWI · axial · 6.0mm · 1.42mm/px · 1 of 32 slices shown (2 of 2)]
[im 1/32]
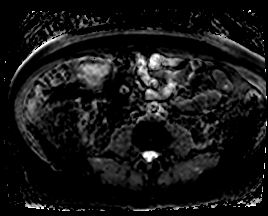

[Series 9: in & out · axial · 3.0mm · 1.19mm/px · z∈[-148,+89]mm · 3 of 80 slices shown (1 of 2)]
[im 1/80]
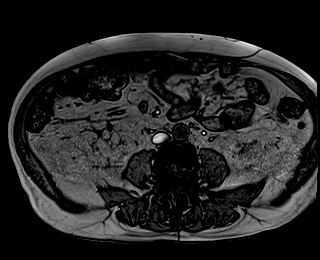
[im 40/80]
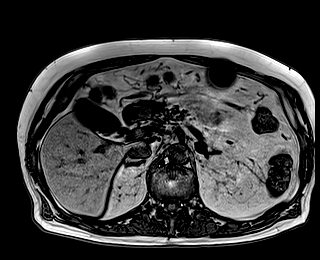
[im 80/80]
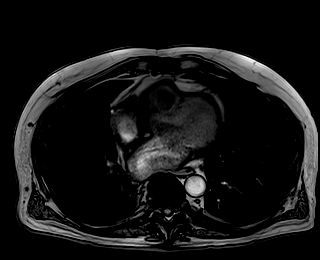

[Series 10: in & out · axial · 3.0mm · 1.19mm/px · z∈[-148,+89]mm · 3 of 80 slices shown (2 of 2)]
[im 1/80]
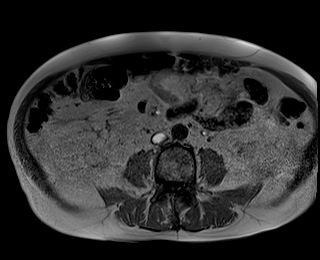
[im 40/80]
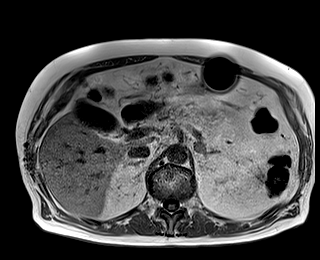
[im 80/80]
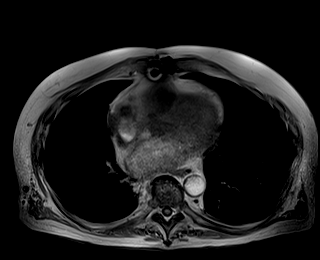

[Series 11: T2 · axial · 6.0mm · 1.19mm/px · 1 of 32 slices shown]
[im 1/32]
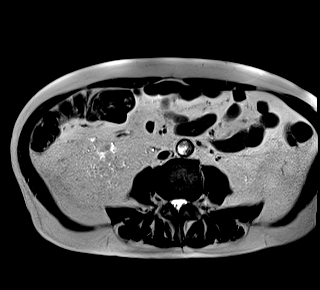

[Series 12: bSSFP · axial · 6.0mm · 0.74mm/px · 1 of 34 slices shown]
[im 1/34]
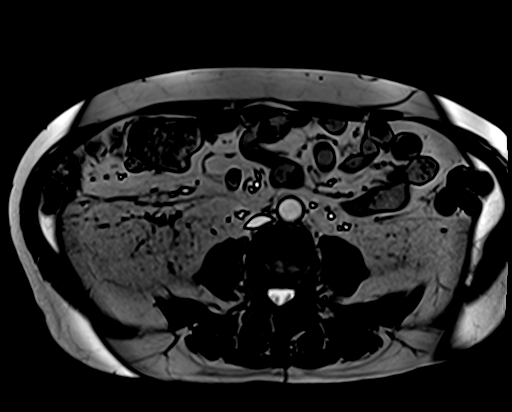

[Series 13: T1 dynamic fat-sat · axial · non-contrast · 3.0mm · 1.19mm/px · z∈[-148,+89]mm · 3 of 80 slices shown (1 of 5)]
[im 1/80]
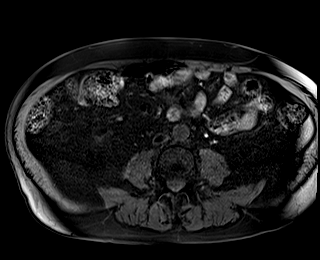
[im 40/80]
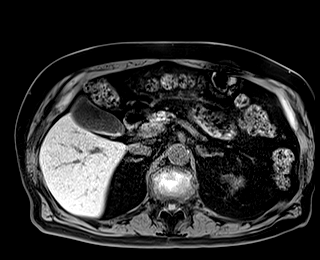
[im 80/80]
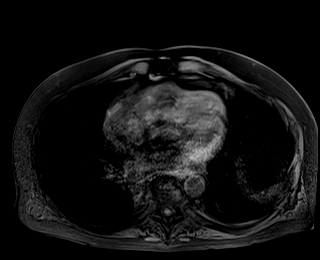

[Series 14: T1 dynamic fat-sat post-contrast · axial · 3.0mm · 1.19mm/px · z∈[-148,+89]mm · 3 of 80 slices shown (1 of 4)]
[im 1/80]
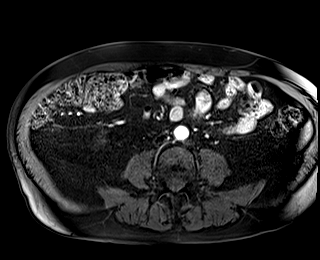
[im 40/80]
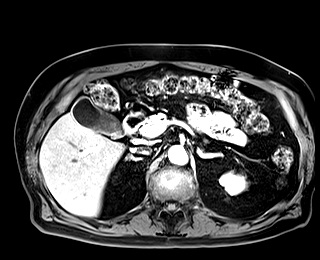
[im 80/80]
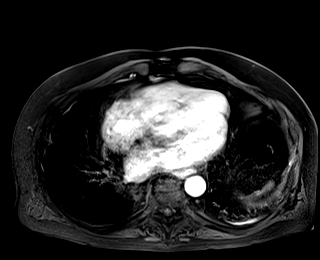

[Series 15: T1 dynamic fat-sat · axial · 3.0mm · 1.19mm/px · z∈[-148,+89]mm · 3 of 80 slices shown (2 of 5)]
[im 1/80]
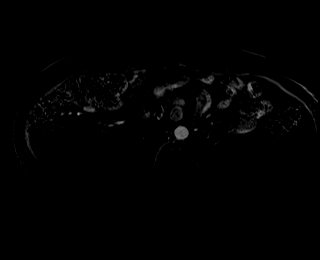
[im 40/80]
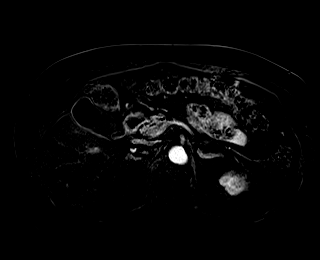
[im 80/80]
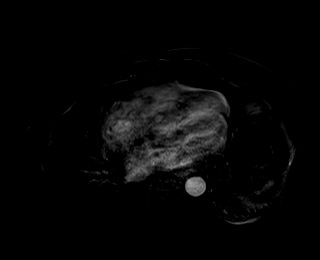

[Series 16: T1 dynamic fat-sat post-contrast · axial · 3.0mm · 1.19mm/px · z∈[-148,+89]mm · 3 of 80 slices shown (2 of 4)]
[im 1/80]
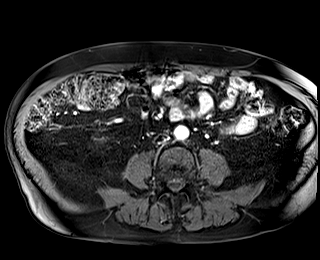
[im 40/80]
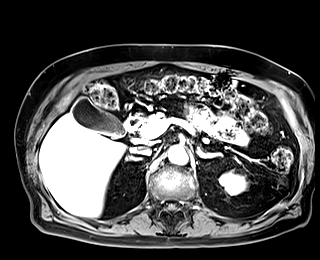
[im 80/80]
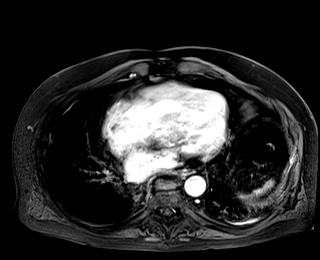

[Series 17: T1 dynamic fat-sat · axial · 3.0mm · 1.19mm/px · z∈[-148,+89]mm · 3 of 80 slices shown (3 of 5)]
[im 1/80]
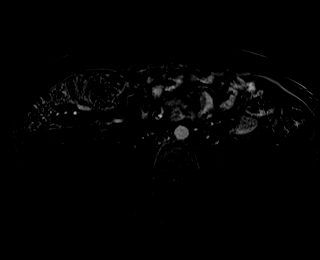
[im 40/80]
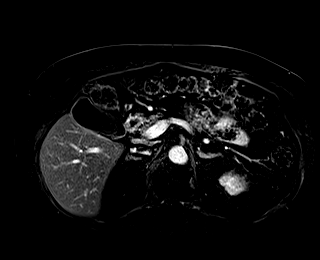
[im 80/80]
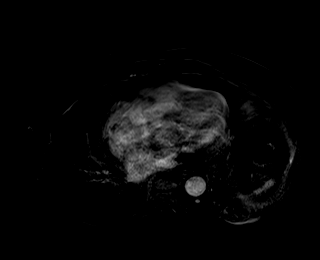

[Series 18: T1 dynamic fat-sat post-contrast · axial · 3.0mm · 1.19mm/px · z∈[-148,+89]mm · 3 of 80 slices shown (3 of 4)]
[im 1/80]
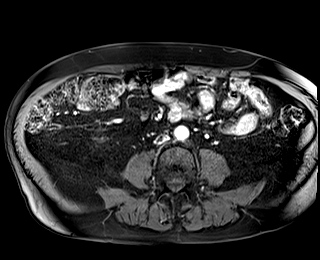
[im 40/80]
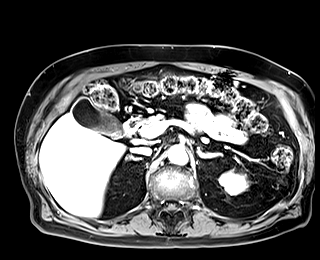
[im 80/80]
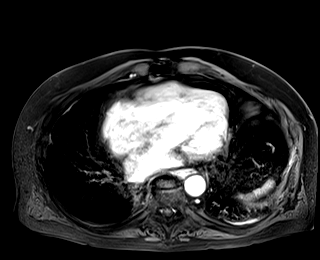

[Series 19: T1 dynamic fat-sat · axial · 3.0mm · 1.19mm/px · z∈[-148,+89]mm · 3 of 80 slices shown (4 of 5)]
[im 1/80]
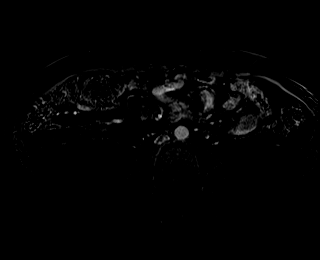
[im 40/80]
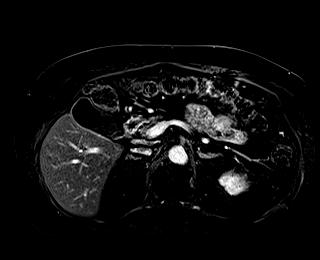
[im 80/80]
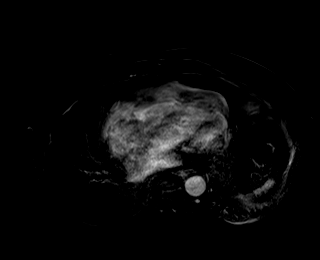

[Series 20: T1 dynamic post-contrast · coronal · 3.0mm · 1.31mm/px · 3 of 72 slices shown]
[im 1/72]
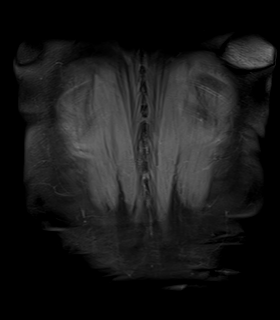
[im 36/72]
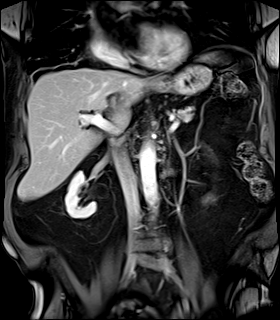
[im 72/72]
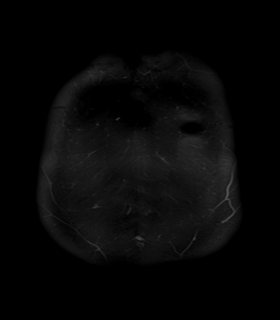

[Series 21: T1 dynamic fat-sat post-contrast · axial · 3.0mm · 1.19mm/px · z∈[-148,+89]mm · 3 of 80 slices shown (4 of 4)]
[im 1/80]
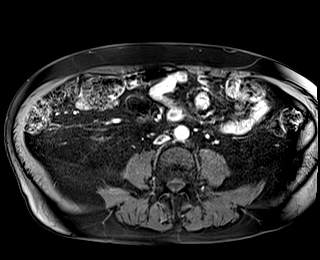
[im 40/80]
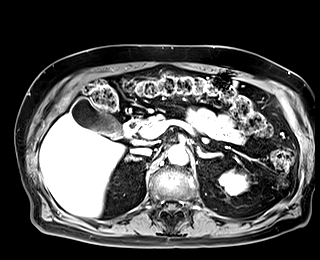
[im 80/80]
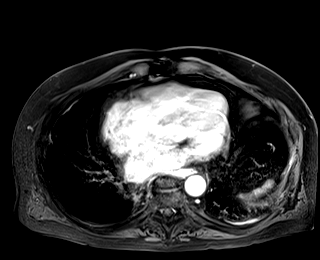

[Series 22: T1 dynamic fat-sat · axial · 3.0mm · 1.19mm/px · z∈[-148,+89]mm · 3 of 80 slices shown (5 of 5)]
[im 1/80]
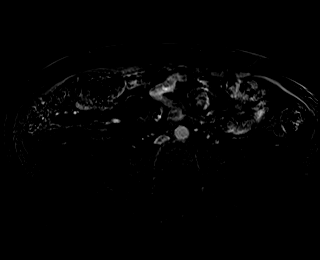
[im 40/80]
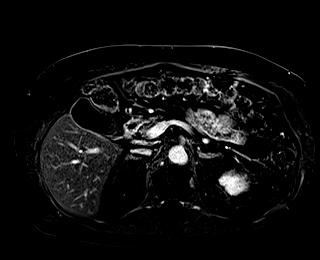
[im 80/80]
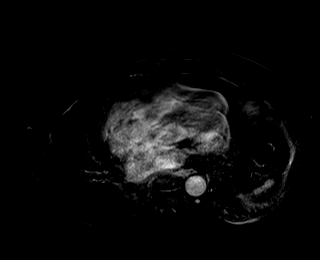

[48 of 48 positions shown; findings below may reference images not displayed]

FINDINGS: Lower chest: Grossly stable appearing chronic loculated pleural
effusion at the right lung base.

Hepatobiliary: Liver is normal in size in contour with no suspicious
mass identified. 1.2 cm hemangioma in the central right hepatic
lobe. Small amount of dependent cholelithiasis. No gallbladder wall
thickening or biliary ductal dilatation.

Pancreas: No mass, inflammatory changes, or other parenchymal
abnormality identified.

Spleen:  Within normal limits in size and appearance.

Adrenals/Urinary Tract: Stable left adrenal gland nodule consistent
with adenoma. Heterogeneously enhancing mass at the lateral aspect
of the right kidney again seen measuring 1.6 cm in size, unchanged.
There is also a 1 cm hemorrhagic cyst in the lower pole right
kidney. No hydronephrosis.

Stomach/Bowel: Colonic diverticulosis. No evidence of bowel
obstruction.

Vascular/Lymphatic: Renal veins are within normal limits. No bulky
lymphadenopathy identified.

Other:  No ascites.

Musculoskeletal: No suspicious bony lesions.
IMPRESSION: 1. Stable enhancing mass in the lateral right kidney measuring
cm, most consistent with renal cell carcinoma. No evidence of
metastatic disease.
2. Hemorrhagic cyst in the right kidney.
3. Colonic diverticulosis.
4. Other chronic findings as described.

## 2021-12-12 MED ORDER — GADOBUTROL 1 MMOL/ML IV SOLN
7.5000 mL | Freq: Once | INTRAVENOUS | Status: AC | PRN
  Administered 2021-12-12: 7.5 mL via INTRAVENOUS

## 2021-12-19 ENCOUNTER — Encounter: Payer: Self-pay | Admitting: Urology

## 2021-12-19 ENCOUNTER — Ambulatory Visit: Payer: PPO | Admitting: Urology

## 2021-12-19 VITALS — BP 83/52 | HR 83 | Ht 70.0 in | Wt 168.0 lb

## 2021-12-19 DIAGNOSIS — N2889 Other specified disorders of kidney and ureter: Secondary | ICD-10-CM

## 2021-12-19 DIAGNOSIS — N1832 Chronic kidney disease, stage 3b: Secondary | ICD-10-CM | POA: Diagnosis not present

## 2021-12-24 ENCOUNTER — Other Ambulatory Visit: Payer: Self-pay | Admitting: Internal Medicine

## 2022-01-24 ENCOUNTER — Other Ambulatory Visit: Payer: Self-pay

## 2022-01-24 NOTE — Patient Outreach (Signed)
  Care Coordination   Initial Visit Note   01/24/2022 Name: Nathaniel Lane MRN: [id number] DOB: 05-Apr-1946  Nathaniel Lane is a 76 y.o. year old male who sees Derrel Nip, Aris Everts, MD for primary care. I spoke with  Nathaniel Lane by phone today  What matters to the patients health and wellness today?  Managing diabetes.  Patient states he feels he knows what to do to manage his diabetes.    Goals Addressed             This Visit's Progress    Patient Stated, just managing my diabetes.       Care Coordination Interventions: Provided education to patient about basic DM disease process Reviewed medications with patient and discussed importance of medication adherence Reviewed scheduled/upcoming provider appointments  Provided patient with written education material on diabetes management.  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   Encounter Outcome:  Pt. Visit Completed   Quinn Plowman RN,BSN,CCM [place] Coordinator [phone]

## 2022-01-24 NOTE — Patient Instructions (Signed)
Visit Information  Thank you for taking time to visit with me today. Please don't hesitate to contact me if I can be of assistance to you.   Following are the goals we discussed today:   Goals Addressed             This Visit's Progress    Patient Stated, just managing my diabetes.       Care Coordination Interventions: Provided education to patient about basic DM disease process Reviewed medications with patient and discussed importance of medication adherence Reviewed scheduled/upcoming provider appointments  Provided patient with written education material on diabetes management.  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   No further follow up required:    Quinn Plowman [facility] [place] Coordinator [phone]

## 2022-01-26 ENCOUNTER — Other Ambulatory Visit (INDEPENDENT_AMBULATORY_CARE_PROVIDER_SITE_OTHER): Payer: PPO

## 2022-01-26 DIAGNOSIS — E890 Postprocedural hypothyroidism: Secondary | ICD-10-CM | POA: Diagnosis not present

## 2022-01-26 DIAGNOSIS — E1169 Type 2 diabetes mellitus with other specified complication: Secondary | ICD-10-CM | POA: Diagnosis not present

## 2022-01-26 DIAGNOSIS — E785 Hyperlipidemia, unspecified: Secondary | ICD-10-CM

## 2022-01-26 LAB — LIPID PANEL
Cholesterol: 132 mg/dL (ref 0–200)
HDL: 30.1 mg/dL — ABNORMAL LOW (ref >39.00)
LDL Cholesterol: 64 mg/dL (ref 0–99)
NonHDL: 101.86
Total CHOL/HDL Ratio: 4
Triglycerides: 189 mg/dL — ABNORMAL HIGH (ref 0.0–149.0)
VLDL: 37.8 mg/dL (ref 0.0–40.0)

## 2022-01-26 LAB — COMPREHENSIVE METABOLIC PANEL
ALT: 20 U/L (ref 0–53)
AST: 18 U/L (ref 0–37)
Albumin: 4.1 g/dL (ref 3.5–5.2)
Alkaline Phosphatase: 118 U/L — ABNORMAL HIGH (ref 39–117)
BUN: 43 mg/dL — ABNORMAL HIGH (ref 6–23)
CO2: 25 mEq/L (ref 19–32)
Calcium: 9.7 mg/dL (ref 8.4–10.5)
Chloride: 103 mEq/L (ref 96–112)
Creatinine, Ser: 1.86 mg/dL — ABNORMAL HIGH (ref 0.40–1.50)
GFR: 35.3 mL/min — ABNORMAL LOW (ref >60.00)
Glucose, Bld: 132 mg/dL — ABNORMAL HIGH (ref 70–99)
Potassium: 4.9 mEq/L (ref 3.5–5.1)
Sodium: 136 mEq/L (ref 135–145)
Total Bilirubin: 0.5 mg/dL (ref 0.2–1.2)
Total Protein: 7.3 g/dL (ref 6.0–8.3)

## 2022-01-26 LAB — TSH: TSH: 0.29 u[IU]/mL — ABNORMAL LOW (ref 0.35–5.50)

## 2022-01-26 LAB — HEMOGLOBIN A1C: Hgb A1c MFr Bld: 6.2 % (ref 4.6–6.5)

## 2022-01-31 ENCOUNTER — Ambulatory Visit (INDEPENDENT_AMBULATORY_CARE_PROVIDER_SITE_OTHER): Payer: PPO | Admitting: Internal Medicine

## 2022-01-31 ENCOUNTER — Encounter: Payer: Self-pay | Admitting: Internal Medicine

## 2022-01-31 VITALS — BP 128/70 | HR 68 | Temp 97.2°F | Ht 70.0 in | Wt 170.2 lb

## 2022-01-31 DIAGNOSIS — N183 Chronic kidney disease, stage 3 unspecified: Secondary | ICD-10-CM

## 2022-01-31 DIAGNOSIS — E1121 Type 2 diabetes mellitus with diabetic nephropathy: Secondary | ICD-10-CM

## 2022-01-31 DIAGNOSIS — Z1211 Encounter for screening for malignant neoplasm of colon: Secondary | ICD-10-CM

## 2022-01-31 DIAGNOSIS — E890 Postprocedural hypothyroidism: Secondary | ICD-10-CM | POA: Diagnosis not present

## 2022-01-31 DIAGNOSIS — E1122 Type 2 diabetes mellitus with diabetic chronic kidney disease: Secondary | ICD-10-CM

## 2022-01-31 DIAGNOSIS — N2889 Other specified disorders of kidney and ureter: Secondary | ICD-10-CM | POA: Diagnosis not present

## 2022-01-31 DIAGNOSIS — D126 Benign neoplasm of colon, unspecified: Secondary | ICD-10-CM | POA: Diagnosis not present

## 2022-01-31 LAB — MICROALBUMIN / CREATININE URINE RATIO
Creatinine,U: 29.6 mg/dL
Microalb Creat Ratio: 8.7 mg/g (ref 0.0–30.0)
Microalb, Ur: 2.6 mg/dL — ABNORMAL HIGH (ref 0.0–1.9)

## 2022-01-31 MED ORDER — GLIPIZIDE 5 MG PO TABS: 5.0000 mg | ORAL_TABLET | Freq: Two times a day (BID) | ORAL | 1 refills | Status: DC

## 2022-01-31 NOTE — Progress Notes (Signed)
Subjective:  [name], male    DOB: 01/25/1946  Age: 76 y.o. MRN: [id number]  CC: The primary encounter diagnosis was Controlled type 2 diabetes mellitus with diabetic nephropathy, without long-term current use of insulin ([place]). Diagnoses of Adenomatous polyp of colon, unspecified part of colon, Postsurgical hypothyroidism, Colon AND PROSTATE CANCER SCREENING , CKD stage 3 due to type 2 diabetes mellitus ([place]), and Renal mass, right were also pertinent to this visit.   HPI Nathaniel Lane presents for  Chief Complaint  Patient presents with   Follow-up    2 month follow up on diabetes    1)   Type 2 DM with CKD :  He feels generally well, is  checking fasting blood sugars daily.   BS have been quite variable, ranging from 85 to 155 . He denies  any recent hypoglyemic events but has woken up several times around 2 am in a full body sweat and feeling out of sorts, but has not checked BS at that time.   Taking his  glipizide 5 mg qam  10 mg qpm ac.  Taking farxiga and metformin as directed. Diet reviewed.  Spaghetti dinner did not result in high fasting sugar, but a   dinner of  Sweet potato ,  grilled chicken resulted in a fasting sugar of 185 the following morning.  [place] Kitchen   2) HTN:  Hypertension: patient checks blood pressure twice weekly at home.  Readings have been for the most part < 140/80 at rest . Patient is following a reduce salt diet most days and is taking medications as prescribed  Lab Results  Component Value Date   HGBA1C 6.2 01/26/2022   2) Nocturia:  since starting Farxiga he notes increased nocturia (1-2 times per night)  he is drinking a full glass of water  at 7 pm and in bed by 9 pm.       Outpatient Medications Prior to Visit  Medication Sig Dispense Refill   aspirin EC (ASPIRIN LOW DOSE) 81 MG tablet TAKE 1 TABLET (81 MG TOTAL) BY MOUTH DAILY. SWALLOW WHOLE. 90 tablet 3   atorvastatin (LIPITOR) 40 MG tablet TAKE 1 TABLET BY MOUTH EVERY DAY 90 tablet 1    blood glucose meter kit and supplies KIT Dispense based on patient and insurance preference. Use up to four times daily as directed. (FOR ICD-10 E11.29). 1 each 0   gabapentin (NEURONTIN) 300 MG capsule Take 1 capsule (300 mg total) by mouth 4 (four) times daily. 360 capsule 2   levothyroxine (SYNTHROID) 137 MCG tablet Take 137 mcg by mouth daily before breakfast.     losartan (COZAAR) 50 MG tablet TAKE 1 TABLET BY MOUTH EVERY DAY 90 tablet 3   metFORMIN (GLUCOPHAGE-XR) 750 MG 24 hr tablet Take 1 tablet (750 mg total) by mouth daily with breakfast. 30 tablet 5   omeprazole (PRILOSEC) 20 MG capsule Take 20 mg by mouth daily.     ONETOUCH DELICA LANCETS FINE MISC USE AS DIRECTED 4 TIMES A DAY 100 each 2   ONETOUCH ULTRA test strip USE AS DIRECTED 4 TIMES A DAY 100 strip 2   tamsulosin (FLOMAX) 0.4 MG CAPS capsule TAKE 1 CAPSULE BY MOUTH EVERY DAY 90 capsule 1   glipiZIDE (GLUCOTROL) 5 MG tablet One tablet in the morning,  2 at night 270 tablet 1   FARXIGA 10 MG TABS tablet Take 10 mg by mouth daily.     No facility-administered medications prior to visit.  Review of Systems;  Patient denies headache, fevers, malaise, unintentional weight loss, skin rash, eye pain, sinus congestion and sinus pain, sore throat, dysphagia,  hemoptysis , cough, dyspnea, wheezing, chest pain, palpitations, orthopnea, edema, abdominal pain, nausea, melena, diarrhea, constipation, flank pain, dysuria, hematuria, urinary  Frequency, nocturia, numbness, tingling, seizures,  Focal weakness, Loss of consciousness,  Tremor, insomnia, depression, anxiety, and suicidal ideation.      Objective:  BP 128/70 (BP Location: Left Arm, Patient Position: Sitting, Cuff Size: Normal)   Pulse 68   Temp (!) 97.2 F (36.2 C) (Oral)   Ht _0  (1.778 m)   Wt 170 lb 3.2 oz (77.2 kg)   SpO2 95%   BMI 24.42 kg/m   BP Readings from Last 3 Encounters:  01/31/22 128/70  12/19/21 (!) 83/52  11/10/21 (!) 142/88    Wt Readings  from Last 3 Encounters:  01/31/22 170 lb 3.2 oz (77.2 kg)  12/19/21 168 lb (76.2 kg)  11/10/21 168 lb (76.2 kg)    General appearance: alert, cooperative and appears stated age Ears: normal TM's and external ear canals both ears Throat: lips, mucosa, and tongue normal; teeth and gums normal Neck: no adenopathy, no carotid bruit, supple, symmetrical, trachea midline and thyroid not enlarged, symmetric, no tenderness/mass/nodules Back: symmetric, no curvature. ROM normal. No CVA tenderness. Lungs: clear to auscultation bilaterally Heart: regular rate and rhythm, S1, S2 normal, no murmur, click, rub or gallop Abdomen: soft, non-tender; bowel sounds normal; no masses,  no organomegaly Pulses: 2+ and symmetric Skin: Skin color, texture, turgor normal. No rashes or lesions Lymph nodes: Cervical, supraclavicular, and axillary nodes normal.  Lab Results  Component Value Date   HGBA1C 6.2 01/26/2022   HGBA1C 7.8 (H) 09/21/2021   HGBA1C 6.5 (A) 03/02/2021    Lab Results  Component Value Date   CREATININE 1.86 (H) 01/26/2022   CREATININE 1.97 (H) 09/21/2021   CREATININE 1.95 (H) 03/02/2021    Lab Results  Component Value Date   WBC 8.5 09/21/2021   HGB 15.1 09/21/2021   HCT 45.5 09/21/2021   PLT 149.0 (L) 09/21/2021   GLUCOSE 132 (H) 01/26/2022   CHOL 132 01/26/2022   TRIG 189.0 (H) 01/26/2022   HDL 30.10 (L) 01/26/2022   LDLDIRECT 104.0 03/02/2021   LDLCALC 64 01/26/2022   ALT 20 01/26/2022   AST 18 01/26/2022   NA 136 01/26/2022   K 4.9 01/26/2022   CL 103 01/26/2022   CREATININE 1.86 (H) 01/26/2022   BUN 43 (H) 01/26/2022   CO2 25 01/26/2022   TSH 0.29 (L) 01/26/2022   PSA 1.14 11/18/2019   INR 1.2 09/27/2011   HGBA1C 6.2 01/26/2022   MICROALBUR 2.6 (H) 01/31/2022    MR ABDOMEN WWO CONTRAST  Result Date: 12/12/2021 CLINICAL DATA:  Right renal mass follow-up EXAM: MRI ABDOMEN WITHOUT AND WITH CONTRAST TECHNIQUE: Multiplanar multisequence MR imaging of the abdomen  was performed both before and after the administration of intravenous contrast. CONTRAST:  7.63m GADAVIST GADOBUTROL 1 MMOL/ML IV SOLN COMPARISON:  MRI abdomen 06/06/2021 FINDINGS: Lower chest: Grossly stable appearing chronic loculated pleural effusion at the right lung base. Hepatobiliary: Liver is normal in size in contour with no suspicious mass identified. 1.2 cm hemangioma in the central right hepatic lobe. Small amount of dependent cholelithiasis. No gallbladder wall thickening or biliary ductal dilatation. Pancreas: No mass, inflammatory changes, or other parenchymal abnormality identified. Spleen:  Within normal limits in size and appearance. Adrenals/Urinary Tract: Stable left adrenal gland nodule consistent with  adenoma. Heterogeneously enhancing mass at the lateral aspect of the right kidney again seen measuring 1.6 cm in size, unchanged. There is also a 1 cm hemorrhagic cyst in the lower pole right kidney. No hydronephrosis. Stomach/Bowel: Colonic diverticulosis. No evidence of bowel obstruction. Vascular/Lymphatic: Renal veins are within normal limits. No bulky lymphadenopathy identified. Other:  No ascites. Musculoskeletal: No suspicious bony lesions. IMPRESSION: 1. Stable enhancing mass in the lateral right kidney measuring 1.6 cm, most consistent with renal cell carcinoma. No evidence of metastatic disease. 2. Hemorrhagic cyst in the right kidney. 3. Colonic diverticulosis. 4. Other chronic findings as described. Electronically Signed   By: Ofilia Neas M.D.   On: 12/12/2021 17:11    Assessment & Plan:   Problem List Items Addressed This Visit     Polyp of colon, adenomatous   Type 2 diabetes mellitus, controlled, with renal complications ([place]) - Primary    Given his A1c of 6.2,  His high fasting morning sugars are likely due to hypoglycemic events during sleep.  I have advised him to reduce the evening dose of glipizide to 5 mg  .  Continue metformin,  Losartan foe early  microalbuminuria ,  And  atorvastatin.   for early microalbuninuria Lab Results  Component Value Date   HGBA1C 6.2 01/26/2022   Lab Results  Component Value Date   LABMICR 56.6 11/28/2020   MICROALBUR 2.6 (H) 01/31/2022   MICROALBUR 4.1 (H) 11/18/2019          Relevant Medications   FARXIGA 10 MG TABS tablet   glipiZIDE (GLUCOTROL) 5 MG tablet   Other Relevant Orders   Urine Microalbumin w/creat. ratio (Completed)   Hemoglobin A1c   Lipid panel   Comprehensive metabolic panel   Postsurgical hypothyroidism    Reviewed his Feb 2023 visit with Dr Gabriel Carina.  Goal TSH is met.   "As he is now nearly 9 years post-treatment, it is reasonable to keep TSH level in the 0.3 - 2.5 uIU/ml range. Hypothyroidism is mildly over treated. Will reduce levothyroxine to 137 mcg daily."   Lab Results  Component Value Date   TSH 0.29 (L) 01/26/2022         CKD stage 3 due to type 2 diabetes mellitus ([place])    .  FR is stable and his tolerating e ARB and SGLT2 inhibitor.  He has been referred to nephrology   Lab Results  Component Value Date   NA 136 01/26/2022   K 4.9 01/26/2022   CL 103 01/26/2022   CO2 25 01/26/2022   Lab Results  Component Value Date   CREATININE 1.86 (H) 01/26/2022         Relevant Medications   FARXIGA 10 MG TABS tablet   glipiZIDE (GLUCOTROL) 5 MG tablet   Colon AND PROSTATE CANCER SCREENING     He IS OVERDUE for PSA and not due yet for follow up colonoscopy  Lab Results  Component Value Date   PSA 1.14 11/18/2019   PSA 1.35 12/12/2016   PSA 1.20 10/26/2015          Relevant Orders   PSA, Medicare   Renal mass, right    He continues active surveillance with serial MRI's. most recent MRI dated 12/12/2021 that shows a stable 1.6 cm heterogenously enhancing small right renal mass, unchanged in size from prior imaging.       I spent a total of 30  minutes with this patient in a face to face visit on the date of  this encounter reviewing the last office  visit with me in June,  his  most recent follow Vaughan Sine,  nephrology and Endocrinology, eating habits, home blood pressure readings ,  most recent imaging study ,   and post visit ordering of testing and therapeutics.    Follow-up: Return in about 3 months (around 05/03/2022) for follow up diabetes.   Crecencio Mc, MD

## 2022-01-31 NOTE — Patient Instructions (Addendum)
Your diabetes IS NOW  under excellent control  And your cholesterol and other labs are EITHER STABLE OR IMPROVING.   Reduce your evening glipizide to 5 mg (one tablet)  WITH DINNER,  BECAUSE  I think  You are having low blood sugars in the middle of the night.

## 2022-02-01 DIAGNOSIS — N1832 Chronic kidney disease, stage 3b: Secondary | ICD-10-CM | POA: Diagnosis not present

## 2022-02-01 DIAGNOSIS — U071 COVID-19: Secondary | ICD-10-CM | POA: Diagnosis not present

## 2022-02-01 DIAGNOSIS — N2889 Other specified disorders of kidney and ureter: Secondary | ICD-10-CM | POA: Diagnosis not present

## 2022-02-01 DIAGNOSIS — I10 Essential (primary) hypertension: Secondary | ICD-10-CM | POA: Diagnosis not present

## 2022-02-01 DIAGNOSIS — E1122 Type 2 diabetes mellitus with diabetic chronic kidney disease: Secondary | ICD-10-CM | POA: Diagnosis not present

## 2022-02-01 DIAGNOSIS — R8290 Unspecified abnormal findings in urine: Secondary | ICD-10-CM | POA: Diagnosis not present

## 2022-02-01 DIAGNOSIS — D3502 Benign neoplasm of left adrenal gland: Secondary | ICD-10-CM | POA: Diagnosis not present

## 2022-02-01 DIAGNOSIS — R809 Proteinuria, unspecified: Secondary | ICD-10-CM | POA: Diagnosis not present

## 2022-02-03 ENCOUNTER — Encounter: Payer: Self-pay | Admitting: Internal Medicine

## 2022-02-03 DIAGNOSIS — N2889 Other specified disorders of kidney and ureter: Secondary | ICD-10-CM | POA: Insufficient documentation

## 2022-02-03 NOTE — Assessment & Plan Note (Signed)
He continues active surveillance with serial MRI's. most recent MRI dated 12/12/2021 that shows a stable 1.6 cm heterogenously enhancing small right renal mass, unchanged in size from prior imaging.

## 2022-02-03 NOTE — Assessment & Plan Note (Addendum)
Reviewed his Feb 2023 visit with Dr Gabriel Carina.  Goal TSH is met.   "As he is now nearly 9 years post-treatment, it is reasonable to keep TSH level in the 0.3 - 2.5 uIU/ml range. Hypothyroidism is mildly over treated. Will reduce levothyroxine to 137 mcg daily."   Lab Results  Component Value Date   TSH 0.29 (L) 01/26/2022

## 2022-02-03 NOTE — Assessment & Plan Note (Signed)
Given his A1c of 6.2,  His high fasting morning sugars are likely due to hypoglycemic events during sleep.  I have advised him to reduce the evening dose of glipizide to 5 mg  .  Continue metformin,  Losartan foe early microalbuminuria ,  And  atorvastatin.   for early microalbuninuria Lab Results  Component Value Date   HGBA1C 6.2 01/26/2022   Lab Results  Component Value Date   LABMICR 56.6 11/28/2020   MICROALBUR 2.6 (H) 01/31/2022   MICROALBUR 4.1 (H) 11/18/2019

## 2022-02-03 NOTE — Assessment & Plan Note (Addendum)
He IS OVERDUE for PSA and not due yet for follow up colonoscopy  Lab Results  Component Value Date   PSA 1.14 11/18/2019   PSA 1.35 12/12/2016   PSA 1.20 10/26/2015

## 2022-02-03 NOTE — Assessment & Plan Note (Signed)
.    FR is stable and his tolerating e ARB and SGLT2 inhibitor.  He has been referred to nephrology   Lab Results  Component Value Date   NA 136 01/26/2022   K 4.9 01/26/2022   CL 103 01/26/2022   CO2 25 01/26/2022   Lab Results  Component Value Date   CREATININE 1.86 (H) 01/26/2022

## 2022-02-19 DIAGNOSIS — Z85850 Personal history of malignant neoplasm of thyroid: Secondary | ICD-10-CM | POA: Diagnosis not present

## 2022-02-19 DIAGNOSIS — E890 Postprocedural hypothyroidism: Secondary | ICD-10-CM | POA: Diagnosis not present

## 2022-02-28 ENCOUNTER — Other Ambulatory Visit: Payer: Self-pay | Admitting: Internal Medicine

## 2022-03-06 ENCOUNTER — Telehealth: Payer: Self-pay

## 2022-03-06 NOTE — Telephone Encounter (Signed)
Patient states he needs a refill for his ONETOUCH ULTRA test strip.    *Patient states his preferred pharmacy is [facility] in [place].

## 2022-03-07 ENCOUNTER — Other Ambulatory Visit: Payer: Self-pay

## 2022-03-07 MED ORDER — ONETOUCH ULTRA VI STRP: ORAL_STRIP | 2 refills | Status: DC

## 2022-03-26 ENCOUNTER — Ambulatory Visit: Payer: PPO | Admitting: Internal Medicine

## 2022-03-28 DIAGNOSIS — K219 Gastro-esophageal reflux disease without esophagitis: Secondary | ICD-10-CM | POA: Diagnosis not present

## 2022-03-28 DIAGNOSIS — Z86010 Personal history of colonic polyps: Secondary | ICD-10-CM | POA: Diagnosis not present

## 2022-03-28 DIAGNOSIS — R053 Chronic cough: Secondary | ICD-10-CM | POA: Diagnosis not present

## 2022-05-05 ENCOUNTER — Other Ambulatory Visit: Payer: Self-pay | Admitting: Internal Medicine

## 2022-05-05 DIAGNOSIS — E1121 Type 2 diabetes mellitus with diabetic nephropathy: Secondary | ICD-10-CM

## 2022-05-10 ENCOUNTER — Ambulatory Visit (INDEPENDENT_AMBULATORY_CARE_PROVIDER_SITE_OTHER): Payer: PPO

## 2022-05-10 ENCOUNTER — Ambulatory Visit (INDEPENDENT_AMBULATORY_CARE_PROVIDER_SITE_OTHER): Payer: PPO | Admitting: Internal Medicine

## 2022-05-10 ENCOUNTER — Encounter: Payer: Self-pay | Admitting: Internal Medicine

## 2022-05-10 VITALS — BP 126/84 | HR 67 | Temp 97.8°F | Ht 69.0 in | Wt 169.2 lb

## 2022-05-10 DIAGNOSIS — E1169 Type 2 diabetes mellitus with other specified complication: Secondary | ICD-10-CM | POA: Diagnosis not present

## 2022-05-10 DIAGNOSIS — R051 Acute cough: Secondary | ICD-10-CM | POA: Diagnosis not present

## 2022-05-10 DIAGNOSIS — N183 Chronic kidney disease, stage 3 unspecified: Secondary | ICD-10-CM | POA: Diagnosis not present

## 2022-05-10 DIAGNOSIS — E1122 Type 2 diabetes mellitus with diabetic chronic kidney disease: Secondary | ICD-10-CM | POA: Diagnosis not present

## 2022-05-10 DIAGNOSIS — R059 Cough, unspecified: Secondary | ICD-10-CM | POA: Diagnosis not present

## 2022-05-10 DIAGNOSIS — E1140 Type 2 diabetes mellitus with diabetic neuropathy, unspecified: Secondary | ICD-10-CM

## 2022-05-10 DIAGNOSIS — J449 Chronic obstructive pulmonary disease, unspecified: Secondary | ICD-10-CM

## 2022-05-10 DIAGNOSIS — I700 Atherosclerosis of aorta: Secondary | ICD-10-CM | POA: Diagnosis not present

## 2022-05-10 DIAGNOSIS — E785 Hyperlipidemia, unspecified: Secondary | ICD-10-CM

## 2022-05-10 LAB — COMPREHENSIVE METABOLIC PANEL
ALT: 23 U/L (ref 0–53)
AST: 19 U/L (ref 0–37)
Albumin: 4 g/dL (ref 3.5–5.2)
Alkaline Phosphatase: 115 U/L (ref 39–117)
BUN: 33 mg/dL — ABNORMAL HIGH (ref 6–23)
CO2: 27 mEq/L (ref 19–32)
Calcium: 9.2 mg/dL (ref 8.4–10.5)
Chloride: 105 mEq/L (ref 96–112)
Creatinine, Ser: 2.1 mg/dL — ABNORMAL HIGH (ref 0.40–1.50)
GFR: 30.45 mL/min — ABNORMAL LOW (ref >60.00)
Glucose, Bld: 120 mg/dL — ABNORMAL HIGH (ref 70–99)
Potassium: 5.3 mEq/L — ABNORMAL HIGH (ref 3.5–5.1)
Sodium: 136 mEq/L (ref 135–145)
Total Bilirubin: 0.4 mg/dL (ref 0.2–1.2)
Total Protein: 7 g/dL (ref 6.0–8.3)

## 2022-05-10 LAB — LDL CHOLESTEROL, DIRECT: Direct LDL: 85 mg/dL

## 2022-05-10 LAB — POCT GLYCOSYLATED HEMOGLOBIN (HGB A1C): Hemoglobin A1C: 6.2 % — AB (ref 4.0–5.6)

## 2022-05-10 MED ORDER — RSVPREF3 VAC RECOMB ADJUVANTED 120 MCG/0.5ML IM SUSR: 0.5000 mL | Freq: Once | INTRAMUSCULAR | 0 refills | Status: AC

## 2022-05-10 NOTE — Assessment & Plan Note (Signed)
Reviewed findings of prior CT scan today..  Patient is tolerating high potency statin therapy  

## 2022-05-10 NOTE — Progress Notes (Signed)
Subjective:  [name], male    DOB: 04-26-1946  Age: 76 y.o. MRN: [id number]  CC: The primary encounter diagnosis was Hyperlipidemia associated with type 2 diabetes mellitus ([place]). Diagnoses of Type 2 diabetes mellitus with diabetic neuropathy, without long-term current use of insulin ([facility]), Acute cough, COPD, mild ([place]), Aortic atherosclerosis ([place]), and CKD stage 3 due to type 2 diabetes mellitus ([place]) were also pertinent to this visit.   HPI Nathaniel Lane presents for   follow up on type 2 diabetes. Hyperliopidemia and hypertension  Chief Complaint  Patient presents with   Follow-up    38mth   1) type 2 DM:  did not bring BS log (lost it on the way) : fastings have been between 90 and 130 . Does not check post prandial sugars.  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  Does not walk for exercise or do any other exercise. Taking Farxiga and glipizide and metformin.  No diarrhea.  Feet    2) HTN:  checks once daily.  Readings have been  90-130/60 to 90.  Has occasional symptoms of orthostasis while walking ,  improves with rest and hydration .     3) cough:  he reports a tickle in his throat and has coughed several times during visit,  denies fevers,  dyspnea   Outpatient Medications Prior to Visit  Medication Sig Dispense Refill   aspirin EC (ASPIRIN LOW DOSE) 81 MG tablet TAKE 1 TABLET (81 MG TOTAL) BY MOUTH DAILY. SWALLOW WHOLE. 90 tablet 3   atorvastatin (LIPITOR) 40 MG tablet TAKE 1 TABLET BY MOUTH EVERY DAY 90 tablet 1   blood glucose meter kit and supplies KIT Dispense based on patient and insurance preference. Use up to four times daily as directed. (FOR ICD-10 E11.29). 1 each 0   FARXIGA 10 MG TABS tablet Take 10 mg by mouth daily.     gabapentin (NEURONTIN) 300 MG capsule TAKE 1 CAPSULE BY MOUTH 4 TIMES DAILY. 360 capsule 2   glipiZIDE (GLUCOTROL) 5 MG tablet TAKE 1 TABLET (5 MG TOTAL) BY MOUTH DAILY BEFORE SUPPER. 90 tablet 1    glucose blood (ONETOUCH ULTRA) test strip USE AS DIRECTED 4 TIMES A DAY 100 strip 2   levothyroxine (SYNTHROID) 137 MCG tablet Take 137 mcg by mouth daily before breakfast.     losartan (COZAAR) 50 MG tablet TAKE 1 TABLET BY MOUTH EVERY DAY 90 tablet 3   metFORMIN (GLUCOPHAGE-XR) 750 MG 24 hr tablet TAKE 1 TABLET BY MOUTH EVERY DAY WITH BREAKFAST 90 tablet 1   omeprazole (PRILOSEC) 20 MG capsule Take 20 mg by mouth daily.     ONETOUCH DELICA LANCETS FINE MISC USE AS DIRECTED 4 TIMES A DAY 100 each 2   tamsulosin (FLOMAX) 0.4 MG CAPS capsule TAKE 1 CAPSULE BY MOUTH EVERY DAY 90 capsule 1   No facility-administered medications prior to visit.    Review of Systems;  Patient denies headache, fevers, malaise, unintentional weight loss, skin rash, eye pain, sinus congestion and sinus pain, sore throat, dysphagia,  hemoptysis , cough, dyspnea, wheezing, chest pain, palpitations, orthopnea, edema, abdominal pain, nausea, melena, diarrhea, constipation, flank pain, dysuria, hematuria, urinary  Frequency, nocturia, numbness, tingling, seizures,  Focal weakness, Loss of consciousness,  Tremor, insomnia, depression, anxiety, and suicidal ideation.      Objective:  BP 126/84 (BP Location: Left Arm, Patient Position: Sitting, Cuff Size: Normal)   Pulse 67   Temp 97.8  F (36.6 C) (Oral)   Ht _0  (1.753 m)   Wt 169 lb 3.2 oz (76.7 kg)   SpO2 96%   BMI 24.99 kg/m   BP Readings from Last 3 Encounters:  05/10/22 126/84  01/31/22 128/70  12/19/21 (!) 83/52    Wt Readings from Last 3 Encounters:  05/10/22 169 lb 3.2 oz (76.7 kg)  01/31/22 170 lb 3.2 oz (77.2 kg)  12/19/21 168 lb (76.2 kg)    General appearance: alert, cooperative and appears stated age Ears: normal TM's and external ear canals both ears Throat: lips, mucosa, and tongue normal; teeth and gums normal Neck: no adenopathy, no carotid bruit, supple, symmetrical, trachea midline and thyroid not enlarged, symmetric, no  tenderness/mass/nodules Back: symmetric, no curvature. ROM normal. No CVA tenderness. Lungs: clear to auscultation bilaterally Heart: regular rate and rhythm, S1, S2 normal, no murmur, click, rub or gallop Abdomen: soft, non-tender; bowel sounds normal; no masses,  no organomegaly Pulses: 2+ and symmetric Skin: Skin color, texture, turgor normal. No rashes or lesions Lymph nodes: Cervical, supraclavicular, and axillary nodes normal. Neuro:  awake and interactive with normal mood and affect. Higher cortical functions are normal. Speech is clear without word-finding difficulty or dysarthria. Extraocular movements are intact. Visual fields of both eyes are grossly intact. Sensation to light touch is grossly intact bilaterally of upper and lower extremities. Motor examination shows 4+/5 symmetric hand grip and upper extremity and 5/5 lower extremity strength. There is no pronation or drift. Gait is non-ataxic   Lab Results  Component Value Date   HGBA1C 6.2 (A) 05/10/2022   HGBA1C 6.2 01/26/2022   HGBA1C 7.8 (H) 09/21/2021    Lab Results  Component Value Date   CREATININE 2.10 (H) 05/10/2022   CREATININE 1.86 (H) 01/26/2022   CREATININE 1.97 (H) 09/21/2021    Lab Results  Component Value Date   WBC 8.5 09/21/2021   HGB 15.1 09/21/2021   HCT 45.5 09/21/2021   PLT 149.0 (L) 09/21/2021   GLUCOSE 120 (H) 05/10/2022   CHOL 132 01/26/2022   TRIG 189.0 (H) 01/26/2022   HDL 30.10 (L) 01/26/2022   LDLDIRECT 85.0 05/10/2022   LDLCALC 64 01/26/2022   ALT 23 05/10/2022   AST 19 05/10/2022   NA 136 05/10/2022   K 5.3 No hemolysis seen (H) 05/10/2022   CL 105 05/10/2022   CREATININE 2.10 (H) 05/10/2022   BUN 33 (H) 05/10/2022   CO2 27 05/10/2022   TSH 0.29 (L) 01/26/2022   PSA 1.14 11/18/2019   INR 1.2 09/27/2011   HGBA1C 6.2 (A) 05/10/2022   MICROALBUR 2.6 (H) 01/31/2022    MR ABDOMEN WWO CONTRAST  Result Date: 12/12/2021 CLINICAL DATA:  Right renal mass follow-up EXAM: MRI  ABDOMEN WITHOUT AND WITH CONTRAST TECHNIQUE: Multiplanar multisequence MR imaging of the abdomen was performed both before and after the administration of intravenous contrast. CONTRAST:  7.83m GADAVIST GADOBUTROL 1 MMOL/ML IV SOLN COMPARISON:  MRI abdomen 06/06/2021 FINDINGS: Lower chest: Grossly stable appearing chronic loculated pleural effusion at the right lung base. Hepatobiliary: Liver is normal in size in contour with no suspicious mass identified. 1.2 cm hemangioma in the central right hepatic lobe. Small amount of dependent cholelithiasis. No gallbladder wall thickening or biliary ductal dilatation. Pancreas: No mass, inflammatory changes, or other parenchymal abnormality identified. Spleen:  Within normal limits in size and appearance. Adrenals/Urinary Tract: Stable left adrenal gland nodule consistent with adenoma. Heterogeneously enhancing mass at the lateral aspect of the right kidney again seen measuring  1.6 cm in size, unchanged. There is also a 1 cm hemorrhagic cyst in the lower pole right kidney. No hydronephrosis. Stomach/Bowel: Colonic diverticulosis. No evidence of bowel obstruction. Vascular/Lymphatic: Renal veins are within normal limits. No bulky lymphadenopathy identified. Other:  No ascites. Musculoskeletal: No suspicious bony lesions. IMPRESSION: 1. Stable enhancing mass in the lateral right kidney measuring 1.6 cm, most consistent with renal cell carcinoma. No evidence of metastatic disease. 2. Hemorrhagic cyst in the right kidney. 3. Colonic diverticulosis. 4. Other chronic findings as described. Electronically Signed   By: Ofilia Neas M.D.   On: 12/12/2021 17:11    Assessment & Plan:   Problem List Items Addressed This Visit     Acute cough    He denies fever and shortness of breath, but has a history of lung CA and has not had any imaging since 2019,  history of right lobectomy,  plain films ordered .        Relevant Orders   DG Chest 2 View   Aortic atherosclerosis  [facility])    Reviewed findings of prior CT scan today..  Patient is tolerating high potency statin therapy       CKD stage 3 due to type 2 diabetes mellitus ([facility])    Avoidance of NSAIDs reviewed.  Advised to Continue semi annual follow up  with his nephrologist.       COPD, mild ([place])    No wheezing on exam today despite a noticeable cough.  Chest x ray ordered and RSV vaccine recimmended       Hyperlipidemia associated with type 2 diabetes mellitus ([place]) - Primary   Relevant Orders   POCT HgB A1C (Completed)   LDL cholesterol, direct (Completed)   Type 2 diabetes mellitus with diabetic neuropathy, unspecified ([place])    Managed with farxia, metformin and glipizide,  neuropathy is Managed with 1200 mg gabapentin daily in divided dose. Burning pain is intermittent and seems to be aggravated by elevated sugars and extra starch servings .  No changes today        Relevant Orders   Comprehensive metabolic panel (Completed)    I spent a total of  30  minutes with this patient in a face to face visit on the date of this encounter reviewing the last office visit with me , his  most recent visit with nephrology,  patient's diet and exercise habits, home blood pressure /blod sugar readings, recent ER visit including labs and imaging studies ,   and post visit ordering of testing and therapeutics.    Follow-up: No follow-ups on file.   Crecencio Mc, MD

## 2022-05-10 NOTE — Assessment & Plan Note (Signed)
He denies fever and shortness of breath, but has a history of lung CA and has not had any imaging since 2019,  history of right lobectomy,  plain films ordered .

## 2022-05-10 NOTE — Patient Instructions (Addendum)
Your diabetes remains under excellent control  and so is your blood pressure!  Please return in 6 months for follow up on diabetes and make sure you are seeing your eye doctor   at least once a year.     I do recommend the RSV vaccine for you, .  It is now available through the office If you are younger than 99. You can also check with [facility], [facility] and [facility]

## 2022-05-10 NOTE — Assessment & Plan Note (Signed)
Avoidance of NSAIDs reviewed.  Advised to Continue semi annual follow up  with his nephrologist.

## 2022-05-10 NOTE — Assessment & Plan Note (Addendum)
Managed with farxia, metformin and glipizide,  neuropathy is Managed with 1200 mg gabapentin daily in divided dose. Burning pain is intermittent and seems to be aggravated by elevated sugars and extra starch servings .  No changes today

## 2022-05-10 NOTE — Assessment & Plan Note (Signed)
No wheezing on exam today despite a noticeable cough.  Chest x ray ordered and RSV vaccine recimmended

## 2022-05-25 ENCOUNTER — Other Ambulatory Visit
Admission: RE | Admit: 2022-05-25 | Discharge: 2022-05-25 | Disposition: A | Discharge status: Home / Self Care | Payer: PPO | Attending: Internal Medicine | Admitting: Internal Medicine

## 2022-05-25 DIAGNOSIS — R8290 Unspecified abnormal findings in urine: Secondary | ICD-10-CM | POA: Diagnosis not present

## 2022-05-25 DIAGNOSIS — N2889 Other specified disorders of kidney and ureter: Secondary | ICD-10-CM | POA: Insufficient documentation

## 2022-05-25 DIAGNOSIS — D3502 Benign neoplasm of left adrenal gland: Secondary | ICD-10-CM | POA: Insufficient documentation

## 2022-05-25 DIAGNOSIS — I129 Hypertensive chronic kidney disease with stage 1 through stage 4 chronic kidney disease, or unspecified chronic kidney disease: Secondary | ICD-10-CM | POA: Diagnosis not present

## 2022-05-25 DIAGNOSIS — E1122 Type 2 diabetes mellitus with diabetic chronic kidney disease: Secondary | ICD-10-CM | POA: Insufficient documentation

## 2022-05-25 DIAGNOSIS — N1832 Chronic kidney disease, stage 3b: Secondary | ICD-10-CM | POA: Diagnosis not present

## 2022-05-25 DIAGNOSIS — R809 Proteinuria, unspecified: Secondary | ICD-10-CM | POA: Diagnosis not present

## 2022-05-25 LAB — CBC WITH DIFFERENTIAL/PLATELET
Abs Immature Granulocytes: 0.05 10*3/uL (ref 0.00–0.07)
Basophils Absolute: 0.1 10*3/uL (ref 0.0–0.1)
Basophils Relative: 1 %
Eosinophils Absolute: 0.3 10*3/uL (ref 0.0–0.5)
Eosinophils Relative: 3 %
HCT: 48 % (ref 39.0–52.0)
Hemoglobin: 16 g/dL (ref 13.0–17.0)
Immature Granulocytes: 1 %
Lymphocytes Relative: 23 %
Lymphs Abs: 1.9 10*3/uL (ref 0.7–4.0)
MCH: 29 pg (ref 26.0–34.0)
MCHC: 33.3 g/dL (ref 30.0–36.0)
MCV: 87.1 fL (ref 80.0–100.0)
Monocytes Absolute: 0.7 10*3/uL (ref 0.1–1.0)
Monocytes Relative: 9 %
Neutro Abs: 5.4 10*3/uL (ref 1.7–7.7)
Neutrophils Relative %: 63 %
Platelets: 195 10*3/uL (ref 150–400)
RBC: 5.51 MIL/uL (ref 4.22–5.81)
RDW: 13.5 % (ref 11.5–15.5)
WBC: 8.4 10*3/uL (ref 4.0–10.5)
nRBC: 0 % (ref 0.0–0.2)

## 2022-05-25 LAB — RENAL FUNCTION PANEL
Albumin: 3.9 g/dL (ref 3.5–5.0)
Anion gap: 7 (ref 5–15)
BUN: 36 mg/dL — ABNORMAL HIGH (ref 8–23)
CO2: 25 mmol/L (ref 22–32)
Calcium: 9.4 mg/dL (ref 8.9–10.3)
Chloride: 106 mmol/L (ref 98–111)
Creatinine, Ser: 2.11 mg/dL — ABNORMAL HIGH (ref 0.61–1.24)
GFR, Estimated: 32 mL/min — ABNORMAL LOW (ref >60)
Glucose, Bld: 110 mg/dL — ABNORMAL HIGH (ref 70–99)
Phosphorus: 3.9 mg/dL (ref 2.5–4.6)
Potassium: 4.8 mmol/L (ref 3.5–5.1)
Sodium: 138 mmol/L (ref 135–145)

## 2022-05-25 LAB — URINALYSIS, COMPLETE (UACMP) WITH MICROSCOPIC
Bacteria, UA: NONE SEEN
Bilirubin Urine: NEGATIVE
Glucose, UA: >=500 mg/dL — AB
Ketones, ur: NEGATIVE mg/dL
Leukocytes,Ua: NEGATIVE
Nitrite: NEGATIVE
Protein, ur: 30 mg/dL — AB
Specific Gravity, Urine: 1.011 (ref 1.005–1.030)
Squamous Epithelial / HPF: NONE SEEN (ref 0–5)
pH: 5 (ref 5.0–8.0)

## 2022-05-25 LAB — PROTEIN / CREATININE RATIO, URINE
Creatinine, Urine: 79 mg/dL
Protein Creatinine Ratio: 0.34 mg/mg{Cre} — ABNORMAL HIGH (ref 0.00–0.15)
Total Protein, Urine: 27 mg/dL

## 2022-05-27 LAB — PTH, INTACT AND CALCIUM
Calcium, Total (PTH): 9.2 mg/dL (ref 8.6–10.2)
PTH: 82 pg/mL — ABNORMAL HIGH (ref 15–65)

## 2022-06-01 ENCOUNTER — Other Ambulatory Visit: Payer: Self-pay | Admitting: Internal Medicine

## 2022-06-01 NOTE — Telephone Encounter (Signed)
Refilled: historical medication Last OV: 05/10/2022 Next OV: 11/09/2022

## 2022-06-04 DIAGNOSIS — N1832 Chronic kidney disease, stage 3b: Secondary | ICD-10-CM | POA: Diagnosis not present

## 2022-06-04 DIAGNOSIS — D3502 Benign neoplasm of left adrenal gland: Secondary | ICD-10-CM | POA: Diagnosis not present

## 2022-06-04 DIAGNOSIS — E1122 Type 2 diabetes mellitus with diabetic chronic kidney disease: Secondary | ICD-10-CM | POA: Diagnosis not present

## 2022-06-04 DIAGNOSIS — R809 Proteinuria, unspecified: Secondary | ICD-10-CM | POA: Diagnosis not present

## 2022-06-04 DIAGNOSIS — N2581 Secondary hyperparathyroidism of renal origin: Secondary | ICD-10-CM | POA: Diagnosis not present

## 2022-06-04 DIAGNOSIS — I10 Essential (primary) hypertension: Secondary | ICD-10-CM | POA: Diagnosis not present

## 2022-06-28 ENCOUNTER — Encounter: Payer: Self-pay | Admitting: Gastroenterology

## 2022-06-28 NOTE — H&P (Signed)
Pre-Procedure H&P   [name] is a 77 y.o. male.  Gastroenterology Provider: Annamaria Helling, DO  Referring Provider: Laurine Blazer, PA PCP: Crecencio Mc, MD  Date: 06/29/2022  HPI [name] is a 77 y.o. male who presents today for Colonoscopy for Surveillance-personal history of colon cancer and colon polyps .  Patient had adenocarcinoma within a rectal polyp in 2003.  He has had 2 follow-up colonoscopies in 2012 and 2017 each revealing adenomatous polyps.  His most recent colonoscopy 2017 demonstrated 3 tubular adenomas left-sided diverticulosis as well as internal and external hemorrhoids.  Currently has daily bowel movements without melena hematochezia diarrhea or constipation  History of lung cancer status post thoracotomy and lower lobectomy in addition to chemotherapy in 2013.  Underwent CABG in 2007.  He has a history of multifocal papillary thyroid cancer status post ablation and thyroidectomy in 2021.  Most recent lab work creatinine 2.1 hemoglobin 15 MCV 88 platelets 149,000.  Father with crc  Past Medical History:  Diagnosis Date   Cancer [facility]) 2012   Stage IIA squamous cell Lung Ca   CHF (congestive heart failure) ([place]) 01/28/2017   Diabetes mellitus    Gout    Hyperlipidemia    Hypertension    Myocardial infarction [facility])    Thyroid cancer ([place]) 2013   s/p thyroidectomy 2014    Past Surgical History:  Procedure Laterality Date   COLONOSCOPY N/A 04/25/2016   Procedure: COLONOSCOPY;  Surgeon: Manya Silvas, MD;  Location: [place];  Service: Endoscopy;  Laterality: N/A;  Diabetic (oral meds)   COLONOSCOPY WITH PROPOFOL N/A 03/22/2021   Procedure: COLONOSCOPY WITH PROPOFOL;  Surgeon: Lin Landsman, MD;  Location: [facility] ENDOSCOPY;  Service: Gastroenterology;  Laterality: N/A;   CORONARY ARTERY BYPASS GRAFT  2007   LUNG LOBECTOMY     TOTAL THYROIDECTOMY N/A     Family History Father- crc No h/o GI disease or  malignancy  Review of Systems  Constitutional:  Negative for activity change, appetite change, chills, diaphoresis, fatigue, fever and unexpected weight change.  HENT:  Negative for trouble swallowing and voice change.   Respiratory:  Negative for shortness of breath and wheezing.   Cardiovascular:  Negative for chest pain, palpitations and leg swelling.  Gastrointestinal:  Negative for abdominal distention, abdominal pain, anal bleeding, blood in stool, constipation, diarrhea, nausea and vomiting.  Musculoskeletal:  Negative for arthralgias and myalgias.  Skin:  Negative for color change and pallor.  Neurological:  Negative for dizziness, syncope and weakness.  Psychiatric/Behavioral:  Negative for confusion. The patient is not nervous/anxious.   All other systems reviewed and are negative.    Medications No current facility-administered medications on file prior to encounter.   Current Outpatient Medications on File Prior to Encounter  Medication Sig Dispense Refill   aspirin EC (ASPIRIN LOW DOSE) 81 MG tablet TAKE 1 TABLET (81 MG TOTAL) BY MOUTH DAILY. SWALLOW WHOLE. 90 tablet 3   atorvastatin (LIPITOR) 40 MG tablet TAKE 1 TABLET BY MOUTH EVERY DAY 90 tablet 1   blood glucose meter kit and supplies KIT Dispense based on patient and insurance preference. Use up to four times daily as directed. (FOR ICD-10 E11.29). 1 each 0   gabapentin (NEURONTIN) 300 MG capsule TAKE 1 CAPSULE BY MOUTH 4 TIMES DAILY. 360 capsule 2   glucose blood (ONETOUCH ULTRA) test strip USE AS DIRECTED 4 TIMES A DAY 100 strip 2   levothyroxine (SYNTHROID) 137 MCG tablet Take  137 mcg by mouth daily before breakfast.     losartan (COZAAR) 50 MG tablet TAKE 1 TABLET BY MOUTH EVERY DAY 90 tablet 3   metFORMIN (GLUCOPHAGE-XR) 750 MG 24 hr tablet TAKE 1 TABLET BY MOUTH EVERY DAY WITH BREAKFAST 90 tablet 1   omeprazole (PRILOSEC) 20 MG capsule Take 20 mg by mouth daily.     ONETOUCH DELICA LANCETS FINE MISC USE AS  DIRECTED 4 TIMES A DAY 100 each 2   tamsulosin (FLOMAX) 0.4 MG CAPS capsule TAKE 1 CAPSULE BY MOUTH EVERY DAY 90 capsule 1    Pertinent medications related to GI and procedure were reviewed by me with the patient prior to the procedure   Current Facility-Administered Medications:    0.9 %  sodium chloride infusion, , Intravenous, Continuous, Annamaria Helling, DO      Allergies  Allergen Reactions   Penicillins Swelling    Swelling in mouth Swelling in mouth Swelling in mouth   Allergies were reviewed by me prior to the procedure  Objective   Body mass index is 22.9 kg/m. Vitals:   06/29/22 0941  BP: 123/84  Pulse: (!) 59  Temp: (!) 96.3 F (35.7 C)  TempSrc: Temporal  SpO2: 99%  Weight: 74.5 kg  Height: _0  (1.803 m)     Physical Exam Vitals and nursing note reviewed.  Constitutional:      General: He is not in acute distress.    Appearance: Normal appearance. He is not ill-appearing, toxic-appearing or diaphoretic.  HENT:     Head: Normocephalic and atraumatic.     Nose: Nose normal.     Mouth/Throat:     Mouth: Mucous membranes are moist.     Pharynx: Oropharynx is clear.  Eyes:     General: No scleral icterus.    Extraocular Movements: Extraocular movements intact.  Cardiovascular:     Rate and Rhythm: Regular rhythm. Bradycardia present.     Heart sounds: Normal heart sounds. No murmur heard.    No friction rub. No gallop.  Pulmonary:     Effort: Pulmonary effort is normal. No respiratory distress.     Breath sounds: Normal breath sounds. No wheezing, rhonchi or rales.  Abdominal:     General: Bowel sounds are normal. There is no distension.     Palpations: Abdomen is soft.     Tenderness: There is no abdominal tenderness. There is no guarding or rebound.  Musculoskeletal:     Cervical back: Neck supple.     Right lower leg: No edema.     Left lower leg: No edema.     Comments: Right sided chest port  Skin:    General: Skin is warm and  dry.     Coloration: Skin is not jaundiced or pale.  Neurological:     General: No focal deficit present.     Mental Status: He is alert and oriented to person, place, and time. Mental status is at baseline.  Psychiatric:        Mood and Affect: Mood normal.        Behavior: Behavior normal.        Thought Content: Thought content normal.        Judgment: Judgment normal.      Assessment:  [name] is a 77 y.o. male  who presents today for Colonoscopy for Surveillance-personal history of colon cancer and colon polyps .  Plan:  Colonoscopy with possible intervention today  Colonoscopy with possible biopsy, control  of bleeding, polypectomy, and interventions as necessary has been discussed with the patient/patient representative. Informed consent was obtained from the patient/patient representative after explaining the indication, nature, and risks of the procedure including but not limited to death, bleeding, perforation, missed neoplasm/lesions, cardiorespiratory compromise, and reaction to medications. Opportunity for questions was given and appropriate answers were provided. Patient/patient representative has verbalized understanding is amenable to undergoing the procedure.   Annamaria Helling, DO  [facility] Gastroenterology  Portions of the record may have been created with voice recognition software. Occasional wrong-word or 'sound-a-like' substitutions may have occurred due to the inherent limitations of voice recognition software.  Read the chart carefully and recognize, using context, where substitutions may have occurred.

## 2022-06-29 ENCOUNTER — Encounter: Payer: Self-pay | Admitting: Gastroenterology

## 2022-06-29 ENCOUNTER — Encounter
Admission: RE | Disposition: A | Discharge status: Home / Self Care | Payer: Self-pay | Source: Home / Self Care | Attending: Gastroenterology

## 2022-06-29 ENCOUNTER — Ambulatory Visit: Payer: PPO | Admitting: Anesthesiology

## 2022-06-29 ENCOUNTER — Ambulatory Visit
Admission: RE | Admit: 2022-06-29 | Discharge: 2022-06-29 | Disposition: A | Discharge status: Home / Self Care | Payer: PPO | Attending: Gastroenterology | Admitting: Gastroenterology

## 2022-06-29 DIAGNOSIS — D122 Benign neoplasm of ascending colon: Secondary | ICD-10-CM | POA: Diagnosis not present

## 2022-06-29 DIAGNOSIS — K5790 Diverticulosis of intestine, part unspecified, without perforation or abscess without bleeding: Secondary | ICD-10-CM | POA: Diagnosis not present

## 2022-06-29 DIAGNOSIS — Z1211 Encounter for screening for malignant neoplasm of colon: Secondary | ICD-10-CM | POA: Diagnosis not present

## 2022-06-29 DIAGNOSIS — Z85038 Personal history of other malignant neoplasm of large intestine: Secondary | ICD-10-CM | POA: Insufficient documentation

## 2022-06-29 DIAGNOSIS — Z951 Presence of aortocoronary bypass graft: Secondary | ICD-10-CM | POA: Insufficient documentation

## 2022-06-29 DIAGNOSIS — I110 Hypertensive heart disease with heart failure: Secondary | ICD-10-CM | POA: Insufficient documentation

## 2022-06-29 DIAGNOSIS — E890 Postprocedural hypothyroidism: Secondary | ICD-10-CM | POA: Insufficient documentation

## 2022-06-29 DIAGNOSIS — Z902 Acquired absence of lung [part of]: Secondary | ICD-10-CM | POA: Diagnosis not present

## 2022-06-29 DIAGNOSIS — Z9221 Personal history of antineoplastic chemotherapy: Secondary | ICD-10-CM | POA: Insufficient documentation

## 2022-06-29 DIAGNOSIS — K5730 Diverticulosis of large intestine without perforation or abscess without bleeding: Secondary | ICD-10-CM | POA: Insufficient documentation

## 2022-06-29 DIAGNOSIS — Z87891 Personal history of nicotine dependence: Secondary | ICD-10-CM | POA: Diagnosis not present

## 2022-06-29 DIAGNOSIS — Z85850 Personal history of malignant neoplasm of thyroid: Secondary | ICD-10-CM | POA: Insufficient documentation

## 2022-06-29 DIAGNOSIS — E119 Type 2 diabetes mellitus without complications: Secondary | ICD-10-CM | POA: Insufficient documentation

## 2022-06-29 DIAGNOSIS — K641 Second degree hemorrhoids: Secondary | ICD-10-CM | POA: Diagnosis not present

## 2022-06-29 DIAGNOSIS — I252 Old myocardial infarction: Secondary | ICD-10-CM | POA: Insufficient documentation

## 2022-06-29 DIAGNOSIS — D120 Benign neoplasm of cecum: Secondary | ICD-10-CM | POA: Diagnosis not present

## 2022-06-29 DIAGNOSIS — I509 Heart failure, unspecified: Secondary | ICD-10-CM | POA: Insufficient documentation

## 2022-06-29 DIAGNOSIS — K635 Polyp of colon: Secondary | ICD-10-CM | POA: Diagnosis not present

## 2022-06-29 DIAGNOSIS — K640 First degree hemorrhoids: Secondary | ICD-10-CM | POA: Diagnosis not present

## 2022-06-29 DIAGNOSIS — Z85118 Personal history of other malignant neoplasm of bronchus and lung: Secondary | ICD-10-CM | POA: Diagnosis not present

## 2022-06-29 DIAGNOSIS — Z86010 Personal history of colonic polyps: Secondary | ICD-10-CM | POA: Diagnosis not present

## 2022-06-29 DIAGNOSIS — I2510 Atherosclerotic heart disease of native coronary artery without angina pectoris: Secondary | ICD-10-CM | POA: Insufficient documentation

## 2022-06-29 DIAGNOSIS — D124 Benign neoplasm of descending colon: Secondary | ICD-10-CM | POA: Diagnosis not present

## 2022-06-29 HISTORY — PX: COLONOSCOPY WITH PROPOFOL: SHX5780

## 2022-06-29 LAB — GLUCOSE, CAPILLARY: Glucose-Capillary: 119 mg/dL — ABNORMAL HIGH (ref 70–99)

## 2022-06-29 SURGERY — COLONOSCOPY WITH PROPOFOL
Anesthesia: General

## 2022-06-29 MED ORDER — PHENYLEPHRINE HCL (PRESSORS) 10 MG/ML IV SOLN
INTRAVENOUS | Status: DC | PRN
  Administered 2022-06-29 (×2): 80 ug via INTRAVENOUS
  Administered 2022-06-29 (×2): 160 ug via INTRAVENOUS
  Administered 2022-06-29: 80 ug via INTRAVENOUS

## 2022-06-29 MED ORDER — GLYCOPYRROLATE 0.2 MG/ML IJ SOLN
INTRAMUSCULAR | Status: DC | PRN
  Administered 2022-06-29: .2 mg via INTRAVENOUS

## 2022-06-29 MED ORDER — SIMETHICONE 40 MG/0.6ML PO SUSP
ORAL | Status: DC | PRN
  Administered 2022-06-29: 120 mL

## 2022-06-29 MED ORDER — LIDOCAINE HCL (CARDIAC) PF 100 MG/5ML IV SOSY
PREFILLED_SYRINGE | INTRAVENOUS | Status: DC | PRN
  Administered 2022-06-29: 50 mg via INTRAVENOUS

## 2022-06-29 MED ORDER — PROPOFOL 500 MG/50ML IV EMUL
INTRAVENOUS | Status: DC | PRN
  Administered 2022-06-29: 160 ug/kg/min via INTRAVENOUS

## 2022-06-29 MED ORDER — SODIUM CHLORIDE 0.9 % IV SOLN
INTRAVENOUS | Status: DC
  Administered 2022-06-29: 20 mL/h via INTRAVENOUS

## 2022-06-29 MED ORDER — PROPOFOL 10 MG/ML IV BOLUS
INTRAVENOUS | Status: DC | PRN
  Administered 2022-06-29 (×2): 30 mg via INTRAVENOUS
  Administered 2022-06-29: 20 mg via INTRAVENOUS
  Administered 2022-06-29 (×3): 30 mg via INTRAVENOUS
  Administered 2022-06-29: 20 mg via INTRAVENOUS

## 2022-06-29 NOTE — Anesthesia Preprocedure Evaluation (Signed)
Anesthesia Evaluation  Patient identified by MRN, date of birth, ID band Patient awake    Reviewed: Allergy & Precautions, NPO status , Patient's Chart, lab work & pertinent test results  History of Anesthesia Complications Negative for: history of anesthetic complications  Airway Mallampati: III  TM Distance: <3 FB Neck ROM: full    Dental  (+) Chipped, Poor Dentition, Missing   Pulmonary shortness of breath and with exertion, COPD, former smoker   Pulmonary exam normal        Cardiovascular Exercise Tolerance: Good hypertension, (-) angina + CAD, + Past MI and +CHF  Normal cardiovascular exam     Neuro/Psych   Anxiety     negative neurological ROS  negative psych ROS   GI/Hepatic negative GI ROS, Neg liver ROS,neg GERD  ,,  Endo/Other  diabetes, Type 2Hypothyroidism    Renal/GU Renal disease  negative genitourinary   Musculoskeletal   Abdominal   Peds  Hematology negative hematology ROS (+)   Anesthesia Other Findings Past Medical History: 2012: Cancer ([place])     Comment:  Stage IIA squamous cell Lung Ca 01/28/2017: CHF (congestive heart failure) ([place]) No date: Diabetes mellitus No date: Gout No date: Hyperlipidemia No date: Hypertension No date: Myocardial infarction [facility]) 2013: Thyroid cancer ([place])     Comment:  s/p thyroidectomy 2014  Past Surgical History: 04/25/2016: COLONOSCOPY; N/A     Comment:  Procedure: COLONOSCOPY;  Surgeon: Manya Silvas, MD;               Location: [facility] ENDOSCOPY;  Service: Endoscopy;                Laterality: N/A;  Diabetic (oral meds) 03/22/2021: COLONOSCOPY WITH PROPOFOL; N/A     Comment:  Procedure: COLONOSCOPY WITH PROPOFOL;  Surgeon: Lin Landsman, MD;  Location: [facility] ENDOSCOPY;  Service:               Gastroenterology;  Laterality: N/A; 2007: CORONARY ARTERY BYPASS GRAFT No date: LUNG LOBECTOMY No date: TOTAL THYROIDECTOMY; N/A  BMI    Body  Mass Index: 22.90 kg/m      Reproductive/Obstetrics negative OB ROS                             Anesthesia Physical Anesthesia Plan  ASA: 3  Anesthesia Plan: General   Post-op Pain Management:    Induction: Intravenous  PONV Risk Score and Plan: Propofol infusion and TIVA  Airway Management Planned: Natural Airway and Nasal Cannula  Additional Equipment:   Intra-op Plan:   Post-operative Plan:   Informed Consent: I have reviewed the patients History and Physical, chart, labs and discussed the procedure including the risks, benefits and alternatives for the proposed anesthesia with the patient or authorized representative who has indicated his/her understanding and acceptance.     Dental Advisory Given  Plan Discussed with: Anesthesiologist, CRNA and Surgeon  Anesthesia Plan Comments: (Patient consented for risks of anesthesia including but not limited to:  - adverse reactions to medications - risk of airway placement if required - damage to eyes, teeth, lips or other oral mucosa - nerve damage due to positioning  - sore throat or hoarseness - Damage to heart, brain, nerves, lungs, other parts of body or loss of life  Patient voiced understanding.)       Anesthesia Quick Evaluation

## 2022-06-29 NOTE — Transfer of Care (Signed)
Immediate Anesthesia Transfer of Care Note  Patient: Nathaniel Lane  Procedure(s) Performed: COLONOSCOPY WITH PROPOFOL  Patient Location: PACU  Anesthesia Type:General  Level of Consciousness: awake, alert , and oriented  Airway & Oxygen Therapy: Patient Spontanous Breathing  Post-op Assessment: Report given to RN and Post -op Vital signs reviewed and stable  Post vital signs: Reviewed and stable  Last Vitals:  Vitals Value Taken Time  BP 102/72 06/29/22 1051  Temp 35.8 C 06/29/22 1047  Pulse 71 06/29/22 1051  Resp 17 06/29/22 1051  SpO2 100 % 06/29/22 1051  Vitals shown include unvalidated device data.  Last Pain:  Vitals:   06/29/22 1047  TempSrc: Temporal  PainSc: 0-No pain         Complications: No notable events documented.

## 2022-06-29 NOTE — Interval H&P Note (Signed)
History and Physical Interval Note: Preprocedure H&P from 06/29/22  was reviewed and there was no interval change after seeing and examining the patient.  Written consent was obtained from the patient after discussion of risks, benefits, and alternatives. Patient has consented to proceed with Colonoscopy with possible intervention   06/29/2022 9:58 AM  Nathaniel Lane  has presented today for surgery, with the diagnosis of h/o colon polyps.  The various methods of treatment have been discussed with the patient and family. After consideration of risks, benefits and other options for treatment, the patient has consented to  Procedure(s): COLONOSCOPY WITH PROPOFOL (N/A) as a surgical intervention.  The patient's history has been reviewed, patient examined, no change in status, stable for surgery.  I have reviewed the patient's chart and labs.  Questions were answered to the patient's satisfaction.     Annamaria Helling

## 2022-06-29 NOTE — Anesthesia Postprocedure Evaluation (Signed)
Anesthesia Post Note  Patient: Nathaniel Lane  Procedure(s) Performed: COLONOSCOPY WITH PROPOFOL  Patient location during evaluation: Endoscopy Anesthesia Type: General Level of consciousness: awake and alert Pain management: pain level controlled Vital Signs Assessment: post-procedure vital signs reviewed and stable Respiratory status: spontaneous breathing, nonlabored ventilation, respiratory function stable and patient connected to nasal cannula oxygen Cardiovascular status: blood pressure returned to baseline and stable Postop Assessment: no apparent nausea or vomiting Anesthetic complications: no   There were no known notable events for this encounter.   Last Vitals:  Vitals:   06/29/22 1105 06/29/22 1107  BP:  123/71  Pulse:    Temp:    SpO2: 97% 98%    Last Pain:  Vitals:   06/29/22 1105  TempSrc:   PainSc: 0-No pain                 Precious Haws Affan Callow

## 2022-06-29 NOTE — Op Note (Signed)
[facility] Gastroenterology Patient Name: Nathaniel Lane Procedure Date: 06/29/2022 9:52 AM MRN: [id number] Account #: [id number] Date of Birth: September 23, 1945 Admit Type: Outpatient Age: 77 Room: [facility] ENDO ROOM 1 Gender: Male Note Status: Finalized Instrument Name: Jasper Riling [id number] Procedure:             Colonoscopy Indications:           High risk colon cancer surveillance: Personal history                         of colonic polyps, High risk colon cancer                         surveillance: Personal history of colon cancer Providers:             Rueben Bash, DO Referring MD:          Deborra Medina, MD (Referring MD) Medicines:             Monitored Anesthesia Care Complications:         No immediate complications. Estimated blood loss:                         Minimal. Procedure:             Pre-Anesthesia Assessment:                        - Prior to the procedure, a History and Physical was                         performed, and patient medications and allergies were                         reviewed. The patient is competent. The risks and                         benefits of the procedure and the sedation options and                         risks were discussed with the patient. All questions                         were answered and informed consent was obtained.                         Patient identification and proposed procedure were                         verified by the physician, the nurse, the anesthetist                         and the technician in the endoscopy suite. Mental                         Status Examination: alert and oriented. Airway                         Examination: normal oropharyngeal airway and neck  mobility. Respiratory Examination: clear to                         auscultation. CV Examination: RRR, no murmurs, no S3                         or S4. Prophylactic Antibiotics: The patient does not                          require prophylactic antibiotics. Prior                         Anticoagulants: The patient has taken no anticoagulant                         or antiplatelet agents. ASA Grade Assessment: III - A                         patient with severe systemic disease. After reviewing                         the risks and benefits, the patient was deemed in                         satisfactory condition to undergo the procedure. The                         anesthesia plan was to use monitored anesthesia care                         (MAC). Immediately prior to administration of                         medications, the patient was re-assessed for adequacy                         to receive sedatives. The heart rate, respiratory                         rate, oxygen saturations, blood pressure, adequacy of                         pulmonary ventilation, and response to care were                         monitored throughout the procedure. The physical                         status of the patient was re-assessed after the                         procedure.                        After obtaining informed consent, the colonoscope was                         passed under direct vision. Throughout the procedure,  the patient's blood pressure, pulse, and oxygen                         saturations were monitored continuously. The                         Colonoscope was introduced through the anus and                         advanced to the the terminal ileum, with                         identification of the appendiceal orifice and IC                         valve. The colonoscopy was performed without                         difficulty. The patient tolerated the procedure well.                         The quality of the bowel preparation was evaluated                         using the BBPS [facility] Bowel Preparation Scale) with                         scores of: Right  Colon = 2 (minor amount of residual                         staining, small fragments of stool and/or opaque                         liquid, but mucosa seen well), Transverse Colon = 2                         (minor amount of residual staining, small fragments of                         stool and/or opaque liquid, but mucosa seen well) and                         Left Colon = 2 (minor amount of residual staining,                         small fragments of stool and/or opaque liquid, but                         mucosa seen well). The total BBPS score equals 6. The                         quality of the bowel preparation was good with large                         amount of lavage. The terminal ileum, ileocecal valve,  appendiceal orifice, and rectum were photographed. Findings:      The perianal and digital rectal examinations were normal. Pertinent       negatives include normal sphincter tone.      The terminal ileum appeared normal. Estimated blood loss: none.      Retroflexion in the right colon was performed.      Non-bleeding internal hemorrhoids were found during retroflexion. The       hemorrhoids were Grade II (internal hemorrhoids that prolapse but reduce       spontaneously). Estimated blood loss: none.      Multiple small-mouthed diverticula were found in the sigmoid colon.       Estimated blood loss: none.      Eight sessile polyps were found in the descending colon (2), ascending       colon (6, same jar ascending and cecum) and cecum. The polyps were 3 to       6 mm in size. These polyps were removed with a cold snare. Resection and       retrieval were complete. Estimated blood loss was minimal.      Three sessile polyps were found in the descending colon (1) and       ascending colon (2). The polyps were 1 to 2 mm in size. These polyps       were removed with a jumbo cold forceps. Resection and retrieval were       complete. Estimated blood loss was  minimal. Impression:            - The examined portion of the ileum was normal.                        - Non-bleeding internal hemorrhoids.                        - Diverticulosis in the sigmoid colon.                        - Eight 3 to 6 mm polyps in the descending colon, in                         the ascending colon and in the cecum, removed with a                         cold snare. Resected and retrieved.                        - Three 1 to 2 mm polyps in the descending colon and                         in the ascending colon, removed with a jumbo cold                         forceps. Resected and retrieved. Recommendation:        - Patient has a contact number available for                         emergencies. The signs and symptoms of potential  delayed complications were discussed with the patient.                         Return to normal activities tomorrow. Written                         discharge instructions were provided to the patient.                        - Discharge patient to home.                        - Resume previous diet.                        - Continue present medications.                        - No aspirin, ibuprofen, naproxen, or other                         non-steroidal anti-inflammatory drugs for 5 days after                         polyp removal.                        - Await pathology results.                        - Repeat colonoscopy in 1 year for surveillance.                        - Return to GI office as previously scheduled.                        - Recommend genetic testing if not already performed                         given patient history.                        - The findings and recommendations were discussed with                         the patient. Procedure Code(s):     --- Professional ---                        [phone], Colonoscopy, flexible; with removal of                         tumor(s), polyp(s), or  other lesion(s) by snare                         technique                        16010, 96, Colonoscopy, flexible; with biopsy, single                         or multiple Diagnosis Code(s):     --- Professional ---  Z86.010, Personal history of colonic polyps                        Z85.038, Personal history of other malignant neoplasm                         of large intestine                        K64.1, Second degree hemorrhoids                        D12.0, Benign neoplasm of cecum                        D12.4, Benign neoplasm of descending colon                        D12.2, Benign neoplasm of ascending colon                        K57.30, Diverticulosis of large intestine without                         perforation or abscess without bleeding CPT copyright 2022 [employer]. All rights reserved. The codes documented in this report are preliminary and upon coder review may  be revised to meet current compliance requirements. Attending Participation:      I personally performed the entire procedure. Volney American, DO Annamaria Helling DO, DO 06/29/2022 10:55:09 AM This report has been signed electronically. Number of Addenda: 0 Note Initiated On: 06/29/2022 9:52 AM Scope Withdrawal Time: 0 hours 26 minutes 6 seconds  Total Procedure Duration: 0 hours 35 minutes 33 seconds  Estimated Blood Loss:  Estimated blood loss was minimal.      [facility]

## 2022-07-02 ENCOUNTER — Encounter: Payer: Self-pay | Admitting: Gastroenterology

## 2022-07-02 LAB — SURGICAL PATHOLOGY

## 2022-07-25 ENCOUNTER — Other Ambulatory Visit: Payer: Self-pay | Admitting: Internal Medicine

## 2022-07-25 DIAGNOSIS — E119 Type 2 diabetes mellitus without complications: Secondary | ICD-10-CM | POA: Diagnosis not present

## 2022-08-06 ENCOUNTER — Ambulatory Visit (INDEPENDENT_AMBULATORY_CARE_PROVIDER_SITE_OTHER): Payer: PPO

## 2022-08-06 VITALS — Ht 71.0 in | Wt 164.0 lb

## 2022-08-06 DIAGNOSIS — Z0000 Encounter for general adult medical examination without abnormal findings: Secondary | ICD-10-CM

## 2022-08-06 LAB — TSH: TSH: 14.4 — AB (ref 0.41–5.90)

## 2022-08-06 NOTE — Progress Notes (Addendum)
Subjective:   Nathaniel Lane is a 77 y.o. male who presents for Medicare Annual/Subsequent preventive examination.  Review of Systems    No ROS.  Medicare Wellness Virtual Visit.  Visual/audio telehealth visit, UTA vital signs.   See social history for additional risk factors.   Cardiac Risk Factors include: advanced age (>18mn, >>60women);male gender;diabetes mellitus     Objective:    Today's Vitals   08/06/22 1406  Weight: 164 lb (74.4 kg)  Height: '5\' 11"'$  (1.803 m)   Body mass index is 22.87 kg/m.     08/06/2022    2:13 PM 06/29/2022    9:39 AM 08/03/2021    9:15 AM 03/22/2021    6:48 AM 08/02/2020    9:25 AM 07/31/2019    9:19 AM 06/26/2019   11:29 AM  Advanced Directives  Does Patient Have a Medical Advance Directive? Yes No Yes No Yes Yes Yes  Type of AParamed[place]of [place] will  Healthcare Power of [place]  Does patient want to make changes to medical advance directive? No - Patient declined  No - Patient declined  No - Patient declined No - Patient declined   Copy of [place] Chart? No - copy requested  No - copy requested  No - copy requested No - copy requested   Would patient like information on creating a medical advance directive?       No - Patient declined    Current Medications (verified) Outpatient Encounter Medications as of 08/06/2022  Medication Sig   aspirin EC (ASPIRIN LOW DOSE) 81 MG tablet TAKE 1 TABLET (81 MG TOTAL) BY MOUTH DAILY. SWALLOW WHOLE.   atorvastatin (LIPITOR) 40 MG tablet TAKE 1 TABLET BY MOUTH EVERY DAY (Patient not taking: Reported on 08/06/2022)   blood glucose meter kit and supplies KIT Dispense based on patient and insurance preference. Use up to four times daily as directed. (FOR ICD-10 E11.29).   FARXIGA 10 MG TABS tablet TAKE 1 TABLET BY MOUTH EVERY DAY   gabapentin (NEURONTIN) 300 MG capsule TAKE 1  CAPSULE BY MOUTH 4 TIMES DAILY.   glipiZIDE (GLUCOTROL XL) 5 MG 24 hr tablet TAKE 1 TABLET BY MOUTH EVERY DAY WITH BREAKFAST   glipiZIDE (GLUCOTROL) 5 MG tablet TAKE 1 TABLET (5 MG TOTAL) BY MOUTH DAILY BEFORE SUPPER.   glucose blood (ONETOUCH ULTRA) test strip USE AS DIRECTED 4 TIMES A DAY   levothyroxine (SYNTHROID) 137 MCG tablet Take 137 mcg by mouth daily before breakfast.   losartan (COZAAR) 50 MG tablet TAKE 1 TABLET BY MOUTH EVERY DAY   metFORMIN (GLUCOPHAGE-XR) 750 MG 24 hr tablet TAKE 1 TABLET BY MOUTH EVERY DAY WITH BREAKFAST   omeprazole (PRILOSEC) 20 MG capsule Take 20 mg by mouth daily.   ONETOUCH DELICA LANCETS FINE MISC USE AS DIRECTED 4 TIMES A DAY   tamsulosin (FLOMAX) 0.4 MG CAPS capsule TAKE 1 CAPSULE BY MOUTH EVERY DAY   No facility-administered encounter medications on file as of 08/06/2022.    Allergies (verified) Penicillins   History: Past Medical History:  Diagnosis Date   [place] ([facility]   Stage IIA squamous cell Lung Ca   CHF (congestive heart failure) ([facility] 01/28/2017   Diabetes mellitus    Gout    Hyperlipidemia    Hypertension    Myocardial infarction ([facility]    Thyroid cancer ([facility] 2013   s/p thyroidectomy 2014  Past Surgical History:  Procedure Laterality Date   COLONOSCOPY N/A 04/25/2016   Procedure: COLONOSCOPY;  Surgeon: Manya Silvas, MD;  Location[facility] ENDOSCOPY;  Service: Endoscopy;  Laterality: N/A;  Diabetic (oral meds)   COLONOSCOPY WITH PROPOFOL N/A 03/22/2021   Procedure: COLONOSCOPY WITH PROPOFOL;  Surgeon: Lin Landsman, MD;  Location: [facility] ENDOSCOPY;  Service: Gastroenterology;  Laterality: N/A;   COLONOSCOPY WITH PROPOFOL N/A 06/29/2022   Procedure: COLONOSCOPY WITH PROPOFOL;  Surgeon: Annamaria Helling, DO;  Location: [facility] ENDOSCOPY;  Service: Gastroenterology;  Laterality: N/A;   CORONARY ARTERY BYPASS GRAFT  2007   LUNG LOBECTOMY     TOTAL THYROIDECTOMY N/A    Family History  Problem Relation Age of Onset    Hypertension Other    Heart disease Father    Social History   Socioeconomic History   Marital status: Widowed    Spouse name: Not on file   Number of children: Not on file   Years of education: Not on file   Highest education level: Not on file  Occupational History   Occupation: Parts Delivery    Employer: retired  Tobacco Use   Smoking status: Former    Types: Cigarettes    Quit date: 07/15/2005    Years since quitting: 17.0    Passive exposure: Past   Smokeless tobacco: Never  Vaping Use   Vaping Use: Never used  Substance and Sexual Activity   Alcohol use: No   Drug use: No   Sexual activity: Not on file  Other Topics Concern   Not on file  Social History Narrative   Lives alone; wife passed away 06-27-2019   Social Determinants of Health   Financial Resource Strain: Low Risk  (08/06/2022)   Overall Financial Resource Strain (CARDIA)    Difficulty of Paying Living Expenses: Not hard at all  Food Insecurity: No Food Insecurity (08/06/2022)   Hunger Vital Sign    Worried About Running Out of Food in the Last Year: Never true    [place] in the Last Year: Never true  Transportation Needs: No Transportation Needs (08/06/2022)   PRAPARE - Hydrologist (Medical): No    Lack of Transportation (Non-Medical): No  Physical Activity: Insufficiently Active (08/06/2022)   Exercise Vital Sign    Days of Exercise per Week: 4 days    Minutes of Exercise per Session: 30 min  Stress: No Stress Concern Present (08/06/2022)   [place]    Feeling of Stress : Not at all  Social Connections: Unknown (08/06/2022)   Social Connection and Isolation Panel [NHANES]    Frequency of Communication with Friends and Family: More than three times a week    Frequency of Social Gatherings with Friends and Family: More than three times a week    Attends Religious Services: Not on file    Active  Member of Clubs or Organizations: Yes    Attends Archivist Meetings: More than 4 times per year    Marital Status: Widowed    Tobacco Counseling Counseling given: Not Answered   Clinical Intake:  Pre-visit preparation completed: Yes        Diabetes: Yes (Followed by pcp)  How often do you need to have someone help you when you read instructions, pamphlets, or other written materials from your doctor or pharmacy?: 1 - Never  Nutrition Risk Assessment: Has the patient had any N/V/D within the  last 2 months?  No  Does the patient have any non-healing wounds?  No  Has the patient had any unintentional weight loss or weight gain?  No   Diabetes: Is the patient diabetic?  Yes  If diabetic, was a CBG obtained today?  Yes , FBS 132 Did the patient bring in their glucometer from home?  No  How often do you monitor your CBG's? daily.   Financial Strains and Diabetes Management: Are you having any financial strains with the device, your supplies or your medication? No .  Does the patient want to be seen by Chronic Care Management for management of their diabetes?  No  Would the patient like to be referred to a Nutritionist or for Diabetic Management?  No   Interpreter Needed?: No    Activities of Daily Living    08/06/2022    2:14 PM  In your present state of health, do you have any difficulty performing the following activities:  Hearing? 0  Vision? 0  Difficulty concentrating or making decisions? 0  Walking or climbing stairs? 0  Dressing or bathing? 0  Doing errands, shopping? 0  Preparing Food and eating ? N  Using the Toilet? N  In the past six months, have you accidently leaked urine? N  Do you have problems with loss of bowel control? N  Managing your Medications? N  Managing your Finances? N  Housekeeping or managing your Housekeeping? N    Patient Care Team: Crecencio Mc, MD as PCP - General (Internal Medicine)  Indicate any recent Medical  Services you may have received from other than [facility] providers in the past year (date may be approximate).     Assessment:   This is a routine wellness examination for [place].  I connected with  [name] on 08/06/22 by a audio enabled telemedicine application and verified that I am speaking with the correct person using two identifiers.  Patient Location: Home  Provider Location: Office/Clinic  I discussed the limitations of evaluation and management by telemedicine. The patient expressed understanding and agreed to proceed.   Hearing/Vision screen Hearing Screening - Comments:: Patient is able to hear conversational tones without difficulty. No issues reported. Vision Screening - Comments:: Followed by [facility] Wears corrective lenses They have regular follow up with the ophthalmologist    Dietary issues and exercise activities discussed: Current Exercise Habits: Home exercise routine, Type of exercise: walking, Time (Minutes): 30, Frequency (Times/Week): 4, Weekly Exercise (Minutes/Week): 120, Intensity: Mild   Goals Addressed             This Visit's Progress    Increase physical activity   On track    Stay active. Walk for exercise, moderate pace       Depression Screen    08/06/2022    2:11 PM 01/31/2022    8:40 AM 09/21/2021    8:47 AM 08/03/2021    9:14 AM 03/02/2021    8:18 AM 11/28/2020    9:03 AM 08/02/2020    9:15 AM  PHQ 2/9 Scores  PHQ - 2 Score 0 0 0 0 0 1 0  PHQ- 9 Score      5     Fall Risk    08/06/2022    2:13 PM 05/10/2022    8:22 AM 01/31/2022    8:40 AM 09/21/2021    8:47 AM 08/03/2021    9:20 AM  Fall Risk   Falls in the past year? 0 0 0  0 0  Number falls in past yr: 0 0   0  Injury with Fall? 0 0     Risk for fall due to :  No Fall Risks No Fall Risks No Fall Risks   Follow up Falls evaluation completed;Falls prevention discussed Falls evaluation completed Falls evaluation completed Falls evaluation completed Falls evaluation  completed    FALL RISK PREVENTION PERTAINING TO THE HOME: Home free of loose throw rugs in walkways, pet beds, electrical cords, etc? Yes  Adequate lighting in your home to reduce risk of falls? Yes   ASSISTIVE DEVICES UTILIZED TO PREVENT FALLS: Life alert? No  Use of a cane, walker or w/c? No  Grab bars in the bathroom? No  Shower chair or bench in shower? No  Elevated toilet seat or a handicapped toilet? No   TIMED UP AND GO: Was the test performed? No .   Cognitive Function:    02/20/2017    9:23 AM 11/01/2014    4:08 PM  MMSE - Mini Mental State Exam  Orientation to time 5 5  Orientation to Place 5 5  Registration 3 3  Attention/ Calculation 5 5  Recall 3 3  Language- name 2 objects 2 2  Language- repeat 1 1  Language- follow 3 step command 3 3  Language- read & follow direction 1 1  Write a sentence 1 1  Copy design 1 1  Total score 30 30        08/06/2022    2:15 PM 08/03/2021    9:20 AM 08/02/2020    9:17 AM 07/29/2018    8:16 AM  6CIT Screen  What Year? 0 points 0 points 0 points 0 points  What month? 0 points 0 points 0 points 0 points  What time? 0 points 0 points 0 points 0 points  Count back from 20 0 points 0 points 0 points 0 points  Months in reverse 0 points 0 points 0 points 0 points  Repeat phrase 0 points 0 points  0 points  Total Score 0 points 0 points  0 points    Immunizations Immunization History  Administered Date(s) Administered   Influenza Split 04/15/2013   PFIZER Comirnaty(Gray Top)Covid-19 Tri-Sucrose Vaccine 12/30/2019, 08/21/2020   PFIZER(Purple Top)SARS-COV-2 Vaccination 01/09/2020, 01/30/2020, 08/21/2020   Pneumococcal Conjugate-13 07/15/2013   Pneumococcal Polysaccharide-23 01/15/2012, 05/06/2017   Tdap 03/06/2007, 08/20/2019   Covid-19 vaccine status: Completed vaccines   Shingrix Completed?: No.    Education has been provided regarding the importance of this vaccine. Patient has been advised to call insurance company to  determine out of pocket expense if they have not yet received this vaccine. Advised may also receive vaccine at local pharmacy or Health Dept. Verbalized acceptance and understanding.  Screening Tests Health Maintenance  Topic Date Due   COVID-19 Vaccine (6 - 2023-24 season) 08/22/2022 (Originally 02/23/2022)   INFLUENZA VACCINE  09/23/2022 (Originally 01/23/2022)   Zoster Vaccines- Shingrix (1 of 2) 11/04/2022 (Originally 12/04/1997)   HEMOGLOBIN A1C  11/08/2022   FOOT EXAM  11/11/2022   Diabetic kidney evaluation - eGFR measurement  05/26/2023   Diabetic kidney evaluation - Urine ACR  05/26/2023   OPHTHALMOLOGY EXAM  07/15/2023   Medicare Annual Wellness (AWV)  08/07/2023   COLONOSCOPY (Pts 45-13yr Insurance coverage will need to be confirmed)  06/29/2025   DTaP/Tdap/Td (3 - Td or Tdap) 08/19/2029   Pneumonia Vaccine 75 Years old  Completed   Hepatitis C Screening  Completed   HPV VACCINES  Aged Out    Health Maintenance There are no preventive care reminders to display for this patient.  DG Chest 2 View- completed 05/11/22.   Vision Screening: Recommended annual ophthalmology exams for early detection of glaucoma and other disorders of the eye.  Dental Screening: Recommended annual dental exams for proper oral hygiene.  Community Resource Referral / Chronic Care Management: CRR required this visit?  No   CCM required this visit?  No      Plan:     I have personally reviewed and noted the following in the patient's chart:   Medical and social history Use of alcohol, tobacco or illicit drugs  Current medications and supplements including opioid prescriptions. Patient is not currently taking opioid prescriptions. Functional ability and status Nutritional status Physical activity Advanced directives List of other physicians Hospitalizations, surgeries, and ER visits in previous 12 months Vitals Screenings to include cognitive, depression, and falls Referrals and  appointments  In addition, I have reviewed and discussed with patient certain preventive protocols, quality metrics, and best practice recommendations. A written personalized care plan for preventive services as well as general preventive health recommendations were provided to patient.     [place], LPN   [id number]     I have reviewed the above information and agree with above.   Deborra Medina, MD

## 2022-08-06 NOTE — Patient Instructions (Addendum)
[name] , Thank you for taking time to come for your Medicare Wellness Visit. I appreciate your ongoing commitment to your health goals. Please review the following plan we discussed and let me know if I can assist you in the future.   These are the goals we discussed:  Goals Addressed             This Visit's Progress    Increase physical activity   On track    Stay active. Walk for exercise, moderate pace         This is a list of the screening recommended for you and due dates:  Health Maintenance  Topic Date Due   COVID-19 Vaccine (6 - 2023-24 season) 08/22/2022*   Flu Shot  09/23/2022*   Zoster (Shingles) Vaccine (1 of 2) 11/04/2022*   Hemoglobin A1C  11/08/2022   Complete foot exam   11/11/2022   Yearly kidney function blood test for diabetes  05/26/2023   Yearly kidney health urinalysis for diabetes  05/26/2023   Eye exam for diabetics  07/15/2023   Medicare Annual Wellness Visit  08/07/2023   Colon Cancer Screening  06/29/2025   DTaP/Tdap/Td vaccine (3 - Td or Tdap) 08/19/2029   Pneumonia Vaccine  Completed   Hepatitis C Screening: USPSTF Recommendation to screen - Ages 32-79 yo.  Completed   HPV Vaccine  Aged Out  *Topic was postponed. The date shown is not the original due date.    Advanced directives: End of life planning; Advance aging; Advanced directives discussed.  Copy of current HCPOA/Living Will requested.    Conditions/risks identified: none new  Next appointment: Follow up in one year for your annual wellness visit.   Preventive Care 11 Years and Older, Male  Preventive care refers to lifestyle choices and visits with your health care provider that can promote health and wellness. What does preventive care include? A yearly physical exam. This is also called an annual well check. Dental exams once or twice a year. Routine eye exams. Ask your health care provider how often you should have your eyes checked. Personal lifestyle choices,  including: Daily care of your teeth and gums. Regular physical activity. Eating a healthy diet. Avoiding tobacco and drug use. Limiting alcohol use. Practicing safe sex. Taking low doses of aspirin every day. Taking vitamin and mineral supplements as recommended by your health care provider. What happens during an annual well check? The services and screenings done by your health care provider during your annual well check will depend on your age, overall health, lifestyle risk factors, and family history of disease. Counseling  Your health care provider may ask you questions about your: Alcohol use. Tobacco use. Drug use. Emotional well-being. Home and relationship well-being. Sexual activity. Eating habits. History of falls. Memory and ability to understand (cognition). Work and work Statistician. Screening  You may have the following tests or measurements: Height, weight, and BMI. Blood pressure. Lipid and cholesterol levels. These may be checked every 5 years, or more frequently if you are over 69 years old. Skin check. Lung cancer screening. You may have this screening every year starting at age 79 if you have a 30-pack-year history of smoking and currently smoke or have quit within the past 15 years. Fecal occult blood test (FOBT) of the stool. You may have this test every year starting at age 66. Flexible sigmoidoscopy or colonoscopy. You may have a sigmoidoscopy every 5 years or a colonoscopy every 10 years starting at age 54.  Prostate cancer screening. Recommendations will vary depending on your family history and other risks. Hepatitis C blood test. Hepatitis B blood test. Sexually transmitted disease (STD) testing. Diabetes screening. This is done by checking your blood sugar (glucose) after you have not eaten for a while (fasting). You may have this done every 1-3 years. Abdominal aortic aneurysm (AAA) screening. You may need this if you are a current or former  smoker. Osteoporosis. You may be screened starting at age 13 if you are at high risk. Talk with your health care provider about your test results, treatment options, and if necessary, the need for more tests. Vaccines  Your health care provider may recommend certain vaccines, such as: Influenza vaccine. This is recommended every year. Tetanus, diphtheria, and acellular pertussis (Tdap, Td) vaccine. You may need a Td booster every 10 years. Zoster vaccine. You may need this after age 64. Pneumococcal 13-valent conjugate (PCV13) vaccine. One dose is recommended after age 98. Pneumococcal polysaccharide (PPSV23) vaccine. One dose is recommended after age 42. Talk to your health care provider about which screenings and vaccines you need and how often you need them. This information is not intended to replace advice given to you by your health care provider. Make sure you discuss any questions you have with your health care provider. Document Released: 07/08/2015 Document Revised: 02/29/2016 Document Reviewed: 04/12/2015 Elsevier Interactive Patient Education  2017 [place] Prevention in the Home Falls can cause injuries. They can happen to people of all ages. There are many things you can do to make your home safe and to help prevent falls. What can I do on the outside of my home? Regularly fix the edges of walkways and driveways and fix any cracks. Remove anything that might make you trip as you walk through a door, such as a raised step or threshold. Trim any bushes or trees on the path to your home. Use bright outdoor lighting. Clear any walking paths of anything that might make someone trip, such as rocks or tools. Regularly check to see if handrails are loose or broken. Make sure that both sides of any steps have handrails. Any raised decks and porches should have guardrails on the edges. Have any leaves, snow, or ice cleared regularly. Use sand or salt on walking paths during  winter. Clean up any spills in your garage right away. This includes oil or grease spills. What can I do in the bathroom? Use night lights. Install grab bars by the toilet and in the tub and shower. Do not use towel bars as grab bars. Use non-skid mats or decals in the tub or shower. If you need to sit down in the shower, use a plastic, non-slip stool. Keep the floor dry. Clean up any water that spills on the floor as soon as it happens. Remove soap buildup in the tub or shower regularly. Attach bath mats securely with double-sided non-slip rug tape. Do not have throw rugs and other things on the floor that can make you trip. What can I do in the bedroom? Use night lights. Make sure that you have a light by your bed that is easy to reach. Do not use any sheets or blankets that are too big for your bed. They should not hang down onto the floor. Have a firm chair that has side arms. You can use this for support while you get dressed. Do not have throw rugs and other things on the floor that can make you trip.  What can I do in the kitchen? Clean up any spills right away. Avoid walking on wet floors. Keep items that you use a lot in easy-to-reach places. If you need to reach something above you, use a strong step stool that has a grab bar. Keep electrical cords out of the way. Do not use floor polish or wax that makes floors slippery. If you must use wax, use non-skid floor wax. Do not have throw rugs and other things on the floor that can make you trip. What can I do with my stairs? Do not leave any items on the stairs. Make sure that there are handrails on both sides of the stairs and use them. Fix handrails that are broken or loose. Make sure that handrails are as long as the stairways. Check any carpeting to make sure that it is firmly attached to the stairs. Fix any carpet that is loose or worn. Avoid having throw rugs at the top or bottom of the stairs. If you do have throw rugs,  attach them to the floor with carpet tape. Make sure that you have a light switch at the top of the stairs and the bottom of the stairs. If you do not have them, ask someone to add them for you. What else can I do to help prevent falls? Wear shoes that: Do not have high heels. Have rubber bottoms. Are comfortable and fit you well. Are closed at the toe. Do not wear sandals. If you use a stepladder: Make sure that it is fully opened. Do not climb a closed stepladder. Make sure that both sides of the stepladder are locked into place. Ask someone to hold it for you, if possible. Clearly mark and make sure that you can see: Any grab bars or handrails. First and last steps. Where the edge of each step is. Use tools that help you move around (mobility aids) if they are needed. These include: Canes. Walkers. Scooters. Crutches. Turn on the lights when you go into a dark area. Replace any light bulbs as soon as they burn out. Set up your furniture so you have a clear path. Avoid moving your furniture around. If any of your floors are uneven, fix them. If there are any pets around you, be aware of where they are. Review your medicines with your doctor. Some medicines can make you feel dizzy. This can increase your chance of falling. Ask your doctor what other things that you can do to help prevent falls. This information is not intended to replace advice given to you by your health care provider. Make sure you discuss any questions you have with your health care provider. Document Released: 04/07/2009 Document Revised: 11/17/2015 Document Reviewed: 07/16/2014 Elsevier Interactive Patient Education  2017 [employer].

## 2022-08-13 DIAGNOSIS — M25511 Pain in right shoulder: Secondary | ICD-10-CM | POA: Diagnosis not present

## 2022-08-15 ENCOUNTER — Encounter: Payer: Self-pay | Admitting: Nurse Practitioner

## 2022-08-15 ENCOUNTER — Ambulatory Visit (INDEPENDENT_AMBULATORY_CARE_PROVIDER_SITE_OTHER): Payer: PPO | Admitting: Nurse Practitioner

## 2022-08-15 VITALS — BP 126/76 | HR 67 | Temp 97.5°F | Ht 71.0 in | Wt 168.8 lb

## 2022-08-15 DIAGNOSIS — M25511 Pain in right shoulder: Secondary | ICD-10-CM

## 2022-08-15 MED ORDER — HYDROCODONE-ACETAMINOPHEN 5-325 MG PO TABS: 1.0000 | ORAL_TABLET | Freq: Four times a day (QID) | ORAL | 0 refills | Status: AC | PRN

## 2022-08-15 NOTE — Patient Instructions (Signed)
It was nice to meet you.   I have sent some pain medication to your pharmacy.   I placed the referral to Orthopedics. They should contact you to schedule an appointment.

## 2022-08-15 NOTE — Assessment & Plan Note (Signed)
Referral placed to Orthopedics. Will treat acute pain with Norco 5/325 every 6 hours PRN for moderate/severe pain x 5 days. PDMP reviewed. Encouraged patient to ice/heat painful areas. Advised to not take any Tylenol when taking pain medication. Counseled on side effects of Norco such as dizziness, sedation and constipation.

## 2022-08-15 NOTE — Progress Notes (Signed)
Tomasita Morrow, NP-C Phone: [phone]  Nathaniel Lane is a 77 y.o. male who presents today for shoulder pain x 4 weeks.  Patient was seen in Urgent Care on 08/13/2022 for his shoulder pain. 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 He has been taking Tylenol around the clock without relief in pain. He reports the pain has been gradually worsening over the last 4 weeks. He is unable to lift his arm above his head. He is limited in all activities. He reports is pain is an 8/10.   Shoulder Pain: Patient complaints of right shoulder pain. This is evaluated as a personal injury. The pain is described as sharp, stabbing, and pinching .  The onset of the pain was gradual, starting about 4 weeks ago.  The pain occurs continuously.  Location is anterior, posterior, biceps tendon. No history of dislocation. Symptoms are aggravated by all activities. Symptoms are diminished by  rest.   Limited activities include: all activities. No stiffness, moderate weakness, no swelling, no crepitus noted is reported. 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   Social History   Tobacco Use  Smoking Status Former   Types: Cigarettes   Quit date: 07/15/2005   Years since quitting: 17.0   Passive exposure: Past  Smokeless Tobacco Never    Current Outpatient Medications on File Prior to Visit  Medication Sig Dispense Refill   aspirin EC (ASPIRIN LOW DOSE) 81 MG tablet TAKE 1 TABLET (81 MG TOTAL) BY MOUTH DAILY. SWALLOW WHOLE. 90 tablet 3   atorvastatin (LIPITOR) 40 MG tablet TAKE 1 TABLET BY MOUTH EVERY DAY 90 tablet 1   blood glucose meter kit and supplies KIT Dispense based on patient and insurance preference. Use up to four times daily as directed. (FOR ICD-10 E11.29). 1 each 0   FARXIGA 10 MG TABS tablet TAKE 1 TABLET BY MOUTH EVERY DAY 30 tablet 5   gabapentin (NEURONTIN) 300 MG capsule TAKE 1 CAPSULE BY MOUTH 4 TIMES DAILY. 360 capsule 2   glipiZIDE (GLUCOTROL XL)  5 MG 24 hr tablet TAKE 1 TABLET BY MOUTH EVERY DAY WITH BREAKFAST 90 tablet 1   glipiZIDE (GLUCOTROL) 5 MG tablet TAKE 1 TABLET (5 MG TOTAL) BY MOUTH DAILY BEFORE SUPPER. 90 tablet 1   glucose blood (ONETOUCH ULTRA) test strip USE AS DIRECTED 4 TIMES A DAY 100 strip 2   levothyroxine (SYNTHROID) 137 MCG tablet Take 137 mcg by mouth daily before breakfast.     losartan (COZAAR) 50 MG tablet TAKE 1 TABLET BY MOUTH EVERY DAY 90 tablet 3   metFORMIN (GLUCOPHAGE-XR) 750 MG 24 hr tablet TAKE 1 TABLET BY MOUTH EVERY DAY WITH BREAKFAST 90 tablet 1   omeprazole (PRILOSEC) 20 MG capsule Take 20 mg by mouth daily.     ONETOUCH DELICA LANCETS FINE MISC USE AS DIRECTED 4 TIMES A DAY 100 each 2   tamsulosin (FLOMAX) 0.4 MG CAPS capsule TAKE 1 CAPSULE BY MOUTH EVERY DAY 90 capsule 1   No current facility-administered medications on file prior to visit.     ROS see history of present illness  Objective  Physical Exam Vitals:   08/15/22 1438  BP: 126/76  Pulse: 67  Temp: (!) 97.5 F (36.4 C)  SpO2: 95%    BP Readings from Last 3 Encounters:  08/15/22 126/76  06/29/22 123/71  05/10/22 126/84   Wt Readings from Last 3 Encounters:  08/15/22 168 lb 12.8 oz (76.6  kg)  08/06/22 164 lb (74.4 kg)  06/29/22 164 lb 3.2 oz (74.5 kg)    Physical Exam Constitutional:      General: He is not in acute distress.    Appearance: Normal appearance.  HENT:     Head: Normocephalic.  Cardiovascular:     Rate and Rhythm: Normal rate and regular rhythm.     Heart sounds: Normal heart sounds.  Pulmonary:     Effort: Pulmonary effort is normal.     Breath sounds: Normal breath sounds.  Musculoskeletal:     Right shoulder: Tenderness (anterior- joint) present. No swelling, deformity, effusion or crepitus. Decreased range of motion (unable to reach above head, pain with flexion, extension, and abduction.). Decreased strength (Patient is unable to bear any weight or apply pressure without pain. Reports relief  of pain when pressure is applied in extension and abduction.).  Skin:    General: Skin is warm and dry.  Neurological:     General: No focal deficit present.     Mental Status: He is alert.  Psychiatric:        Mood and Affect: Mood normal.        Behavior: Behavior normal.    Assessment/Plan: Please see individual problem list.  Acute pain of right shoulder Assessment & Plan: Referral placed to Orthopedics. Will treat acute pain with Norco 5/325 every 6 hours PRN for moderate/severe pain x 5 days. PDMP reviewed. Encouraged patient to ice/heat painful areas. Advised to not take any Tylenol when taking pain medication. Counseled on side effects of Norco such as dizziness, sedation and constipation.   Orders: -     HYDROcodone-Acetaminophen; Take 1 tablet by mouth every 6 (six) hours as needed for up to 5 days for severe pain.  Dispense: 20 tablet; Refill: 0 -     Ambulatory referral to Orthopedic Surgery   Return if symptoms worsen or fail to improve.   Tomasita Morrow, NP-C [place]

## 2022-08-20 DIAGNOSIS — Z85850 Personal history of malignant neoplasm of thyroid: Secondary | ICD-10-CM | POA: Diagnosis not present

## 2022-08-20 DIAGNOSIS — E890 Postprocedural hypothyroidism: Secondary | ICD-10-CM | POA: Diagnosis not present

## 2022-08-20 DIAGNOSIS — M7541 Impingement syndrome of right shoulder: Secondary | ICD-10-CM | POA: Diagnosis not present

## 2022-08-28 ENCOUNTER — Other Ambulatory Visit: Payer: Self-pay | Admitting: Internal Medicine

## 2022-08-28 DIAGNOSIS — E1122 Type 2 diabetes mellitus with diabetic chronic kidney disease: Secondary | ICD-10-CM | POA: Diagnosis not present

## 2022-08-28 DIAGNOSIS — U071 COVID-19: Secondary | ICD-10-CM | POA: Diagnosis not present

## 2022-08-28 DIAGNOSIS — I10 Essential (primary) hypertension: Secondary | ICD-10-CM | POA: Diagnosis not present

## 2022-08-28 DIAGNOSIS — R8290 Unspecified abnormal findings in urine: Secondary | ICD-10-CM | POA: Diagnosis not present

## 2022-08-28 DIAGNOSIS — R809 Proteinuria, unspecified: Secondary | ICD-10-CM | POA: Diagnosis not present

## 2022-08-28 DIAGNOSIS — N2581 Secondary hyperparathyroidism of renal origin: Secondary | ICD-10-CM | POA: Diagnosis not present

## 2022-08-28 DIAGNOSIS — D3502 Benign neoplasm of left adrenal gland: Secondary | ICD-10-CM | POA: Diagnosis not present

## 2022-08-28 DIAGNOSIS — N2889 Other specified disorders of kidney and ureter: Secondary | ICD-10-CM | POA: Diagnosis not present

## 2022-08-28 DIAGNOSIS — N1832 Chronic kidney disease, stage 3b: Secondary | ICD-10-CM | POA: Diagnosis not present

## 2022-09-03 DIAGNOSIS — Z85850 Personal history of malignant neoplasm of thyroid: Secondary | ICD-10-CM | POA: Diagnosis not present

## 2022-09-03 DIAGNOSIS — I10 Essential (primary) hypertension: Secondary | ICD-10-CM | POA: Diagnosis not present

## 2022-09-03 DIAGNOSIS — N1832 Chronic kidney disease, stage 3b: Secondary | ICD-10-CM | POA: Diagnosis not present

## 2022-09-03 DIAGNOSIS — E1122 Type 2 diabetes mellitus with diabetic chronic kidney disease: Secondary | ICD-10-CM | POA: Diagnosis not present

## 2022-09-03 DIAGNOSIS — E890 Postprocedural hypothyroidism: Secondary | ICD-10-CM | POA: Diagnosis not present

## 2022-09-03 DIAGNOSIS — N2581 Secondary hyperparathyroidism of renal origin: Secondary | ICD-10-CM | POA: Diagnosis not present

## 2022-09-07 ENCOUNTER — Other Ambulatory Visit: Payer: Self-pay | Admitting: Family

## 2022-09-10 ENCOUNTER — Telehealth: Payer: Self-pay

## 2022-09-10 NOTE — Patient Instructions (Signed)
Visit Information  Thank you for taking time to visit with me today. Please don't hesitate to contact me if I can be of assistance to you.   Following are the goals we discussed today:   Goals Addressed             This Visit's Progress    Heart failure education and managemen of right shoulder pain       Interventions Today    Flowsheet Row Most Recent Value  Chronic Disease   Chronic disease during today's visit Congestive Heart Failure (CHF), Other  [right shoulder pain]  General Interventions   General Interventions Discussed/Reviewed General Interventions Discussed, Doctor Visits  [Advised to call ortho/ surgeon office to inform them recent injection treatment is not working and still in considerable pain. Advised to inform them medication prescribed has ran out need to determine if refill needed.]  Doctor Visits Discussed/Reviewed Doctor Visits Discussed  [Reviewed provider visit note from 08/15/22 with patient.  Reminded to use ice/ heat to right shoulder and take pain medication as recommended.]  Education Interventions   Education Provided Provided Printed Education, Provided Education  [Education article on heart failure action plan and low salt diet mailed to patient.]  Provided Verbal Education On Other  [Discussed HF action plan. Discussed importance of weighing daily and following a low salt diet.  Advised to stay active as tolerated.  Notifiy provider of weight gain of 3 lbs overnight or 5 lbs in a week, increase swelling or shortness of breath.]  Nutrition Interventions   Nutrition Discussed/Reviewed Nutrition Reviewed, Decreasing salt  Pharmacy Interventions   Pharmacy Dicussed/Reviewed Pharmacy Topics Discussed  [Reviewed medications and disussed importance of compliance.]              Our next appointment is by telephone on 10/18/22 at 10 am  Please call the care guide team at [phone] if you need to cancel or reschedule your appointment.   If you are  experiencing a Mental Health or [place] or need someone to talk to, please call the Suicide and Crisis Lifeline: 988  The patient verbalized understanding of instructions, educational materials, and care plan provided today and agreed to receive a mailed copy of patient instructions, educational materials, and care plan.   Quinn Plowman RN,BSN,CCM [place] [facility] [phone] direct line

## 2022-09-10 NOTE — Patient Outreach (Signed)
  Care Coordination   Initial Visit Note   09/10/2022 Name: Nathaniel Lane MRN: [id number] DOB: 26-Dec-1945  Nathaniel Lane is a 77 y.o. year old male who sees Derrel Nip, Aris Everts, MD for primary care. I spoke with  Nathaniel Lane by phone today.  What matters to the patients health and wellness today?  Patient states he is having a lot of pain in his right shoulder.  He considers pain level an 8 out of 10 point scale. Patient reports having injection done by orthopedic surgeon. He states it has been 3 weeks since the injection but still in considerable pain.  Patient states he takes tylenol as needed.   He states he is scheduled for a follow up visit with orthopedic in mid April 2024.  Patient denies any increase swelling or shortness of breath related to his heart failure. He states his weight averages around 165 to 173 lbs.  He states he does not weigh daily.   Goals Addressed             This Visit's Progress    Heart failure education and managemen of right shoulder pain       Interventions Today    Flowsheet Row Most Recent Value  Chronic Disease   Chronic disease during today's visit Congestive Heart Failure (CHF), Other  [right shoulder pain]  General Interventions   General Interventions Discussed/Reviewed General Interventions Discussed, Doctor Visits  [Advised to call ortho/ surgeon office to inform them recent injection treatment is not working and still in considerable pain. Advised to inform them medication prescribed has ran out need to determine if refill needed.]  Doctor Visits Discussed/Reviewed Doctor Visits Discussed  [Reviewed provider visit note from 08/15/22 with patient.  Reminded to use ice/ heat to right shoulder and take pain medication as recommended.]  Education Interventions   Education Provided Provided Printed Education, Provided Education  [Education article on heart failure action plan and low salt diet mailed to patient.]  Provided Verbal Education On Other   [Discussed HF action plan. Discussed importance of weighing daily and following a low salt diet.  Advised to stay active as tolerated.  Notifiy provider of weight gain of 3 lbs overnight or 5 lbs in a week, increase swelling or shortness of breath.]  Nutrition Interventions   Nutrition Discussed/Reviewed Nutrition Reviewed, Decreasing salt  Pharmacy Interventions   Pharmacy Dicussed/Reviewed Pharmacy Topics Discussed  [Reviewed medications and disussed importance of compliance.]              SDOH assessments and interventions completed:  Yes  SDOH Interventions Today    Flowsheet Row Most Recent Value  SDOH Interventions   Food Insecurity Interventions Intervention Not Indicated  Housing Interventions Intervention Not Indicated  Transportation Interventions Intervention Not Indicated        Care Coordination Interventions:  Yes, provided   Follow up plan: Follow up call scheduled for 10/18/22    Encounter Outcome:  Pt. Visit Completed   Quinn Plowman RN,BSN,CCM [place] [phone] direct line

## 2022-09-16 ENCOUNTER — Other Ambulatory Visit: Payer: Self-pay | Admitting: Internal Medicine

## 2022-10-01 DIAGNOSIS — M7541 Impingement syndrome of right shoulder: Secondary | ICD-10-CM | POA: Diagnosis not present

## 2022-10-01 DIAGNOSIS — M25511 Pain in right shoulder: Secondary | ICD-10-CM | POA: Diagnosis not present

## 2022-10-02 DIAGNOSIS — M7541 Impingement syndrome of right shoulder: Secondary | ICD-10-CM | POA: Diagnosis not present

## 2022-10-02 DIAGNOSIS — M25511 Pain in right shoulder: Secondary | ICD-10-CM | POA: Diagnosis not present

## 2022-10-08 ENCOUNTER — Telehealth: Payer: Self-pay | Admitting: Internal Medicine

## 2022-10-08 MED ORDER — METFORMIN HCL ER 750 MG PO TB24: ORAL_TABLET | ORAL | 1 refills | Status: DC

## 2022-10-08 MED ORDER — TAMSULOSIN HCL 0.4 MG PO CAPS: 0.4000 mg | ORAL_CAPSULE | Freq: Every day | ORAL | 1 refills | Status: DC

## 2022-10-08 NOTE — Telephone Encounter (Signed)
Invokana is not in pt's current medication list.

## 2022-10-08 NOTE — Telephone Encounter (Signed)
Pt is calling in stating that he went to the pharmacy and they stated that he need to get permission for the following Rx's tamsulosin (FLOMAX) 0.4 MG and metformin  (GLUCOPHAGE-XR) 750 MG. And pt thinks the Dr. Darrick Huntsman prescribed to him Invokana (canagliflozin)100 MG did not see on med list.  He is needing all of these refilled today due to him being out of these medications.   Pharm:  [facility] in [place], [place]

## 2022-10-09 NOTE — Telephone Encounter (Signed)
Pt gave a verbal understanding. 

## 2022-10-10 DIAGNOSIS — M19011 Primary osteoarthritis, right shoulder: Secondary | ICD-10-CM | POA: Diagnosis not present

## 2022-10-10 DIAGNOSIS — M7541 Impingement syndrome of right shoulder: Secondary | ICD-10-CM | POA: Diagnosis not present

## 2022-10-16 DIAGNOSIS — J449 Chronic obstructive pulmonary disease, unspecified: Secondary | ICD-10-CM | POA: Diagnosis not present

## 2022-10-16 DIAGNOSIS — I509 Heart failure, unspecified: Secondary | ICD-10-CM | POA: Diagnosis not present

## 2022-10-16 DIAGNOSIS — I2510 Atherosclerotic heart disease of native coronary artery without angina pectoris: Secondary | ICD-10-CM | POA: Diagnosis not present

## 2022-10-16 DIAGNOSIS — N182 Chronic kidney disease, stage 2 (mild): Secondary | ICD-10-CM | POA: Diagnosis not present

## 2022-10-16 DIAGNOSIS — I10 Essential (primary) hypertension: Secondary | ICD-10-CM | POA: Diagnosis not present

## 2022-10-16 DIAGNOSIS — I255 Ischemic cardiomyopathy: Secondary | ICD-10-CM | POA: Diagnosis not present

## 2022-10-16 DIAGNOSIS — E119 Type 2 diabetes mellitus without complications: Secondary | ICD-10-CM | POA: Diagnosis not present

## 2022-10-16 DIAGNOSIS — I25721 Atherosclerosis of autologous artery coronary artery bypass graft(s) with angina pectoris with documented spasm: Secondary | ICD-10-CM | POA: Diagnosis not present

## 2022-10-16 DIAGNOSIS — Z85118 Personal history of other malignant neoplasm of bronchus and lung: Secondary | ICD-10-CM | POA: Diagnosis not present

## 2022-10-16 DIAGNOSIS — Z01818 Encounter for other preprocedural examination: Secondary | ICD-10-CM | POA: Diagnosis not present

## 2022-10-18 ENCOUNTER — Ambulatory Visit: Payer: Self-pay

## 2022-10-18 NOTE — Patient Outreach (Signed)
  Care Coordination   Follow Up Visit Note   10/18/2022 Name: Nathaniel Lane MRN: [id number] DOB: September 04, 1945  Nathaniel Lane is a 77 y.o. year old male who sees Darrick Huntsman, Mar Daring, MD for primary care. I spoke with  Nathaniel Lane by phone today.  What matters to the patients health and wellness today?  Patient states he is doing well. He reports having follow up visit this month with the orthopedic doctor.  He reports having cortisone injection for his shoulder pain.  Patient states his pain level is a 5.  He states the pain is somewhat better than it was but has not resolved totally.  Patient denies any heart failure symptoms.   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      Goals Addressed             This Visit's Progress    Heart failure education and managemen of right shoulder pain       Interventions Today    Flowsheet Row Most Recent Value  Chronic Disease   Chronic disease during today's visit Congestive Heart Failure (CHF), Other  [right shoulder pain]  General Interventions   General Interventions Discussed/Reviewed General Interventions Reviewed, Doctor Visits  [evaluation of current treatment plan for HF, shoulder pain and patients adherence to plan as established by provider.  Assessed for heart failure symptoms and current weight. Assessed shoulder pain level on 10 point scale]  Doctor Visits Discussed/Reviewed Doctor Visits Reviewed  Annabell Sabal scheduled / upcoming provider visits]  Exercise Interventions   Exercise Discussed/Reviewed Physical Activity  Physical Activity Discussed/Reviewed Physical Activity Reviewed  [Discussed importance of physical activty in daily routine such as walking 4 to 5 days per week. Advised to stop frequently when driving long distances for movement and advised to stay hydrated when traveling long distances.]  Education Interventions   Education Provided Provided Education  [encouraged patient to consider wearing compression hose when  traveling long distances.]  Provided Verbal Education On Other  [reviewed heart failure symptoms. Advised to notify provider for 3 lb weight gain overnight or 5 lbs in a week as well as for other heart failure symptoms such as SOB.  Advised to call 911 for severe symptoms.]  Nutrition Interventions   Nutrition Discussed/Reviewed Decreasing salt  [Discussed the importance of low salt diet when managing heart failure.]  Pharmacy Interventions   Pharmacy Dicussed/Reviewed Pharmacy Topics Reviewed  [medications reviewed and compliance discussed. Discussed patients prescribed pain management regimen for his shoulder pain.]              SDOH assessments and interventions completed:  No     Care Coordination Interventions:  Yes, provided   Follow up plan: Follow up call scheduled for 11/14/22    Encounter Outcome:  Pt. Visit Completed   George Ina RN,BSN,CCM [facility] Care Coordination [phone] direct line

## 2022-11-01 ENCOUNTER — Telehealth: Payer: Self-pay | Admitting: Internal Medicine

## 2022-11-01 NOTE — Telephone Encounter (Signed)
Patient called and said his Dentist, LTR located in [place]. Has not received clearance fo do a procedure. Patient was ask to call his dentist to refax.

## 2022-11-01 NOTE — Telephone Encounter (Signed)
This was faxed on 10/29/2022 but they are saying they have not received. I have reprinted the original copy for you to fill out and sign again. The signed form has not been scanned into epic yet.

## 2022-11-02 NOTE — Telephone Encounter (Signed)
Can wait until Monday procedure has not been scheduled.

## 2022-11-06 NOTE — Telephone Encounter (Signed)
Pt is aware that it has been faxed again.

## 2022-11-09 ENCOUNTER — Encounter: Payer: Self-pay | Admitting: Internal Medicine

## 2022-11-09 ENCOUNTER — Ambulatory Visit (INDEPENDENT_AMBULATORY_CARE_PROVIDER_SITE_OTHER): Payer: PPO | Admitting: Internal Medicine

## 2022-11-09 VITALS — BP 130/76 | HR 58 | Temp 98.7°F | Ht 70.0 in | Wt 166.8 lb

## 2022-11-09 DIAGNOSIS — E1121 Type 2 diabetes mellitus with diabetic nephropathy: Secondary | ICD-10-CM

## 2022-11-09 DIAGNOSIS — E890 Postprocedural hypothyroidism: Secondary | ICD-10-CM | POA: Diagnosis not present

## 2022-11-09 DIAGNOSIS — E1140 Type 2 diabetes mellitus with diabetic neuropathy, unspecified: Secondary | ICD-10-CM

## 2022-11-09 DIAGNOSIS — E785 Hyperlipidemia, unspecified: Secondary | ICD-10-CM

## 2022-11-09 DIAGNOSIS — N2889 Other specified disorders of kidney and ureter: Secondary | ICD-10-CM | POA: Diagnosis not present

## 2022-11-09 DIAGNOSIS — E1169 Type 2 diabetes mellitus with other specified complication: Secondary | ICD-10-CM

## 2022-11-09 DIAGNOSIS — I700 Atherosclerosis of aorta: Secondary | ICD-10-CM | POA: Diagnosis not present

## 2022-11-09 DIAGNOSIS — Z7984 Long term (current) use of oral hypoglycemic drugs: Secondary | ICD-10-CM

## 2022-11-09 LAB — LIPID PANEL
Cholesterol: 150 mg/dL (ref 0–200)
HDL: 34 mg/dL — ABNORMAL LOW (ref >39.00)
LDL Cholesterol: 80 mg/dL (ref 0–99)
NonHDL: 116.49
Total CHOL/HDL Ratio: 4
Triglycerides: 180 mg/dL — ABNORMAL HIGH (ref 0.0–149.0)
VLDL: 36 mg/dL (ref 0.0–40.0)

## 2022-11-09 LAB — HEPATIC FUNCTION PANEL
ALT: 15 U/L (ref 0–53)
AST: 16 U/L (ref 0–37)
Albumin: 3.8 g/dL (ref 3.5–5.2)
Alkaline Phosphatase: 83 U/L (ref 39–117)
Bilirubin, Direct: 0.1 mg/dL (ref 0.0–0.3)
Total Bilirubin: 0.5 mg/dL (ref 0.2–1.2)
Total Protein: 6.7 g/dL (ref 6.0–8.3)

## 2022-11-09 LAB — BASIC METABOLIC PANEL
BUN: 33 mg/dL — ABNORMAL HIGH (ref 6–23)
CO2: 27 mEq/L (ref 19–32)
Calcium: 9 mg/dL (ref 8.4–10.5)
Chloride: 102 mEq/L (ref 96–112)
Creatinine, Ser: 2.05 mg/dL — ABNORMAL HIGH (ref 0.40–1.50)
GFR: 31.24 mL/min — ABNORMAL LOW (ref >60.00)
Glucose, Bld: 91 mg/dL (ref 70–99)
Potassium: 4.5 mEq/L (ref 3.5–5.1)
Sodium: 135 mEq/L (ref 135–145)

## 2022-11-09 LAB — HEMOGLOBIN A1C: Hgb A1c MFr Bld: 6.3 % (ref 4.6–6.5)

## 2022-11-09 NOTE — Patient Instructions (Addendum)
Dr Gabrielle Dare wanted you to have an MRI of the kidney  mass BEFORE your visit with him on June    PLEASE CALL HIS OFFICE AND FIND OUT WHAT HAPPENED  TO THE MRI HE ORDERED

## 2022-11-09 NOTE — Progress Notes (Signed)
Subjective:  [name], male    DOB: 20-Aug-1945  Age: 77 y.o. MRN: [id number]  CC: The primary encounter diagnosis was Hyperlipidemia associated with type 2 diabetes mellitus (HCC). Diagnoses of Aortic atherosclerosis (HCC), Postsurgical hypothyroidism, Type 2 diabetes mellitus with diabetic neuropathy, without long-term current use of insulin (HCC), and Controlled type 2 diabetes mellitus with diabetic nephropathy, without long-term current use of insulin (HCC) were also pertinent to this visit.   HPI Nathaniel Lane presents for  Chief Complaint  Patient presents with   Medical Management of Chronic Issues   1) Type 2 DM:  fasting sugars checked daily   95% are 140, or less,  he notes and   occasional bump from nighttime snacking   2) HTN:  home readings reviewed  . Has been taking it daily for 3 months.  Readings have been < 120 systolic. He denies orthostatic symptoms   3) bursitis right shoulder:  started in February,  x rays normal in Feb by urgent care[facility].  Rx'd tylenol and referred to orthopedics.  Thus far has received 2 steroid injections without change /improvement .  Being woken up at 2 am  by  pain .  Sleeping on back with pillow under right shoulder.  Has appt at end of month with orthopedics . Has had an MRI of shoulder , records not available.        Outpatient Medications Prior to Visit  Medication Sig Dispense Refill   aspirin EC (ASPIRIN LOW DOSE) 81 MG tablet TAKE 1 TABLET (81 MG TOTAL) BY MOUTH DAILY. SWALLOW WHOLE. 90 tablet 3   atorvastatin (LIPITOR) 20 MG tablet TAKE 1 TABLET BY MOUTH EVERY DAY 90 tablet 1   atorvastatin (LIPITOR) 40 MG tablet TAKE 1 TABLET BY MOUTH EVERY DAY 90 tablet 1   blood glucose meter kit and supplies KIT Dispense based on patient and insurance preference. Use up to four times daily as directed. (FOR ICD-10 E11.29). 1 each 0   FARXIGA 10 MG TABS tablet TAKE 1 TABLET BY MOUTH EVERY DAY 30 tablet 5   gabapentin (NEURONTIN)  300 MG capsule TAKE 1 CAPSULE BY MOUTH 4 TIMES DAILY. 360 capsule 2   glipiZIDE (GLUCOTROL XL) 5 MG 24 hr tablet TAKE 1 TABLET BY MOUTH EVERY DAY WITH BREAKFAST 90 tablet 1   glipiZIDE (GLUCOTROL) 5 MG tablet TAKE 1 TABLET (5 MG TOTAL) BY MOUTH DAILY BEFORE SUPPER. 90 tablet 1   glucose blood (ONETOUCH ULTRA) test strip USE AS DIRECTED 4 TIMES A DAY 100 strip 2   levothyroxine (SYNTHROID) 137 MCG tablet Take 137 mcg by mouth daily before breakfast.     losartan (COZAAR) 50 MG tablet TAKE 1 TABLET BY MOUTH EVERY DAY 90 tablet 3   metFORMIN (GLUCOPHAGE-XR) 750 MG 24 hr tablet TAKE 1 TABLET BY MOUTH EVERY DAY WITH BREAKFAST 90 tablet 1   ONETOUCH DELICA LANCETS FINE MISC USE AS DIRECTED 4 TIMES A DAY 100 each 2   tamsulosin (FLOMAX) 0.4 MG CAPS capsule Take 1 capsule (0.4 mg total) by mouth daily. 90 capsule 1   tiZANidine (ZANAFLEX) 4 MG capsule Take 4 mg by mouth at bedtime.     omeprazole (PRILOSEC) 20 MG capsule Take 20 mg by mouth daily. (Patient not taking: Reported on 09/10/2022)     No facility-administered medications prior to visit.    Review of Systems;  Patient denies headache, fevers, malaise, unintentional weight loss, skin rash, eye pain, sinus congestion and sinus pain,  sore throat, dysphagia,  hemoptysis , cough, dyspnea, wheezing, chest pain, palpitations, orthopnea, edema, abdominal pain, nausea, melena, diarrhea, constipation, flank pain, dysuria, hematuria, urinary  Frequency, nocturia, numbness, tingling, seizures,  Focal weakness, Loss of consciousness,  Tremor, insomnia, depression, anxiety, and suicidal ideation.      Objective:  BP 130/76   Pulse (!) 58   Temp 98.7 F (37.1 C) (Oral)   Ht 5\' 10"  (1.778 m)   Wt 166 lb 12.8 oz (75.7 kg)   SpO2 97%   BMI 23.93 kg/m   BP Readings from Last 3 Encounters:  11/09/22 130/76  08/15/22 126/76  06/29/22 123/71    Wt Readings from Last 3 Encounters:  11/09/22 166 lb 12.8 oz (75.7 kg)  08/15/22 168 lb 12.8 oz (76.6  kg)  08/06/22 164 lb (74.4 kg)    Physical Exam Vitals reviewed.  Constitutional:      General: He is not in acute distress.    Appearance: Normal appearance. He is normal weight. He is not ill-appearing, toxic-appearing or diaphoretic.  HENT:     Head: Normocephalic.  Eyes:     General: No scleral icterus.       Right eye: No discharge.        Left eye: No discharge.     Conjunctiva/sclera: Conjunctivae normal.  Cardiovascular:     Rate and Rhythm: Normal rate and regular rhythm.     Heart sounds: Normal heart sounds.  Pulmonary:     Effort: Pulmonary effort is normal. No respiratory distress.     Breath sounds: Normal breath sounds.  Musculoskeletal:        General: Normal range of motion.     Cervical back: Normal range of motion.  Skin:    General: Skin is warm and dry.  Neurological:     General: No focal deficit present.     Mental Status: He is alert and oriented to person, place, and time. Mental status is at baseline.  Psychiatric:        Mood and Affect: Mood normal.        Behavior: Behavior normal.        Thought Content: Thought content normal.        Judgment: Judgment normal.    Lab Results  Component Value Date   HGBA1C 6.3 11/09/2022   HGBA1C 6.2 (A) 05/10/2022   HGBA1C 6.2 01/26/2022    Lab Results  Component Value Date   CREATININE 2.05 (H) 11/09/2022   CREATININE 2.11 (H) 05/25/2022   CREATININE 2.10 (H) 05/10/2022    Lab Results  Component Value Date   WBC 8.4 05/25/2022   HGB 16.0 05/25/2022   HCT 48.0 05/25/2022   PLT 195 05/25/2022   GLUCOSE 91 11/09/2022   CHOL 150 11/09/2022   TRIG 180.0 (H) 11/09/2022   HDL 34.00 (L) 11/09/2022   LDLDIRECT 85.0 05/10/2022   LDLCALC 80 11/09/2022   ALT 15 11/09/2022   AST 16 11/09/2022   NA 135 11/09/2022   K 4.5 11/09/2022   CL 102 11/09/2022   CREATININE 2.05 (H) 11/09/2022   BUN 33 (H) 11/09/2022   CO2 27 11/09/2022   TSH 14.40 (A) 08/06/2022   PSA 1.14 11/18/2019   INR 1.2  09/27/2011   HGBA1C 6.3 11/09/2022   MICROALBUR 2.6 (H) 01/31/2022    No results found.  Assessment & Plan:  .Hyperlipidemia associated with type 2 diabetes mellitus (HCC) -     Hemoglobin A1c -     Basic metabolic  panel -     Lipid panel -     Hepatic function panel  Aortic atherosclerosis (HCC) Assessment & Plan: Reviewed findings of prior CT scan today..  Patient is tolerating high potency statin therapy   Lab Results  Component Value Date   CHOL 150 11/09/2022   HDL 34.00 (L) 11/09/2022   LDLCALC 80 11/09/2022   LDLDIRECT 85.0 05/10/2022   TRIG 180.0 (H) 11/09/2022   CHOLHDL 4 11/09/2022      Postsurgical hypothyroidism Assessment & Plan: Thyroid was underactive in February and Dr Andrey Spearman increasd levothyroixne dose to 125 to 137 mcg daily 205 mcgo n Sundays    Type 2 diabetes mellitus with diabetic neuropathy, without long-term current use of insulin (HCC) Assessment & Plan: Managed with farxiga, metformin and glipizide,  neuropathy is Managed with 1200 mg gabapentin daily in divided dose. Burning pain is intermittent and seems to be aggravated by elevated sugars and extra starch servings .  No changes today     Controlled type 2 diabetes mellitus with diabetic nephropathy, without long-term current use of insulin (HCC) Assessment & Plan:   Continue metformin,  Farxigan and Losartan foe early microalbuminuria ,   Lab Results  Component Value Date   HGBA1C 6.3 11/09/2022   Lab Results  Component Value Date   LABMICR 56.6 11/28/2020   MICROALBUR 2.6 (H) 01/31/2022   MICROALBUR 4.1 (H) 11/18/2019       s .   Follow-up: No follow-ups on file.   Sherlene Shams, MD

## 2022-11-11 NOTE — Assessment & Plan Note (Signed)
He continues active surveillance with serial MRI's. most recent MRI dated 12/12/2021 that shows a stable 1.6 cm heterogenously enhancing small right renal mass, unchanged in size from prior imaging. 

## 2022-11-11 NOTE — Assessment & Plan Note (Signed)
Thyroid was underactive in February and Dr Andrey Spearman increasd levothyroixne dose to 125 to 137 mcg daily 205 mcgo n Sundays

## 2022-11-11 NOTE — Assessment & Plan Note (Signed)
Managed with farxiga, metformin and glipizide,  neuropathy is Managed with 1200 mg gabapentin daily in divided dose. Burning pain is intermittent and seems to be aggravated by elevated sugars and extra starch servings .  No changes today

## 2022-11-11 NOTE — Assessment & Plan Note (Signed)
Reviewed findings of prior CT scan today..  Patient is tolerating high potency statin therapy   Lab Results  Component Value Date   CHOL 150 11/09/2022   HDL 34.00 (L) 11/09/2022   LDLCALC 80 11/09/2022   LDLDIRECT 85.0 05/10/2022   TRIG 180.0 (H) 11/09/2022   CHOLHDL 4 11/09/2022

## 2022-11-11 NOTE — Assessment & Plan Note (Signed)
Continue metformin,  Farxigan and Losartan foe early microalbuminuria ,   Lab Results  Component Value Date   HGBA1C 6.3 11/09/2022   Lab Results  Component Value Date   LABMICR 56.6 11/28/2020   MICROALBUR 2.6 (H) 01/31/2022   MICROALBUR 4.1 (H) 11/18/2019

## 2022-11-14 ENCOUNTER — Other Ambulatory Visit: Payer: Self-pay | Admitting: Internal Medicine

## 2022-11-14 ENCOUNTER — Ambulatory Visit: Payer: Self-pay

## 2022-11-14 NOTE — Patient Outreach (Signed)
  Care Coordination   Follow Up Visit Note   11/14/2022 Name: Nathaniel Lane MRN: [id number] DOB: Nov 07, 1945  Nathaniel Lane is a 77 y.o. year old male who sees Darrick Huntsman, Mar Daring, MD for primary care. I spoke with  Nathaniel Lane by phone today.  What matters to the patients health and wellness today?  Patient states he is doing great.  Denies any heart failure symptoms. He reports having recent follow up with cardiologist for dental work clearance. He said his cardiologist feels he is doing great and can follow up in 6 months.   Patient states his right shoulder pain is 8-9 at night.  He states he saw his primary care provider on 11/09/22 and tylenol was recommended and follow up with orthopedic. Patient states he has a follow up with his orthopedic provider on 11/23/22.  Patient verbalized understanding with heart failure symptoms and when to contact his provider.  Patient verbalized understanding of management of shoulder pain.  Patient in agreement with care coordination goals being met. Patient advised to contact RNCM or primary care provider if care coordination follow up needed in the future.    Goals Addressed             This Visit's Progress    COMPLETED: Heart failure education and managemen of right shoulder pain       Interventions Today    Flowsheet Row Most Recent Value  Chronic Disease   Chronic disease during today's visit Congestive Heart Failure (CHF), Other  [right shoulder pain]  General Interventions   General Interventions Discussed/Reviewed General Interventions Reviewed, Doctor Visits  [evaluation of current treatment plan for HF, shoulder pain and patients adherence to plan as established by provider. Assessed for HF symptoms and shoulder pain.]  Doctor Visits Discussed/Reviewed Doctor Visits Reviewed  Annabell Sabal upcoming provider visits.]  Education Interventions   Education Provided Provided Education  Provided Verbal Education On Other  [reviewed heart failure  symptoms.]  Pharmacy Interventions   Pharmacy Dicussed/Reviewed Pharmacy Topics Reviewed  [medications reviewed/ compliance discussed]              SDOH assessments and interventions completed:  No     Care Coordination Interventions:  Yes, provided   Follow up plan: No further intervention required.   Encounter Outcome:  Pt. Visit Completed   George Ina RN,BSN,CCM [facility] Care Coordination [phone] direct line

## 2022-11-14 NOTE — Patient Instructions (Signed)
Visit Information  Thank you for taking time to visit with me today. Please don't hesitate to contact me if I can be of assistance to you.   Following are the goals we discussed today:  Consider monitoring weight at least 3-4 times per week. Remember to monitor for heart failure symptoms:  increase weight gain 3 lbs overnight or 5lbs in a week, increase swelling in feet/ legs/ ankles, shortness of breath. Notify provider for these symptoms.  Remain active daily     Please call the care guide team at [phone] if you need to cancel or reschedule your appointment.   If you are experiencing a Mental Health or Behavioral Health Crisis or need someone to talk to, please call the Suicide and Crisis Lifeline: 988 call 1-800-273-TALK (toll free, 24 hour hotline)  The patient verbalized understanding of instructions, educational materials, and care plan provided today and agreed to receive a mailed copy of patient instructions, educational materials, and care plan.   George Ina RN,BSN,CCM [facility] Care Coordination [phone] direct line

## 2022-11-15 ENCOUNTER — Other Ambulatory Visit: Payer: Self-pay | Admitting: Internal Medicine

## 2022-11-23 DIAGNOSIS — M25511 Pain in right shoulder: Secondary | ICD-10-CM | POA: Diagnosis not present

## 2022-11-23 DIAGNOSIS — M7541 Impingement syndrome of right shoulder: Secondary | ICD-10-CM | POA: Diagnosis not present

## 2022-11-29 ENCOUNTER — Ambulatory Visit
Admission: RE | Admit: 2022-11-29 | Discharge: 2022-11-29 | Disposition: A | Discharge status: Home / Self Care | Payer: PPO | Source: Ambulatory Visit | Attending: Urology | Admitting: Urology

## 2022-11-29 DIAGNOSIS — N2889 Other specified disorders of kidney and ureter: Secondary | ICD-10-CM | POA: Diagnosis not present

## 2022-11-29 DIAGNOSIS — K8020 Calculus of gallbladder without cholecystitis without obstruction: Secondary | ICD-10-CM | POA: Diagnosis not present

## 2022-11-29 MED ORDER — GADOBUTROL 1 MMOL/ML IV SOLN
7.0000 mL | Freq: Once | INTRAVENOUS | Status: AC | PRN
  Administered 2022-11-29: 7 mL via INTRAVENOUS

## 2022-11-30 ENCOUNTER — Ambulatory Visit: Payer: PPO

## 2022-12-06 ENCOUNTER — Other Ambulatory Visit: Payer: Self-pay | Admitting: Internal Medicine

## 2022-12-18 ENCOUNTER — Ambulatory Visit: Payer: PPO | Admitting: Urology

## 2022-12-24 DIAGNOSIS — N1832 Chronic kidney disease, stage 3b: Secondary | ICD-10-CM | POA: Diagnosis not present

## 2022-12-24 DIAGNOSIS — Z85850 Personal history of malignant neoplasm of thyroid: Secondary | ICD-10-CM | POA: Diagnosis not present

## 2022-12-24 DIAGNOSIS — E1122 Type 2 diabetes mellitus with diabetic chronic kidney disease: Secondary | ICD-10-CM | POA: Diagnosis not present

## 2022-12-24 DIAGNOSIS — N2581 Secondary hyperparathyroidism of renal origin: Secondary | ICD-10-CM | POA: Diagnosis not present

## 2022-12-24 DIAGNOSIS — E890 Postprocedural hypothyroidism: Secondary | ICD-10-CM | POA: Diagnosis not present

## 2022-12-24 DIAGNOSIS — I10 Essential (primary) hypertension: Secondary | ICD-10-CM | POA: Diagnosis not present

## 2022-12-27 ENCOUNTER — Other Ambulatory Visit: Payer: Self-pay | Admitting: Family

## 2022-12-28 NOTE — Telephone Encounter (Signed)
Patient is in pain. Would like this medication called in ASAP.

## 2022-12-28 NOTE — Telephone Encounter (Signed)
Refilled: 02/28/2022 Last OV: 11/09/2022 Next OV: not scheduled

## 2022-12-28 NOTE — Telephone Encounter (Signed)
Prescription Request  12/28/2022  LOV: Visit date not found  What is the name of the medication or equipment? gabapentin (NEURONTIN) 300 MG capsule  Have you contacted your pharmacy to request a refill? Yes   Which pharmacy would you like this sent to?   [id number] - [place], [place] - [address] [place] [address] Phone: [phone] Fax: [phone]    Patient notified that their request is being sent to the clinical staff for review and that they should receive a response within 2 business days.   Please advise at [facility] [phone]

## 2022-12-28 NOTE — Telephone Encounter (Signed)
Patient came into office and stated he has been out of his gabapentin (NEURONTIN) 300 MG capsule since Tuesday. He states his feet a really hurting him.

## 2023-02-06 ENCOUNTER — Other Ambulatory Visit: Payer: Self-pay | Admitting: Family Medicine

## 2023-02-06 ENCOUNTER — Other Ambulatory Visit: Payer: Self-pay | Admitting: Internal Medicine

## 2023-02-12 ENCOUNTER — Emergency Department: Payer: PPO

## 2023-02-12 ENCOUNTER — Encounter: Payer: Self-pay | Admitting: Emergency Medicine

## 2023-02-12 ENCOUNTER — Inpatient Hospital Stay: Payer: PPO

## 2023-02-12 ENCOUNTER — Telehealth: Payer: Self-pay | Admitting: Internal Medicine

## 2023-02-12 ENCOUNTER — Inpatient Hospital Stay
Admission: EM | Admit: 2023-02-12 | Discharge: 2023-02-14 | DRG: 175 | Disposition: A | Discharge status: Home / Self Care | Payer: PPO | Attending: Internal Medicine | Admitting: Internal Medicine

## 2023-02-12 ENCOUNTER — Other Ambulatory Visit: Payer: Self-pay

## 2023-02-12 DIAGNOSIS — I2609 Other pulmonary embolism with acute cor pulmonale: Secondary | ICD-10-CM | POA: Diagnosis not present

## 2023-02-12 DIAGNOSIS — Z66 Do not resuscitate: Secondary | ICD-10-CM | POA: Diagnosis present

## 2023-02-12 DIAGNOSIS — Z85118 Personal history of other malignant neoplasm of bronchus and lung: Secondary | ICD-10-CM | POA: Diagnosis not present

## 2023-02-12 DIAGNOSIS — I2510 Atherosclerotic heart disease of native coronary artery without angina pectoris: Secondary | ICD-10-CM | POA: Diagnosis present

## 2023-02-12 DIAGNOSIS — R918 Other nonspecific abnormal finding of lung field: Secondary | ICD-10-CM | POA: Diagnosis not present

## 2023-02-12 DIAGNOSIS — Z79890 Hormone replacement therapy (postmenopausal): Secondary | ICD-10-CM | POA: Diagnosis not present

## 2023-02-12 DIAGNOSIS — M109 Gout, unspecified: Secondary | ICD-10-CM | POA: Diagnosis present

## 2023-02-12 DIAGNOSIS — E1140 Type 2 diabetes mellitus with diabetic neuropathy, unspecified: Secondary | ICD-10-CM | POA: Diagnosis not present

## 2023-02-12 DIAGNOSIS — Z902 Acquired absence of lung [part of]: Secondary | ICD-10-CM

## 2023-02-12 DIAGNOSIS — R911 Solitary pulmonary nodule: Secondary | ICD-10-CM | POA: Diagnosis present

## 2023-02-12 DIAGNOSIS — R042 Hemoptysis: Secondary | ICD-10-CM | POA: Diagnosis not present

## 2023-02-12 DIAGNOSIS — Z87891 Personal history of nicotine dependence: Secondary | ICD-10-CM | POA: Diagnosis not present

## 2023-02-12 DIAGNOSIS — Z85850 Personal history of malignant neoplasm of thyroid: Secondary | ICD-10-CM | POA: Diagnosis not present

## 2023-02-12 DIAGNOSIS — E1169 Type 2 diabetes mellitus with other specified complication: Secondary | ICD-10-CM | POA: Diagnosis not present

## 2023-02-12 DIAGNOSIS — E1121 Type 2 diabetes mellitus with diabetic nephropathy: Secondary | ICD-10-CM | POA: Diagnosis not present

## 2023-02-12 DIAGNOSIS — E785 Hyperlipidemia, unspecified: Secondary | ICD-10-CM | POA: Diagnosis not present

## 2023-02-12 DIAGNOSIS — R599 Enlarged lymph nodes, unspecified: Secondary | ICD-10-CM

## 2023-02-12 DIAGNOSIS — Z7984 Long term (current) use of oral hypoglycemic drugs: Secondary | ICD-10-CM | POA: Diagnosis not present

## 2023-02-12 DIAGNOSIS — I2699 Other pulmonary embolism without acute cor pulmonale: Principal | ICD-10-CM | POA: Diagnosis present

## 2023-02-12 DIAGNOSIS — Z8616 Personal history of COVID-19: Secondary | ICD-10-CM | POA: Diagnosis not present

## 2023-02-12 DIAGNOSIS — I252 Old myocardial infarction: Secondary | ICD-10-CM

## 2023-02-12 DIAGNOSIS — E890 Postprocedural hypothyroidism: Secondary | ICD-10-CM | POA: Diagnosis present

## 2023-02-12 DIAGNOSIS — N400 Enlarged prostate without lower urinary tract symptoms: Secondary | ICD-10-CM | POA: Diagnosis present

## 2023-02-12 DIAGNOSIS — I700 Atherosclerosis of aorta: Secondary | ICD-10-CM | POA: Diagnosis not present

## 2023-02-12 DIAGNOSIS — I10 Essential (primary) hypertension: Secondary | ICD-10-CM | POA: Diagnosis present

## 2023-02-12 DIAGNOSIS — E1129 Type 2 diabetes mellitus with other diabetic kidney complication: Secondary | ICD-10-CM | POA: Diagnosis present

## 2023-02-12 DIAGNOSIS — Z9221 Personal history of antineoplastic chemotherapy: Secondary | ICD-10-CM | POA: Diagnosis not present

## 2023-02-12 DIAGNOSIS — F411 Generalized anxiety disorder: Secondary | ICD-10-CM | POA: Diagnosis not present

## 2023-02-12 DIAGNOSIS — Z8249 Family history of ischemic heart disease and other diseases of the circulatory system: Secondary | ICD-10-CM

## 2023-02-12 DIAGNOSIS — R591 Generalized enlarged lymph nodes: Secondary | ICD-10-CM | POA: Diagnosis not present

## 2023-02-12 DIAGNOSIS — J9601 Acute respiratory failure with hypoxia: Secondary | ICD-10-CM

## 2023-02-12 DIAGNOSIS — Z880 Allergy status to penicillin: Secondary | ICD-10-CM

## 2023-02-12 DIAGNOSIS — Z951 Presence of aortocoronary bypass graft: Secondary | ICD-10-CM

## 2023-02-12 DIAGNOSIS — Z79899 Other long term (current) drug therapy: Secondary | ICD-10-CM

## 2023-02-12 DIAGNOSIS — Z86711 Personal history of pulmonary embolism: Secondary | ICD-10-CM

## 2023-02-12 DIAGNOSIS — R0602 Shortness of breath: Secondary | ICD-10-CM | POA: Diagnosis not present

## 2023-02-12 DIAGNOSIS — Z7982 Long term (current) use of aspirin: Secondary | ICD-10-CM

## 2023-02-12 LAB — PROTIME-INR
INR: 1.3 — ABNORMAL HIGH (ref 0.8–1.2)
Prothrombin Time: 16.1 seconds — ABNORMAL HIGH (ref 11.4–15.2)

## 2023-02-12 LAB — BASIC METABOLIC PANEL
Anion gap: 11 (ref 5–15)
BUN: 31 mg/dL — ABNORMAL HIGH (ref 8–23)
CO2: 23 mmol/L (ref 22–32)
Calcium: 8.4 mg/dL — ABNORMAL LOW (ref 8.9–10.3)
Chloride: 99 mmol/L (ref 98–111)
Creatinine, Ser: 1.91 mg/dL — ABNORMAL HIGH (ref 0.61–1.24)
GFR, Estimated: 36 mL/min — ABNORMAL LOW (ref >60)
Glucose, Bld: 148 mg/dL — ABNORMAL HIGH (ref 70–99)
Potassium: 3.7 mmol/L (ref 3.5–5.1)
Sodium: 133 mmol/L — ABNORMAL LOW (ref 135–145)

## 2023-02-12 LAB — CBC
HCT: 36.8 % — ABNORMAL LOW (ref 39.0–52.0)
Hemoglobin: 12 g/dL — ABNORMAL LOW (ref 13.0–17.0)
MCH: 29.7 pg (ref 26.0–34.0)
MCHC: 32.6 g/dL (ref 30.0–36.0)
MCV: 91.1 fL (ref 80.0–100.0)
Platelets: 201 10*3/uL (ref 150–400)
RBC: 4.04 MIL/uL — ABNORMAL LOW (ref 4.22–5.81)
RDW: 12.6 % (ref 11.5–15.5)
WBC: 10.6 10*3/uL — ABNORMAL HIGH (ref 4.0–10.5)
nRBC: 0 % (ref 0.0–0.2)

## 2023-02-12 LAB — CBG MONITORING, ED: Glucose-Capillary: 111 mg/dL — ABNORMAL HIGH (ref 70–99)

## 2023-02-12 LAB — APTT: aPTT: 40 seconds — ABNORMAL HIGH (ref 24–36)

## 2023-02-12 LAB — TROPONIN I (HIGH SENSITIVITY)
Troponin I (High Sensitivity): 86 ng/L — ABNORMAL HIGH (ref <18)
Troponin I (High Sensitivity): 76 ng/L — ABNORMAL HIGH (ref <18)

## 2023-02-12 MED ORDER — LEVOTHYROXINE SODIUM 137 MCG PO TABS
137.0000 ug | ORAL_TABLET | Freq: Every day | ORAL | Status: DC
  Administered 2023-02-13 – 2023-02-14 (×2): 137 ug via ORAL
  Filled 2023-02-12 (×2): qty 1

## 2023-02-12 MED ORDER — INSULIN ASPART 100 UNIT/ML IJ SOLN
0.0000 [IU] | Freq: Three times a day (TID) | INTRAMUSCULAR | Status: DC
  Administered 2023-02-13: 1 [IU] via SUBCUTANEOUS
  Administered 2023-02-13: 2 [IU] via SUBCUTANEOUS
  Administered 2023-02-13 – 2023-02-14 (×2): 1 [IU] via SUBCUTANEOUS
  Administered 2023-02-14: 2 [IU] via SUBCUTANEOUS
  Filled 2023-02-12 (×5): qty 1

## 2023-02-12 MED ORDER — HEPARIN (PORCINE) 25000 UT/250ML-% IV SOLN
1600.0000 [IU]/h | INTRAVENOUS | Status: DC
  Administered 2023-02-12: 1300 [IU]/h via INTRAVENOUS
  Administered 2023-02-13: 1550 [IU]/h via INTRAVENOUS
  Administered 2023-02-14: 1700 [IU]/h via INTRAVENOUS
  Filled 2023-02-12 (×3): qty 250

## 2023-02-12 MED ORDER — HEPARIN BOLUS VIA INFUSION
4000.0000 [IU] | Freq: Once | INTRAVENOUS | Status: AC
  Administered 2023-02-12: 4000 [IU] via INTRAVENOUS
  Filled 2023-02-12: qty 4000

## 2023-02-12 MED ORDER — INSULIN ASPART 100 UNIT/ML IJ SOLN: 0.0000 [IU] | Freq: Every day | INTRAMUSCULAR | Status: DC

## 2023-02-12 MED ORDER — ONDANSETRON HCL 4 MG/2ML IJ SOLN: 4.0000 mg | Freq: Four times a day (QID) | INTRAMUSCULAR | Status: DC | PRN

## 2023-02-12 MED ORDER — ACETAMINOPHEN 650 MG RE SUPP: 650.0000 mg | Freq: Four times a day (QID) | RECTAL | Status: DC | PRN

## 2023-02-12 MED ORDER — SENNOSIDES-DOCUSATE SODIUM 8.6-50 MG PO TABS: 1.0000 | ORAL_TABLET | Freq: Every evening | ORAL | Status: DC | PRN

## 2023-02-12 MED ORDER — ACETAMINOPHEN 325 MG PO TABS: 650.0000 mg | ORAL_TABLET | Freq: Four times a day (QID) | ORAL | Status: DC | PRN

## 2023-02-12 MED ORDER — IOHEXOL 350 MG/ML SOLN
60.0000 mL | Freq: Once | INTRAVENOUS | Status: AC | PRN
  Administered 2023-02-12: 60 mL via INTRAVENOUS

## 2023-02-12 MED ORDER — LEVOFLOXACIN IN D5W 750 MG/150ML IV SOLN
750.0000 mg | Freq: Once | INTRAVENOUS | Status: AC
  Administered 2023-02-12: 750 mg via INTRAVENOUS
  Filled 2023-02-12: qty 150

## 2023-02-12 MED ORDER — LORAZEPAM 0.5 MG PO TABS: 0.5000 mg | ORAL_TABLET | Freq: Four times a day (QID) | ORAL | Status: AC | PRN

## 2023-02-12 MED ORDER — ONDANSETRON HCL 4 MG PO TABS: 4.0000 mg | ORAL_TABLET | Freq: Four times a day (QID) | ORAL | Status: DC | PRN

## 2023-02-12 NOTE — Assessment & Plan Note (Signed)
-   Insulin SSI with at bedtime coverage ordered °

## 2023-02-12 NOTE — Consult Note (Signed)
ANTICOAGULATION CONSULT NOTE  Pharmacy Consult for IV Heparin Indication: pulmonary embolus  Patient Measurements: Height: 5\' 9"  (175.3 cm) IBW/kg (Calculated) : 70.7 Heparin Dosing Weight: 79.2  Labs: Recent Labs    02/12/23 1739  HGB 12.0*  HCT 36.8*  PLT 201  CREATININE 1.91*  TROPONINIHS 86*   CrCl cannot be calculated (Unknown ideal weight.).  Medical History: Past Medical History:  Diagnosis Date   Cancer (HCC) 2012   Stage IIA squamous cell Lung Ca   CHF (congestive heart failure) (HCC) 01/28/2017   Diabetes mellitus    Gout    Hyperlipidemia    Hypertension    Myocardial infarction [facility])    Thyroid cancer (HCC) 2013   s/p thyroidectomy 2014    Medications:  No anticoagulation prior to admission per my chart review. Medication reconciliation is pending  Assessment: 77 y/o M with medical history as above presenting with shortness of breath and hemoptysis. Pharmacy consulted to initiate heparin infusion for PE.   Baseline aPTT 40s (elevated), INR 1.3 (elevated). Baseline H&H acceptable.  Goal of Therapy:  Heparin level 0.3-0.7 units/ml Monitor platelets by anticoagulation protocol: Yes   Plan:  --Heparin 4000 unit IV bolus followed by continuous infusion at 1300 units/hr --Heparin level 8 hours from initiation of infusion --Daily CBC per protocol while on IV heparin  Tressie Ellis 02/12/2023,7:38 PM

## 2023-02-12 NOTE — Assessment & Plan Note (Signed)
Status post right-sided thoracotomy and lower lobectomy in 09/2011 History of mediastinal lymphadenectomy Status post chemotherapy Concern of recurrence-pending oncology evaluation

## 2023-02-12 NOTE — Telephone Encounter (Signed)
Spoke with pt and he stated that he has been taking a lot of Advil about 7 to 8 tablets daily for his arm. Pt is also taking Aspirin 81 mg daily. Pt stated that for the last two days he has coughed up some blood and then today he felt like he was going to vomit. When he did vomit it was just a bunch of blood. Pt stated that since he has done that he seems to feel better. The front desk scheduled him to see Kara Dies on Thursday.

## 2023-02-12 NOTE — ED Provider Notes (Signed)
[facility] Provider Note    Event Date/Time   First MD Initiated Contact with Patient 02/12/23 1845     (approximate)   History   Hemoptysis and Shortness of Breath   HPI  Nathaniel Lane is a 77 y.o. male with a history of remote lung cancer status post partial lobectomy as well as history of PE not currently on anticoagulation presents to the ER for evaluation of worsening shortness of breath for the past several days as well as hemoptysis.  Has been taking Motrin.  No measured temperature fevers.  Has been having productive cough with this.     Physical Exam   Triage Vital Signs: ED Triage Vitals  Encounter Vitals Group     BP 02/12/23 1735 128/71     Systolic BP Percentile --      Diastolic BP Percentile --      Pulse Rate 02/12/23 1735 91     Resp 02/12/23 1735 18     Temp 02/12/23 1735 98.4 F (36.9 C)     Temp Source 02/12/23 1735 Oral     SpO2 02/12/23 1735 97 %     Weight --      Height 02/12/23 1732 5\' 9"  (1.753 m)     Head Circumference --      Peak Flow --      Pain Score 02/12/23 1732 0     Pain Loc --      Pain Education --      Exclude from Growth Chart --     Most recent vital signs: Vitals:   02/12/23 1735  BP: 128/71  Pulse: 91  Resp: 18  Temp: 98.4 F (36.9 C)  SpO2: 97%     Constitutional: Alert  Eyes: Conjunctivae are normal.  Head: Atraumatic. Nose: No congestion/rhinnorhea. Mouth/Throat: Mucous membranes are moist.   Neck: Painless ROM.  Cardiovascular:   Good peripheral circulation. Respiratory: Normal respiratory effort.  No retractions.  Gastrointestinal: Soft and nontender.  Musculoskeletal:  no deformity Neurologic:  MAE spontaneously. No gross focal neurologic deficits are appreciated.  Skin:  Skin is warm, dry and intact. No rash noted. Psychiatric: Mood and affect are normal. Speech and behavior are normal.    ED Results / Procedures / Treatments   Labs (all labs ordered are listed, but  only abnormal results are displayed) Labs Reviewed  BASIC METABOLIC PANEL - Abnormal; Notable for the following components:      Result Value   Sodium 133 (*)    Glucose, Bld 148 (*)    BUN 31 (*)    Creatinine, Ser 1.91 (*)    Calcium 8.4 (*)    GFR, Estimated 36 (*)    All other components within normal limits  CBC - Abnormal; Notable for the following components:   WBC 10.6 (*)    RBC 4.04 (*)    Hemoglobin 12.0 (*)    HCT 36.8 (*)    All other components within normal limits  TROPONIN I (HIGH SENSITIVITY) - Abnormal; Notable for the following components:   Troponin I (High Sensitivity) 86 (*)    All other components within normal limits  CULTURE, BLOOD (ROUTINE X 2)  CULTURE, BLOOD (ROUTINE X 2)  APTT  PROTIME-INR  CBC  TROPONIN I (HIGH SENSITIVITY)     EKG  ED ECG REPORT I, Willy Eddy, the attending physician, personally viewed and interpreted this ECG.   Date: 02/12/2023  EKG Time: 17:36  Rate: 96  Rhythm:  sinus  Axis: normal  Intervals: normal  ST&T Change: no stemi, no depressions    RADIOLOGY Please see ED Course for my review and interpretation.  I personally reviewed all radiographic images ordered to evaluate for the above acute complaints and reviewed radiology reports and findings.  These findings were personally discussed with the patient.  Please see medical record for radiology report.    PROCEDURES:  Critical Care performed: Yes, see critical care procedure note(s)  .Critical Care  Performed by: Willy Eddy, MD Authorized by: Willy Eddy, MD   Critical care provider statement:    Critical care time (minutes):  35   Critical care was necessary to treat or prevent imminent or life-threatening deterioration of the following conditions:  Respiratory failure   Critical care was time spent personally by me on the following activities:  Ordering and performing treatments and interventions, ordering and review of laboratory  studies, ordering and review of radiographic studies, pulse oximetry, re-evaluation of patient's condition, review of old charts, obtaining history from patient or surrogate, examination of patient, evaluation of patient's response to treatment, discussions with primary provider, discussions with consultants and development of treatment plan with patient or surrogate    MEDICATIONS ORDERED IN ED: Medications  levofloxacin (LEVAQUIN) IVPB 750 mg (has no administration in time range)  iohexol (OMNIPAQUE) 350 MG/ML injection 60 mL (60 mLs Intravenous Contrast Given 02/12/23 1906)     IMPRESSION / MDM / ASSESSMENT AND PLAN / ED COURSE  I reviewed the triage vital signs and the nursing notes.                              Differential diagnosis includes, but is not limited to, Asthma, copd, CHF, pna, ptx, malignancy, Pe, anemia  Patient presenting to the ER for evaluation of symptoms as described above.  Based on symptoms, risk factors and considered above differential, this presenting complaint could reflect a potentially life-threatening illness therefore the patient will be placed on continuous pulse oximetry and telemetry for monitoring.  Laboratory evaluation will be sent to evaluate for the above complaints.     Clinical Course as of 02/12/23 1941  Tue Feb 12, 2023  1854 Chest x-ray on my review and interpretation with evidence of scarring the right lower lobe may be postoperative.  Given patient's hypoxia hemoptysis will order CTA to further evaluate for the blood differential. [PR]  1933 CTA my review and interpretation with concern for PE.  Will order heparin. [PR]  1940 Etiology called in consultation stating there are findings for recurrence of previous malignancy as well as concern for consolidation suggesting pneumonia as well.  In addition to starting IV heparin will order blood cultures as well as start IV antibiotics.  Will consult hospitalist for admission. [PR]    Clinical  Course User Index [PR] Willy Eddy, MD     FINAL CLINICAL IMPRESSION(S) / ED DIAGNOSES   Final diagnoses:  Acute respiratory failure with hypoxia (HCC)  Other acute pulmonary embolism, unspecified whether acute cor pulmonale present (HCC)     Rx / DC Orders   ED Discharge Orders     None        Note:  This document was prepared using Dragon voice recognition software and may include unintentional dictation errors.    Willy Eddy, MD 02/12/23 [phone]

## 2023-02-12 NOTE — Assessment & Plan Note (Addendum)
No CT evidence of right heart strain.  Lower extremity venous Doppler negative for DVT. Heparin per pharmacy Oxygen supplementation to maintain SpO2 greater than 92%

## 2023-02-12 NOTE — ED Triage Notes (Addendum)
Pt via POV from home. Pt c/o SOB and hemoptysis since Sunday. States he has a hx of lung cancer a couple of years back. Denies any chest pain. States he has been taking approx 8 pills of Advil a day but unknown how many mg. Pt SOB with exertion. Pt is A&Ox4 and NAD.   Pt to triage room. Initial O2  sat 87% on RA, placed pt on 2L [place], increased to 97%

## 2023-02-12 NOTE — Assessment & Plan Note (Signed)
Secondary to new right sided PE, treat per above

## 2023-02-12 NOTE — Telephone Encounter (Signed)
Spoke with pt and he gave a verbal understanding. Pt stated he was on his way.

## 2023-02-12 NOTE — Assessment & Plan Note (Signed)
Lorazepam 0.5 mg p.o. every 6 hours as needed for anxiety, 12 hours of coverage ordered

## 2023-02-12 NOTE — Assessment & Plan Note (Signed)
Per CTA chest read: Interval increased size of subcarinal and right hilar lymph nodes, could be reactive although metastatic disease can not be excluded. New large subpleural nodule of the superior portion of the left lower lobe, concerning for metastatic disease.  Patient formally saw Dr. Orlie Dakin and is requesting Dr. Orlie Dakin to be consulted Staff message sent to Dr. Orlie Dakin along with Epic order placement

## 2023-02-12 NOTE — Consult Note (Signed)
PHARMACY -  BRIEF ANTIBIOTIC NOTE   Pharmacy has received consult(s) for levofloxacin from an ED provider.  The patient's profile has been reviewed for ht/wt/allergies/indication/available labs.    Penicillin allergy = mouth swelling. No apparent history of receipt of cephalosporins within our health system.  One time order(s) placed for  --Levofloxacin 750 mg IV x 1  Further antibiotics/pharmacy consults should be ordered by admitting physician if indicated.                       Thank you, Tressie Ellis 02/12/2023  8:15 PM

## 2023-02-12 NOTE — Assessment & Plan Note (Signed)
-   Levothyroxine 137 mcg daily before breakfast resumed ?

## 2023-02-12 NOTE — Telephone Encounter (Signed)
Pt called in stating that he's out of town and he's been taking a lot of tylenol and now he stated he's been spitting up blood. He was wondering if this is because of the Tylenol? Pt would like a call back. I put him on scheduled to see Nathaniel Croon NP on Thursday 02/14/23.

## 2023-02-12 NOTE — Assessment & Plan Note (Addendum)
Status post total thyroidectomy with central lymph node dissection in March 2014 Status post R-131 in July 2014

## 2023-02-12 NOTE — H&P (Signed)
History and Physical   Nathaniel Lane WUJ:[id number] DOB: [birth year] DOA: 02/12/2023  PCP: Sherlene Shams, MD  Outpatient Specialists: Dr. Carlena Sax, endocrinologist Patient coming from: Home  I have personally briefly reviewed patient's old medical records in [facility] EMR.  Chief Concern: Coughing up blood  HPI: Nathaniel Lane is a 77 year old male with history of neuropathy, non-insulin-dependent diabetes mellitus, hypothyroid, hypertension, GERD, BPH, hyperlipidemia, who presents to the emergency department for chief concerns of coughing up blood.  Vitals in the ED showed temperature 98.4, respiration rate of 18, heart rate of 91, blood pressure 128/71, SpO2 of 97% on 2 L nasal cannula.  Serum serum sodium is 133, potassium 3.7, chloride 99, bicarb 23, BUN of 31, serum creatinine 1.91, EGFR 36, nonfasting blood glucose 148, WBC 10.6, hemoglobin 12, platelets of 201.  CTA PE was read as right-sided pulmonary embolism.  New consolidations in posterior right sided upper and middle lobes, middle lobes worse than upper lobe.  Interval increased size of subcarinal and right hilar lymph nodes, could be reactive although metastatic disease can not be excluded. Recommend attention on follow-up.  New large subpleural nodule of the superior portion of the left lower lobe, concerning for metastatic disease. Recommend attention on follow-up.   ED treatment: Levofloxacin 750 mg IV one-time dose.  Heparin per pharmacy --------------------------- At bedside patient was able to tell me his name, age, location, current calendar year.  He does not appear to be in acute distress.  He reports that he started coughing up blood today.  This is since resolved.  He also endorsed some shortness of breath.  He reports that currently he does not feel shortness of breath any longer but that his symptoms has improved significantly.  He denies chest pain, dysuria, hematuria, diarrhea, abdominal pain, trauma to  his person, swelling of his lower extremities.  He denies history of blood clots.  He denies changes to his weight including gain or loss.  Social history: He lives at home.  He denies tobacco, EtOH, recreational drug use.  He still works as a Hospital doctor for [employer].  He just recently dropped out of a car to [employer] mother in [place].  ROS: Constitutional: no weight change, no fever ENT/Mouth: no sore throat, no rhinorrhea Eyes: no eye pain, no vision changes Cardiovascular: no chest pain, no dyspnea,  no edema, no palpitations Respiratory: + cough, no sputum, no wheezing Gastrointestinal: no nausea, no vomiting, no diarrhea, no constipation Genitourinary: no urinary incontinence, no dysuria, no hematuria Musculoskeletal: no arthralgias, no myalgias Skin: no skin lesions, no pruritus, Neuro: no weakness, no loss of consciousness, no syncope Psych: no anxiety, no depression, no decrease appetite Heme/Lymph: no bruising, no bleeding  ED Course: Discussed with emergency medicine provider, patient requiring hospitalization for chief concerns of acute hypoxic respiratory failure secondary to new right PE.  Assessment/Plan  Principal Problem:   Right pulmonary embolus (HCC) Active Problems:   History of lung cancer   History of thyroid cancer   Acute hypoxic respiratory failure (HCC)   Hyperlipidemia associated with type 2 diabetes mellitus (HCC)   Postsurgical hypothyroidism   Personal history of COVID-19   Type 2 diabetes mellitus, controlled, with renal complications (HCC)   Anxiety state   CAD (coronary artery disease)   Aortic atherosclerosis (HCC)   Type 2 diabetes mellitus with diabetic neuropathy, unspecified (HCC)   Lymph nodes enlarged   Assessment and Plan:  * Right pulmonary embolus (HCC) Heparin per pharmacy Oxygen supplementation  to maintain SpO2 greater than 92% Ultrasound of bilateral lower extremity ordered to assess for DVT Complete echo  ordered I called reading radiologist, per reading radiologist the RV/LV ratio was within normal limits; vascular specialist not consulted on admission  Acute hypoxic respiratory failure (HCC) Secondary to new right sided PE, treat per above  History of thyroid cancer Status post total thyroidectomy with central lymph node dissection in March 2014 Status post R-131 in July 2014  History of lung cancer Status post right-sided thoracotomy and lower lobectomy in 09/2011 History of mediastinal lymphadenectomy Status post chemotherapy  Personal history of COVID-19 In 2021  Postsurgical hypothyroidism Levothyroxine 137 mcg daily before breakfast resumed  Lymph nodes enlarged Per CTA chest read: Interval increased size of subcarinal and right hilar lymph nodes, could be reactive although metastatic disease can not be excluded. New large subpleural nodule of the superior portion of the left lower lobe, concerning for metastatic disease.  Patient formally saw Dr. Orlie Dakin and is requesting Dr. Orlie Dakin to be consulted Staff message sent to Dr. Orlie Dakin along with Epic order placement  Anxiety state Lorazepam 0.5 mg p.o. every 6 hours as needed for anxiety, 12 hours of coverage ordered  Type 2 diabetes mellitus, controlled, with renal complications (HCC) Insulin SSI with at bedtime coverage ordered  Chart reviewed.   DVT prophylaxis: Heparin per pharmacy Code Status: DNR, patient states he does not want chest compression, ribs broken Diet: Heart healthy Family Communication: A phone call was offered, patient declined stating that he already told his daughter he is being admitted to the hospital for blood clot Disposition Plan: Pending clinical course Consults called: Medical oncology Admission status: Telemetry cardiac, inpatient  Past Medical History:  Diagnosis Date   Cancer (HCC) 2012   Stage IIA squamous cell Lung Ca   CHF (congestive heart failure) (HCC) 01/28/2017   Diabetes  mellitus    Gout    Hyperlipidemia    Hypertension    Myocardial infarction [facility])    Thyroid cancer (HCC) 2013   s/p thyroidectomy 2014   Past Surgical History:  Procedure Laterality Date   COLONOSCOPY N/A 04/25/2016   Procedure: COLONOSCOPY;  Surgeon: Scot Jun, MD;  Location: [facility] ENDOSCOPY;  Service: Endoscopy;  Laterality: N/A;  Diabetic (oral meds)   COLONOSCOPY WITH PROPOFOL N/A 03/22/2021   Procedure: COLONOSCOPY WITH PROPOFOL;  Surgeon: Toney Reil, MD;  Location: [facility] ENDOSCOPY;  Service: Gastroenterology;  Laterality: N/A;   COLONOSCOPY WITH PROPOFOL N/A 06/29/2022   Procedure: COLONOSCOPY WITH PROPOFOL;  Surgeon: Jaynie Collins, DO;  Location: [facility] ENDOSCOPY;  Service: Gastroenterology;  Laterality: N/A;   CORONARY ARTERY BYPASS GRAFT  2007   LUNG LOBECTOMY     TOTAL THYROIDECTOMY N/A    Social History:  reports that he quit smoking about 17 years ago. His smoking use included cigarettes. He has been exposed to tobacco smoke. He has never used smokeless tobacco. He reports that he does not drink alcohol and does not use drugs.  Allergies  Allergen Reactions   Penicillins Swelling    Swelling in mouth Swelling in mouth Swelling in mouth   Family History  Problem Relation Age of Onset   Hypertension Other    Heart disease Father    Family history: Family history reviewed and not pertinent.  Prior to Admission medications   Medication Sig Start Date End Date Taking? Authorizing Provider  aspirin EC (ASPIRIN LOW DOSE) 81 MG tablet TAKE 1 TABLET (81 MG TOTAL) BY MOUTH DAILY. SWALLOW WHOLE.  02/06/23   Sherlene Shams, MD  atorvastatin (LIPITOR) 20 MG tablet TAKE 1 TABLET BY MOUTH EVERY DAY 09/17/22   Sherlene Shams, MD  atorvastatin (LIPITOR) 40 MG tablet TAKE 1 TABLET BY MOUTH EVERY DAY 12/06/22   Sherlene Shams, MD  blood glucose meter kit and supplies KIT Dispense based on patient and insurance preference. Use up to four times daily as directed. (FOR  ICD-10 E11.29). 02/11/18   Sherlene Shams, MD  FARXIGA 10 MG TABS tablet TAKE 1 TABLET BY MOUTH EVERY DAY 06/01/22   Sherlene Shams, MD  gabapentin (NEURONTIN) 300 MG capsule TAKE 1 CAPSULE BY MOUTH FOUR TIMES A DAY 02/07/23   Sherlene Shams, MD  glipiZIDE (GLUCOTROL XL) 5 MG 24 hr tablet TAKE 1 TABLET BY MOUTH EVERY DAY WITH BREAKFAST 11/20/22   Sherlene Shams, MD  glipiZIDE (GLUCOTROL) 5 MG tablet TAKE 1 TABLET (5 MG TOTAL) BY MOUTH DAILY BEFORE SUPPER. 05/07/22   Sherlene Shams, MD  levothyroxine (SYNTHROID) 137 MCG tablet Take 137 mcg by mouth daily before breakfast.    [provider]  losartan (COZAAR) 50 MG tablet TAKE 1 TABLET BY MOUTH EVERY DAY 08/28/22   Sherlene Shams, MD  metFORMIN (GLUCOPHAGE-XR) 750 MG 24 hr tablet TAKE 1 TABLET BY MOUTH EVERY DAY WITH BREAKFAST 02/06/23   Sherlene Shams, MD  omeprazole (PRILOSEC) 20 MG capsule Take 20 mg by mouth daily. Patient not taking: Reported on 09/10/2022 12/29/20   [provider]  [facility] DELICA LANCETS FINE MISC USE AS DIRECTED 4 TIMES A DAY 03/06/18   Sherlene Shams, MD  [facility] ULTRA test strip USE AS DIRECTED 4 TIMES A DAY 11/14/22   Sherlene Shams, MD  tamsulosin (FLOMAX) 0.4 MG CAPS capsule Take 1 capsule (0.4 mg total) by mouth daily. 10/08/22   Sherlene Shams, MD  tiZANidine (ZANAFLEX) 4 MG capsule Take 4 mg by mouth at bedtime.    [provider]   Physical Exam: Vitals:   02/12/23 1958 02/12/23 2045 02/12/23 2100 02/12/23 2218  BP:  118/85 125/64 124/72  Pulse:  78 74 72  Resp:  20 (!) 22 20  Temp:    98 F (36.7 C)  TempSrc:    Oral  SpO2:  100% 100% 97%  Weight: 79.2 kg     Height:       Constitutional: appears age-appropriate, NAD, calm Eyes: PERRL, lids and conjunctivae normal ENMT: Mucous membranes are moist. Posterior pharynx clear of any exudate or lesions. Age-appropriate dentition. Hearing appropriate Neck: normal, supple, no masses, no thyromegaly Respiratory: clear to auscultation  bilaterally, no wheezing, no crackles. Normal respiratory effort. No accessory muscle use.  Cardiovascular: Regular rate and rhythm, no murmurs / rubs / gallops. No extremity edema. 2+ pedal pulses. No carotid bruits.  Abdomen: no tenderness, no masses palpated, no hepatosplenomegaly. Bowel sounds positive.  Musculoskeletal: no clubbing / cyanosis. No joint deformity upper and lower extremities. Good ROM, no contractures, no atrophy. Normal muscle tone.  Skin: no rashes, lesions, ulcers. No induration Neurologic: Sensation intact. Strength 5/5 in all 4.  Psychiatric: Normal judgment and insight. Alert and oriented x 3. Normal mood.   EKG: independently reviewed, showing sinus rhythm with rate of 96, QTc 419  Chest x-ray on Admission: I personally reviewed and I agree with radiologist reading as below.  CT Angio Chest PE W and/or Wo Contrast  Addendum Date: 02/12/2023   ADDENDUM REPORT: 02/12/2023 20:24 ADDENDUM: No evidence of  right heart strain.  RV to LV ratio is 0.7. Electronically Signed   By: Allegra Lai M.D.   On: 02/12/2023 20:24   Result Date: 02/12/2023 CLINICAL DATA:  Shortness of breath and hemoptysis, history of lung cancer; * Tracking Code: BO * EXAM: CT ANGIOGRAPHY CHEST WITH CONTRAST TECHNIQUE: Multidetector CT imaging of the chest was performed using the standard protocol during bolus administration of intravenous contrast. Multiplanar CT image reconstructions and MIPs were obtained to evaluate the vascular anatomy. RADIATION DOSE REDUCTION: This exam was performed according to the departmental dose-optimization program which includes automated exposure control, adjustment of the mA and/or kV according to patient size and/or use of iterative reconstruction technique. CONTRAST:  60mL OMNIPAQUE IOHEXOL 350 MG/ML SOLN COMPARISON:  Chest CT dated February 11, 2018 FINDINGS: Cardiovascular: Large filling defect of the right interlobar pulmonary artery. Mild cardiomegaly. Pericardial  effusion. Normal caliber thoracic aorta with moderate atherosclerotic disease. Severe coronary artery calcifications. Mediastinum/Nodes: Esophagus is unremarkable. Prior thyroidectomy. Interval increased size of subcarinal and right hilar lymph nodes reference subcarinal lymph node measuring 10 mm in short axis on series 5 image 51, previously 7 mm. Lungs/Pleura: Moderate emphysema. Prior right lower lobectomy. New consolidations of the posterior right upper lobe and middle lobe, most severe in the right middle lobe. Stable small complex appearing pleural collection of the lower right lung. Left pleural thickening and calcifications unchanged when compared with the prior exam. New subpleural nodule of the superior portion of the left lower lobe measuring 1.8 x 1.5 cm on series 6, image 64. Stable pleural thickening of the posterior portion of the left upper lobe, likely scarring. Upper Abdomen: Cholelithiasis. Left adrenal gland nodule measuring 1.6 cm on series 5, image 304, consistent with benign adenoma. No acute abnormality. Musculoskeletal: No chest wall abnormality. No acute or significant osseous findings. Review of the MIP images confirms the above findings. IMPRESSION: 1. Pulmonary embolus of the right interlobar pulmonary artery. 2. Prior right lower lobectomy. New consolidations of the posterior right upper lobe and middle lobe, most severe in the right middle lobe. Findings may be due to infection, although a component of local recurrence is also possible. Recommend PET-CT follow-up after resolution of acute symptoms. 3. Interval increased size of subcarinal and right hilar lymph nodes, could be reactive although metastatic disease can not be excluded. Recommend attention on follow-up. 4. New large subpleural nodule of the superior portion of the left lower lobe, concerning for metastatic disease. Recommend attention on follow-up. 5. Coronary artery calcifications, aortic Atherosclerosis (ICD10-I70.0)  and Emphysema (ICD10-J43.9). Critical Value/emergent results (pulmonary embolus) were called by telephone at the time of interpretation on 02/12/2023 at 7:39 pm to provider Willy Eddy , who verbally acknowledged these results. Electronically Signed: By: Allegra Lai M.D. On: 02/12/2023 19:45   DG Chest 2 View  Result Date: 02/12/2023 CLINICAL DATA:  Shortness of breath EXAM: CHEST - 2 VIEW COMPARISON:  Chest x-ray dated May 10, 2022 FINDINGS: Cardiac and mediastinal contours are unchanged. Status post median sternotomy. Right chest wall port. New right lower lung consolidation. Unchanged blunting of the bilateral costophrenic angles. No evidence of pneumothorax. IMPRESSION: New right lower lung consolidation, concerning for pneumonia. Recommend follow-up PA and lateral chest radiograph in 6-8 weeks to ensure resolution. Electronically Signed   By: Allegra Lai M.D.   On: 02/12/2023 19:09    Labs on Admission: I have personally reviewed following labs  CBC: Recent Labs  Lab 02/12/23 1739  WBC 10.6*  HGB 12.0*  HCT 36.8*  MCV 91.1  PLT 201   Basic Metabolic Panel: Recent Labs  Lab 02/12/23 1739  NA 133*  K 3.7  CL 99  CO2 23  GLUCOSE 148*  BUN 31*  CREATININE 1.91*  CALCIUM 8.4*   GFR: Estimated Creatinine Clearance: 33.4 mL/min (A) (by C-G formula based on SCr of 1.91 mg/dL (H)).  Coagulation Profile: Recent Labs  Lab 02/12/23 1739  INR 1.3*   Urine analysis:    Component Value Date/Time   COLORURINE YELLOW (A) 05/25/2022 0927   APPEARANCEUR CLEAR (A) 05/25/2022 0927   APPEARANCEUR Clear 01/29/2012 1912   LABSPEC 1.011 05/25/2022 0927   LABSPEC 1.019 01/29/2012 1912   PHURINE 5.0 05/25/2022 0927   GLUCOSEU >=500 (A) 05/25/2022 0927   GLUCOSEU >=1000 (A) 03/02/2021 0915   HGBUR SMALL (A) 05/25/2022 0927   BILIRUBINUR NEGATIVE 05/25/2022 0927   BILIRUBINUR Negative 01/29/2012 1912   KETONESUR NEGATIVE 05/25/2022 0927   PROTEINUR 30 (A) 05/25/2022  0927   UROBILINOGEN 0.2 03/02/2021 0915   NITRITE NEGATIVE 05/25/2022 0927   LEUKOCYTESUR NEGATIVE 05/25/2022 0927   LEUKOCYTESUR Negative 01/29/2012 1912   This document was prepared using Dragon Voice Recognition software and may include unintentional dictation errors.  Dr. Sedalia Muta [facility]  If 7PM-7AM, please contact overnight-coverage provider If 7AM-7PM, please contact day attending provider [email or web address]  02/12/2023, 11:04 PM

## 2023-02-12 NOTE — Hospital Course (Addendum)
Nathaniel Lane is a 77 year old male with history of neuropathy, non-insulin-dependent diabetes mellitus, hypothyroid, hypertension, GERD, BPH, hyperlipidemia, who presents to the emergency department for chief concerns of coughing up blood.  Vitals in the ED showed temperature 98.4, respiration rate of 18, heart rate of 91, blood pressure 128/71, SpO2 of 97% on 2 L nasal cannula.  Serum serum sodium is 133, potassium 3.7, chloride 99, bicarb 23, BUN of 31, serum creatinine 1.91, EGFR 36, nonfasting blood glucose 148, WBC 10.6, hemoglobin 12, platelets of 201.  CTA PE was read as right-sided pulmonary embolism.  New consolidations in posterior right sided upper and middle lobes, middle lobes worse than upper lobe.  Interval increased size of subcarinal and right hilar lymph nodes, could be reactive although metastatic disease can not be excluded. Recommend attention on follow-up.  New large subpleural nodule of the superior portion of the left lower lobe, concerning for metastatic disease. Recommend attention on follow-up.   ED treatment: Levofloxacin 750 mg IV one-time dose.  Heparin per pharmacy

## 2023-02-12 NOTE — Assessment & Plan Note (Signed)
In 2021

## 2023-02-12 NOTE — ED Notes (Signed)
Hx lung cancer and lobectomy, chronic mild SOB since then, but last few days has had significant SOB with mild exertion and was hypoxic in triage, on 2L now. EDP evaluated pt at bedside.

## 2023-02-13 ENCOUNTER — Inpatient Hospital Stay (HOSPITAL_COMMUNITY)
Admit: 2023-02-13 | Discharge: 2023-02-13 | Disposition: A | Discharge status: Home / Self Care | Payer: PPO | Attending: Internal Medicine | Admitting: Internal Medicine

## 2023-02-13 ENCOUNTER — Encounter: Payer: Self-pay | Admitting: Internal Medicine

## 2023-02-13 DIAGNOSIS — E1121 Type 2 diabetes mellitus with diabetic nephropathy: Secondary | ICD-10-CM

## 2023-02-13 DIAGNOSIS — R599 Enlarged lymph nodes, unspecified: Secondary | ICD-10-CM

## 2023-02-13 DIAGNOSIS — I2609 Other pulmonary embolism with acute cor pulmonale: Secondary | ICD-10-CM

## 2023-02-13 DIAGNOSIS — Z85118 Personal history of other malignant neoplasm of bronchus and lung: Secondary | ICD-10-CM | POA: Diagnosis not present

## 2023-02-13 DIAGNOSIS — Z85850 Personal history of malignant neoplasm of thyroid: Secondary | ICD-10-CM

## 2023-02-13 DIAGNOSIS — E890 Postprocedural hypothyroidism: Secondary | ICD-10-CM

## 2023-02-13 DIAGNOSIS — J9601 Acute respiratory failure with hypoxia: Secondary | ICD-10-CM

## 2023-02-13 DIAGNOSIS — I2699 Other pulmonary embolism without acute cor pulmonale: Secondary | ICD-10-CM | POA: Diagnosis not present

## 2023-02-13 LAB — ECHOCARDIOGRAM COMPLETE
AR max vel: 2.35 cm2
AV Area VTI: 2.8 cm2
AV Area mean vel: 2.44 cm2
AV Mean grad: 2 mmHg
AV Peak grad: 3.7 mmHg
Ao pk vel: 0.96 m/s
Area-P 1/2: 3.54 cm2
Height: 69 in
MV VTI: 1.7 cm2
S' Lateral: 2.9 cm
Weight: 2793.6 [oz_av]

## 2023-02-13 LAB — BASIC METABOLIC PANEL
Anion gap: 11 (ref 5–15)
BUN: 30 mg/dL — ABNORMAL HIGH (ref 8–23)
CO2: 24 mmol/L (ref 22–32)
Calcium: 8.7 mg/dL — ABNORMAL LOW (ref 8.9–10.3)
Chloride: 101 mmol/L (ref 98–111)
Creatinine, Ser: 1.59 mg/dL — ABNORMAL HIGH (ref 0.61–1.24)
GFR, Estimated: 45 mL/min — ABNORMAL LOW (ref >60)
Glucose, Bld: 120 mg/dL — ABNORMAL HIGH (ref 70–99)
Potassium: 4.1 mmol/L (ref 3.5–5.1)
Sodium: 136 mmol/L (ref 135–145)

## 2023-02-13 LAB — GLUCOSE, CAPILLARY
Glucose-Capillary: 139 mg/dL — ABNORMAL HIGH (ref 70–99)
Glucose-Capillary: 152 mg/dL — ABNORMAL HIGH (ref 70–99)
Glucose-Capillary: 86 mg/dL (ref 70–99)

## 2023-02-13 LAB — CBC
HCT: 37.7 % — ABNORMAL LOW (ref 39.0–52.0)
Hemoglobin: 12.1 g/dL — ABNORMAL LOW (ref 13.0–17.0)
MCH: 29.3 pg (ref 26.0–34.0)
MCHC: 32.1 g/dL (ref 30.0–36.0)
MCV: 91.3 fL (ref 80.0–100.0)
Platelets: 192 10*3/uL (ref 150–400)
RBC: 4.13 MIL/uL — ABNORMAL LOW (ref 4.22–5.81)
RDW: 12.6 % (ref 11.5–15.5)
WBC: 11.1 10*3/uL — ABNORMAL HIGH (ref 4.0–10.5)
nRBC: 0 % (ref 0.0–0.2)

## 2023-02-13 LAB — HEPARIN LEVEL (UNFRACTIONATED)
Heparin Unfractionated: 0.18 [IU]/mL — ABNORMAL LOW (ref 0.30–0.70)
Heparin Unfractionated: 0.18 [IU]/mL — ABNORMAL LOW (ref 0.30–0.70)

## 2023-02-13 LAB — CBG MONITORING, ED: Glucose-Capillary: 121 mg/dL — ABNORMAL HIGH (ref 70–99)

## 2023-02-13 MED ORDER — LEVOFLOXACIN IN D5W 750 MG/150ML IV SOLN: 750.0000 mg | INTRAVENOUS | Status: DC

## 2023-02-13 MED ORDER — TAMSULOSIN HCL 0.4 MG PO CAPS
0.4000 mg | ORAL_CAPSULE | Freq: Every day | ORAL | Status: DC
  Administered 2023-02-13 – 2023-02-14 (×2): 0.4 mg via ORAL
  Filled 2023-02-13 (×2): qty 1

## 2023-02-13 MED ORDER — HEPARIN BOLUS VIA INFUSION
2400.0000 [IU] | Freq: Once | INTRAVENOUS | Status: AC
  Administered 2023-02-13: 2400 [IU] via INTRAVENOUS
  Filled 2023-02-13: qty 2400

## 2023-02-13 MED ORDER — GABAPENTIN 300 MG PO CAPS
300.0000 mg | ORAL_CAPSULE | Freq: Three times a day (TID) | ORAL | Status: DC
  Administered 2023-02-13 – 2023-02-14 (×3): 300 mg via ORAL
  Filled 2023-02-13 (×3): qty 1

## 2023-02-13 MED ORDER — ADULT MULTIVITAMIN W/MINERALS CH
1.0000 | ORAL_TABLET | Freq: Every day | ORAL | Status: DC
  Administered 2023-02-13 – 2023-02-14 (×2): 1 via ORAL
  Filled 2023-02-13 (×2): qty 1

## 2023-02-13 MED ORDER — ORAL CARE MOUTH RINSE: 15.0000 mL | OROMUCOSAL | Status: DC | PRN

## 2023-02-13 MED ORDER — GLUCERNA SHAKE PO LIQD
237.0000 mL | Freq: Three times a day (TID) | ORAL | Status: DC
  Administered 2023-02-13 – 2023-02-14 (×2): 237 mL via ORAL

## 2023-02-13 MED ORDER — ATORVASTATIN CALCIUM 20 MG PO TABS
40.0000 mg | ORAL_TABLET | Freq: Every day | ORAL | Status: DC
  Administered 2023-02-13 – 2023-02-14 (×2): 40 mg via ORAL
  Filled 2023-02-13 (×2): qty 2

## 2023-02-13 NOTE — Consult Note (Signed)
Pharmacy Antibiotic Note  Nathaniel Lane is a 77 y.o. male admitted on 02/12/2023 with pneumonia and hemoptysis. PMH includes IDDM, hypothyroidism, HTN, GERD, BPH and HLD. Hemoptysis attributable to right PE but CXR also concerning for pneumonia. Pharmacy has been consulted for levofloxacin dosing. Patient's renal function is at baseline.  Height: 5\' 9"  (175.3 cm) Weight: 79.2 kg (174 lb 9.6 oz) IBW/kg (Calculated) : 70.7  Temp (24hrs), Avg:98.1 F (36.7 C), Min:98 F (36.7 C), Max:98.4 F (36.9 C)  Recent Labs  Lab 02/12/23 1739 02/13/23 0459  WBC 10.6* 11.1*  CREATININE 1.91* 1.59*    Estimated Creatinine Clearance: 40.1 mL/min (A) (by C-G formula based on SCr of 1.59 mg/dL (H)).    Allergies  Allergen Reactions   Penicillins Swelling    Swelling in mouth Swelling in mouth Swelling in mouth    Antimicrobials this admission: Levofloxacin 8/21 >>   Dose adjustments this admission: N/A  Microbiology results: 8/21 BCx: sent  PLAN: Initiate levofloxacin 750 mg IV q48H Follow up culture results to assess for antibiotic optimization. Monitor renal function to assess for any necessary antibiotic dosing changes.   Thank you for allowing pharmacy to be a part of this patient's care.  Will M. Dareen Piano, PharmD Clinical Pharmacist 02/13/2023 9:33 AM

## 2023-02-13 NOTE — Progress Notes (Signed)
Patient resting quietly in bed. Heparin gtt, 2L,Energy. No complaints of pain or SOB.

## 2023-02-13 NOTE — Consult Note (Signed)
ANTICOAGULATION CONSULT NOTE  Pharmacy Consult for IV Heparin Indication: pulmonary embolus  Patient Measurements: Height: 5\' 9"  (175.3 cm) Weight: 79.2 kg (174 lb 9.6 oz) IBW/kg (Calculated) : 70.7 Heparin Dosing Weight: 79.2  Labs: Recent Labs    02/12/23 1739 02/12/23 1931 02/13/23 0459 02/13/23 1428  HGB 12.0*  --  12.1*  --   HCT 36.8*  --  37.7*  --   PLT 201  --  192  --   APTT 40*  --   --   --   LABPROT 16.1*  --   --   --   INR 1.3*  --   --   --   HEPARINUNFRC  --   --  0.18* 0.18*  CREATININE 1.91*  --  1.59*  --   TROPONINIHS 86* 76*  --   --    Estimated Creatinine Clearance: 40.1 mL/min (A) (by C-G formula based on SCr of 1.59 mg/dL (H)).  Medical History: Past Medical History:  Diagnosis Date   Cancer (HCC) 2012   Stage IIA squamous cell Lung Ca   CHF (congestive heart failure) (HCC) 01/28/2017   Diabetes mellitus    Gout    Hyperlipidemia    Hypertension    Myocardial infarction [facility])    Thyroid cancer (HCC) 2013   s/p thyroidectomy 2014    Medications:  No anticoagulation prior to admission per my chart review. Medication reconciliation is pending  Assessment: 77 y/o M with medical history as above presenting with shortness of breath and hemoptysis. Pharmacy consulted to initiate heparin infusion for PE.   Baseline aPTT 40s (elevated), INR 1.3 (elevated). Baseline H&H acceptable.  Goal of Therapy:  Heparin level 0.3-0.7 units/ml Monitor platelets by anticoagulation protocol: Yes   08/21 0459 HL 0.18, subtherapeutic 08/21 1428 HL 0.18, subtherapeutic  Plan:  --Heparin 2400 unit IV bolus followed by continuous infusion at 1800 units/hr --Recheck HL in 8 hours after rate change --Daily CBC per protocol while on IV heparin  Bettey Costa, PharmD Clinical Pharmacist 02/13/2023 4:00 PM

## 2023-02-13 NOTE — Assessment & Plan Note (Signed)
-  Continue statin -Holding aspirin as patient is now on heparin

## 2023-02-13 NOTE — Consult Note (Signed)
ANTICOAGULATION CONSULT NOTE  Pharmacy Consult for IV Heparin Indication: pulmonary embolus  Patient Measurements: Height: 5\' 9"  (175.3 cm) Weight: 79.2 kg (174 lb 9.6 oz) IBW/kg (Calculated) : 70.7 Heparin Dosing Weight: 79.2  Labs: Recent Labs    02/12/23 1739 02/12/23 1931 02/13/23 0459  HGB 12.0*  --  12.1*  HCT 36.8*  --  37.7*  PLT 201  --  192  APTT 40*  --   --   LABPROT 16.1*  --   --   INR 1.3*  --   --   HEPARINUNFRC  --   --  0.18*  CREATININE 1.91*  --  1.59*  TROPONINIHS 86* 76*  --    Estimated Creatinine Clearance: 40.1 mL/min (A) (by C-G formula based on SCr of 1.59 mg/dL (H)).  Medical History: Past Medical History:  Diagnosis Date   Cancer (HCC) 2012   Stage IIA squamous cell Lung Ca   CHF (congestive heart failure) (HCC) 01/28/2017   Diabetes mellitus    Gout    Hyperlipidemia    Hypertension    Myocardial infarction [facility])    Thyroid cancer (HCC) 2013   s/p thyroidectomy 2014    Medications:  No anticoagulation prior to admission per my chart review. Medication reconciliation is pending  Assessment: 77 y/o M with medical history as above presenting with shortness of breath and hemoptysis. Pharmacy consulted to initiate heparin infusion for PE.   Baseline aPTT 40s (elevated), INR 1.3 (elevated). Baseline H&H acceptable.  Goal of Therapy:  Heparin level 0.3-0.7 units/ml Monitor platelets by anticoagulation protocol: Yes   08/21 0459 HL 0.18, subtherapeutic  Plan:  --Heparin 2400 unit IV bolus followed by continuous infusion at 1550 units/hr --Recheck HL in 8 hours after rate change --Daily CBC per protocol while on IV heparin  Otelia Sergeant, PharmD, [facility] 02/13/2023 5:44 AM

## 2023-02-13 NOTE — Assessment & Plan Note (Signed)
-   Continue home statin °

## 2023-02-13 NOTE — Consult Note (Signed)
[facility]  [phone] [phone]  ID: Claudie Fisherman OB: July 06, 1945  MR#: [id number]  AOZ#:[id number]  Patient Care Team: Sherlene Shams, MD as PCP - General (Internal Medicine)  CHIEF COMPLAINT: Pulmonary embolism, pulmonary nodule, history of lung cancer.  INTERVAL HISTORY: Patient is a 77 year old male who was last seen at the cancer center over for 5 years ago.  He presented to the emergency room after a long car trip with increasing shortness of breath and was found to have a pulmonary embolism.  He was also incidentally noted to have mild lymphadenopathy and a new pulmonary nodule.  He currently feels well and improved since admission.  He has no neurologic complaints.  He denies any recent fevers or illnesses.  He has a good appetite and denies weight loss.  He has no chest pain, cough, or hemoptysis.  He denies any nausea, vomiting, constipation, or diarrhea.  He has no urinary complaints.  Patient offers no further specific complaints today.  REVIEW OF SYSTEMS:   Review of Systems  Constitutional: Negative.  Negative for fever, malaise/fatigue and weight loss.  Respiratory:  Positive for shortness of breath. Negative for cough and hemoptysis.   Cardiovascular: Negative.  Negative for chest pain and leg swelling.  Gastrointestinal: Negative.  Negative for abdominal pain.  Genitourinary: Negative.  Negative for dysuria.  Musculoskeletal: Negative.  Negative for back pain.  Skin: Negative.  Negative for rash.  Neurological: Negative.  Negative for dizziness, focal weakness, weakness and headaches.  Psychiatric/Behavioral: Negative.  The patient is not nervous/anxious.     As per HPI. Otherwise, a complete review of systems is negative.  PAST MEDICAL HISTORY: Past Medical History:  Diagnosis Date   Cancer (HCC) 2012   Stage IIA squamous cell Lung Ca   CHF (congestive heart failure) (HCC) 01/28/2017   Diabetes mellitus    Gout     Hyperlipidemia    Hypertension    Myocardial infarction [facility])    Thyroid cancer (HCC) 2013   s/p thyroidectomy 2014    PAST SURGICAL HISTORY: Past Surgical History:  Procedure Laterality Date   COLONOSCOPY N/A 04/25/2016   Procedure: COLONOSCOPY;  Surgeon: Scot Jun, MD;  Location: [facility] ENDOSCOPY;  Service: Endoscopy;  Laterality: N/A;  Diabetic (oral meds)   COLONOSCOPY WITH PROPOFOL N/A 03/22/2021   Procedure: COLONOSCOPY WITH PROPOFOL;  Surgeon: Toney Reil, MD;  Location: [facility] ENDOSCOPY;  Service: Gastroenterology;  Laterality: N/A;   COLONOSCOPY WITH PROPOFOL N/A 06/29/2022   Procedure: COLONOSCOPY WITH PROPOFOL;  Surgeon: Jaynie Collins, DO;  Location: [facility] ENDOSCOPY;  Service: Gastroenterology;  Laterality: N/A;   CORONARY ARTERY BYPASS GRAFT  2007   LUNG LOBECTOMY     TOTAL THYROIDECTOMY N/A     FAMILY HISTORY: Family History  Problem Relation Age of Onset   Hypertension Other    Heart disease Father     ADVANCED DIRECTIVES (Y/N):  @ADVDIR @  HEALTH MAINTENANCE: Social History   Tobacco Use   Smoking status: Former    Current packs/day: 0.00    Types: Cigarettes    Quit date: 07/15/2005    Years since quitting: 17.5    Passive exposure: Past   Smokeless tobacco: Never  Vaping Use   Vaping status: Never Used  Substance Use Topics   Alcohol use: No   Drug use: No     Colonoscopy:  PAP:  Bone density:  Lipid panel:  Allergies  Allergen Reactions   Penicillins Swelling    Swelling in  mouth Swelling in mouth Swelling in mouth    Current Facility-Administered Medications  Medication Dose Route Frequency Provider Last Rate Last Admin   acetaminophen (TYLENOL) tablet 650 mg  650 mg Oral Q6H PRN Cox, Amy N, DO       Or   acetaminophen (TYLENOL) suppository 650 mg  650 mg Rectal Q6H PRN Cox, Amy N, DO       atorvastatin (LIPITOR) tablet 40 mg  40 mg Oral Daily Arnetha Courser, MD   40 mg at 02/13/23 1342   feeding supplement (GLUCERNA  SHAKE) (GLUCERNA SHAKE) liquid 237 mL  237 mL Oral TID BM Arnetha Courser, MD   237 mL at 02/13/23 1342   gabapentin (NEURONTIN) capsule 300 mg  300 mg Oral TID Arnetha Courser, MD       heparin ADULT infusion 100 units/mL (25000 units/227mL)  1,800 Units/hr Intravenous Continuous Mila Merry A, RPH 15.5 mL/hr at 02/13/23 1536 1,550 Units/hr at 02/13/23 1536   heparin bolus via infusion 2,400 Units  2,400 Units Intravenous Once Nazari, Walid A, RPH       insulin aspart (novoLOG) injection 0-5 Units  0-5 Units Subcutaneous QHS Cox, Amy N, DO       insulin aspart (novoLOG) injection 0-9 Units  0-9 Units Subcutaneous TID WC Cox, Amy N, DO   1 Units at 02/13/23 1343   [START ON 02/14/2023] levofloxacin (LEVAQUIN) IVPB 750 mg  750 mg Intravenous Q48H Orson Aloe, RPH       levothyroxine (SYNTHROID) tablet 137 mcg  137 mcg Oral QAC breakfast Cox, Amy N, DO   137 mcg at 02/13/23 4098   multivitamin with minerals tablet 1 tablet  1 tablet Oral Daily Arnetha Courser, MD   1 tablet at 02/13/23 1342   ondansetron (ZOFRAN) tablet 4 mg  4 mg Oral Q6H PRN Cox, Amy N, DO       Or   ondansetron (ZOFRAN) injection 4 mg  4 mg Intravenous Q6H PRN Cox, Amy N, DO       senna-docusate (Senokot-S) tablet 1 tablet  1 tablet Oral QHS PRN Cox, Amy N, DO       tamsulosin (FLOMAX) capsule 0.4 mg  0.4 mg Oral Daily Arnetha Courser, MD   0.4 mg at 02/13/23 1342    OBJECTIVE: Vitals:   02/13/23 0936 02/13/23 1305  BP:  122/71  Pulse:  66  Resp:  16  Temp: 98.2 F (36.8 C) 97.6 F (36.4 C)  SpO2:  100%     Body mass index is 25.78 kg/m.    ECOG FS:0 - Asymptomatic  General: Well-developed, well-nourished, no acute distress. Eyes: Pink conjunctiva, anicteric sclera. HEENT: Normocephalic, moist mucous membranes. Lungs: No audible wheezing or coughing. Heart: Regular rate and rhythm. Abdomen: Soft, nontender, no obvious distention. Musculoskeletal: No edema, cyanosis, or clubbing. Neuro: Alert, answering all  questions appropriately. Cranial nerves grossly intact. Skin: No rashes or petechiae noted. Psych: Normal affect. Lymphatics: No cervical, calvicular, axillary or inguinal LAD.   LAB RESULTS:  Lab Results  Component Value Date   NA 136 02/13/2023   K 4.1 02/13/2023   CL 101 02/13/2023   CO2 24 02/13/2023   GLUCOSE 120 (H) 02/13/2023   BUN 30 (H) 02/13/2023   CREATININE 1.59 (H) 02/13/2023   CALCIUM 8.7 (L) 02/13/2023   PROT 6.7 11/09/2022   ALBUMIN 3.8 11/09/2022   AST 16 11/09/2022   ALT 15 11/09/2022   ALKPHOS 83 11/09/2022   BILITOT 0.5 11/09/2022   GFRNONAA  45 (L) 02/13/2023   GFRAA 37 (L) 02/24/2020    Lab Results  Component Value Date   WBC 11.1 (H) 02/13/2023   NEUTROABS 5.4 05/25/2022   HGB 12.1 (L) 02/13/2023   HCT 37.7 (L) 02/13/2023   MCV 91.3 02/13/2023   PLT 192 02/13/2023     STUDIES: ECHOCARDIOGRAM COMPLETE  Result Date: 02/13/2023    ECHOCARDIOGRAM REPORT   Patient Name:   HANAD SHURN Date of Exam: 02/13/2023 Medical Rec #:  [id number]     Height:       69.0 in Accession #:    [id number]    Weight:       174.6 lb Date of Birth:  1946/05/16     BSA:          1.950 m Patient Age:    77 years      BP:           136/68 mmHg Patient Gender: M             HR:           73 bpm. Exam Location:  [facility] Procedure: 2D Echo, Cardiac Doppler and Color Doppler Indications:     Pulmonary Embolus I26.09  History:         Patient has no prior history of Echocardiogram examinations.                  CHF, Previous Myocardial Infarction; Risk Factors:Diabetes and                  Hypertension.  Sonographer:     Cristela Blue Referring Phys:  [id number] AMY N COX Diagnosing Phys: Julien Nordmann MD  Sonographer Comments: Suboptimal apical window and suboptimal parasternal window. Image acquisition challenging due to patient body habitus and best views are subcostal. IMPRESSIONS  1. Left ventricular ejection fraction, by estimation, is 60 to 65%. The left ventricle has normal function. The  left ventricle demonstrates regional wall motion abnormalities (basal to mid inferior wall hypokinesis). There is mild left ventricular hypertrophy. Left ventricular diastolic parameters are consistent with Grade I diastolic dysfunction (impaired relaxation).  2. Right ventricular systolic function is normal. The right ventricular size is normal. There is normal pulmonary artery systolic pressure. The estimated right ventricular systolic pressure is 19.7 mmHg.  3. The mitral valve is normal in structure. Mild mitral valve regurgitation. No evidence of mitral stenosis.  4. The aortic valve is normal in structure. Aortic valve regurgitation is not visualized. No aortic stenosis is present.  5. The inferior vena cava is normal in size with greater than 50% respiratory variability, suggesting right atrial pressure of 3 mmHg. FINDINGS  Left Ventricle: Left ventricular ejection fraction, by estimation, is 55 to 60%. The left ventricle has normal function. The left ventricle demonstrates regional wall motion abnormalities. The left ventricular internal cavity size was normal in size. There is mild left ventricular hypertrophy. Left ventricular diastolic parameters are consistent with Grade I diastolic dysfunction (impaired relaxation). Right Ventricle: The right ventricular size is normal. No increase in right ventricular wall thickness. Right ventricular systolic function is normal. There is normal pulmonary artery systolic pressure. The tricuspid regurgitant velocity is 1.92 m/s, and  with an assumed right atrial pressure of 5 mmHg, the estimated right ventricular systolic pressure is 19.7 mmHg. Left Atrium: Left atrial size was normal in size. Right Atrium: Right atrial size was normal in size. Pericardium: There is no evidence of pericardial effusion. Mitral Valve: The mitral valve  is normal in structure. There is mild thickening of the mitral valve leaflet(s). Mild mitral valve regurgitation. No evidence of mitral  valve stenosis. MV peak gradient, 4.2 mmHg. The mean mitral valve gradient is 2.0 mmHg. Tricuspid Valve: The tricuspid valve is normal in structure. Tricuspid valve regurgitation is not demonstrated. No evidence of tricuspid stenosis. Aortic Valve: The aortic valve is normal in structure. Aortic valve regurgitation is not visualized. No aortic stenosis is present. Aortic valve mean gradient measures 2.0 mmHg. Aortic valve peak gradient measures 3.7 mmHg. Aortic valve area, by VTI measures 2.80 cm. Pulmonic Valve: The pulmonic valve was normal in structure. Pulmonic valve regurgitation is not visualized. No evidence of pulmonic stenosis. Aorta: The aortic root is normal in size and structure. Venous: The inferior vena cava is normal in size with greater than 50% respiratory variability, suggesting right atrial pressure of 3 mmHg. IAS/Shunts: No atrial level shunt detected by color flow Doppler.  LEFT VENTRICLE PLAX 2D LVIDd:         4.70 cm   Diastology LVIDs:         2.90 cm   LV e' medial:    7.07 cm/s LV PW:         1.80 cm   LV E/e' medial:  16.0 LV IVS:        1.30 cm   LV e' lateral:   15.90 cm/s LVOT diam:     2.00 cm   LV E/e' lateral: 7.1 LV SV:         47 LV SV Index:   24 LVOT Area:     3.14 cm  RIGHT VENTRICLE RV S prime:     8.59 cm/s LEFT ATRIUM           Index        RIGHT ATRIUM           Index LA diam:      4.30 cm 2.20 cm/m   RA Area:     19.60 cm LA Vol (A2C): 45.0 ml 23.08 ml/m  RA Volume:   55.10 ml  28.25 ml/m LA Vol (A4C): 35.8 ml 18.36 ml/m  AORTIC VALVE AV Area (Vmax):    2.35 cm AV Area (Vmean):   2.44 cm AV Area (VTI):     2.80 cm AV Vmax:           95.95 cm/s AV Vmean:          63.850 cm/s AV VTI:            0.167 m AV Peak Grad:      3.7 mmHg AV Mean Grad:      2.0 mmHg LVOT Vmax:         71.90 cm/s LVOT Vmean:        49.500 cm/s LVOT VTI:          0.149 m LVOT/AV VTI ratio: 0.89 MITRAL VALVE                TRICUSPID VALVE MV Area (PHT): 3.54 cm     TR Peak grad:   14.7 mmHg MV  Area VTI:   1.70 cm     TR Vmax:        192.00 cm/s MV Peak grad:  4.2 mmHg MV Mean grad:  2.0 mmHg     SHUNTS MV Vmax:       1.02 m/s     Systemic VTI:  0.15 m MV Vmean:      71.8  cm/s    Systemic Diam: 2.00 cm MV Decel Time: 214 msec MV E velocity: 113.00 cm/s MV A velocity: 78.10 cm/s MV E/A ratio:  1.45 Julien Nordmann MD Electronically signed by Julien Nordmann MD Signature Date/Time: 02/13/2023/3:49:58 PM    Final    US Venous Img Lower Bilateral (DVT)  Result Date: 02/13/2023 CLINICAL DATA:  Pulmonary embolus EXAM: BILATERAL LOWER EXTREMITY VENOUS DOPPLER ULTRASOUND TECHNIQUE: Gray-scale sonography with compression, as well as color and duplex ultrasound, were performed to evaluate the deep venous system(s) from the level of the common femoral vein through the popliteal and proximal calf veins. COMPARISON:  None Available. FINDINGS: VENOUS Normal compressibility of the common femoral, superficial femoral, and popliteal veins, as well as the visualized calf veins. Visualized portions of profunda femoral vein and great saphenous vein unremarkable. No filling defects to suggest DVT on grayscale or color Doppler imaging. Doppler waveforms show normal direction of venous flow, normal respiratory plasticity and response to augmentation. OTHER None. Limitations: none IMPRESSION: Negative. Electronically Signed   By: Charlett Nose M.D.   On: 02/13/2023 00:01   CT Angio Chest PE W and/or Wo Contrast  Addendum Date: 02/12/2023   ADDENDUM REPORT: 02/12/2023 20:24 ADDENDUM: No evidence of right heart strain.  RV to LV ratio is 0.7. Electronically Signed   By: Allegra Lai M.D.   On: 02/12/2023 20:24   Result Date: 02/12/2023 CLINICAL DATA:  Shortness of breath and hemoptysis, history of lung cancer; * Tracking Code: BO * EXAM: CT ANGIOGRAPHY CHEST WITH CONTRAST TECHNIQUE: Multidetector CT imaging of the chest was performed using the standard protocol during bolus administration of intravenous contrast.  Multiplanar CT image reconstructions and MIPs were obtained to evaluate the vascular anatomy. RADIATION DOSE REDUCTION: This exam was performed according to the departmental dose-optimization program which includes automated exposure control, adjustment of the mA and/or kV according to patient size and/or use of iterative reconstruction technique. CONTRAST:  60mL OMNIPAQUE IOHEXOL 350 MG/ML SOLN COMPARISON:  Chest CT dated February 11, 2018 FINDINGS: Cardiovascular: Large filling defect of the right interlobar pulmonary artery. Mild cardiomegaly. Pericardial effusion. Normal caliber thoracic aorta with moderate atherosclerotic disease. Severe coronary artery calcifications. Mediastinum/Nodes: Esophagus is unremarkable. Prior thyroidectomy. Interval increased size of subcarinal and right hilar lymph nodes reference subcarinal lymph node measuring 10 mm in short axis on series 5 image 51, previously 7 mm. Lungs/Pleura: Moderate emphysema. Prior right lower lobectomy. New consolidations of the posterior right upper lobe and middle lobe, most severe in the right middle lobe. Stable small complex appearing pleural collection of the lower right lung. Left pleural thickening and calcifications unchanged when compared with the prior exam. New subpleural nodule of the superior portion of the left lower lobe measuring 1.8 x 1.5 cm on series 6, image 64. Stable pleural thickening of the posterior portion of the left upper lobe, likely scarring. Upper Abdomen: Cholelithiasis. Left adrenal gland nodule measuring 1.6 cm on series 5, image 304, consistent with benign adenoma. No acute abnormality. Musculoskeletal: No chest wall abnormality. No acute or significant osseous findings. Review of the MIP images confirms the above findings. IMPRESSION: 1. Pulmonary embolus of the right interlobar pulmonary artery. 2. Prior right lower lobectomy. New consolidations of the posterior right upper lobe and middle lobe, most severe in the right  middle lobe. Findings may be due to infection, although a component of local recurrence is also possible. Recommend PET-CT follow-up after resolution of acute symptoms. 3. Interval increased size of subcarinal and right hilar lymph  nodes, could be reactive although metastatic disease can not be excluded. Recommend attention on follow-up. 4. New large subpleural nodule of the superior portion of the left lower lobe, concerning for metastatic disease. Recommend attention on follow-up. 5. Coronary artery calcifications, aortic Atherosclerosis (ICD10-I70.0) and Emphysema (ICD10-J43.9). Critical Value/emergent results (pulmonary embolus) were called by telephone at the time of interpretation on 02/12/2023 at 7:39 pm to provider Willy Eddy , who verbally acknowledged these results. Electronically Signed: By: Allegra Lai M.D. On: 02/12/2023 19:45   DG Chest 2 View  Result Date: 02/12/2023 CLINICAL DATA:  Shortness of breath EXAM: CHEST - 2 VIEW COMPARISON:  Chest x-ray dated May 10, 2022 FINDINGS: Cardiac and mediastinal contours are unchanged. Status post median sternotomy. Right chest wall port. New right lower lung consolidation. Unchanged blunting of the bilateral costophrenic angles. No evidence of pneumothorax. IMPRESSION: New right lower lung consolidation, concerning for pneumonia. Recommend follow-up PA and lateral chest radiograph in 6-8 weeks to ensure resolution. Electronically Signed   By: Allegra Lai M.D.   On: 02/12/2023 19:09    ASSESSMENT:  Pulmonary embolism, pulmonary nodule, history of lung cancer.  PLAN:    Pulmonary embolism: Possibly related to recent long car trips.  Patient reports over the last several weeks he has been on 3 different car trips all negative than 12 to 14 hours.  Okay to transition patient to Eliquis.  He will require a minimum of 6 months of treatment. Left lower lobe pulmonary nodule, mild lymphadenopathy: Incidentally noted on CT scan.  Will repeat  CT scan in approximately 6 weeks after discharge to assess for any interval change.  Patient will have follow-up in the cancer center 4 to 5 days after imaging. 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 He completed adjuvant chemotherapy in July 2013.  Repeat imaging as above.  Will consider PET scan if necessary. Disposition: Okay to discharge from an oncology standpoint.  Follow-up imaging and evaluation as above.  Appreciate consult, will follow.  Jeralyn Ruths, MD   02/13/2023 4:02 PM

## 2023-02-13 NOTE — Progress Notes (Signed)
*  PRELIMINARY RESULTS* Echocardiogram 2D Echocardiogram has been performed.  Cristela Blue 02/13/2023, 2:47 PM

## 2023-02-13 NOTE — Progress Notes (Signed)
Progress Note   Patient: Nathaniel Lane ZOX:[id number] DOB: 1945-07-03 DOA: 02/12/2023     1 DOS: the patient was seen and examined on 02/13/2023   Brief hospital course: [name] is a 77 year old male with history of neuropathy, non-insulin-dependent diabetes mellitus, hypothyroid, hypertension, GERD, BPH, hyperlipidemia, who presents to the emergency department for chief concerns of coughing up blood.  Vitals in the ED showed temperature 98.4, respiration rate of 18, heart rate of 91, blood pressure 128/71, SpO2 of 97% on 2 L nasal cannula.  Serum serum sodium is 133, potassium 3.7, chloride 99, bicarb 23, BUN of 31, serum creatinine 1.91, EGFR 36, nonfasting blood glucose 148, WBC 10.6, hemoglobin 12, platelets of 201.  CTA PE was read as right-sided pulmonary embolism.  New consolidations in posterior right sided upper and middle lobes, middle lobes worse than upper lobe.  Interval increased size of subcarinal and right hilar lymph nodes, could be reactive although metastatic disease can not be excluded. Recommend attention on follow-up.  New large subpleural nodule of the superior portion of the left lower lobe, concerning for metastatic disease. Recommend attention on follow-up.   ED treatment: Levofloxacin 750 mg IV one-time dose.  Heparin per pharmacy  8/21: Vital stable.  Lower extremity venous Doppler negative for DVT.  Improving creatinine at 1.59, some worsening leukocytosis and stable hemoglobin. Starting on Levaquin.  Pending echocardiogram and oncology consult.  Assessment and Plan: * Right pulmonary embolus (HCC) No CT evidence of right heart strain.  Lower extremity venous Doppler negative for DVT. Heparin per pharmacy Oxygen supplementation to maintain SpO2 greater than 92%    Acute hypoxic respiratory failure (HCC) Secondary to new right sided PE, treat per above  History of lung cancer Status post right-sided thoracotomy and lower lobectomy in 09/2011 History  of mediastinal lymphadenectomy Status post chemotherapy Concern of recurrence-pending oncology evaluation  History of thyroid cancer Status post total thyroidectomy with central lymph node dissection in March 2014 Status post R-131 in July 2014  Lymph nodes enlarged Per CTA chest read: Interval increased size of subcarinal and right hilar lymph nodes, could be reactive although metastatic disease can not be excluded. New large subpleural nodule of the superior portion of the left lower lobe, concerning for metastatic disease.  Patient formally saw Dr. Orlie Lane and is requesting Dr. Orlie Lane to be consulted Staff message sent to Dr. Orlie Lane along with Epic order placement  Postsurgical hypothyroidism Levothyroxine 137 mcg daily before breakfast resumed  Type 2 diabetes mellitus, controlled, with renal complications (HCC) Insulin SSI with at bedtime coverage ordered  Anxiety state Lorazepam 0.5 mg p.o. every 6 hours as needed for anxiety, 12 hours of coverage ordered  CAD (coronary artery disease) -Continue statin -Holding aspirin as patient is now on heparin  Hyperlipidemia associated with type 2 diabetes mellitus (HCC) -Continue home statin  Personal history of COVID-19 In 2021      Subjective: Patient was seen and examined today.  Had a very scant amount of blood with cough overnight.  No chest pain.  No baseline oxygen use  Physical Exam: Vitals:   02/13/23 0330 02/13/23 0700 02/13/23 0936 02/13/23 1305  BP: 121/67 136/68  122/71  Pulse: 69 73  66  Resp: 18 (!) 22  16  Temp:   98.2 F (36.8 C) 97.6 F (36.4 C)  TempSrc:    Oral  SpO2: 96% 97%  100%  Weight:      Height:       General.  Well-developed elderly  man, in no acute distress. Pulmonary.  Her harsh breath sounds bilaterally, normal respiratory effort. CV.  Regular rate and rhythm, no JVD, rub or murmur. Abdomen.  Soft, nontender, nondistended, BS positive. CNS.  Alert and oriented .  No focal  neurologic deficit. Extremities.  No edema, no cyanosis, pulses intact and symmetrical. Psychiatry.  Judgment and insight appears normal.   Data Reviewed: Prior data reviewed  Family Communication: Talked with son on phone.  Disposition: Status is: Inpatient Remains inpatient appropriate because: Severity of illness  Planned Discharge Destination: Home  DVT prophylaxis.  Heparin Time spent: 50 minutes  This record has been created using Conservation officer, historic buildings. Errors have been sought and corrected,but may not always be located. Such creation errors do not reflect on the standard of care.   Author: Arnetha Courser, MD 02/13/2023 1:31 PM  For on call review [email or web address].

## 2023-02-13 NOTE — Progress Notes (Signed)
Initial Nutrition Assessment  DOCUMENTATION CODES:   Not applicable  INTERVENTION:   -Liberalize diet to carb modified for wider variety of meal selections -MVI with minerals daily -Glucerna Shake po TID, each supplement provides 220 kcal and 10 grams of protein   NUTRITION DIAGNOSIS:   Increased nutrient needs related to acute illness as evidenced by estimated needs.  GOAL:   Patient will meet greater than or equal to 90% of their needs  MONITOR:   PO intake, Supplement acceptance  REASON FOR ASSESSMENT:   Malnutrition Screening Tool    ASSESSMENT:   Pt with history of neuropathy, non-insulin-dependent diabetes mellitus, hypothyroid, hypertension, GERD, BPH, hyperlipidemia, who presents for chief concerns of coughing up blood.  Pt admitted with rt pulmonary embolus.    Per H&P, pt with increased size of subcarnial and rt hilar lymph nodes as well as new large subpleural nodule of the superior portion of the lt lower lobe, concerning for metastatic diease. Oncology consult has been requested.   Pt unavailable at time of visit. RD unable to obtain further nutrition-related history or complete nutrition-focused physical exam at this time.    Pt currently on a heart healthy diet. No meal completion data available to assess a this time.   Reviewed wt hx; no wt loss noted over the past 6 months.   Medications reviewed.   Lab Results  Component Value Date   HGBA1C 6.3 11/09/2022   PTA DM medications are 10 mg farxiga daily, 5 mg glipizide daily, and 750 mg metformin daily.   Labs reviewed: CBGS: 1110-121 (inpatient orders for glycemic control are 0-5 units insulin aspart daily at bedtime and 0-9 units insulin aspart TID with meals).    Diet Order:   Diet Order             Diet Heart Room service appropriate? Yes; Fluid consistency: Thin  Diet effective now                   EDUCATION NEEDS:   No education needs have been identified at this time  Skin:   Skin Assessment: Reviewed RN Assessment  Last BM:  Unknown  Height:   Ht Readings from Last 1 Encounters:  02/12/23 5\' 9"  (1.753 m)    Weight:   Wt Readings from Last 1 Encounters:  02/12/23 79.2 kg    Ideal Body Weight:  72.7 kg  BMI:  Body mass index is 25.78 kg/m.  Estimated Nutritional Needs:   Kcal:  2000-2200  Protein:  100-115 grams  Fluid:  > 2 L    Levada Schilling, RD, LDN, CDCES Registered Dietitian II Certified Diabetes Care and Education Specialist Please refer to [facility] for RD and/or RD on-call/weekend/after hours pager

## 2023-02-14 ENCOUNTER — Encounter: Payer: Self-pay | Admitting: *Deleted

## 2023-02-14 ENCOUNTER — Other Ambulatory Visit (HOSPITAL_COMMUNITY): Payer: Self-pay

## 2023-02-14 ENCOUNTER — Ambulatory Visit: Payer: PPO | Admitting: Nurse Practitioner

## 2023-02-14 DIAGNOSIS — J9601 Acute respiratory failure with hypoxia: Secondary | ICD-10-CM | POA: Diagnosis not present

## 2023-02-14 DIAGNOSIS — I2699 Other pulmonary embolism without acute cor pulmonale: Secondary | ICD-10-CM | POA: Diagnosis not present

## 2023-02-14 DIAGNOSIS — Z85118 Personal history of other malignant neoplasm of bronchus and lung: Secondary | ICD-10-CM

## 2023-02-14 DIAGNOSIS — R599 Enlarged lymph nodes, unspecified: Secondary | ICD-10-CM | POA: Diagnosis not present

## 2023-02-14 DIAGNOSIS — R911 Solitary pulmonary nodule: Secondary | ICD-10-CM

## 2023-02-14 LAB — CREATININE, SERUM
Creatinine, Ser: 1.38 mg/dL — ABNORMAL HIGH (ref 0.61–1.24)
GFR, Estimated: 53 mL/min — ABNORMAL LOW (ref >60)

## 2023-02-14 LAB — GLUCOSE, CAPILLARY
Glucose-Capillary: 122 mg/dL — ABNORMAL HIGH (ref 70–99)
Glucose-Capillary: 164 mg/dL — ABNORMAL HIGH (ref 70–99)

## 2023-02-14 LAB — HEPARIN LEVEL (UNFRACTIONATED)
Heparin Unfractionated: 0.71 [IU]/mL — ABNORMAL HIGH (ref 0.30–0.70)
Heparin Unfractionated: 0.8 [IU]/mL — ABNORMAL HIGH (ref 0.30–0.70)

## 2023-02-14 MED ORDER — APIXABAN 5 MG PO TABS
10.0000 mg | ORAL_TABLET | Freq: Two times a day (BID) | ORAL | Status: DC
  Administered 2023-02-14: 10 mg via ORAL
  Filled 2023-02-14: qty 2

## 2023-02-14 MED ORDER — APIXABAN 5 MG PO TABS: 5.0000 mg | ORAL_TABLET | Freq: Two times a day (BID) | ORAL | 1 refills | Status: DC

## 2023-02-14 MED ORDER — APIXABAN (ELIQUIS) VTE STARTER PACK (10MG AND 5MG): ORAL_TABLET | ORAL | 0 refills | Status: DC

## 2023-02-14 MED ORDER — LEVOFLOXACIN 750 MG PO TABS: 750.0000 mg | ORAL_TABLET | ORAL | 0 refills | Status: AC

## 2023-02-14 MED ORDER — APIXABAN 5 MG PO TABS: 5.0000 mg | ORAL_TABLET | Freq: Two times a day (BID) | ORAL | Status: DC

## 2023-02-14 MED ORDER — LEVOTHYROXINE SODIUM 137 MCG PO TABS: 137.0000 ug | ORAL_TABLET | Freq: Every day | ORAL | 1 refills | Status: DC

## 2023-02-14 NOTE — Discharge Summary (Addendum)
Physician Discharge Summary   Patient: Nathaniel Lane MRN: [id number] DOB: April 06, 1946  Admit date:     02/12/2023  Discharge date: 02/14/23  Discharge Physician: Arnetha Courser   PCP: Sherlene Shams, MD   Recommendations at discharge:  Please obtain CBC and BMP in 1 week Please ensure the completion of antibiotics Patient is being started on Eliquis for PE-please ensure the compliance, likely will need at least 6 months of anticoagulation. Follow-up with oncology Follow-up with cardiology Follow-up with primary care provider  Discharge Diagnoses: Principal Problem:   Acute pulmonary embolism (HCC) Active Problems:   Acute hypoxic respiratory failure (HCC)   History of lung cancer   History of thyroid cancer   Lymph nodes enlarged   Postsurgical hypothyroidism   Type 2 diabetes mellitus, controlled, with renal complications (HCC)   Anxiety state   CAD (coronary artery disease)   Hyperlipidemia associated with type 2 diabetes mellitus (HCC)   Personal history of COVID-19   Hospital Course: [name] is a 77 year old male with history of neuropathy, non-insulin-dependent diabetes mellitus, hypothyroid, hypertension, GERD, BPH, hyperlipidemia, who presents to the emergency department for chief concerns of coughing up blood.  Vitals in the ED showed temperature 98.4, respiration rate of 18, heart rate of 91, blood pressure 128/71, SpO2 of 97% on 2 L nasal cannula.  Serum serum sodium is 133, potassium 3.7, chloride 99, bicarb 23, BUN of 31, serum creatinine 1.91, EGFR 36, nonfasting blood glucose 148, WBC 10.6, hemoglobin 12, platelets of 201.  CTA PE was read as right-sided pulmonary embolism.  New consolidations in posterior right sided upper and middle lobes, middle lobes worse than upper lobe.  Interval increased size of subcarinal and right hilar lymph nodes, could be reactive although metastatic disease can not be excluded. Recommend attention on follow-up.  New large  subpleural nodule of the superior portion of the left lower lobe, concerning for metastatic disease. Recommend attention on follow-up.   ED treatment: Levofloxacin 750 mg IV one-time dose.  Heparin per pharmacy  8/21: Vital stable.  Lower extremity venous Doppler negative for DVT.  Improving creatinine at 1.59, some worsening leukocytosis and stable hemoglobin. Starting on Levaquin.    8/22: Patient remained stable.  On room air.  He was able to ambulate without any hypoxia.  Echocardiogram with normal EF and some regional wall motion abnormalities in the basal to mid inferior wall, discussed with cardiology and they were recommending outpatient follow-up.  No right heart strain.  No chest pain.  Patient was feeling much improved and wants to go home.  Patient is being discharged on 3 more doses of Levaquin which she will take every 8 hourly for concern of pneumonia.  His oncologist Dr. Orlie Dakin evaluated him and recommending outpatient follow-up and they will repeat CT chest in few weeks.  He is being started on Eliquis and will need at least 6 months of anticoagulation per oncology.  Patient recently had long car trips which can be contributory to PE.  His lower extremity venous Doppler was negative.  His home aspirin was discontinued to decrease the risk of bleeding.  Patient will continue with rest of his home medications and need to have a close follow-up with his providers for further recommendations.    Assessment and Plan: * Acute pulmonary embolism (HCC) No CT evidence of right heart strain.  Lower extremity venous Doppler negative for DVT.  Echo with normal EF, grade 1 diastolic dysfunction Switched heparin with Eliquis   Acute hypoxic  respiratory failure (HCC) Resolved.  Patient is on room air now  History of lung cancer Status post right-sided thoracotomy and lower lobectomy in 09/2011 History of mediastinal lymphadenectomy Status post chemotherapy Concern of  recurrence-oncology will follow-up as outpatient and repeat imaging in few weeks  History of thyroid cancer Status post total thyroidectomy with central lymph node dissection in March 2014 Status post R-131 in July 2014  Lymph nodes enlarged Per CTA chest read: Interval increased size of subcarinal and right hilar lymph nodes, could be reactive although metastatic disease can not be excluded. New large subpleural nodule of the superior portion of the left lower lobe, concerning for metastatic disease.  Patient formally saw Dr. Orlie Dakin and is requesting Dr. Orlie Dakin to be consulted, saw the patient and they will follow-up as outpatient   Postsurgical hypothyroidism Levothyroxine 137 mcg daily before breakfast resumed  Type 2 diabetes mellitus, controlled, with renal complications (HCC) Insulin SSI with at bedtime coverage ordered  Anxiety state Lorazepam 0.5 mg p.o. every 6 hours as needed for anxiety, 12 hours of coverage ordered  CAD (coronary artery disease) -Continue statin -Holding aspirin as patient is now on heparin  Hyperlipidemia associated with type 2 diabetes mellitus (HCC) -Continue home statin  Personal history of COVID-19 In 2021       Consultants: Oncology Procedures performed: None Disposition: Home Diet recommendation:  Discharge Diet Orders (From admission, onward)     Start     Ordered   02/14/23 0000  Diet - low sodium heart healthy        02/14/23 1208           Cardiac and Carb modified diet DISCHARGE MEDICATION: Allergies as of 02/14/2023       Reactions   Penicillins Swelling   Swelling in mouth Swelling in mouth Swelling in mouth        Medication List     STOP taking these medications    aspirin EC 81 MG tablet Commonly known as: Aspirin Low Dose   tiZANidine 4 MG tablet Commonly known as: ZANAFLEX       TAKE these medications    Apixaban Starter Pack (10mg  and 5mg ) Commonly known as: ELIQUIS STARTER PACK Take as  directed on package: start with two-5mg  tablets twice daily for 7 days. On day 8, switch to one-5mg  tablet twice daily.   apixaban 5 MG Tabs tablet Commonly known as: ELIQUIS Take 1 tablet (5 mg total) by mouth 2 (two) times daily. Started after completing starter pack   atorvastatin 40 MG tablet Commonly known as: LIPITOR TAKE 1 TABLET BY MOUTH EVERY DAY   blood glucose meter kit and supplies Kit Dispense based on patient and insurance preference. Use up to four times daily as directed. (FOR ICD-10 E11.29).   gabapentin 300 MG capsule Commonly known as: NEURONTIN TAKE 1 CAPSULE BY MOUTH FOUR TIMES A DAY   glipiZIDE 5 MG 24 hr tablet Commonly known as: GLUCOTROL XL TAKE 1 TABLET BY MOUTH EVERY DAY WITH BREAKFAST   levofloxacin 750 MG tablet Commonly known as: Levaquin Take 1 tablet (750 mg total) by mouth every other day for 3 doses.   levothyroxine 137 MCG tablet Commonly known as: SYNTHROID Take 1 tablet (137 mcg total) by mouth daily before breakfast. What changed: Another medication with the same name was removed. Continue taking this medication, and follow the directions you see here.   losartan 50 MG tablet Commonly known as: COZAAR TAKE 1 TABLET BY MOUTH EVERY DAY  metFORMIN 750 MG 24 hr tablet Commonly known as: GLUCOPHAGE-XR TAKE 1 TABLET BY MOUTH EVERY DAY WITH BREAKFAST   OneTouch Delica Lancets Fine Misc USE AS DIRECTED 4 TIMES A DAY   OneTouch Ultra test strip Generic drug: glucose blood USE AS DIRECTED 4 TIMES A DAY   tamsulosin 0.4 MG Caps capsule Commonly known as: FLOMAX Take 1 capsule (0.4 mg total) by mouth daily.        Follow-up Information     Sherlene Shams, MD. Schedule an appointment as soon as possible for a visit in 1 week(s).   Specialty: Internal Medicine Contact information: [address] [place] [address] [phone]         Alwyn Pea, MD. Schedule an appointment as soon as possible for a  visit.   Specialties: Cardiology, Internal Medicine Contact information: [address] [place] [address] [phone]         Jeralyn Ruths, MD. Schedule an appointment as soon as possible for a visit.   Specialty: Oncology Contact information: [address] [place] [address] [phone]                Discharge Exam: Ceasar Mons Weights   02/12/23 1958 02/14/23 0421  Weight: 79.2 kg 73.4 kg   General.  Well-developed elderly man, in no acute distress. Pulmonary.  Lungs clear bilaterally, normal respiratory effort. CV.  Regular rate and rhythm, no JVD, rub or murmur. Abdomen.  Soft, nontender, nondistended, BS positive. CNS.  Alert and oriented .  No focal neurologic deficit. Extremities.  No edema, no cyanosis, pulses intact and symmetrical. Psychiatry.  Judgment and insight appears normal.   Condition at discharge: stable  The results of significant diagnostics from this hospitalization (including imaging, microbiology, ancillary and laboratory) are listed below for reference.   Imaging Studies: ECHOCARDIOGRAM COMPLETE  Result Date: 02/13/2023    ECHOCARDIOGRAM REPORT   Patient Name:   RIGO LARKE Date of Exam: 02/13/2023 Medical Rec #:  [id number]     Height:       69.0 in Accession #:    [id number]    Weight:       174.6 lb Date of Birth:  Nov 02, 1945     BSA:          1.950 m Patient Age:    77 years      BP:           136/68 mmHg Patient Gender: M             HR:           73 bpm. Exam Location:  [facility] Procedure: 2D Echo, Cardiac Doppler and Color Doppler Indications:     Pulmonary Embolus I26.09  History:         Patient has no prior history of Echocardiogram examinations.                  CHF, Previous Myocardial Infarction; Risk Factors:Diabetes and                  Hypertension.  Sonographer:     Cristela Blue Referring Phys:  [id number] AMY N COX Diagnosing Phys: Julien Nordmann MD  Sonographer Comments: Suboptimal apical window and suboptimal  parasternal window. Image acquisition challenging due to patient body habitus and best views are subcostal. IMPRESSIONS  1. Left ventricular ejection fraction, by estimation, is 60 to 65%. The left ventricle has normal function. The left ventricle demonstrates regional wall  motion abnormalities (basal to mid inferior wall hypokinesis). There is mild left ventricular hypertrophy. Left ventricular diastolic parameters are consistent with Grade I diastolic dysfunction (impaired relaxation).  2. Right ventricular systolic function is normal. The right ventricular size is normal. There is normal pulmonary artery systolic pressure. The estimated right ventricular systolic pressure is 19.7 mmHg.  3. The mitral valve is normal in structure. Mild mitral valve regurgitation. No evidence of mitral stenosis.  4. The aortic valve is normal in structure. Aortic valve regurgitation is not visualized. No aortic stenosis is present.  5. The inferior vena cava is normal in size with greater than 50% respiratory variability, suggesting right atrial pressure of 3 mmHg. FINDINGS  Left Ventricle: Left ventricular ejection fraction, by estimation, is 55 to 60%. The left ventricle has normal function. The left ventricle demonstrates regional wall motion abnormalities. The left ventricular internal cavity size was normal in size. There is mild left ventricular hypertrophy. Left ventricular diastolic parameters are consistent with Grade I diastolic dysfunction (impaired relaxation). Right Ventricle: The right ventricular size is normal. No increase in right ventricular wall thickness. Right ventricular systolic function is normal. There is normal pulmonary artery systolic pressure. The tricuspid regurgitant velocity is 1.92 m/s, and  with an assumed right atrial pressure of 5 mmHg, the estimated right ventricular systolic pressure is 19.7 mmHg. Left Atrium: Left atrial size was normal in size. Right Atrium: Right atrial size was normal in  size. Pericardium: There is no evidence of pericardial effusion. Mitral Valve: The mitral valve is normal in structure. There is mild thickening of the mitral valve leaflet(s). Mild mitral valve regurgitation. No evidence of mitral valve stenosis. MV peak gradient, 4.2 mmHg. The mean mitral valve gradient is 2.0 mmHg. Tricuspid Valve: The tricuspid valve is normal in structure. Tricuspid valve regurgitation is not demonstrated. No evidence of tricuspid stenosis. Aortic Valve: The aortic valve is normal in structure. Aortic valve regurgitation is not visualized. No aortic stenosis is present. Aortic valve mean gradient measures 2.0 mmHg. Aortic valve peak gradient measures 3.7 mmHg. Aortic valve area, by VTI measures 2.80 cm. Pulmonic Valve: The pulmonic valve was normal in structure. Pulmonic valve regurgitation is not visualized. No evidence of pulmonic stenosis. Aorta: The aortic root is normal in size and structure. Venous: The inferior vena cava is normal in size with greater than 50% respiratory variability, suggesting right atrial pressure of 3 mmHg. IAS/Shunts: No atrial level shunt detected by color flow Doppler.  LEFT VENTRICLE PLAX 2D LVIDd:         4.70 cm   Diastology LVIDs:         2.90 cm   LV e' medial:    7.07 cm/s LV PW:         1.80 cm   LV E/e' medial:  16.0 LV IVS:        1.30 cm   LV e' lateral:   15.90 cm/s LVOT diam:     2.00 cm   LV E/e' lateral: 7.1 LV SV:         47 LV SV Index:   24 LVOT Area:     3.14 cm  RIGHT VENTRICLE RV S prime:     8.59 cm/s LEFT ATRIUM           Index        RIGHT ATRIUM           Index LA diam:      4.30 cm 2.20 cm/m   RA Area:  19.60 cm LA Vol (A2C): 45.0 ml 23.08 ml/m  RA Volume:   55.10 ml  28.25 ml/m LA Vol (A4C): 35.8 ml 18.36 ml/m  AORTIC VALVE AV Area (Vmax):    2.35 cm AV Area (Vmean):   2.44 cm AV Area (VTI):     2.80 cm AV Vmax:           95.95 cm/s AV Vmean:          63.850 cm/s AV VTI:            0.167 m AV Peak Grad:      3.7 mmHg AV Mean  Grad:      2.0 mmHg LVOT Vmax:         71.90 cm/s LVOT Vmean:        49.500 cm/s LVOT VTI:          0.149 m LVOT/AV VTI ratio: 0.89 MITRAL VALVE                TRICUSPID VALVE MV Area (PHT): 3.54 cm     TR Peak grad:   14.7 mmHg MV Area VTI:   1.70 cm     TR Vmax:        192.00 cm/s MV Peak grad:  4.2 mmHg MV Mean grad:  2.0 mmHg     SHUNTS MV Vmax:       1.02 m/s     Systemic VTI:  0.15 m MV Vmean:      71.8 cm/s    Systemic Diam: 2.00 cm MV Decel Time: 214 msec MV E velocity: 113.00 cm/s MV A velocity: 78.10 cm/s MV E/A ratio:  1.45 Julien Nordmann MD Electronically signed by Julien Nordmann MD Signature Date/Time: 02/13/2023/3:49:58 PM    Final    US Venous Img Lower Bilateral (DVT)  Result Date: 02/13/2023 CLINICAL DATA:  Pulmonary embolus EXAM: BILATERAL LOWER EXTREMITY VENOUS DOPPLER ULTRASOUND TECHNIQUE: Gray-scale sonography with compression, as well as color and duplex ultrasound, were performed to evaluate the deep venous system(s) from the level of the common femoral vein through the popliteal and proximal calf veins. COMPARISON:  None Available. FINDINGS: VENOUS Normal compressibility of the common femoral, superficial femoral, and popliteal veins, as well as the visualized calf veins. Visualized portions of profunda femoral vein and great saphenous vein unremarkable. No filling defects to suggest DVT on grayscale or color Doppler imaging. Doppler waveforms show normal direction of venous flow, normal respiratory plasticity and response to augmentation. OTHER None. Limitations: none IMPRESSION: Negative. Electronically Signed   By: Charlett Nose M.D.   On: 02/13/2023 00:01   CT Angio Chest PE W and/or Wo Contrast  Addendum Date: 02/12/2023   ADDENDUM REPORT: 02/12/2023 20:24 ADDENDUM: No evidence of right heart strain.  RV to LV ratio is 0.7. Electronically Signed   By: Allegra Lai M.D.   On: 02/12/2023 20:24   Result Date: 02/12/2023 CLINICAL DATA:  Shortness of breath and hemoptysis,  history of lung cancer; * Tracking Code: BO * EXAM: CT ANGIOGRAPHY CHEST WITH CONTRAST TECHNIQUE: Multidetector CT imaging of the chest was performed using the standard protocol during bolus administration of intravenous contrast. Multiplanar CT image reconstructions and MIPs were obtained to evaluate the vascular anatomy. RADIATION DOSE REDUCTION: This exam was performed according to the departmental dose-optimization program which includes automated exposure control, adjustment of the mA and/or kV according to patient size and/or use of iterative reconstruction technique. CONTRAST:  60mL OMNIPAQUE IOHEXOL 350 MG/ML SOLN COMPARISON:  Chest CT dated  February 11, 2018 FINDINGS: Cardiovascular: Large filling defect of the right interlobar pulmonary artery. Mild cardiomegaly. Pericardial effusion. Normal caliber thoracic aorta with moderate atherosclerotic disease. Severe coronary artery calcifications. Mediastinum/Nodes: Esophagus is unremarkable. Prior thyroidectomy. Interval increased size of subcarinal and right hilar lymph nodes reference subcarinal lymph node measuring 10 mm in short axis on series 5 image 51, previously 7 mm. Lungs/Pleura: Moderate emphysema. Prior right lower lobectomy. New consolidations of the posterior right upper lobe and middle lobe, most severe in the right middle lobe. Stable small complex appearing pleural collection of the lower right lung. Left pleural thickening and calcifications unchanged when compared with the prior exam. New subpleural nodule of the superior portion of the left lower lobe measuring 1.8 x 1.5 cm on series 6, image 64. Stable pleural thickening of the posterior portion of the left upper lobe, likely scarring. Upper Abdomen: Cholelithiasis. Left adrenal gland nodule measuring 1.6 cm on series 5, image 304, consistent with benign adenoma. No acute abnormality. Musculoskeletal: No chest wall abnormality. No acute or significant osseous findings. Review of the MIP images  confirms the above findings. IMPRESSION: 1. Pulmonary embolus of the right interlobar pulmonary artery. 2. Prior right lower lobectomy. New consolidations of the posterior right upper lobe and middle lobe, most severe in the right middle lobe. Findings may be due to infection, although a component of local recurrence is also possible. Recommend PET-CT follow-up after resolution of acute symptoms. 3. Interval increased size of subcarinal and right hilar lymph nodes, could be reactive although metastatic disease can not be excluded. Recommend attention on follow-up. 4. New large subpleural nodule of the superior portion of the left lower lobe, concerning for metastatic disease. Recommend attention on follow-up. 5. Coronary artery calcifications, aortic Atherosclerosis (ICD10-I70.0) and Emphysema (ICD10-J43.9). Critical Value/emergent results (pulmonary embolus) were called by telephone at the time of interpretation on 02/12/2023 at 7:39 pm to provider Willy Eddy , who verbally acknowledged these results. Electronically Signed: By: Allegra Lai M.D. On: 02/12/2023 19:45   DG Chest 2 View  Result Date: 02/12/2023 CLINICAL DATA:  Shortness of breath EXAM: CHEST - 2 VIEW COMPARISON:  Chest x-ray dated May 10, 2022 FINDINGS: Cardiac and mediastinal contours are unchanged. Status post median sternotomy. Right chest wall port. New right lower lung consolidation. Unchanged blunting of the bilateral costophrenic angles. No evidence of pneumothorax. IMPRESSION: New right lower lung consolidation, concerning for pneumonia. Recommend follow-up PA and lateral chest radiograph in 6-8 weeks to ensure resolution. Electronically Signed   By: Allegra Lai M.D.   On: 02/12/2023 19:09    Microbiology: Results for orders placed or performed during the hospital encounter of 02/12/23  Blood culture (routine x 2)     Status: None (Preliminary result)   Collection Time: 02/12/23  7:57 PM   Specimen: BLOOD  Result  Value Ref Range Status   Specimen Description BLOOD BLOOD LEFT ARM  Final   Special Requests   Final    BOTTLES DRAWN AEROBIC AND ANAEROBIC Blood Culture results may not be optimal due to an excessive volume of blood received in culture bottles   Culture   Final    NO GROWTH 2 DAYS Performed at [facility], [address], [place], [place] [address]    Report Status PENDING  Incomplete  Blood culture (routine x 2)     Status: None (Preliminary result)   Collection Time: 02/12/23  7:57 PM   Specimen: BLOOD  Result Value Ref Range Status   Specimen Description  BLOOD BLOOD LEFT ARM  Final   Special Requests   Final    BOTTLES DRAWN AEROBIC AND ANAEROBIC Blood Culture results may not be optimal due to an excessive volume of blood received in culture bottles   Culture   Final    NO GROWTH 2 DAYS Performed at [facility], [address] [place], [place] [address]    Report Status PENDING  Incomplete    Labs: CBC: Recent Labs  Lab 02/12/23 1739 02/13/23 0459  WBC 10.6* 11.1*  HGB 12.0* 12.1*  HCT 36.8* 37.7*  MCV 91.1 91.3  PLT 201 192   Basic Metabolic Panel: Recent Labs  Lab 02/12/23 1739 02/13/23 0459 02/14/23 0413  NA 133* 136  --   K 3.7 4.1  --   CL 99 101  --   CO2 23 24  --   GLUCOSE 148* 120*  --   BUN 31* 30*  --   CREATININE 1.91* 1.59* 1.38*  CALCIUM 8.4* 8.7*  --    Liver Function Tests: No results for input(s): "AST", "ALT", "ALKPHOS", "BILITOT", "PROT", "ALBUMIN" in the last 168 hours. CBG: Recent Labs  Lab 02/13/23 1340 02/13/23 1819 02/13/23 2035 02/14/23 0748 02/14/23 1117  GLUCAP 139* 152* 86 122* 164*    Discharge time spent: greater than 30 minutes.  This record has been created using Conservation officer, historic buildings. Errors have been sought and corrected,but may not always be located. Such creation errors do not reflect on the standard of care.   Signed: Arnetha Courser, MD [facility] 02/14/2023

## 2023-02-14 NOTE — Consult Note (Addendum)
ANTICOAGULATION CONSULT NOTE  Pharmacy Consult for IV Heparin Indication: pulmonary embolus  Patient Measurements: Height: 5\' 9"  (175.3 cm) Weight: 73.4 kg (161 lb 13.1 oz) IBW/kg (Calculated) : 70.7 Heparin Dosing Weight: 79.2  Labs: Recent Labs    02/12/23 1739 02/12/23 1931 02/13/23 0459 02/13/23 0459 02/13/23 1428 02/13/23 2354 02/14/23 0413 02/14/23 0814  HGB 12.0*  --  12.1*  --   --   --   --   --   HCT 36.8*  --  37.7*  --   --   --   --   --   PLT 201  --  192  --   --   --   --   --   APTT 40*  --   --   --   --   --   --   --   LABPROT 16.1*  --   --   --   --   --   --   --   INR 1.3*  --   --   --   --   --   --   --   HEPARINUNFRC  --   --  0.18*   < > 0.18* 0.80*  --  0.71*  CREATININE 1.91*  --  1.59*  --   --   --  1.38*  --   TROPONINIHS 86* 76*  --   --   --   --   --   --    < > = values in this interval not displayed.   Estimated Creatinine Clearance: 46.3 mL/min (A) (by C-G formula based on SCr of 1.38 mg/dL (H)).  Medical History: Past Medical History:  Diagnosis Date   Cancer (HCC) 2012   Stage IIA squamous cell Lung Ca   CHF (congestive heart failure) (HCC) 01/28/2017   Diabetes mellitus    Gout    Hyperlipidemia    Hypertension    Myocardial infarction [facility])    Thyroid cancer (HCC) 2013   s/p thyroidectomy 2014    Medications:  No anticoagulation prior to admission per my chart review. Medication reconciliation is pending  Assessment: 77 y/o M with medical history as above presenting with shortness of breath and hemoptysis. Pharmacy consulted to initiate heparin infusion for PE.    Goal of Therapy:  Heparin level 0.3-0.7 units/ml Monitor platelets by anticoagulation protocol: Yes   08/21 0459 HL 0.18, subtherapeutic 08/21 1428 HL 0.18, subtherapeutic 08/22 2354 HL 0.80, supratherapeutic 08/22 0814 HL 0.71  Plan:  Switching to apixaban 10 mg BID x 7 days followed by 5 mg BID.   Paschal Dopp, PharmD 02/14/2023 9:52 AM

## 2023-02-14 NOTE — Consult Note (Signed)
ANTICOAGULATION CONSULT NOTE  Pharmacy Consult for IV Heparin Indication: pulmonary embolus  Patient Measurements: Height: 5\' 9"  (175.3 cm) Weight: 79.2 kg (174 lb 9.6 oz) IBW/kg (Calculated) : 70.7 Heparin Dosing Weight: 79.2  Labs: Recent Labs    02/12/23 1739 02/12/23 1931 02/13/23 0459 02/13/23 1428 02/13/23 2354  HGB 12.0*  --  12.1*  --   --   HCT 36.8*  --  37.7*  --   --   PLT 201  --  192  --   --   APTT 40*  --   --   --   --   LABPROT 16.1*  --   --   --   --   INR 1.3*  --   --   --   --   HEPARINUNFRC  --   --  0.18* 0.18* 0.80*  CREATININE 1.91*  --  1.59*  --   --   TROPONINIHS 86* 76*  --   --   --    Estimated Creatinine Clearance: 40.1 mL/min (A) (by C-G formula based on SCr of 1.59 mg/dL (H)).  Medical History: Past Medical History:  Diagnosis Date   Cancer (HCC) 2012   Stage IIA squamous cell Lung Ca   CHF (congestive heart failure) (HCC) 01/28/2017   Diabetes mellitus    Gout    Hyperlipidemia    Hypertension    Myocardial infarction [facility])    Thyroid cancer (HCC) 2013   s/p thyroidectomy 2014    Medications:  No anticoagulation prior to admission per my chart review. Medication reconciliation is pending  Assessment: 77 y/o M with medical history as above presenting with shortness of breath and hemoptysis. Pharmacy consulted to initiate heparin infusion for PE.   Baseline aPTT 40s (elevated), INR 1.3 (elevated). Baseline H&H acceptable.  Goal of Therapy:  Heparin level 0.3-0.7 units/ml Monitor platelets by anticoagulation protocol: Yes   08/21 0459 HL 0.18, subtherapeutic 08/21 1428 HL 0.18, subtherapeutic 08/22 2354 HL 0.80, supratherapeutic  Plan:  --Decrease heparin infusion to 1700 units/hr --Recheck HL in 8 hours after rate change --Daily CBC per protocol while on IV heparin  Otelia Sergeant, PharmD, [facility] 02/14/2023 12:43 AM

## 2023-02-14 NOTE — Assessment & Plan Note (Signed)
Resolved.  Patient is on room air now ?

## 2023-02-14 NOTE — Assessment & Plan Note (Signed)
Status post right-sided thoracotomy and lower lobectomy in 09/2011 History of mediastinal lymphadenectomy Status post chemotherapy Concern of recurrence-oncology will follow-up as outpatient and repeat imaging in few weeks

## 2023-02-14 NOTE — Progress Notes (Signed)
SATURATION QUALIFICATIONS: (This note is used to comply with regulatory documentation for home oxygen)  Patient Saturations on Room Air at Rest = 94%  Patient Saturations on Room Air while Ambulating = 92%  Patient Saturations on 0 Liters of oxygen while Ambulating = 92%  Please briefly explain why patient needs home oxygen:Ambulatory oxygen fluctuated between 92% - 97%

## 2023-02-14 NOTE — Assessment & Plan Note (Signed)
No CT evidence of right heart strain.  Lower extremity venous Doppler negative for DVT.  Echo with normal EF, grade 1 diastolic dysfunction Switched heparin with Eliquis

## 2023-02-14 NOTE — TOC Benefit Eligibility Note (Signed)
Patient Product/process development scientist completed.    The patient is insured through [employer]/ [employer]. Patient has Medicare and is not eligible for a copay card, but may be able to apply for patient assistance, if available.    Ran test claim for Eliquis 5 mg and the current 30 day co-pay is $47.00 .   This test claim was processed through [facility]- copay amounts may vary at other pharmacies due to pharmacy/plan contracts, or as the patient moves through the different stages of their insurance plan.     Roland Earl, CPHT Pharmacy Technician III Certified Patient Advocate [facility] Pharmacy Patient Advocate Team Direct Number: [phone]  Fax: [phone]

## 2023-02-14 NOTE — Assessment & Plan Note (Signed)
Per CTA chest read: Interval increased size of subcarinal and right hilar lymph nodes, could be reactive although metastatic disease can not be excluded. New large subpleural nodule of the superior portion of the left lower lobe, concerning for metastatic disease.  Patient formally saw Dr. Orlie Dakin and is requesting Dr. Orlie Dakin to be consulted, saw the patient and they will follow-up as outpatient

## 2023-02-15 ENCOUNTER — Telehealth: Payer: Self-pay

## 2023-02-15 NOTE — Transitions of Care (Post Inpatient/ED Visit) (Signed)
02/15/2023  Name: Nathaniel Lane MRN: [id number] DOB: Nov 28, 1945  Today's TOC FU Call Status: Today's TOC FU Call Status:: Successful TOC FU Call Completed TOC FU Call Complete Date: 02/15/23 Patient's Name and Date of Birth confirmed.  Transition Care Management Follow-up Telephone Call Date of Discharge: 02/14/23 Discharge Facility: [facility]) Type of Discharge: Inpatient Admission Primary Inpatient Discharge Diagnosis:: Acute Pulmonary Embolism How have you been since you were released from the hospital?: Better Any questions or concerns?: No  Items Reviewed: Did you receive and understand the discharge instructions provided?: Yes Medications obtained,verified, and reconciled?: Yes (Medications Reviewed) Any new allergies since your discharge?: No Dietary orders reviewed?: Yes Type of Diet Ordered:: Acute Pulmonary Embolism Do you have support at home?: Yes People in Home: child(ren), adult Name of Support/Comfort Primary Source: [name]  Medications Reviewed Today: Medications Reviewed Today     Reviewed by Jodelle Gross, RN (Case Manager) on 02/15/23 at 1308  Med List Status: <None>   Medication Order Taking? Sig Documenting Provider Last Dose Status Informant  apixaban (ELIQUIS) 5 MG TABS tablet [id number] Yes Take 1 tablet (5 mg total) by mouth 2 (two) times daily. Started after completing starter pack Arnetha Courser, MD Taking Active   APIXABAN (ELIQUIS) VTE STARTER PACK (10MG  AND 5MG ) [id number] Yes Take as directed on package: start with two-5mg  tablets twice daily for 7 days. On day 8, switch to one-5mg  tablet twice daily. Arnetha Courser, MD Taking Active   atorvastatin (LIPITOR) 40 MG tablet [id number] Yes TAKE 1 TABLET BY MOUTH EVERY DAY Sherlene Shams, MD Taking Active Self  blood glucose meter kit and supplies KIT [id number] Yes Dispense based on patient and insurance preference. Use up to four times daily as directed. (FOR ICD-10 E11.29).  Sherlene Shams, MD Taking Active Self  gabapentin (NEURONTIN) 300 MG capsule [id number] Yes TAKE 1 CAPSULE BY MOUTH FOUR TIMES A DAY Sherlene Shams, MD Taking Active Self           Med Note Sharia Reeve   Wed Feb 13, 2023  1:54 PM) Patient takes 600 mg every morning and 600 mg at bedtime  glipiZIDE (GLUCOTROL XL) 5 MG 24 hr tablet [id number] Yes TAKE 1 TABLET BY MOUTH EVERY DAY WITH BREAKFAST Sherlene Shams, MD Taking Active Self  levofloxacin (LEVAQUIN) 750 MG tablet [id number] Yes Take 1 tablet (750 mg total) by mouth every other day for 3 doses. Arnetha Courser, MD Taking Active   levothyroxine (SYNTHROID) 137 MCG tablet [id number] Yes Take 1 tablet (137 mcg total) by mouth daily before breakfast. Arnetha Courser, MD Taking Active   losartan (COZAAR) 50 MG tablet [id number] Yes TAKE 1 TABLET BY MOUTH EVERY DAY Sherlene Shams, MD Taking Active Self  metFORMIN (GLUCOPHAGE-XR) 750 MG 24 hr tablet [id number] Yes TAKE 1 TABLET BY MOUTH EVERY DAY WITH BREAKFAST Sherlene Shams, MD Taking Active Self  [facility] LANCETS FINE [place] [id number] Yes USE AS DIRECTED 4 TIMES A DAY Sherlene Shams, MD Taking Active Self  [facility] ULTRA test strip [id number] Yes USE AS DIRECTED 4 TIMES A DAY Sherlene Shams, MD Taking Active Self  tamsulosin (FLOMAX) 0.4 MG CAPS capsule [id number] Yes Take 1 capsule (0.4 mg total) by mouth daily. Sherlene Shams, MD Taking Active Self            Home Care and Equipment/Supplies: Were Home Health Services Ordered?: No Any new equipment or medical supplies ordered?: No  Functional Questionnaire: Do  you need assistance with bathing/showering or dressing?: No Do you need assistance with meal preparation?: No Do you need assistance with eating?: No Do you have difficulty maintaining continence: No Do you need assistance with getting out of bed/getting out of a chair/moving?: No Do you have difficulty managing or taking your medications?: No  Follow up appointments  reviewed: PCP Follow-up appointment confirmed?: NA Specialist Hospital Follow-up appointment confirmed?: Yes Date of Specialist follow-up appointment?: 02/21/23 Follow-Up Specialty Provider:: Dr Ann Maki Do you need transportation to your follow-up appointment?: No Do you understand care options if your condition(s) worsen?: Yes-patient verbalized understanding  SDOH Interventions Today    Flowsheet Row Most Recent Value  SDOH Interventions   Food Insecurity Interventions Intervention Not Indicated  Housing Interventions Intervention Not Indicated     Jodelle Gross RN, BSN, CCM [facility] RN Care Coordinator/ Transitions of Care Direct Dial: [phone]  Fax: [phone]

## 2023-02-17 LAB — CULTURE, BLOOD (ROUTINE X 2)
Culture: NO GROWTH
Culture: NO GROWTH

## 2023-02-18 ENCOUNTER — Telehealth: Payer: Self-pay | Admitting: Internal Medicine

## 2023-02-18 DIAGNOSIS — N2889 Other specified disorders of kidney and ureter: Secondary | ICD-10-CM

## 2023-02-18 DIAGNOSIS — D126 Benign neoplasm of colon, unspecified: Secondary | ICD-10-CM

## 2023-02-18 DIAGNOSIS — Z7901 Long term (current) use of anticoagulants: Secondary | ICD-10-CM

## 2023-02-18 DIAGNOSIS — J449 Chronic obstructive pulmonary disease, unspecified: Secondary | ICD-10-CM

## 2023-02-18 NOTE — Telephone Encounter (Signed)
First available appt is 03/08/2023 at 11 am. Is this okay?

## 2023-02-18 NOTE — Telephone Encounter (Signed)
Patient called for a hospital follow up, no available appointments with provider.

## 2023-02-19 NOTE — Telephone Encounter (Signed)
Patient just called. I scheduled him a hospital follow up on 03/08/2023 at 11.

## 2023-02-20 NOTE — Telephone Encounter (Signed)
Spoke with pt and scheduled him for a lab appt tomorrow.

## 2023-02-20 NOTE — Addendum Note (Signed)
Addended by: Sherlene Shams on: 02/20/2023 08:38 AM   Modules accepted: Orders

## 2023-02-21 ENCOUNTER — Other Ambulatory Visit (INDEPENDENT_AMBULATORY_CARE_PROVIDER_SITE_OTHER): Payer: PPO

## 2023-02-21 DIAGNOSIS — E785 Hyperlipidemia, unspecified: Secondary | ICD-10-CM | POA: Diagnosis not present

## 2023-02-21 DIAGNOSIS — I509 Heart failure, unspecified: Secondary | ICD-10-CM | POA: Diagnosis not present

## 2023-02-21 DIAGNOSIS — N2889 Other specified disorders of kidney and ureter: Secondary | ICD-10-CM

## 2023-02-21 DIAGNOSIS — I6523 Occlusion and stenosis of bilateral carotid arteries: Secondary | ICD-10-CM | POA: Diagnosis not present

## 2023-02-21 DIAGNOSIS — I10 Essential (primary) hypertension: Secondary | ICD-10-CM | POA: Diagnosis not present

## 2023-02-21 DIAGNOSIS — I2510 Atherosclerotic heart disease of native coronary artery without angina pectoris: Secondary | ICD-10-CM | POA: Diagnosis not present

## 2023-02-21 DIAGNOSIS — Z7901 Long term (current) use of anticoagulants: Secondary | ICD-10-CM | POA: Diagnosis not present

## 2023-02-21 DIAGNOSIS — Z86711 Personal history of pulmonary embolism: Secondary | ICD-10-CM | POA: Diagnosis not present

## 2023-02-21 DIAGNOSIS — I255 Ischemic cardiomyopathy: Secondary | ICD-10-CM | POA: Diagnosis not present

## 2023-02-21 LAB — CBC WITH DIFFERENTIAL/PLATELET
Basophils Absolute: 0.1 10*3/uL (ref 0.0–0.1)
Basophils Relative: 0.5 % (ref 0.0–3.0)
Eosinophils Absolute: 0.2 10*3/uL (ref 0.0–0.7)
Eosinophils Relative: 1.5 % (ref 0.0–5.0)
HCT: 38 % — ABNORMAL LOW (ref 39.0–52.0)
Hemoglobin: 12.1 g/dL — ABNORMAL LOW (ref 13.0–17.0)
Lymphocytes Relative: 15.6 % (ref 12.0–46.0)
Lymphs Abs: 1.9 10*3/uL (ref 0.7–4.0)
MCHC: 31.9 g/dL (ref 30.0–36.0)
MCV: 89.6 fl (ref 78.0–100.0)
Monocytes Absolute: 0.9 10*3/uL (ref 0.1–1.0)
Monocytes Relative: 7.3 % (ref 3.0–12.0)
Neutro Abs: 9.2 10*3/uL — ABNORMAL HIGH (ref 1.4–7.7)
Neutrophils Relative %: 75.1 % (ref 43.0–77.0)
Platelets: 287 10*3/uL (ref 150.0–400.0)
RBC: 4.24 Mil/uL (ref 4.22–5.81)
RDW: 13.7 % (ref 11.5–15.5)
WBC: 12.2 10*3/uL — ABNORMAL HIGH (ref 4.0–10.5)

## 2023-02-21 LAB — BASIC METABOLIC PANEL
BUN: 29 mg/dL — ABNORMAL HIGH (ref 6–23)
CO2: 26 mEq/L (ref 19–32)
Calcium: 8.6 mg/dL (ref 8.4–10.5)
Chloride: 100 mEq/L (ref 96–112)
Creatinine, Ser: 1.69 mg/dL — ABNORMAL HIGH (ref 0.40–1.50)
GFR: 39.31 mL/min — ABNORMAL LOW (ref >60.00)
Glucose, Bld: 212 mg/dL — ABNORMAL HIGH (ref 70–99)
Potassium: 4.9 mEq/L (ref 3.5–5.1)
Sodium: 134 mEq/L — ABNORMAL LOW (ref 135–145)

## 2023-02-22 ENCOUNTER — Other Ambulatory Visit: Payer: Self-pay | Admitting: Internal Medicine

## 2023-03-04 DIAGNOSIS — M25511 Pain in right shoulder: Secondary | ICD-10-CM | POA: Diagnosis not present

## 2023-03-04 DIAGNOSIS — M7541 Impingement syndrome of right shoulder: Secondary | ICD-10-CM | POA: Diagnosis not present

## 2023-03-08 ENCOUNTER — Encounter: Payer: Self-pay | Admitting: Internal Medicine

## 2023-03-08 ENCOUNTER — Ambulatory Visit: Payer: PPO | Admitting: Internal Medicine

## 2023-03-08 VITALS — BP 116/70 | HR 90 | Temp 97.6°F | Ht 69.0 in | Wt 167.0 lb

## 2023-03-08 DIAGNOSIS — Z09 Encounter for follow-up examination after completed treatment for conditions other than malignant neoplasm: Secondary | ICD-10-CM | POA: Diagnosis not present

## 2023-03-08 DIAGNOSIS — N2889 Other specified disorders of kidney and ureter: Secondary | ICD-10-CM

## 2023-03-08 DIAGNOSIS — Z7901 Long term (current) use of anticoagulants: Secondary | ICD-10-CM | POA: Diagnosis not present

## 2023-03-08 DIAGNOSIS — N183 Chronic kidney disease, stage 3 unspecified: Secondary | ICD-10-CM | POA: Diagnosis not present

## 2023-03-08 DIAGNOSIS — E1122 Type 2 diabetes mellitus with diabetic chronic kidney disease: Secondary | ICD-10-CM

## 2023-03-08 DIAGNOSIS — J9601 Acute respiratory failure with hypoxia: Secondary | ICD-10-CM

## 2023-03-08 DIAGNOSIS — E1121 Type 2 diabetes mellitus with diabetic nephropathy: Secondary | ICD-10-CM

## 2023-03-08 DIAGNOSIS — E785 Hyperlipidemia, unspecified: Secondary | ICD-10-CM | POA: Diagnosis not present

## 2023-03-08 DIAGNOSIS — E1169 Type 2 diabetes mellitus with other specified complication: Secondary | ICD-10-CM

## 2023-03-08 DIAGNOSIS — I2699 Other pulmonary embolism without acute cor pulmonale: Secondary | ICD-10-CM

## 2023-03-08 LAB — COMPREHENSIVE METABOLIC PANEL
ALT: 19 U/L (ref 0–53)
AST: 13 U/L (ref 0–37)
Albumin: 3.5 g/dL (ref 3.5–5.2)
Alkaline Phosphatase: 103 U/L (ref 39–117)
BUN: 35 mg/dL — ABNORMAL HIGH (ref 6–23)
CO2: 30 meq/L (ref 19–32)
Calcium: 9.6 mg/dL (ref 8.4–10.5)
Chloride: 101 meq/L (ref 96–112)
Creatinine, Ser: 1.72 mg/dL — ABNORMAL HIGH (ref 0.40–1.50)
GFR: 38.47 mL/min — ABNORMAL LOW (ref >60.00)
Glucose, Bld: 83 mg/dL (ref 70–99)
Potassium: 4.7 meq/L (ref 3.5–5.1)
Sodium: 137 meq/L (ref 135–145)
Total Bilirubin: 0.4 mg/dL (ref 0.2–1.2)
Total Protein: 7.8 g/dL (ref 6.0–8.3)

## 2023-03-08 LAB — CBC WITH DIFFERENTIAL/PLATELET
Basophils Absolute: 0 10*3/uL (ref 0.0–0.1)
Basophils Relative: 0.3 % (ref 0.0–3.0)
Eosinophils Absolute: 0.2 10*3/uL (ref 0.0–0.7)
Eosinophils Relative: 1.3 % (ref 0.0–5.0)
HCT: 42.5 % (ref 39.0–52.0)
Hemoglobin: 13.8 g/dL (ref 13.0–17.0)
Lymphocytes Relative: 19 % (ref 12.0–46.0)
Lymphs Abs: 2.3 10*3/uL (ref 0.7–4.0)
MCHC: 32.3 g/dL (ref 30.0–36.0)
MCV: 88.2 fl (ref 78.0–100.0)
Monocytes Absolute: 1.2 10*3/uL — ABNORMAL HIGH (ref 0.1–1.0)
Monocytes Relative: 9.8 % (ref 3.0–12.0)
Neutro Abs: 8.5 10*3/uL — ABNORMAL HIGH (ref 1.4–7.7)
Neutrophils Relative %: 69.6 % (ref 43.0–77.0)
Platelets: 200 10*3/uL (ref 150.0–400.0)
RBC: 4.82 Mil/uL (ref 4.22–5.81)
RDW: 14.2 % (ref 11.5–15.5)
WBC: 12.2 10*3/uL — ABNORMAL HIGH (ref 4.0–10.5)

## 2023-03-08 LAB — HEMOGLOBIN A1C: Hgb A1c MFr Bld: 7 % — ABNORMAL HIGH (ref 4.6–6.5)

## 2023-03-08 MED ORDER — APIXABAN 5 MG PO TABS: 5.0000 mg | ORAL_TABLET | Freq: Two times a day (BID) | ORAL | 5 refills | Status: DC

## 2023-03-08 MED ORDER — GABAPENTIN 300 MG PO CAPS: 300.0000 mg | ORAL_CAPSULE | Freq: Four times a day (QID) | ORAL | 0 refills | Status: DC

## 2023-03-08 NOTE — Assessment & Plan Note (Signed)
Avoidance of NSAIDs reviewed.  Stable by recent labs.   Continue semi annual follow up  with his nephrologist.

## 2023-03-08 NOTE — Assessment & Plan Note (Signed)
Serial surveillance with annual MRIs done June 2024 ordered by [facility]

## 2023-03-08 NOTE — Assessment & Plan Note (Signed)
Resolved ,  secondary to pneumonia and acute right sided PE.  Room air sats 96%.  Repeat CT chest scheduled in october

## 2023-03-08 NOTE — Progress Notes (Unsigned)
90 capsule 1   APIXABAN (ELIQUIS) VTE STARTER PACK (10MG  AND 5MG ) Take as directed on package: start with two-5mg  tablets twice daily for 7 days. On day 8, switch to one-5mg  tablet twice daily. 74 each 0   No facility-administered medications prior to visit.    Review of Systems;  Patient denies headache, fevers, malaise, unintentional weight loss, skin rash, eye pain, sinus congestion and sinus pain, sore throat, dysphagia,  hemoptysis , cough, dyspnea, wheezing, chest pain, palpitations, orthopnea, edema, abdominal pain, nausea, melena, diarrhea, constipation, flank pain, dysuria, hematuria, urinary  Frequency, nocturia, numbness, tingling, seizures,  Focal weakness, Loss of consciousness,  Tremor, insomnia, depression, anxiety, and suicidal ideation.      Objective:  BP 116/70   Pulse 90    Temp 97.6 F (36.4 C) (Oral)   Ht 5\' 9"  (1.753 m)   Wt 167 lb (75.8 kg)   SpO2 96%   BMI 24.66 kg/m   BP Readings from Last 3 Encounters:  03/08/23 116/70  02/14/23 116/68  11/09/22 130/76    Wt Readings from Last 3 Encounters:  03/08/23 167 lb (75.8 kg)  02/14/23 161 lb 13.1 oz (73.4 kg)  11/09/22 166 lb 12.8 oz (75.7 kg)    Physical Exam  Lab Results  Component Value Date   HGBA1C 6.3 11/09/2022   HGBA1C 6.2 (A) 05/10/2022   HGBA1C 6.2 01/26/2022    Lab Results  Component Value Date   CREATININE 1.69 (H) 02/21/2023   CREATININE 1.38 (H) 02/14/2023   CREATININE 1.59 (H) 02/13/2023    Lab Results  Component Value Date   WBC 12.2 (H) 02/21/2023   HGB 12.1 (L) 02/21/2023   HCT 38.0 (L) 02/21/2023   PLT 287.0 02/21/2023   GLUCOSE 212 (H) 02/21/2023   CHOL 150 11/09/2022   TRIG 180.0 (H) 11/09/2022   HDL 34.00 (L) 11/09/2022   LDLDIRECT 85.0 05/10/2022   LDLCALC 80 11/09/2022   ALT 15 11/09/2022   AST 16 11/09/2022   NA 134 (L) 02/21/2023   K 4.9 02/21/2023   CL 100 02/21/2023   CREATININE 1.69 (H) 02/21/2023   BUN 29 (H) 02/21/2023   CO2 26 02/21/2023   TSH 14.40 (A) 08/06/2022   PSA 1.14 11/18/2019   INR 1.3 (H) 02/12/2023   HGBA1C 6.3 11/09/2022   MICROALBUR 2.6 (H) 01/31/2022    ECHOCARDIOGRAM COMPLETE  Result Date: 02/13/2023    ECHOCARDIOGRAM REPORT   Patient Name:   Nathaniel Lane Date of Exam: 02/13/2023 Medical Rec #:  [id number]     Height:       69.0 in Accession #:    [id number]    Weight:       174.6 lb Date of Birth:  1945-11-24     BSA:          1.950 m Patient Age:    77 years      BP:           136/68 mmHg Patient Gender: M             HR:           73 bpm. Exam Location:  [facility] Procedure: 2D Echo, Cardiac Doppler and Color Doppler Indications:     Pulmonary Embolus I26.09  History:         Patient has no prior history of Echocardiogram examinations.                  CHF, Previous Myocardial Infarction; Risk Factors:Diabetes and  90 capsule 1   APIXABAN (ELIQUIS) VTE STARTER PACK (10MG  AND 5MG ) Take as directed on package: start with two-5mg  tablets twice daily for 7 days. On day 8, switch to one-5mg  tablet twice daily. 74 each 0   No facility-administered medications prior to visit.    Review of Systems;  Patient denies headache, fevers, malaise, unintentional weight loss, skin rash, eye pain, sinus congestion and sinus pain, sore throat, dysphagia,  hemoptysis , cough, dyspnea, wheezing, chest pain, palpitations, orthopnea, edema, abdominal pain, nausea, melena, diarrhea, constipation, flank pain, dysuria, hematuria, urinary  Frequency, nocturia, numbness, tingling, seizures,  Focal weakness, Loss of consciousness,  Tremor, insomnia, depression, anxiety, and suicidal ideation.      Objective:  BP 116/70   Pulse 90    Temp 97.6 F (36.4 C) (Oral)   Ht 5\' 9"  (1.753 m)   Wt 167 lb (75.8 kg)   SpO2 96%   BMI 24.66 kg/m   BP Readings from Last 3 Encounters:  03/08/23 116/70  02/14/23 116/68  11/09/22 130/76    Wt Readings from Last 3 Encounters:  03/08/23 167 lb (75.8 kg)  02/14/23 161 lb 13.1 oz (73.4 kg)  11/09/22 166 lb 12.8 oz (75.7 kg)    Physical Exam  Lab Results  Component Value Date   HGBA1C 6.3 11/09/2022   HGBA1C 6.2 (A) 05/10/2022   HGBA1C 6.2 01/26/2022    Lab Results  Component Value Date   CREATININE 1.69 (H) 02/21/2023   CREATININE 1.38 (H) 02/14/2023   CREATININE 1.59 (H) 02/13/2023    Lab Results  Component Value Date   WBC 12.2 (H) 02/21/2023   HGB 12.1 (L) 02/21/2023   HCT 38.0 (L) 02/21/2023   PLT 287.0 02/21/2023   GLUCOSE 212 (H) 02/21/2023   CHOL 150 11/09/2022   TRIG 180.0 (H) 11/09/2022   HDL 34.00 (L) 11/09/2022   LDLDIRECT 85.0 05/10/2022   LDLCALC 80 11/09/2022   ALT 15 11/09/2022   AST 16 11/09/2022   NA 134 (L) 02/21/2023   K 4.9 02/21/2023   CL 100 02/21/2023   CREATININE 1.69 (H) 02/21/2023   BUN 29 (H) 02/21/2023   CO2 26 02/21/2023   TSH 14.40 (A) 08/06/2022   PSA 1.14 11/18/2019   INR 1.3 (H) 02/12/2023   HGBA1C 6.3 11/09/2022   MICROALBUR 2.6 (H) 01/31/2022    ECHOCARDIOGRAM COMPLETE  Result Date: 02/13/2023    ECHOCARDIOGRAM REPORT   Patient Name:   Nathaniel Lane Date of Exam: 02/13/2023 Medical Rec #:  [id number]     Height:       69.0 in Accession #:    [id number]    Weight:       174.6 lb Date of Birth:  1945-11-24     BSA:          1.950 m Patient Age:    77 years      BP:           136/68 mmHg Patient Gender: M             HR:           73 bpm. Exam Location:  [facility] Procedure: 2D Echo, Cardiac Doppler and Color Doppler Indications:     Pulmonary Embolus I26.09  History:         Patient has no prior history of Echocardiogram examinations.                  CHF, Previous Myocardial Infarction; Risk Factors:Diabetes and  Subjective:  [name], male    DOB: [birth year]  Age: 77 y.o. MRN: [id number]  CC: {There were no encounter diagnoses. (Refresh or delete this SmartLink)}   HPI Nathaniel Lane presents for  Chief Complaint  Patient presents with   Hospitalization Follow-up    Hospitalized at [facility] from August 20 to 22 after presenting with malaise, nausea and sudden onset of  hemoptysis .which occurred during a business trip to [place],  [place] home and called office, directed to ER.  He was diagnosed with acute PE resulting in hypoxic respiratory failure.  CT showed a right-sided pulmonary embolism as well as  New consolidations in posterior right sided upper and middle lobes, middle lobes worse than upper lobe;   Interval increased size of subcarinal and right hilar lymph nodes, could be reactive although metastatic disease could  not be excluded, and a .  New large subpleural nodule of the superior portion of the left lower lobe, concerning for metastatic disease.    LE were negative for DVT.  ECHO was done, EF was normal .  . He was treated for multilobar pneumonia as well and was discharged in improved condition on levaquin x 3 more doses  and starting dose of Eliquis,  asa was stopped  now taking 5 mg twice daliy  Since discharge he has improved clinically, has  seen cardiology and had follow up labs.   Has appt with Orlie Dakin in October 10 after a repeat CT   on Oct 3  .  Advised not to fly until after his chest CT ) . Appetite has been oK<   .  Eating 2-3 meals daily    Since last visit had MRI abdomen  in June ordered by Urology:  small renal cell CA suspected right kidney vs oncocytoma.   Has not seen urology yet.  Type 2 DM: has not checked BS level since leaving hospital   Lab Results  Component Value Date   HGBA1C 6.3 11/09/2022       Outpatient Medications Prior to Visit  Medication Sig Dispense Refill   apixaban (ELIQUIS) 5 MG TABS tablet Take 1 tablet (5 mg total) by  mouth 2 (two) times daily. Started after completing starter pack 60 tablet 1   atorvastatin (LIPITOR) 40 MG tablet TAKE 1 TABLET BY MOUTH EVERY DAY 90 tablet 3   blood glucose meter kit and supplies KIT Dispense based on patient and insurance preference. Use up to four times daily as directed. (FOR ICD-10 E11.29). 1 each 0   gabapentin (NEURONTIN) 300 MG capsule TAKE 1 CAPSULE BY MOUTH FOUR TIMES A DAY 360 capsule 0   glipiZIDE (GLUCOTROL XL) 5 MG 24 hr tablet TAKE 1 TABLET BY MOUTH EVERY DAY WITH BREAKFAST 90 tablet 3   levothyroxine (SYNTHROID) 137 MCG tablet Take 1 tablet (137 mcg total) by mouth daily before breakfast. 90 tablet 1   losartan (COZAAR) 50 MG tablet TAKE 1 TABLET BY MOUTH EVERY DAY 90 tablet 3   metFORMIN (GLUCOPHAGE-XR) 750 MG 24 hr tablet TAKE 1 TABLET BY MOUTH EVERY DAY WITH BREAKFAST 90 tablet 1   omeprazole (PRILOSEC) 20 MG capsule Take 20 mg by mouth daily.     ONETOUCH DELICA LANCETS FINE MISC USE AS DIRECTED 4 TIMES A DAY 100 each 2   ONETOUCH ULTRA test strip USE AS DIRECTED 4 TIMES A DAY 100 strip 2   tamsulosin (FLOMAX) 0.4 MG CAPS capsule TAKE 1 CAPSULE BY MOUTH EVERY DAY  90 capsule 1   APIXABAN (ELIQUIS) VTE STARTER PACK (10MG  AND 5MG ) Take as directed on package: start with two-5mg  tablets twice daily for 7 days. On day 8, switch to one-5mg  tablet twice daily. 74 each 0   No facility-administered medications prior to visit.    Review of Systems;  Patient denies headache, fevers, malaise, unintentional weight loss, skin rash, eye pain, sinus congestion and sinus pain, sore throat, dysphagia,  hemoptysis , cough, dyspnea, wheezing, chest pain, palpitations, orthopnea, edema, abdominal pain, nausea, melena, diarrhea, constipation, flank pain, dysuria, hematuria, urinary  Frequency, nocturia, numbness, tingling, seizures,  Focal weakness, Loss of consciousness,  Tremor, insomnia, depression, anxiety, and suicidal ideation.      Objective:  BP 116/70   Pulse 90    Temp 97.6 F (36.4 C) (Oral)   Ht 5\' 9"  (1.753 m)   Wt 167 lb (75.8 kg)   SpO2 96%   BMI 24.66 kg/m   BP Readings from Last 3 Encounters:  03/08/23 116/70  02/14/23 116/68  11/09/22 130/76    Wt Readings from Last 3 Encounters:  03/08/23 167 lb (75.8 kg)  02/14/23 161 lb 13.1 oz (73.4 kg)  11/09/22 166 lb 12.8 oz (75.7 kg)    Physical Exam  Lab Results  Component Value Date   HGBA1C 6.3 11/09/2022   HGBA1C 6.2 (A) 05/10/2022   HGBA1C 6.2 01/26/2022    Lab Results  Component Value Date   CREATININE 1.69 (H) 02/21/2023   CREATININE 1.38 (H) 02/14/2023   CREATININE 1.59 (H) 02/13/2023    Lab Results  Component Value Date   WBC 12.2 (H) 02/21/2023   HGB 12.1 (L) 02/21/2023   HCT 38.0 (L) 02/21/2023   PLT 287.0 02/21/2023   GLUCOSE 212 (H) 02/21/2023   CHOL 150 11/09/2022   TRIG 180.0 (H) 11/09/2022   HDL 34.00 (L) 11/09/2022   LDLDIRECT 85.0 05/10/2022   LDLCALC 80 11/09/2022   ALT 15 11/09/2022   AST 16 11/09/2022   NA 134 (L) 02/21/2023   K 4.9 02/21/2023   CL 100 02/21/2023   CREATININE 1.69 (H) 02/21/2023   BUN 29 (H) 02/21/2023   CO2 26 02/21/2023   TSH 14.40 (A) 08/06/2022   PSA 1.14 11/18/2019   INR 1.3 (H) 02/12/2023   HGBA1C 6.3 11/09/2022   MICROALBUR 2.6 (H) 01/31/2022    ECHOCARDIOGRAM COMPLETE  Result Date: 02/13/2023    ECHOCARDIOGRAM REPORT   Patient Name:   Nathaniel Lane Date of Exam: 02/13/2023 Medical Rec #:  [id number]     Height:       69.0 in Accession #:    [id number]    Weight:       174.6 lb Date of Birth:  1945-11-24     BSA:          1.950 m Patient Age:    77 years      BP:           136/68 mmHg Patient Gender: M             HR:           73 bpm. Exam Location:  [facility] Procedure: 2D Echo, Cardiac Doppler and Color Doppler Indications:     Pulmonary Embolus I26.09  History:         Patient has no prior history of Echocardiogram examinations.                  CHF, Previous Myocardial Infarction; Risk Factors:Diabetes and

## 2023-03-08 NOTE — Patient Instructions (Signed)
I'm glad you are feeling better.  Because you were prescribed levaquin,  a probiotic,  I want you to take a probiotic ( Align, Floraque or Culturelle) or the generic version once a day for the next 2 weeks   to prevent a serious antibiotic associated diarrhea  Called clostirudium dificile colitis   I would not fly until you are cleared by Dr Orlie Dakin

## 2023-03-08 NOTE — Assessment & Plan Note (Addendum)
Occurred  in August  during travel to [place],  Right lung,  attributed to occupational driving and potential return of lung CA .  Continue Eliquis for a minium of 6 months (longer if CA is confirmed)

## 2023-03-09 LAB — LIPID PANEL W/REFLEX DIRECT LDL
Cholesterol: 167 mg/dL (ref <200)
HDL: 33 mg/dL — ABNORMAL LOW (ref >=40)
LDL Cholesterol (Calc): 89 mg/dL
Non-HDL Cholesterol (Calc): 134 mg/dL — ABNORMAL HIGH (ref <130)
Total CHOL/HDL Ratio: 5.1 (calc) — ABNORMAL HIGH (ref <5.0)
Triglycerides: 337 mg/dL — ABNORMAL HIGH (ref <150)

## 2023-03-10 ENCOUNTER — Other Ambulatory Visit: Payer: Self-pay | Admitting: Internal Medicine

## 2023-03-10 MED ORDER — EMPAGLIFLOZIN 10 MG PO TABS: 10.0000 mg | ORAL_TABLET | Freq: Every day | ORAL | 2 refills | Status: DC

## 2023-03-10 NOTE — Assessment & Plan Note (Signed)
Currently well-controlled on current medications .  hemoglobin A1c is at goal of 7.0 . Patient is reminded to schedule an annual eye exam and foot exam is normal today. Patient has no microalbuminuria. Patient is tolerating statin therapy for CAD risk reduction and on ACE/ARB for renal protection and hypertension   Lab Results  Component Value Date   HGBA1C 7.0 (H) 03/08/2023   Lab Results  Component Value Date   CHOL 167 03/08/2023   HDL 33 (L) 03/08/2023   LDLCALC 89 03/08/2023   LDLDIRECT 85.0 05/10/2022   TRIG 337 (H) 03/08/2023   CHOLHDL 5.1 (H) 03/08/2023   Lab Results  Component Value Date   ALT 19 03/08/2023   AST 13 03/08/2023   ALKPHOS 103 03/08/2023   BILITOT 0.4 03/08/2023

## 2023-03-10 NOTE — Assessment & Plan Note (Signed)
Patient is stable post discharge and has no new issues or questions about discharge plans at the visit today for hospital follow up. All labs , imaging studies and progress notes from admission were reviewed with patient today

## 2023-03-12 ENCOUNTER — Telehealth: Payer: Self-pay

## 2023-03-12 NOTE — Telephone Encounter (Signed)
-----   Message from Sherlene Shams sent at 03/10/2023  3:58 PM EDT ----- diabetes is still under good control on current regimen, but  a1c has risen  compared to last time and LDL has risen as well. . I am recommending that you consider starting a diabetes medication called Jardiance.  London Pepper is an SLGT 2  Inhibitor .  It works to lower blood sugars by routing the excess sugar through the kidneys into the urine. It does make you urinate more, but it has been nproven to have a  protective effect on  your heart .  It actually lowers  your risk for heart attack   if you have already had heart trouble  Starting dose is 10 mg daily  you can sent to his pharmacy if he agrees to take it

## 2023-03-14 NOTE — Telephone Encounter (Signed)
Patient just called. I read him this message. And he said he would like the prescription. The pharmacy he uses is [id number] - [place], [place] - [address], [place] [address] Phone: [phone]  Fax: [phone] DEA #: [id number]

## 2023-03-14 NOTE — Telephone Encounter (Signed)
Medication was sent in on 03/10/2023.

## 2023-03-15 MED ORDER — EMPAGLIFLOZIN 10 MG PO TABS: 10.0000 mg | ORAL_TABLET | Freq: Every day | ORAL | 2 refills | Status: DC

## 2023-03-15 NOTE — Addendum Note (Signed)
Addended by: Sandy Salaam on: 03/15/2023 04:54 PM   Modules accepted: Orders

## 2023-03-15 NOTE — Telephone Encounter (Signed)
Rx did not go electronically. I have resent rx and pt is aware.

## 2023-03-15 NOTE — Telephone Encounter (Signed)
Pt called stating the medication is not at the pharmacy. Pt stated the pharmacy send him a text about his medication being ready

## 2023-03-18 ENCOUNTER — Telehealth: Payer: Self-pay | Admitting: Internal Medicine

## 2023-03-18 NOTE — Telephone Encounter (Signed)
Patient called and said that the CMA needs to call [facility] about his new sugar medication that was just prescribed by Dr Darrick Huntsman. Patient could not remember the new medication's name.

## 2023-03-19 ENCOUNTER — Other Ambulatory Visit: Payer: Self-pay

## 2023-03-19 MED ORDER — EMPAGLIFLOZIN 10 MG PO TABS: 10.0000 mg | ORAL_TABLET | Freq: Every day | ORAL | 2 refills | Status: DC

## 2023-03-19 NOTE — Telephone Encounter (Signed)
See previous message. I spoke with pt this morning to let him know that the medication was being filled at the pharmacy then and I verified it with the pharmacy.

## 2023-03-28 ENCOUNTER — Ambulatory Visit
Admission: RE | Admit: 2023-03-28 | Discharge: 2023-03-28 | Disposition: A | Discharge status: Home / Self Care | Payer: PPO | Source: Ambulatory Visit | Attending: Oncology | Admitting: Oncology

## 2023-03-28 DIAGNOSIS — R911 Solitary pulmonary nodule: Secondary | ICD-10-CM

## 2023-03-28 DIAGNOSIS — J929 Pleural plaque without asbestos: Secondary | ICD-10-CM | POA: Diagnosis not present

## 2023-03-28 DIAGNOSIS — C3490 Malignant neoplasm of unspecified part of unspecified bronchus or lung: Secondary | ICD-10-CM | POA: Diagnosis not present

## 2023-03-28 DIAGNOSIS — Z85118 Personal history of other malignant neoplasm of bronchus and lung: Secondary | ICD-10-CM | POA: Diagnosis not present

## 2023-03-28 DIAGNOSIS — E890 Postprocedural hypothyroidism: Secondary | ICD-10-CM | POA: Diagnosis not present

## 2023-03-28 DIAGNOSIS — J439 Emphysema, unspecified: Secondary | ICD-10-CM | POA: Diagnosis not present

## 2023-03-28 MED ORDER — IOHEXOL 300 MG/ML  SOLN
60.0000 mL | Freq: Once | INTRAMUSCULAR | Status: AC | PRN
  Administered 2023-03-28: 60 mL via INTRAVENOUS

## 2023-04-02 DIAGNOSIS — E890 Postprocedural hypothyroidism: Secondary | ICD-10-CM | POA: Diagnosis not present

## 2023-04-02 DIAGNOSIS — Z85850 Personal history of malignant neoplasm of thyroid: Secondary | ICD-10-CM | POA: Diagnosis not present

## 2023-04-04 ENCOUNTER — Encounter: Payer: Self-pay | Admitting: Oncology

## 2023-04-04 ENCOUNTER — Inpatient Hospital Stay: Payer: PPO | Attending: Oncology | Admitting: Oncology

## 2023-04-04 VITALS — BP 116/71 | HR 87 | Temp 98.3°F | Resp 16 | Ht 69.0 in | Wt 161.0 lb

## 2023-04-04 DIAGNOSIS — Z87891 Personal history of nicotine dependence: Secondary | ICD-10-CM | POA: Diagnosis not present

## 2023-04-04 DIAGNOSIS — Z7901 Long term (current) use of anticoagulants: Secondary | ICD-10-CM | POA: Insufficient documentation

## 2023-04-04 DIAGNOSIS — Z79899 Other long term (current) drug therapy: Secondary | ICD-10-CM | POA: Diagnosis not present

## 2023-04-04 DIAGNOSIS — C3431 Malignant neoplasm of lower lobe, right bronchus or lung: Secondary | ICD-10-CM | POA: Insufficient documentation

## 2023-04-04 DIAGNOSIS — Z85118 Personal history of other malignant neoplasm of bronchus and lung: Secondary | ICD-10-CM

## 2023-04-04 DIAGNOSIS — Z86711 Personal history of pulmonary embolism: Secondary | ICD-10-CM | POA: Insufficient documentation

## 2023-04-04 NOTE — Progress Notes (Signed)
[facility]  Telephone:([phone]) [phone] [phone]  ID: Nathaniel Lane OB: 1946/06/13  MR#: [id number]  UUV#:[id number]  Patient Care Team: Sherlene Shams, MD as PCP - General (Internal Medicine) Glory Buff, RN as Oncology Nurse Navigator  CHIEF COMPLAINT: Left lower lobe lung nodule, pulmonary embolism.  INTERVAL HISTORY: Patient returns to clinic today for hospital follow-up and discussion of his repeat imaging results.  He currently feels well and is asymptomatic.  He is tolerating Eliquis without significant side effects.  He has no neurologic complaints.  He denies any recent fevers or illnesses.  He has a good appetite and denies weight loss.  He has no chest pain, shortness of breath, cough, or hemoptysis.  He denies any nausea, vomiting, constipation, or diarrhea.  He has no urinary complaints.  Patient offers no specific complaints today.  REVIEW OF SYSTEMS:   Review of Systems  Constitutional: Negative.  Negative for fever and malaise/fatigue.  Respiratory: Negative.  Negative for cough, hemoptysis and shortness of breath.   Cardiovascular: Negative.  Negative for chest pain and leg swelling.  Gastrointestinal: Negative.  Negative for abdominal pain.  Genitourinary: Negative.  Negative for dysuria.  Musculoskeletal: Negative.  Negative for back pain.  Skin: Negative.  Negative for rash.  Neurological: Negative.  Negative for dizziness, focal weakness, weakness and headaches.  Psychiatric/Behavioral: Negative.  The patient is not nervous/anxious.     As per HPI. Otherwise, a complete review of systems is negative.  PAST MEDICAL HISTORY: Past Medical History:  Diagnosis Date   Cancer (HCC) 2012   Stage IIA squamous cell Lung Ca   CHF (congestive heart failure) (HCC) 01/28/2017   Diabetes mellitus    Gout    Hyperlipidemia    Hypertension    Myocardial infarction [facility])    Thyroid cancer (HCC) 2013   s/p thyroidectomy 2014    PAST SURGICAL  HISTORY: Past Surgical History:  Procedure Laterality Date   COLONOSCOPY N/A 04/25/2016   Procedure: COLONOSCOPY;  Surgeon: Scot Jun, MD;  Location: [facility] ENDOSCOPY;  Service: Endoscopy;  Laterality: N/A;  Diabetic (oral meds)   COLONOSCOPY WITH PROPOFOL N/A 03/22/2021   Procedure: COLONOSCOPY WITH PROPOFOL;  Surgeon: Toney Reil, MD;  Location: [facility] ENDOSCOPY;  Service: Gastroenterology;  Laterality: N/A;   COLONOSCOPY WITH PROPOFOL N/A 06/29/2022   Procedure: COLONOSCOPY WITH PROPOFOL;  Surgeon: Jaynie Collins, DO;  Location: [facility] ENDOSCOPY;  Service: Gastroenterology;  Laterality: N/A;   CORONARY ARTERY BYPASS GRAFT  2007   LUNG LOBECTOMY     TOTAL THYROIDECTOMY N/A     FAMILY HISTORY: Family History  Problem Relation Age of Onset   Hypertension Other    Heart disease Father     ADVANCED DIRECTIVES (Y/N):  N  HEALTH MAINTENANCE: Social History   Tobacco Use   Smoking status: Former    Current packs/day: 0.00    Types: Cigarettes    Quit date: 07/15/2005    Years since quitting: 17.7    Passive exposure: Past   Smokeless tobacco: Never  Vaping Use   Vaping status: Never Used  Substance Use Topics   Alcohol use: No   Drug use: No     Colonoscopy:  PAP:  Bone density:  Lipid panel:  Allergies  Allergen Reactions   Penicillins Swelling    Swelling in mouth Swelling in mouth Swelling in mouth    Current Outpatient Medications  Medication Sig Dispense Refill   apixaban (ELIQUIS) 5 MG TABS tablet Take 1 tablet (5  mg total) by mouth 2 (two) times daily. 60 tablet 5   atorvastatin (LIPITOR) 40 MG tablet TAKE 1 TABLET BY MOUTH EVERY DAY 90 tablet 3   blood glucose meter kit and supplies KIT Dispense based on patient and insurance preference. Use up to four times daily as directed. (FOR ICD-10 E11.29). 1 each 0   empagliflozin (JARDIANCE) 10 MG TABS tablet Take 1 tablet (10 mg total) by mouth daily before breakfast. 30 tablet 2   gabapentin  (NEURONTIN) 300 MG capsule Take 1 capsule (300 mg total) by mouth 4 (four) times daily. 360 capsule 0   glipiZIDE (GLUCOTROL XL) 5 MG 24 hr tablet TAKE 1 TABLET BY MOUTH EVERY DAY WITH BREAKFAST 90 tablet 3   levothyroxine (SYNTHROID) 137 MCG tablet Take 1 tablet (137 mcg total) by mouth daily before breakfast. 90 tablet 1   losartan (COZAAR) 50 MG tablet TAKE 1 TABLET BY MOUTH EVERY DAY 90 tablet 3   metFORMIN (GLUCOPHAGE-XR) 750 MG 24 hr tablet TAKE 1 TABLET BY MOUTH EVERY DAY WITH BREAKFAST 90 tablet 1   omeprazole (PRILOSEC) 20 MG capsule Take 20 mg by mouth daily.     ONETOUCH DELICA LANCETS FINE MISC USE AS DIRECTED 4 TIMES A DAY 100 each 2   ONETOUCH ULTRA test strip USE AS DIRECTED 4 TIMES A DAY 100 strip 2   tamsulosin (FLOMAX) 0.4 MG CAPS capsule TAKE 1 CAPSULE BY MOUTH EVERY DAY 90 capsule 1   No current facility-administered medications for this visit.    OBJECTIVE: Vitals:   04/04/23 1055  BP: 116/71  Pulse: 87  Resp: 16  Temp: 98.3 F (36.8 C)  SpO2: 98%     Body mass index is 23.78 kg/m.    ECOG FS:0 - Asymptomatic  General: Well-developed, well-nourished, no acute distress. Eyes: Pink conjunctiva, anicteric sclera. HEENT: Normocephalic, moist mucous membranes. Lungs: No audible wheezing or coughing. Heart: Regular rate and rhythm. Abdomen: Soft, nontender, no obvious distention. Musculoskeletal: No edema, cyanosis, or clubbing. Neuro: Alert, answering all questions appropriately. Cranial nerves grossly intact. Skin: No rashes or petechiae noted. Psych: Normal affect. Lymphatics: No cervical, calvicular, axillary or inguinal LAD.   LAB RESULTS:  Lab Results  Component Value Date   NA 137 03/08/2023   K 4.7 03/08/2023   CL 101 03/08/2023   CO2 30 03/08/2023   GLUCOSE 83 03/08/2023   BUN 35 (H) 03/08/2023   CREATININE 1.72 (H) 03/08/2023   CALCIUM 9.6 03/08/2023   PROT 7.8 03/08/2023   ALBUMIN 3.5 03/08/2023   AST 13 03/08/2023   ALT 19 03/08/2023    ALKPHOS 103 03/08/2023   BILITOT 0.4 03/08/2023   GFRNONAA 53 (L) 02/14/2023   GFRAA 37 (L) 02/24/2020    Lab Results  Component Value Date   WBC 12.2 (H) 03/08/2023   NEUTROABS 8.5 (H) 03/08/2023   HGB 13.8 03/08/2023   HCT 42.5 03/08/2023   MCV 88.2 03/08/2023   PLT 200.0 03/08/2023     STUDIES: CT Chest W Contrast  Result Date: 03/28/2023 CLINICAL DATA:  Monitoring non-small-cell lung cancer. * Tracking Code: BO * EXAM: CT CHEST WITH CONTRAST TECHNIQUE: Multidetector CT imaging of the chest was performed during intravenous contrast administration. RADIATION DOSE REDUCTION: This exam was performed according to the departmental dose-optimization program which includes automated exposure control, adjustment of the mA and/or kV according to patient size and/or use of iterative reconstruction technique. CONTRAST:  60mL OMNIPAQUE IOHEXOL 300 MG/ML  SOLN COMPARISON:  CT angiogram chest 02/12/2023.  Older chest CT exam. FINDINGS: Cardiovascular: Status post median sternotomy. Right upper chest port with the tip seen as far as the upper SVC. The right brachiocephalic vein is somewhat small as is the internal jugular vein. Please correlate with history. Coronary artery calcifications are seen. The heart is nonenlarged. No pericardial effusion. The thoracic aorta has some partially calcified atherosclerotic plaque diffusely. As on the prior once again there is some nonocclusive thrombus in the right interlobar pulmonary artery. Extent and distribution is similar to previous. Again this has previously described. Mediastinum/Nodes: Normal caliber thoracic esophagus. Small thyroid gland. No specific abnormal lymph node enlargement identified in the axillary regions, right hilum. There are some small nodes in the right hilum and subcarinal mediastinum which are unchanged from previous and not pathologic by size criteria. Lungs/Pleura: Centrilobular emphysematous changes. Persistent left-sided pleural  thickening with some calcifications. Juxtapleural on the left posteriorly in the superior segment of the lower lobe once again is a spiculated nodular area which previously measured 18 x 15 mm and today 21 by 17 mm. Please correlate with any prior workup for neoplasm. There are surgical changes of previous right lower lobectomy. Adjacent parenchymal opacity which is confluence is similar but the adjacent ill-defined opacities and interstitial thickening is improving. No new right-sided lung mass. Upper Abdomen: Left adrenal nodule is again seen. Previously measuring up to 16 mm and today 16 mm on series 2, image 162. Stones in the nondilated gallbladder. Musculoskeletal: Degenerative changes along the spine. Bridging osteophytes and syndesmophytes. Multilevel Schmorl's node deformities. IMPRESSION: Previous right lower lobectomy. The confluence opacity at the right lung base is stable. The surrounding interstitial thickening and ground-glass is improving in the interval. Persistent left-sided pleural thickening and calcification however the associated juxtapleural spiculated nodule in the left lower lobe superior segment is slightly larger today. Please correlate for any history of neoplasm or further workup of this lesion. Stable small lymph nodes in the mediastinum and right hilum. Stable appearance of the previously described nonocclusive thrombus in the right interlobar pulmonary artery. Stable left adrenal nodule. Gallstones. Aortic Atherosclerosis (ICD10-I70.0) and Emphysema (ICD10-J43.9). Electronically Signed   By: Karen Kays M.D.   On: 03/28/2023 10:15    ASSESSMENT: Left lower lobe lung nodule, pulmonary embolism.  PLAN:    Pulmonary embolism: Possibly related to recent long car trips.  Patient reported that in the several weeks prior to his pulmonary embolism, he had been on 3 separate car trips all between 12 and 14 hours.  Continue Eliquis as prescribed.  Patient will require anticoagulation for  6 months through at least March 2025.  Left lower lobe pulmonary nodule, mild lymphadenopathy: Repeat CT scan on March 28, 2023 reviewed independently and report as above with persistent nodule.  We discussed the possibility of a PET scan, but agreed upon repeating CT scan in 3 months to assess for interval change.  Patient will follow-up approximately 1 week after imaging to discuss the results.   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 He completed adjuvant chemotherapy in July 2013.  Renal insufficiency: Patient's most recent creatinine is 1.72.  This appears to be approximately his baseline.  Patient expressed understanding and was in agreement with this plan. He also understands that He can call clinic at any time with any questions, concerns, or complaints.    Cancer Staging  No matching staging information was found for the patient.   Jeralyn Ruths, MD  04/04/2023 12:08 PM

## 2023-04-09 ENCOUNTER — Encounter: Payer: Self-pay | Admitting: Nurse Practitioner

## 2023-04-09 ENCOUNTER — Other Ambulatory Visit: Payer: Self-pay

## 2023-04-09 ENCOUNTER — Emergency Department: Payer: PPO

## 2023-04-09 ENCOUNTER — Inpatient Hospital Stay
Admission: EM | Admit: 2023-04-09 | Discharge: 2023-04-12 | DRG: 175 | Disposition: A | Discharge status: Home / Self Care | Payer: PPO | Attending: Internal Medicine | Admitting: Internal Medicine

## 2023-04-09 ENCOUNTER — Ambulatory Visit: Payer: PPO | Admitting: Nurse Practitioner

## 2023-04-09 VITALS — BP 110/60 | HR 65 | Temp 97.7°F | Ht 69.0 in | Wt 164.2 lb

## 2023-04-09 DIAGNOSIS — I2782 Chronic pulmonary embolism: Principal | ICD-10-CM | POA: Diagnosis present

## 2023-04-09 DIAGNOSIS — C3490 Malignant neoplasm of unspecified part of unspecified bronchus or lung: Secondary | ICD-10-CM | POA: Diagnosis not present

## 2023-04-09 DIAGNOSIS — E1169 Type 2 diabetes mellitus with other specified complication: Secondary | ICD-10-CM | POA: Diagnosis not present

## 2023-04-09 DIAGNOSIS — E1122 Type 2 diabetes mellitus with diabetic chronic kidney disease: Secondary | ICD-10-CM | POA: Diagnosis not present

## 2023-04-09 DIAGNOSIS — I25810 Atherosclerosis of coronary artery bypass graft(s) without angina pectoris: Secondary | ICD-10-CM | POA: Diagnosis present

## 2023-04-09 DIAGNOSIS — I10 Essential (primary) hypertension: Secondary | ICD-10-CM | POA: Diagnosis not present

## 2023-04-09 DIAGNOSIS — D72829 Elevated white blood cell count, unspecified: Secondary | ICD-10-CM | POA: Diagnosis present

## 2023-04-09 DIAGNOSIS — F411 Generalized anxiety disorder: Secondary | ICD-10-CM | POA: Diagnosis present

## 2023-04-09 DIAGNOSIS — I5022 Chronic systolic (congestive) heart failure: Secondary | ICD-10-CM | POA: Diagnosis not present

## 2023-04-09 DIAGNOSIS — Z7984 Long term (current) use of oral hypoglycemic drugs: Secondary | ICD-10-CM

## 2023-04-09 DIAGNOSIS — I2699 Other pulmonary embolism without acute cor pulmonale: Secondary | ICD-10-CM | POA: Diagnosis not present

## 2023-04-09 DIAGNOSIS — Z951 Presence of aortocoronary bypass graft: Secondary | ICD-10-CM

## 2023-04-09 DIAGNOSIS — Z87891 Personal history of nicotine dependence: Secondary | ICD-10-CM

## 2023-04-09 DIAGNOSIS — Z85118 Personal history of other malignant neoplasm of bronchus and lung: Secondary | ICD-10-CM

## 2023-04-09 DIAGNOSIS — R54 Age-related physical debility: Secondary | ICD-10-CM | POA: Diagnosis present

## 2023-04-09 DIAGNOSIS — Z66 Do not resuscitate: Secondary | ICD-10-CM | POA: Diagnosis present

## 2023-04-09 DIAGNOSIS — J449 Chronic obstructive pulmonary disease, unspecified: Secondary | ICD-10-CM | POA: Diagnosis not present

## 2023-04-09 DIAGNOSIS — I214 Non-ST elevation (NSTEMI) myocardial infarction: Secondary | ICD-10-CM | POA: Diagnosis not present

## 2023-04-09 DIAGNOSIS — I21A1 Myocardial infarction type 2: Secondary | ICD-10-CM | POA: Diagnosis not present

## 2023-04-09 DIAGNOSIS — N1831 Chronic kidney disease, stage 3a: Secondary | ICD-10-CM | POA: Diagnosis not present

## 2023-04-09 DIAGNOSIS — Z7901 Long term (current) use of anticoagulants: Secondary | ICD-10-CM

## 2023-04-09 DIAGNOSIS — Z79890 Hormone replacement therapy (postmenopausal): Secondary | ICD-10-CM | POA: Diagnosis not present

## 2023-04-09 DIAGNOSIS — I255 Ischemic cardiomyopathy: Secondary | ICD-10-CM | POA: Diagnosis present

## 2023-04-09 DIAGNOSIS — E785 Hyperlipidemia, unspecified: Secondary | ICD-10-CM | POA: Diagnosis not present

## 2023-04-09 DIAGNOSIS — Z86711 Personal history of pulmonary embolism: Secondary | ICD-10-CM

## 2023-04-09 DIAGNOSIS — R0602 Shortness of breath: Secondary | ICD-10-CM | POA: Diagnosis not present

## 2023-04-09 DIAGNOSIS — Z8249 Family history of ischemic heart disease and other diseases of the circulatory system: Secondary | ICD-10-CM | POA: Diagnosis not present

## 2023-04-09 DIAGNOSIS — I130 Hypertensive heart and chronic kidney disease with heart failure and stage 1 through stage 4 chronic kidney disease, or unspecified chronic kidney disease: Secondary | ICD-10-CM | POA: Diagnosis present

## 2023-04-09 DIAGNOSIS — R918 Other nonspecific abnormal finding of lung field: Secondary | ICD-10-CM | POA: Diagnosis not present

## 2023-04-09 DIAGNOSIS — R079 Chest pain, unspecified: Secondary | ICD-10-CM

## 2023-04-09 DIAGNOSIS — E1151 Type 2 diabetes mellitus with diabetic peripheral angiopathy without gangrene: Secondary | ICD-10-CM | POA: Diagnosis present

## 2023-04-09 DIAGNOSIS — Z7982 Long term (current) use of aspirin: Secondary | ICD-10-CM

## 2023-04-09 DIAGNOSIS — E1140 Type 2 diabetes mellitus with diabetic neuropathy, unspecified: Secondary | ICD-10-CM | POA: Diagnosis not present

## 2023-04-09 DIAGNOSIS — I700 Atherosclerosis of aorta: Secondary | ICD-10-CM | POA: Diagnosis present

## 2023-04-09 DIAGNOSIS — I2510 Atherosclerotic heart disease of native coronary artery without angina pectoris: Secondary | ICD-10-CM | POA: Diagnosis present

## 2023-04-09 DIAGNOSIS — R778 Other specified abnormalities of plasma proteins: Secondary | ICD-10-CM | POA: Diagnosis not present

## 2023-04-09 DIAGNOSIS — J439 Emphysema, unspecified: Secondary | ICD-10-CM | POA: Diagnosis not present

## 2023-04-09 DIAGNOSIS — R0789 Other chest pain: Secondary | ICD-10-CM | POA: Diagnosis not present

## 2023-04-09 DIAGNOSIS — E890 Postprocedural hypothyroidism: Secondary | ICD-10-CM | POA: Diagnosis present

## 2023-04-09 DIAGNOSIS — K8020 Calculus of gallbladder without cholecystitis without obstruction: Secondary | ICD-10-CM | POA: Diagnosis not present

## 2023-04-09 DIAGNOSIS — Z79899 Other long term (current) drug therapy: Secondary | ICD-10-CM

## 2023-04-09 DIAGNOSIS — R911 Solitary pulmonary nodule: Secondary | ICD-10-CM | POA: Diagnosis not present

## 2023-04-09 DIAGNOSIS — I2119 ST elevation (STEMI) myocardial infarction involving other coronary artery of inferior wall: Secondary | ICD-10-CM | POA: Diagnosis not present

## 2023-04-09 DIAGNOSIS — N183 Chronic kidney disease, stage 3 unspecified: Secondary | ICD-10-CM | POA: Diagnosis not present

## 2023-04-09 DIAGNOSIS — R7989 Other specified abnormal findings of blood chemistry: Secondary | ICD-10-CM

## 2023-04-09 DIAGNOSIS — R109 Unspecified abdominal pain: Secondary | ICD-10-CM | POA: Diagnosis present

## 2023-04-09 DIAGNOSIS — Z880 Allergy status to penicillin: Secondary | ICD-10-CM

## 2023-04-09 DIAGNOSIS — Z85850 Personal history of malignant neoplasm of thyroid: Secondary | ICD-10-CM

## 2023-04-09 DIAGNOSIS — Z902 Acquired absence of lung [part of]: Secondary | ICD-10-CM

## 2023-04-09 DIAGNOSIS — E1129 Type 2 diabetes mellitus with other diabetic kidney complication: Secondary | ICD-10-CM | POA: Diagnosis present

## 2023-04-09 DIAGNOSIS — I252 Old myocardial infarction: Secondary | ICD-10-CM

## 2023-04-09 HISTORY — DX: Shortness of breath: R06.02

## 2023-04-09 HISTORY — DX: Unspecified abdominal pain: R10.9

## 2023-04-09 LAB — TROPONIN I (HIGH SENSITIVITY)
Troponin I (High Sensitivity): 87 ng/L — ABNORMAL HIGH
Troponin I (High Sensitivity): 176 ng/L
Troponin I (High Sensitivity): 286 ng/L (ref <18)
Troponin I (High Sensitivity): 291 ng/L (ref <18)

## 2023-04-09 LAB — PROTIME-INR
INR: 1.8 — ABNORMAL HIGH (ref 0.8–1.2)
Prothrombin Time: 21.3 s — ABNORMAL HIGH (ref 11.4–15.2)

## 2023-04-09 LAB — BASIC METABOLIC PANEL WITH GFR
Anion gap: 9 (ref 5–15)
BUN: 31 mg/dL — ABNORMAL HIGH (ref 8–23)
CO2: 23 mmol/L (ref 22–32)
Calcium: 8.7 mg/dL — ABNORMAL LOW (ref 8.9–10.3)
Chloride: 100 mmol/L (ref 98–111)
Creatinine, Ser: 1.97 mg/dL — ABNORMAL HIGH (ref 0.61–1.24)
GFR, Estimated: 35 mL/min — ABNORMAL LOW
Glucose, Bld: 177 mg/dL — ABNORMAL HIGH (ref 70–99)
Potassium: 4.6 mmol/L (ref 3.5–5.1)
Sodium: 132 mmol/L — ABNORMAL LOW (ref 135–145)

## 2023-04-09 LAB — CBC
HCT: 37.6 % — ABNORMAL LOW (ref 39.0–52.0)
Hemoglobin: 11.8 g/dL — ABNORMAL LOW (ref 13.0–17.0)
MCH: 27.3 pg (ref 26.0–34.0)
MCHC: 31.4 g/dL (ref 30.0–36.0)
MCV: 86.8 fL (ref 80.0–100.0)
Platelets: 315 10*3/uL (ref 150–400)
RBC: 4.33 MIL/uL (ref 4.22–5.81)
RDW: 14.5 % (ref 11.5–15.5)
WBC: 11 10*3/uL — ABNORMAL HIGH (ref 4.0–10.5)
nRBC: 0 % (ref 0.0–0.2)

## 2023-04-09 LAB — CBG MONITORING, ED: Glucose-Capillary: 137 mg/dL — ABNORMAL HIGH (ref 70–99)

## 2023-04-09 LAB — HEPATIC FUNCTION PANEL
ALT: 46 U/L — ABNORMAL HIGH (ref 0–44)
AST: 41 U/L (ref 15–41)
Albumin: 2.4 g/dL — ABNORMAL LOW (ref 3.5–5.0)
Alkaline Phosphatase: 82 U/L (ref 38–126)
Bilirubin, Direct: <0.1 mg/dL (ref 0.0–0.2)
Total Bilirubin: 0.4 mg/dL (ref 0.3–1.2)
Total Protein: 6.7 g/dL (ref 6.5–8.1)

## 2023-04-09 LAB — GLUCOSE, CAPILLARY: Glucose-Capillary: 132 mg/dL — ABNORMAL HIGH (ref 70–99)

## 2023-04-09 LAB — BRAIN NATRIURETIC PEPTIDE: B Natriuretic Peptide: 322.1 pg/mL — ABNORMAL HIGH (ref 0.0–100.0)

## 2023-04-09 LAB — APTT: aPTT: 51 s — ABNORMAL HIGH (ref 24–36)

## 2023-04-09 MED ORDER — ONDANSETRON HCL 4 MG/2ML IJ SOLN: 4.0000 mg | Freq: Four times a day (QID) | INTRAMUSCULAR | Status: DC | PRN

## 2023-04-09 MED ORDER — HEPARIN BOLUS VIA INFUSION
4000.0000 [IU] | Freq: Once | INTRAVENOUS | Status: AC
  Administered 2023-04-09: 4000 [IU] via INTRAVENOUS
  Filled 2023-04-09: qty 4000

## 2023-04-09 MED ORDER — ONDANSETRON HCL 4 MG PO TABS: 4.0000 mg | ORAL_TABLET | Freq: Four times a day (QID) | ORAL | Status: DC | PRN

## 2023-04-09 MED ORDER — ACETAMINOPHEN 650 MG RE SUPP: 650.0000 mg | Freq: Four times a day (QID) | RECTAL | Status: DC | PRN

## 2023-04-09 MED ORDER — LEVOTHYROXINE SODIUM 137 MCG PO TABS
137.0000 ug | ORAL_TABLET | Freq: Every day | ORAL | Status: DC
  Administered 2023-04-10 – 2023-04-12 (×3): 137 ug via ORAL
  Filled 2023-04-09 (×3): qty 1

## 2023-04-09 MED ORDER — HEPARIN (PORCINE) 25000 UT/250ML-% IV SOLN
1500.0000 [IU]/h | INTRAVENOUS | Status: DC
  Administered 2023-04-09: 850 [IU]/h via INTRAVENOUS
  Filled 2023-04-09 (×3): qty 250

## 2023-04-09 MED ORDER — GABAPENTIN 300 MG PO CAPS
300.0000 mg | ORAL_CAPSULE | Freq: Four times a day (QID) | ORAL | Status: DC
  Administered 2023-04-09 – 2023-04-12 (×10): 300 mg via ORAL
  Filled 2023-04-09 (×10): qty 1

## 2023-04-09 MED ORDER — TAMSULOSIN HCL 0.4 MG PO CAPS
0.4000 mg | ORAL_CAPSULE | Freq: Every day | ORAL | Status: DC
  Administered 2023-04-10 – 2023-04-12 (×3): 0.4 mg via ORAL
  Filled 2023-04-09 (×3): qty 1

## 2023-04-09 MED ORDER — ATORVASTATIN CALCIUM 20 MG PO TABS
40.0000 mg | ORAL_TABLET | Freq: Every day | ORAL | Status: DC
  Administered 2023-04-10: 40 mg via ORAL
  Filled 2023-04-09: qty 2

## 2023-04-09 MED ORDER — IOHEXOL 350 MG/ML SOLN
80.0000 mL | Freq: Once | INTRAVENOUS | Status: AC | PRN
  Administered 2023-04-09: 80 mL via INTRAVENOUS

## 2023-04-09 MED ORDER — PANTOPRAZOLE SODIUM 40 MG PO TBEC
40.0000 mg | DELAYED_RELEASE_TABLET | Freq: Every day | ORAL | Status: DC
  Administered 2023-04-10 – 2023-04-12 (×3): 40 mg via ORAL
  Filled 2023-04-09 (×3): qty 1

## 2023-04-09 MED ORDER — ACETAMINOPHEN 325 MG PO TABS: 650.0000 mg | ORAL_TABLET | Freq: Four times a day (QID) | ORAL | Status: DC | PRN

## 2023-04-09 MED ORDER — INSULIN ASPART 100 UNIT/ML IJ SOLN: 0.0000 [IU] | Freq: Every day | INTRAMUSCULAR | Status: DC

## 2023-04-09 MED ORDER — INSULIN ASPART 100 UNIT/ML IJ SOLN
0.0000 [IU] | Freq: Three times a day (TID) | INTRAMUSCULAR | Status: DC
  Administered 2023-04-09 – 2023-04-10 (×3): 1 [IU] via SUBCUTANEOUS
  Administered 2023-04-11: 2 [IU] via SUBCUTANEOUS
  Administered 2023-04-11 – 2023-04-12 (×3): 1 [IU] via SUBCUTANEOUS
  Filled 2023-04-09 (×7): qty 1

## 2023-04-09 MED ORDER — SENNOSIDES-DOCUSATE SODIUM 8.6-50 MG PO TABS: 1.0000 | ORAL_TABLET | Freq: Every evening | ORAL | Status: DC | PRN

## 2023-04-09 NOTE — Assessment & Plan Note (Addendum)
Vitals stable. No respiratory distress. EKG without acute findings, compared to priors in chart. O2 stable at 97% on room air and 95% with ambulation. After consulting with Dr. Birdie Sons, based on his increased shortness of breath with ambulation and history, we recommended that he be evaluated in the ED. Likely needs additional imaging, CT Angio and lab work prior given his kidney function. Patient agreeable to going to ED.

## 2023-04-09 NOTE — Consult Note (Signed)
Pharmacy Consult Note - Anticoagulation  Pharmacy Consult for heparin Indication: chest pain/ACS  PATIENT MEASUREMENTS: Height: 5\' 9"  (175.3 cm) Weight: 74 kg (163 lb 2.3 oz) IBW/kg (Calculated) : 70.7 HEPARIN DW (KG): 74  VITAL SIGNS: Temp: 97.9 F (36.6 C) (10/15 0946) BP: 126/77 (10/15 1215) Pulse Rate: 78 (10/15 1215)  Recent Labs    04/09/23 0954 04/09/23 1216  HGB 11.8*  --   HCT 37.6*  --   PLT 315  --   APTT 51*  --   LABPROT 21.3*  --   INR 1.8*  --   CREATININE 1.97*  --   TROPONINIHS 87* 176*    Estimated Creatinine Clearance: 32.4 mL/min (A) (by C-G formula based on SCr of 1.97 mg/dL (H)).  PAST MEDICAL HISTORY: Past Medical History:  Diagnosis Date   Cancer (HCC) 2012   Stage IIA squamous cell Lung Ca   CHF (congestive heart failure) (HCC) 01/28/2017   Diabetes mellitus    Gout    Hyperlipidemia    Hypertension    Myocardial infarction [facility])    Thyroid cancer (HCC) 2013   s/p thyroidectomy 2014    ASSESSMENT:1 77 y.o. male with PMH pulmonary embolism (on apixaban since 01/2023), lung cancer s/p RLL lobectomy, HFpEF (01/2023 EF 60-65%) is presenting with ACS. He endorses shortness of breath since Friday that worsens after minimal exertion. Patient has been adherent to apixaban and imaging is negative for PE but is concerning for cancer recurrence. Cardiac findings include ECG showing sinus tachycardia with borderline ST depression, and cTn currently trending up, from 87 to 176. BNP is 322. Pharmacy has been consulted to initiate and manage heparin intravenous infusion.  Pertinent medications: Apixaban 5 mg twice daily for PE  Goal(s) of therapy: Heparin level 0.3 - 0.7 units/mL Monitor platelets by anticoagulation protocol: Yes   Baseline anticoagulation labs: Recent Labs    04/09/23 0954  APTT 51*  INR 1.8*  HGB 11.8*  PLT 315    Date Time aPTT/HL Rate/Comment   PLAN: Give 4000 units bolus x1; then start heparin infusion at 850  units/hour. Check aPTT in 8 hours. Continue to titrate by aPTT until heparin level and aPTT correlate and/or apixaban washes out, then titrate by heparin level alone. Check heparin level with next AM labs. Continue to monitor CBC daily while on heparin infusion.   Will M. Dareen Piano, PharmD Clinical Pharmacist 04/09/2023 3:45 PM

## 2023-04-09 NOTE — Assessment & Plan Note (Signed)
Synthroid

## 2023-04-09 NOTE — Assessment & Plan Note (Signed)
Atorvastatin resumed

## 2023-04-09 NOTE — Hospital Course (Signed)
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  Vitals in the ED showed temperature of 97.7, respiration rate of 22, heart rate 65, blood pressure initially 110/60, SpO2 97% on room air.  Serum sodium is 132, potassium 4.6, chloride 100, bicarb 23, BUN of 31, serum creatinine 1.97, nonfasting blood glucose 177, EGFR 35, WBC 11, hemoglobin 11.8, platelets of 315.  BNP was 222.1.  High sensitive troponin was 8 and on repeat was 179.  CTA of the chest for PE: Stable rounded filling defect seen in lower lobe branch of right pulmonary artery compared to prior exam, concerning for chronic or residual PE.  No new PE is noted.  Status post right lower lobectomy and probable scarring in right lung base.  Aortic atherosclerosis.  ED treatment: None

## 2023-04-09 NOTE — ED Notes (Signed)
Assumed care 1900. Pt had Troponin due at 1600 and 1800, not yet collected. RN modified order and collected Troponin 1900. Re-timed for 2 more collections 2 hours apart, per initial order.

## 2023-04-09 NOTE — ED Triage Notes (Signed)
Pt to ED for shob for the past couple days. Sent from PCP. Reports had PE in the last 8 weeks. C/o pain to right rib cage

## 2023-04-09 NOTE — Assessment & Plan Note (Addendum)
Reported on admission, appears resolved. 10/16 - pt does not mention any abdominal pain.  LFT's normal except mildly elevated ALT of 46.  Normal Tbili.   --Monitor clinically for now

## 2023-04-09 NOTE — Progress Notes (Signed)
Bethanie Dicker, NP-C Phone: [phone]  Nathaniel Lane is a 77 y.o. male who presents today for shortness of breath.   Patient with worsening shortness of breath since Friday. He reports difficulty breathing when ambulating. He feels that he cannot catch his breath. He is only able to take a few steps before he needs to rest to catch his breath. He does currently have a PE which he is being treated for with Eliquis. He also has a Hx of lung cancer. He had a recent CT Chest on 03/28/2023 and follow up appointment with Oncology on 04/04/2023. Notes and imaging reviewed. Denies fever/chills. Denies cough.   Social History   Tobacco Use  Smoking Status Former   Current packs/day: 0.00   Types: Cigarettes   Quit date: 07/15/2005   Years since quitting: 17.7   Passive exposure: Past  Smokeless Tobacco Never    Current Outpatient Medications on File Prior to Visit  Medication Sig Dispense Refill   apixaban (ELIQUIS) 5 MG TABS tablet Take 1 tablet (5 mg total) by mouth 2 (two) times daily. 60 tablet 5   atorvastatin (LIPITOR) 40 MG tablet TAKE 1 TABLET BY MOUTH EVERY DAY 90 tablet 3   blood glucose meter kit and supplies KIT Dispense based on patient and insurance preference. Use up to four times daily as directed. (FOR ICD-10 E11.29). 1 each 0   empagliflozin (JARDIANCE) 10 MG TABS tablet Take 1 tablet (10 mg total) by mouth daily before breakfast. 30 tablet 2   gabapentin (NEURONTIN) 300 MG capsule Take 1 capsule (300 mg total) by mouth 4 (four) times daily. 360 capsule 0   glipiZIDE (GLUCOTROL XL) 5 MG 24 hr tablet TAKE 1 TABLET BY MOUTH EVERY DAY WITH BREAKFAST 90 tablet 3   levothyroxine (SYNTHROID) 137 MCG tablet Take 1 tablet (137 mcg total) by mouth daily before breakfast. 90 tablet 1   losartan (COZAAR) 50 MG tablet TAKE 1 TABLET BY MOUTH EVERY DAY 90 tablet 3   metFORMIN (GLUCOPHAGE-XR) 750 MG 24 hr tablet TAKE 1 TABLET BY MOUTH EVERY DAY WITH BREAKFAST 90 tablet 1   omeprazole  (PRILOSEC) 20 MG capsule Take 20 mg by mouth daily.     ONETOUCH DELICA LANCETS FINE MISC USE AS DIRECTED 4 TIMES A DAY 100 each 2   ONETOUCH ULTRA test strip USE AS DIRECTED 4 TIMES A DAY 100 strip 2   tamsulosin (FLOMAX) 0.4 MG CAPS capsule TAKE 1 CAPSULE BY MOUTH EVERY DAY 90 capsule 1   No current facility-administered medications on file prior to visit.    ROS see history of present illness  Objective  Physical Exam Vitals:   04/09/23 0844  BP: 110/60  Pulse: 65  Temp: 97.7 F (36.5 C)  SpO2: 97%    BP Readings from Last 3 Encounters:  04/09/23 110/60  04/04/23 116/71  03/08/23 116/70   Wt Readings from Last 3 Encounters:  04/09/23 164 lb 3.2 oz (74.5 kg)  04/04/23 161 lb (73 kg)  03/08/23 167 lb (75.8 kg)    Physical Exam Constitutional:      General: He is not in acute distress.    Appearance: Normal appearance.  HENT:     Head: Normocephalic.  Cardiovascular:     Rate and Rhythm: Normal rate and regular rhythm.     Heart sounds: Normal heart sounds.  Pulmonary:     Effort: No respiratory distress.     Breath sounds: Normal breath sounds. No wheezing.  Abdominal:  General: Abdomen is flat. Bowel sounds are normal.     Palpations: Abdomen is soft.  Skin:    General: Skin is warm and dry.  Neurological:     General: No focal deficit present.     Mental Status: He is alert.  Psychiatric:        Mood and Affect: Mood normal.        Behavior: Behavior normal.    Assessment/Plan: Please see individual problem list.  Shortness of breath Assessment & Plan: Vitals stable. No respiratory distress. EKG without acute findings, compared to priors in chart. O2 stable at 97% on room air and 95% with ambulation. After consulting with Dr. Birdie Sons, based on his increased shortness of breath with ambulation and history, we recommended that he be evaluated in the ED. Likely needs additional imaging, CT Angio and lab work prior given his kidney function. Patient  agreeable to going to ED.   Orders: -     EKG 12-Lead   No follow-ups on file.   Bethanie Dicker, NP-C [facility] [employer]

## 2023-04-09 NOTE — Assessment & Plan Note (Addendum)
Troponin peaked at 291. Suspect demand ischemia in setting of dyspnea from residual PE.  Pt has had no chest pain. --Cardiology consulted - follow up recs --Treated w IV heparin for 48 hours --No further cardiac evaluation planned or indicated at this time --Resumed Eliquis

## 2023-04-09 NOTE — ED Provider Notes (Addendum)
[facility] Provider Note    Event Date/Time   First MD Initiated Contact with Patient 04/09/23 [phone]     (approximate)  History   Chief Complaint: Shortness of Breath  HPI  DAYLON LAFAVOR is a 77 y.o. male with a past medical history of cancer status post resection, CHF, hypertension, hyperlipidemia, diabetes, presents to the emergency department for shortness of breath.  According to the patient for the past 4 to 5 days patient has been feeling short of breath with minimal exertion.  Patient states approximate 10 weeks ago he was diagnosed with a pulmonary embolism put on Eliquis which he has been taking.  States on occasion he will have some pain to the right chest and right flank but it is not frequent and not pleuritic.  Given the shortness of breath patient went to see his doctor today who referred him to the emergency department for a more expedited workup.  Physical Exam   Triage Vital Signs: ED Triage Vitals  Encounter Vitals Group     BP 04/09/23 0946 123/73     Systolic BP Percentile --      Diastolic BP Percentile --      Pulse Rate 04/09/23 0946 82     Resp 04/09/23 0946 (!) 22     Temp 04/09/23 0946 97.9 F (36.6 C)     Temp src --      SpO2 04/09/23 0946 94 %     Weight 04/09/23 0947 163 lb 2.3 oz (74 kg)     Height 04/09/23 0947 5\' 9"  (1.753 m)     Head Circumference --      Peak Flow --      Pain Score 04/09/23 0947 5     Pain Loc --      Pain Education --      Exclude from Growth Chart --     Most recent vital signs: Vitals:   04/09/23 0946  BP: 123/73  Pulse: 82  Resp: (!) 22  Temp: 97.9 F (36.6 C)  SpO2: 94%    General: Awake, no distress.  CV:  Good peripheral perfusion.  Regular rate and rhythm  Resp:  Normal effort.  Equal breath sounds bilaterally.  Abd:  No distention.  Soft, nontender.  No rebound or guarding.  ED Results / Procedures / Treatments   EKG  EKG viewed and interpreted by myself shows a sinus  rhythm at 93 bpm with a narrow QRS, normal axis, normal intervals, nonspecific ST changes.  RADIOLOGY  Reviewed and interpreted CTA images.  No obvious consolidation on my evaluation or obvious significant emboli. Radiology is read the CT scan as stable filling defects no acute pulmonary emboli.  Patient does have a 21 x 15 mm pleural-based nodule concerning for possible malignancy.   MEDICATIONS ORDERED IN ED: Medications - No data to display   IMPRESSION / MDM / ASSESSMENT AND PLAN / ED COURSE  I reviewed the triage vital signs and the nursing notes.  Patient's presentation is most consistent with acute presentation with potential threat to life or bodily function.  Patient presents to the emergency department for shortness of breath.  Patient states PE diagnosed 10 weeks ago currently on Eliquis has not missed any doses.  Shortness of breath has been worsening over the past for 5 days.  We will check labs including cardiac enzymes and a BNP.  Given the patient's recent PE with worsening shortness of breath we will obtain a CTA  of the chest to rule out new PE.  Patient agreeable to plan of care and workup.  Vital signs reassuring.  Patient is in no distress.  Patient's workup shows a reassuring CBC no significant findings on the chemistry troponin mildly elevated 87 although largely unchanged from historical values.  CTA shows no new PE patient does have 21 x 15 pleural-based nodule that is concerning the left lung base.  This has been conveyed to the patient he is aware of this and his oncologist Dr. Orlie Dakin is following.  Given the patient's otherwise reassuring workup we will discharge home with outpatient follow-up with oncology.  Shortly after discharge patient's repeat troponin resulted at 176.  Although his initial troponin is unchanged from historical values as a second troponin is elevated 176 I have called the patient and discussed with him over the phone the results and he will  return to the emergency department for admission given his dyspnea on exertion and intermittent chest pain and elevated troponin for ongoing management and trending of his troponins.  Patient is return to the emergency department and will admit to the hospital service for further workup and treatment.  Patient agreeable to plan of care.  FINAL CLINICAL IMPRESSION(S) / ED DIAGNOSES   Dyspnea on exertion   Note:  This document was prepared using Dragon voice recognition software and may include unintentional dictation errors.   Minna Antis, MD 04/09/23 1231    Minna Antis, MD 04/09/23 1400    Minna Antis, MD 04/09/23 1439

## 2023-04-09 NOTE — H&P (Signed)
History and Physical   Nathaniel Lane ION:[id number] DOB: 1946/03/12 DOA: 04/09/2023  PCP: Sherlene Shams, MD  Specialist: Dr. Juliann Pares Patient coming from: PCP  I have personally briefly reviewed patient's old medical records in [facility] EMR.  Chief Concern: shortness of breath  HPI: 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  Vitals in the ED showed temperature of 97.7, respiration rate of 22, heart rate 65, blood pressure initially 110/60, SpO2 97% on room air.  Serum sodium is 132, potassium 4.6, chloride 100, bicarb 23, BUN of 31, serum creatinine 1.97, nonfasting blood glucose 177, EGFR 35, WBC 11, hemoglobin 11.8, platelets of 315.  BNP was 222.1.  High sensitive troponin was 8 and on repeat was 179.  CTA of the chest for PE: Stable rounded filling defect seen in lower lobe branch of right pulmonary artery compared to prior exam, concerning for chronic or residual PE.  No new PE is noted.  Status post right lower lobectomy and probable scarring in right lung base.  Aortic atherosclerosis.  ED treatment: None ------------------------ At bedside, he is able to tell me his name, age, current location, current calendar year.  He endorses shrotnes of brehat with exertion that started on 04/04/23. He also noted right sided abdominal pain. He reports sometimes the abdominal pain is associated with eating and sometimes it just starts. He decribes the pain is sharp, at its worse is 8-9, sometimes waking him up from sleep. Currently the pain is a 3/10. The pain may last 4 minutes to 2 hours.  He denies nausea, vomiting, fever, cough, dysuria, hematuria, blood in his stool.  Social  history: He lives on own. He denies tobacco, etoh, and recreational drug use. 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  ROS: Constitutional: no weight change, no fever ENT/Mouth: no sore throat, no rhinorrhea Eyes: no eye pain, no vision changes Cardiovascular: no chest pain, + dyspnea,  no edema, no palpitations Respiratory: no cough, no sputum, no wheezing Gastrointestinal: no nausea, no vomiting, no diarrhea, no constipation Genitourinary: no urinary incontinence, no dysuria, no hematuria Musculoskeletal: no arthralgias, no myalgias Skin: no skin lesions, no pruritus, Neuro: + weakness, no loss of consciousness, no syncope Psych: no anxiety, no depression, + decrease appetite Heme/Lymph: no bruising, no bleeding  ED Course: Discussed with emergency medicine provider, patient requiring hospitalization for chief concerns of shortness of breath.  Assessment/Plan  Principal Problem:   NSTEMI (non-ST elevated myocardial infarction) (HCC) Active Problems:   Postsurgical hypothyroidism   History of lung cancer   Type 2 diabetes mellitus, controlled, with renal complications (HCC)   Anxiety state   Hyperlipidemia associated with type 2 diabetes mellitus (HCC)   CAD (coronary artery disease), autologous vein bypass graft   COPD, mild (HCC)   Type 2 diabetes mellitus with diabetic neuropathy, unspecified (HCC)   Shortness of breath   Right lateral abdominal pain   Leukocytosis   Assessment and Plan:  * NSTEMI (non-ST elevated myocardial infarction) (HCC) Etiology workup in progress Continue heparin per pharmacy Will recheck high sensitive troponin to ensure downtrending, first recheck at 1600 hrs. followed by 1800 hrs. Patient's troponin continues to elevate, patient may need cardiac  workup and cardiology consultation Patient reports that his cardiologist is Dr. Juliann Pares  Postsurgical hypothyroidism Home levothyroxine 137  mcg daily resumed  Hyperlipidemia associated with type 2 diabetes mellitus (HCC) Atorvastatin resumed  Leukocytosis Workup in progress, liver enzymes ordered  Right lateral abdominal pain Add hepatic function panel, if elevated patient would need right upper quadrant abdominal ultrasound  Type 2 diabetes mellitus with diabetic neuropathy, unspecified (HCC) Insulin SSI with at bedtime coverage ordered Home Jardiance, glipizide, metformin were not resumed on admission  CAD (coronary artery disease), autologous vein bypass graft Atorvastatin 40 mg daily resumed  Chart reviewed.   DVT prophylaxis: Heparin per pharmacy Code Status: DNR/DNI, confirmed with patient at bedside Diet: Heart healthy Family Communication: A phone call was offered multiple times, patient declined stating that he will call his daughter himself Disposition Plan: Pending clinical course Consults called: None at this time Admission status: PCU, inpatient  Past Medical History:  Diagnosis Date   Cancer (HCC) 2012   Stage IIA squamous cell Lung Ca   CHF (congestive heart failure) (HCC) 01/28/2017   Diabetes mellitus    Gout    Hyperlipidemia    Hypertension    Myocardial infarction [facility])    Thyroid cancer (HCC) 2013   s/p thyroidectomy 2014   Past Surgical History:  Procedure Laterality Date   COLONOSCOPY N/A 04/25/2016   Procedure: COLONOSCOPY;  Surgeon: Scot Jun, MD;  Location: [facility] ENDOSCOPY;  Service: Endoscopy;  Laterality: N/A;  Diabetic (oral meds)   COLONOSCOPY WITH PROPOFOL N/A 03/22/2021   Procedure: COLONOSCOPY WITH PROPOFOL;  Surgeon: Toney Reil, MD;  Location: [facility] ENDOSCOPY;  Service: Gastroenterology;  Laterality: N/A;   COLONOSCOPY WITH PROPOFOL N/A 06/29/2022   Procedure: COLONOSCOPY WITH PROPOFOL;  Surgeon: Jaynie Collins, DO;  Location: [facility] ENDOSCOPY;  Service: Gastroenterology;  Laterality: N/A;   CORONARY ARTERY BYPASS GRAFT  2007   LUNG LOBECTOMY     TOTAL  THYROIDECTOMY N/A    Social History:  reports that he quit smoking about 17 years ago. His smoking use included cigarettes. He has been exposed to tobacco smoke. He has never used smokeless tobacco. He reports that he does not drink alcohol and does not use drugs.  Allergies  Allergen Reactions   Penicillins Swelling    Swelling in mouth Swelling in mouth Swelling in mouth   Family History  Problem Relation Age of Onset   Hypertension Other    Heart disease Father    Family history: Family history reviewed and not pertinent.  Prior to Admission medications   Medication Sig Start Date End Date Taking? Authorizing Provider  apixaban (ELIQUIS) 5 MG TABS tablet Take 1 tablet (5 mg total) by mouth 2 (two) times daily. 03/08/23   Sherlene Shams, MD  atorvastatin (LIPITOR) 40 MG tablet TAKE 1 TABLET BY MOUTH EVERY DAY 12/06/22   Sherlene Shams, MD  blood glucose meter kit and supplies KIT Dispense based on patient and insurance preference. Use up to four times daily as directed. (FOR ICD-10 E11.29). 02/11/18   Sherlene Shams, MD  empagliflozin (JARDIANCE) 10 MG TABS tablet Take 1 tablet (10 mg total) by mouth daily before breakfast. 03/19/23   Sherlene Shams, MD  gabapentin (NEURONTIN) 300 MG capsule Take 1 capsule (300 mg total) by mouth 4 (four) times daily. 03/08/23   Sherlene Shams, MD  glipiZIDE (GLUCOTROL XL) 5 MG 24 hr tablet TAKE 1 TABLET BY MOUTH EVERY DAY WITH BREAKFAST 11/20/22   Sherlene Shams,  MD  levothyroxine (SYNTHROID) 137 MCG tablet Take 1 tablet (137 mcg total) by mouth daily before breakfast. 02/14/23   Arnetha Courser, MD  losartan (COZAAR) 50 MG tablet TAKE 1 TABLET BY MOUTH EVERY DAY 08/28/22   Sherlene Shams, MD  metFORMIN (GLUCOPHAGE-XR) 750 MG 24 hr tablet TAKE 1 TABLET BY MOUTH EVERY DAY WITH BREAKFAST 02/06/23   Sherlene Shams, MD  omeprazole (PRILOSEC) 20 MG capsule Take 20 mg by mouth daily. 03/08/23   [provider]  [facility] DELICA LANCETS FINE MISC USE AS  DIRECTED 4 TIMES A DAY 03/06/18   Sherlene Shams, MD  [facility] ULTRA test strip USE AS DIRECTED 4 TIMES A DAY 11/14/22   Sherlene Shams, MD  tamsulosin (FLOMAX) 0.4 MG CAPS capsule TAKE 1 CAPSULE BY MOUTH EVERY DAY 02/22/23   Sherlene Shams, MD    Physical Exam: Vitals:   04/09/23 0946 04/09/23 0947 04/09/23 1215  BP: 123/73  126/77  Pulse: 82  78  Resp: (!) 22  14  Temp: 97.9 F (36.6 C)    SpO2: 94%  96%  Weight:  74 kg   Height:  5\' 9"  (1.753 m)    Constitutional: appears age-appropriate, frail Eyes: PERRL, lids and conjunctivae normal ENMT: Mucous membranes are moist. Posterior pharynx clear of any exudate or lesions. Age-appropriate dentition. Hearing appropriate Neck: normal, supple, no masses, no thyromegaly Respiratory: clear to auscultation bilaterally, no wheezing, no crackles. Normal respiratory effort. No accessory muscle use.  Cardiovascular: Regular rate and rhythm, no murmurs / rubs / gallops. No extremity edema. 2+ pedal pulses. No carotid bruits.  Abdomen: no tenderness, no masses palpated, no hepatosplenomegaly. Bowel sounds positive.  Musculoskeletal: no clubbing / cyanosis. No joint deformity upper and lower extremities. Good ROM, no contractures, no atrophy. Normal muscle tone.  Skin: no rashes, lesions, ulcers. No induration Neurologic: Sensation intact. Strength 5/5 in all 4.  Psychiatric: Normal judgment and insight. Alert and oriented x 3. Normal mood.   EKG: independently reviewed, showing sinus tachycardia with rate of 102, QTc 443  Chest x-ray on Admission: I personally reviewed and I agree with radiologist reading as below.  DG Chest 2 View  Result Date: 04/09/2023 CLINICAL DATA:  Lung cancer.  Shortness of breath EXAM: CHEST - 2 VIEW COMPARISON:  X-ray 02/12/2023 FINDINGS: Stable right IJ chest port with tip along the upper SVC. Persistent small pleural effusions and right lung base parenchymal opacity. No pneumothorax or edema. Normal  cardiopericardial silhouette. Sternal wires. Overlapping cardiac leads. Please see separate dictation of CT angiogram from same day IMPRESSION: Postop chest with port. Persistent small effusions and lung base opacities, right-greater-than-left. Please correlate with separate CT scan Electronically Signed   By: Karen Kays M.D.   On: 04/09/2023 12:43   CT Angio Chest PE W and/or Wo Contrast  Result Date: 04/09/2023 CLINICAL DATA:  Shortness of breath.  History of lung cancer. EXAM: CT ANGIOGRAPHY CHEST WITH CONTRAST TECHNIQUE: Multidetector CT imaging of the chest was performed using the standard protocol during bolus administration of intravenous contrast. Multiplanar CT image reconstructions and MIPs were obtained to evaluate the vascular anatomy. RADIATION DOSE REDUCTION: This exam was performed according to the departmental dose-optimization program which includes automated exposure control, adjustment of the mA and/or kV according to patient size and/or use of iterative reconstruction technique. CONTRAST:  80mL OMNIPAQUE IOHEXOL 350 MG/ML SOLN COMPARISON:  March 28, 2023.  February 12, 2023. FINDINGS: Cardiovascular: Stable rounded defect is noted in lower lobe branch  of right pulmonary artery compared to prior exam of February 12, 2023, concerning for chronic or residual pulmonary embolus. No new embolus is noted. Status post coronary artery bypass graft. Mediastinum/Nodes: Status post thyroidectomy. Esophagus is unremarkable. No significant adenopathy. Lungs/Pleura: No pneumothorax or pleural effusion is noted. Status post right lower lobectomy. Stable probable scarring seen in right lung base. 21 x 15 mm pleural base nodule is again noted posteriorly in left lower lobe best seen on image number 71 of series 5. This is slightly enlarged and is concerning for possible malignancy or metastatic disease. Emphysematous disease is noted. Upper Abdomen: Cholelithiasis. Musculoskeletal: No chest wall abnormality.  No acute or significant osseous findings. Review of the MIP images confirms the above findings. IMPRESSION: Stable rounded filling defect seen in lower lobe branch of right pulmonary artery compared to prior exam of February 12, 2023, concerning for chronic or residual pulmonary embolus. No new pulmonary embolus is noted. Status post right lower lobectomy with probable scarring seen in right lung base. 21 x 15 mm pleural based nodule noted posteriorly in left lower lobe which may be slightly enlarged compared to prior exam and is concerning for possible malignancy or metastatic disease. Cholelithiasis. Aortic Atherosclerosis (ICD10-I70.0) and Emphysema (ICD10-J43.9). Electronically Signed   By: Lupita Raider M.D.   On: 04/09/2023 11:47    Labs on Admission: I have personally reviewed following labs CBC: Recent Labs  Lab 04/09/23 0954  WBC 11.0*  HGB 11.8*  HCT 37.6*  MCV 86.8  PLT 315   Basic Metabolic Panel: Recent Labs  Lab 04/09/23 0954  NA 132*  K 4.6  CL 100  CO2 23  GLUCOSE 177*  BUN 31*  CREATININE 1.97*  CALCIUM 8.7*   GFR: Estimated Creatinine Clearance: 32.4 mL/min (A) (by C-G formula based on SCr of 1.97 mg/dL (H)).  Urine analysis:    Component Value Date/Time   COLORURINE YELLOW (A) 05/25/2022 0927   APPEARANCEUR CLEAR (A) 05/25/2022 0927   APPEARANCEUR Clear 01/29/2012 1912   LABSPEC 1.011 05/25/2022 0927   LABSPEC 1.019 01/29/2012 1912   PHURINE 5.0 05/25/2022 0927   GLUCOSEU >=500 (A) 05/25/2022 0927   GLUCOSEU >=1000 (A) 03/02/2021 0915   HGBUR SMALL (A) 05/25/2022 0927   BILIRUBINUR NEGATIVE 05/25/2022 0927   BILIRUBINUR Negative 01/29/2012 1912   KETONESUR NEGATIVE 05/25/2022 0927   PROTEINUR 30 (A) 05/25/2022 0927   UROBILINOGEN 0.2 03/02/2021 0915   NITRITE NEGATIVE 05/25/2022 0927   LEUKOCYTESUR NEGATIVE 05/25/2022 0927   LEUKOCYTESUR Negative 01/29/2012 1912   CRITICAL CARE Performed by: Dr. Sedalia Muta  Total critical care time: 32  minutes  Critical care time was exclusive of separately billable procedures and treating other patients.  Critical care was necessary to treat or prevent imminent or life-threatening deterioration.  Critical care was time spent personally by me on the following activities: development of treatment plan with patient and/or surrogate as well as nursing, discussions with consultants, evaluation of patient's response to treatment, examination of patient, obtaining history from patient or surrogate, ordering and performing treatments and interventions, ordering and review of laboratory studies, ordering and review of radiographic studies, pulse oximetry and re-evaluation of patient's condition.  This document was prepared using Dragon Voice Recognition software and may include unintentional dictation errors.  Dr. Sedalia Muta [facility]  If 7PM-7AM, please contact overnight-coverage provider If 7AM-7PM, please contact day attending provider [email or web address]  04/09/2023, 5:23 PM

## 2023-04-09 NOTE — Plan of Care (Signed)

## 2023-04-09 NOTE — Assessment & Plan Note (Addendum)
On Atorvastatin 40 mg - continue.  Cardiology resumed ASA, but pt declines to take it, citing he was told to stop ASA when started on Eliquis

## 2023-04-09 NOTE — Assessment & Plan Note (Addendum)
Covered with sliding scale insulin  Home Jardiance, glipizide, metformin held on admission -- resume home meds at d/c

## 2023-04-09 NOTE — ED Notes (Signed)
Pt ambulated to bathroom 

## 2023-04-09 NOTE — Assessment & Plan Note (Signed)
Workup in progress, liver enzymes ordered

## 2023-04-09 NOTE — Discharge Instructions (Signed)
As we discussed please follow-up with your oncologist as soon as possible regarding your lung lesion in the left lower lobe.  Return to the emergency department for any acute worsening of shortness of breath chest pain or any other symptom personally concerning to yourself.

## 2023-04-10 DIAGNOSIS — I214 Non-ST elevation (NSTEMI) myocardial infarction: Secondary | ICD-10-CM | POA: Diagnosis not present

## 2023-04-10 LAB — GLUCOSE, CAPILLARY
Glucose-Capillary: 145 mg/dL — ABNORMAL HIGH (ref 70–99)
Glucose-Capillary: 147 mg/dL — ABNORMAL HIGH (ref 70–99)
Glucose-Capillary: 119 mg/dL — ABNORMAL HIGH (ref 70–99)
Glucose-Capillary: 147 mg/dL — ABNORMAL HIGH (ref 70–99)

## 2023-04-10 LAB — TROPONIN I (HIGH SENSITIVITY)
Troponin I (High Sensitivity): 176 ng/L (ref <18)
Troponin I (High Sensitivity): 154 ng/L (ref <18)

## 2023-04-10 LAB — BASIC METABOLIC PANEL
Anion gap: 8 (ref 5–15)
BUN: 30 mg/dL — ABNORMAL HIGH (ref 8–23)
CO2: 24 mmol/L (ref 22–32)
Calcium: 8.4 mg/dL — ABNORMAL LOW (ref 8.9–10.3)
Chloride: 100 mmol/L (ref 98–111)
Creatinine, Ser: 1.59 mg/dL — ABNORMAL HIGH (ref 0.61–1.24)
GFR, Estimated: 45 mL/min — ABNORMAL LOW (ref >60)
Glucose, Bld: 114 mg/dL — ABNORMAL HIGH (ref 70–99)
Potassium: 3.9 mmol/L (ref 3.5–5.1)
Sodium: 132 mmol/L — ABNORMAL LOW (ref 135–145)

## 2023-04-10 LAB — CBC
HCT: 31.5 % — ABNORMAL LOW (ref 39.0–52.0)
Hemoglobin: 10.4 g/dL — ABNORMAL LOW (ref 13.0–17.0)
MCH: 27.4 pg (ref 26.0–34.0)
MCHC: 33 g/dL (ref 30.0–36.0)
MCV: 83.1 fL (ref 80.0–100.0)
Platelets: 294 10*3/uL (ref 150–400)
RBC: 3.79 MIL/uL — ABNORMAL LOW (ref 4.22–5.81)
RDW: 14.6 % (ref 11.5–15.5)
WBC: 8.9 10*3/uL (ref 4.0–10.5)
nRBC: 0 % (ref 0.0–0.2)

## 2023-04-10 LAB — HEPARIN LEVEL (UNFRACTIONATED): Heparin Unfractionated: >1.1 [IU]/mL — ABNORMAL HIGH (ref 0.30–0.70)

## 2023-04-10 LAB — APTT
aPTT: 49 s — ABNORMAL HIGH (ref 24–36)
aPTT: 52 s — ABNORMAL HIGH (ref 24–36)
aPTT: 52 s — ABNORMAL HIGH (ref 24–36)
aPTT: 76 s — ABNORMAL HIGH (ref 24–36)

## 2023-04-10 MED ORDER — ASPIRIN 81 MG PO CHEW
81.0000 mg | CHEWABLE_TABLET | Freq: Every day | ORAL | Status: DC
  Administered 2023-04-10: 81 mg via ORAL
  Filled 2023-04-10 (×3): qty 1

## 2023-04-10 MED ORDER — ROSUVASTATIN CALCIUM 10 MG PO TABS
40.0000 mg | ORAL_TABLET | Freq: Every day | ORAL | Status: DC
  Administered 2023-04-10 – 2023-04-12 (×3): 40 mg via ORAL
  Filled 2023-04-10 (×3): qty 4

## 2023-04-10 MED ORDER — HEPARIN BOLUS VIA INFUSION
2200.0000 [IU] | Freq: Once | INTRAVENOUS | Status: AC
  Administered 2023-04-10: 2200 [IU] via INTRAVENOUS
  Filled 2023-04-10: qty 2200

## 2023-04-10 NOTE — Consult Note (Signed)
Pharmacy Consult Note - Anticoagulation  Pharmacy Consult for heparin Indication: chest pain/ACS  PATIENT MEASUREMENTS: Height: 5\' 9"  (175.3 cm) Weight: 74 kg (163 lb 2.3 oz) IBW/kg (Calculated) : 70.7 HEPARIN DW (KG): 74  VITAL SIGNS: Temp: 98 F (36.7 C) (10/16 0724) Temp Source: Oral (10/16 0724) BP: 101/57 (10/16 1127) Pulse Rate: 86 (10/16 1127)  Recent Labs    04/09/23 0954 04/09/23 1216 04/10/23 0430 04/10/23 0710  HGB 11.8*  --  10.4*  --   HCT 37.6*  --  31.5*  --   PLT 315  --  294  --   APTT 51*   < > 52*  --   LABPROT 21.3*  --   --   --   INR 1.8*  --   --   --   HEPARINUNFRC  --   --  >1.10*  --   CREATININE 1.97*  --  1.59*  --   TROPONINIHS 87*   < > 176* 154*   < > = values in this interval not displayed.    Estimated Creatinine Clearance: 40.1 mL/min (A) (by C-G formula based on SCr of 1.59 mg/dL (H)).  PAST MEDICAL HISTORY: Past Medical History:  Diagnosis Date   Cancer (HCC) 2012   Stage IIA squamous cell Lung Ca   CHF (congestive heart failure) (HCC) 01/28/2017   Diabetes mellitus    Gout    Hyperlipidemia    Hypertension    Myocardial infarction [facility])    Thyroid cancer (HCC) 2013   s/p thyroidectomy 2014    ASSESSMENT:1 77 y.o. male with PMH pulmonary embolism (on apixaban since 01/2023), lung cancer s/p RLL lobectomy, HFpEF (01/2023 EF 60-65%) is presenting with ACS. He endorses shortness of breath since Friday that worsens after minimal exertion. Patient has been adherent to apixaban and imaging is negative for PE but is concerning for cancer recurrence. Cardiac findings include ECG showing sinus tachycardia with borderline ST depression, and cTn currently trending up, from 87 to 176. BNP is 322. Pharmacy has been consulted to initiate and manage heparin intravenous infusion.  Pertinent medications: Apixaban 5 mg twice daily for PE  Goal(s) of therapy: Heparin level 0.3 - 0.7 units/mL Monitor platelets by anticoagulation protocol: Yes    Baseline anticoagulation labs: Recent Labs    04/09/23 0954 04/10/23 0019 04/10/23 0430  APTT 51* 52* 52*  INR 1.8*  --   --   HGB 11.8*  --  10.4*  PLT 315  --  294    Date Time aPTT/HL Rate/Comment 10/16 0019 52 / --  Subtherapeutic 10/16 0430 52  Subtherapeutic @1050  units/hr (done 5hrs early)  PLAN: No collection done at 9am, last aPTT done 5 hours early after that ordered and only 3 hrs after heparin bolus and rate changed. Will order aPTT for 1300, then adjust heparin therapy as needed. Continue to titrate by aPTT until heparin level and aPTT correlate and/or apixaban washes out, then titrate by heparin level alone. Check heparin level with next AM labs. Continue to monitor CBC daily while on heparin infusion.  Alexio Sroka Rodriguez-Guzman PharmD, BCPS 04/10/2023 1:00 PM

## 2023-04-10 NOTE — Consult Note (Signed)
Pharmacy Consult Note - Anticoagulation  Pharmacy Consult for heparin Indication: chest pain/ACS  PATIENT MEASUREMENTS: Height: 5\' 9"  (175.3 cm) Weight: 74 kg (163 lb 2.3 oz) IBW/kg (Calculated) : 70.7 HEPARIN DW (KG): 74  VITAL SIGNS: Temp: 97.7 F (36.5 C) (10/16 1953) Temp Source: Oral (10/16 1953) BP: 108/67 (10/16 1953) Pulse Rate: 83 (10/16 1953)  Recent Labs    04/09/23 0954 04/09/23 1216 04/10/23 0430 04/10/23 0710 04/10/23 1309 04/10/23 2238  HGB 11.8*  --  10.4*  --   --   --   HCT 37.6*  --  31.5*  --   --   --   PLT 315  --  294  --   --   --   APTT 51*   < > 52*  --    < > 76*  LABPROT 21.3*  --   --   --   --   --   INR 1.8*  --   --   --   --   --   HEPARINUNFRC  --   --  >1.10*  --   --   --   CREATININE 1.97*  --  1.59*  --   --   --   TROPONINIHS 87*   < > 176* 154*  --   --    < > = values in this interval not displayed.    Estimated Creatinine Clearance: 40.1 mL/min (A) (by C-G formula based on SCr of 1.59 mg/dL (H)).  PAST MEDICAL HISTORY: Past Medical History:  Diagnosis Date   Cancer (HCC) 2012   Stage IIA squamous cell Lung Ca   CHF (congestive heart failure) (HCC) 01/28/2017   Diabetes mellitus    Gout    Hyperlipidemia    Hypertension    Myocardial infarction [facility])    Thyroid cancer (HCC) 2013   s/p thyroidectomy 2014    ASSESSMENT:1 77 y.o. male with PMH pulmonary embolism (on apixaban since 01/2023), lung cancer s/p RLL lobectomy, HFpEF (01/2023 EF 60-65%) is presenting with ACS. He endorses shortness of breath since Friday that worsens after minimal exertion. Patient has been adherent to apixaban and imaging is negative for PE but is concerning for cancer recurrence. Cardiac findings include ECG showing sinus tachycardia with borderline ST depression, and cTn currently trending up, from 87 to 176. BNP is 322. Pharmacy has been consulted to initiate and manage heparin intravenous infusion.  Pertinent medications: Apixaban 5 mg twice  daily for PE  Goal(s) of therapy: Heparin level 0.3 - 0.7 units/mL Monitor platelets by anticoagulation protocol: Yes   Baseline anticoagulation labs: Recent Labs    04/09/23 0954 04/10/23 0019 04/10/23 0430 04/10/23 1309 04/10/23 2238  APTT 51*   < > 52* 49* 76*  INR 1.8*  --   --   --   --   HGB 11.8*  --  10.4*  --   --   PLT 315  --  294  --   --    < > = values in this interval not displayed.    Date Time aPTT/HL Rate/Comment 10/16 0019 52 / --  Subtherapeutic 10/16 0430 52  Subtherapeutic @1050  units/hr (done 5hrs early) 10/16 1309 49  Subtherapeutic 10/17 2238 76 / --  aPTT Therapeutic x 1  PLAN: Continue infusion rate at 1300 un/hr Repeat aptt w/ AM labs to confirm. Continue to titrate by aPTT until heparin level and aPTT correlate and/or apixaban washes out, then titrate by heparin level alone. Check heparin  level with next AM labs. Continue to monitor CBC daily while on heparin infusion.  Nathaniel Lane, PharmD, [facility] 04/10/2023 11:32 PM

## 2023-04-10 NOTE — Consult Note (Signed)
[facility] CLINIC CARDIOLOGY CONSULT NOTE       [name] MRN: [id number] DOB/AGE: 01-11-1946 77 y.o.  Admit date: 04/09/2023 Referring Physician Dr. Esaw Grandchild Primary Physician Dr. Darrick Huntsman Primary Cardiologist Marijo Conception, NP/Dr. Juliann Pares Reason for Consultation NSTEMI  HPI: Nathaniel Lane is a 77 y.o. male  with a past medical history of coronary artery disease s/p CABG x2 2007, ischemic cardiomyopathy, chronic systolic heart failure, peripheral vascular disease, hypertension, hyperlipidemia, recent PE who presented to the ED on 04/09/2023 for shortness of breath. Cardiology was consulted for further evaluation.   Patient reports that he had overall been doing well until last Thursday when he began experiencing dyspnea on exertion.  States that this came on suddenly and persisted throughout the weekend.  Recently was diagnosed with PE in August.  He saw his PCP yesterday and was sent to the ED for further evaluation.  Workup in the ED notable for creatinine 1.97, potassium 4.6, sodium 132, hemoglobin 11.8, WBC 11.0.  BNP 322.  Troponins trended 87 > 176 > 286 > 291 > 176 > 154.  EKG with normal sinus rhythm, nonacute.  CTA chest demonstrated stable filling defect concerning for chronic PE.  He was started on IV heparin in the ED.  At the time of my evaluation this afternoon patient is sitting upright in bedside chair.  States that he feels well overall.  Has not walked around much due to illicit symptoms.  States that when he is at rest he feels fine.  He denies any recent episodes of chest pain, palpitations, dizziness.  States that his main issue has been shortness of breath.  Overall he feels that his shortness of breath initially improved after being diagnosed with PE in August but recurred on Thursday and has been persistent since this time.  Review of systems complete and found to be negative unless listed above    Past Medical History:  Diagnosis Date   Cancer  (HCC) 2012   Stage IIA squamous cell Lung Ca   CHF (congestive heart failure) (HCC) 01/28/2017   Diabetes mellitus    Gout    Hyperlipidemia    Hypertension    Myocardial infarction [facility])    Thyroid cancer (HCC) 2013   s/p thyroidectomy 2014    Past Surgical History:  Procedure Laterality Date   COLONOSCOPY N/A 04/25/2016   Procedure: COLONOSCOPY;  Surgeon: Scot Jun, MD;  Location: [facility] ENDOSCOPY;  Service: Endoscopy;  Laterality: N/A;  Diabetic (oral meds)   COLONOSCOPY WITH PROPOFOL N/A 03/22/2021   Procedure: COLONOSCOPY WITH PROPOFOL;  Surgeon: Toney Reil, MD;  Location: [facility] ENDOSCOPY;  Service: Gastroenterology;  Laterality: N/A;   COLONOSCOPY WITH PROPOFOL N/A 06/29/2022   Procedure: COLONOSCOPY WITH PROPOFOL;  Surgeon: Jaynie Collins, DO;  Location: [facility] ENDOSCOPY;  Service: Gastroenterology;  Laterality: N/A;   CORONARY ARTERY BYPASS GRAFT  2007   LUNG LOBECTOMY     TOTAL THYROIDECTOMY N/A     Medications Prior to Admission  Medication Sig Dispense Refill Last Dose   apixaban (ELIQUIS) 5 MG TABS tablet Take 1 tablet (5 mg total) by mouth 2 (two) times daily. 60 tablet 5    atorvastatin (LIPITOR) 40 MG tablet TAKE 1 TABLET BY MOUTH EVERY DAY 90 tablet 3    blood glucose meter kit and supplies KIT Dispense based on patient and insurance preference. Use up to four times daily as directed. (FOR ICD-10 E11.29). 1 each 0    empagliflozin (JARDIANCE) 10 MG  TABS tablet Take 1 tablet (10 mg total) by mouth daily before breakfast. 30 tablet 2    gabapentin (NEURONTIN) 300 MG capsule Take 1 capsule (300 mg total) by mouth 4 (four) times daily. 360 capsule 0    glipiZIDE (GLUCOTROL XL) 5 MG 24 hr tablet TAKE 1 TABLET BY MOUTH EVERY DAY WITH BREAKFAST 90 tablet 3    levothyroxine (SYNTHROID) 137 MCG tablet Take 1 tablet (137 mcg total) by mouth daily before breakfast. 90 tablet 1    losartan (COZAAR) 50 MG tablet TAKE 1 TABLET BY MOUTH EVERY DAY 90 tablet 3    metFORMIN  (GLUCOPHAGE-XR) 750 MG 24 hr tablet TAKE 1 TABLET BY MOUTH EVERY DAY WITH BREAKFAST 90 tablet 1    omeprazole (PRILOSEC) 20 MG capsule Take 20 mg by mouth daily.      ONETOUCH DELICA LANCETS FINE MISC USE AS DIRECTED 4 TIMES A DAY 100 each 2    ONETOUCH ULTRA test strip USE AS DIRECTED 4 TIMES A DAY 100 strip 2    tamsulosin (FLOMAX) 0.4 MG CAPS capsule TAKE 1 CAPSULE BY MOUTH EVERY DAY 90 capsule 1    Social History   Socioeconomic History   Marital status: Widowed    Spouse name: Not on file   Number of children: Not on file   Years of education: Not on file   Highest education level: Not on file  Occupational History   Occupation: Parts Delivery    Employer: retired  Tobacco Use   Smoking status: Former    Current packs/day: 0.00    Types: Cigarettes    Quit date: 07/15/2005    Years since quitting: 17.7    Passive exposure: Past   Smokeless tobacco: Never  Vaping Use   Vaping status: Never Used  Substance and Sexual Activity   Alcohol use: No   Drug use: No   Sexual activity: Not on file  Other Topics Concern   Not on file  Social History Narrative   Lives alone; wife passed away 05-Jul-2019   Social Determinants of Health   Financial Resource Strain: Low Risk  (08/06/2022)   Overall Financial Resource Strain (CARDIA)    Difficulty of Paying Living Expenses: Not hard at all  Food Insecurity: No Food Insecurity (04/09/2023)   Hunger Vital Sign    Worried About Running Out of Food in the Last Year: Never true    Ran Out of Food in the Last Year: Never true  Transportation Needs: No Transportation Needs (04/09/2023)   PRAPARE - Administrator, Civil Service (Medical): No    Lack of Transportation (Non-Medical): No  Physical Activity: Insufficiently Active (08/06/2022)   Exercise Vital Sign    Days of Exercise per Week: 4 days    Minutes of Exercise per Session: 30 min  Stress: No Stress Concern Present (08/06/2022)   [employer] of Occupational Health -  Occupational Stress Questionnaire    Feeling of Stress : Not at all  Social Connections: Unknown (08/06/2022)   Social Connection and Isolation Panel [NHANES]    Frequency of Communication with Friends and Family: More than three times a week    Frequency of Social Gatherings with Friends and Family: More than three times a week    Attends Religious Services: Not on file    Active Member of Clubs or Organizations: Yes    Attends Banker Meetings: More than 4 times per year    Marital Status: Widowed  Intimate Partner Violence:  Not At Risk (04/09/2023)   Humiliation, Afraid, Rape, and Kick questionnaire    Fear of Current or Ex-Partner: No    Emotionally Abused: No    Physically Abused: No    Sexually Abused: No    Family History  Problem Relation Age of Onset   Hypertension Other    Heart disease Father      Vitals:   04/09/23 2310 04/10/23 0339 04/10/23 0724 04/10/23 0724  BP: (!) 106/54 115/75 124/61 124/61  Pulse: 89 78 93 92  Resp: 18 18    Temp: 98.1 F (36.7 C) (!) 97.5 F (36.4 C) 98 F (36.7 C) 98 F (36.7 C)  TempSrc: Oral Oral Oral Oral  SpO2: 92% 92% 97% 97%  Weight:      Height:        PHYSICAL EXAM General: Well appearing male, well nourished, in no acute distress sitting upright in bedside chair. HEENT: Normocephalic and atraumatic. Neck: No JVD.  Lungs: Normal respiratory effort on room air. Clear bilaterally to auscultation. No wheezes, crackles, rhonchi.  Heart: HRRR. Normal S1 and S2 without gallops or murmurs.  Abdomen: Non-distended appearing.  Msk: Normal strength and tone for age. Extremities: Warm and well perfused. No clubbing, cyanosis. No edema.  Neuro: Alert and oriented X 3. Psych: Answers questions appropriately.   Labs: Basic Metabolic Panel: Recent Labs    04/09/23 0954 04/10/23 0430  NA 132* 132*  K 4.6 3.9  CL 100 100  CO2 23 24  GLUCOSE 177* 114*  BUN 31* 30*  CREATININE 1.97* 1.59*  CALCIUM 8.7* 8.4*    Liver Function Tests: Recent Labs    04/09/23 1910  AST 41  ALT 46*  ALKPHOS 82  BILITOT 0.4  PROT 6.7  ALBUMIN 2.4*   No results for input(s): "LIPASE", "AMYLASE" in the last 72 hours. CBC: Recent Labs    04/09/23 0954 04/10/23 0430  WBC 11.0* 8.9  HGB 11.8* 10.4*  HCT 37.6* 31.5*  MCV 86.8 83.1  PLT 315 294   Cardiac Enzymes: Recent Labs    04/09/23 2105 04/10/23 0430 04/10/23 0710  TROPONINIHS 291* 176* 154*   BNP: Recent Labs    04/09/23 0954  BNP 322.1*   D-Dimer: No results for input(s): "DDIMER" in the last 72 hours. Hemoglobin A1C: No results for input(s): "HGBA1C" in the last 72 hours. Fasting Lipid Panel: No results for input(s): "CHOL", "HDL", "LDLCALC", "TRIG", "CHOLHDL", "LDLDIRECT" in the last 72 hours. Thyroid Function Tests: No results for input(s): "TSH", "T4TOTAL", "T3FREE", "THYROIDAB" in the last 72 hours.  Invalid input(s): "FREET3" Anemia Panel: No results for input(s): "VITAMINB12", "FOLATE", "FERRITIN", "TIBC", "IRON", "RETICCTPCT" in the last 72 hours.   Radiology: DG Chest 2 View  Result Date: 04/09/2023 CLINICAL DATA:  Lung cancer.  Shortness of breath EXAM: CHEST - 2 VIEW COMPARISON:  X-ray 02/12/2023 FINDINGS: Stable right IJ chest port with tip along the upper SVC. Persistent small pleural effusions and right lung base parenchymal opacity. No pneumothorax or edema. Normal cardiopericardial silhouette. Sternal wires. Overlapping cardiac leads. Please see separate dictation of CT angiogram from same day IMPRESSION: Postop chest with port. Persistent small effusions and lung base opacities, right-greater-than-left. Please correlate with separate CT scan Electronically Signed   By: Karen Kays M.D.   On: 04/09/2023 12:43   CT Angio Chest PE W and/or Wo Contrast  Result Date: 04/09/2023 CLINICAL DATA:  Shortness of breath.  History of lung cancer. EXAM: CT ANGIOGRAPHY CHEST WITH CONTRAST TECHNIQUE: Multidetector CT imaging  of the  chest was performed using the standard protocol during bolus administration of intravenous contrast. Multiplanar CT image reconstructions and MIPs were obtained to evaluate the vascular anatomy. RADIATION DOSE REDUCTION: This exam was performed according to the departmental dose-optimization program which includes automated exposure control, adjustment of the mA and/or kV according to patient size and/or use of iterative reconstruction technique. CONTRAST:  80mL OMNIPAQUE IOHEXOL 350 MG/ML SOLN COMPARISON:  March 28, 2023.  February 12, 2023. FINDINGS: Cardiovascular: Stable rounded defect is noted in lower lobe branch of right pulmonary artery compared to prior exam of February 12, 2023, concerning for chronic or residual pulmonary embolus. No new embolus is noted. Status post coronary artery bypass graft. Mediastinum/Nodes: Status post thyroidectomy. Esophagus is unremarkable. No significant adenopathy. Lungs/Pleura: No pneumothorax or pleural effusion is noted. Status post right lower lobectomy. Stable probable scarring seen in right lung base. 21 x 15 mm pleural base nodule is again noted posteriorly in left lower lobe best seen on image number 71 of series 5. This is slightly enlarged and is concerning for possible malignancy or metastatic disease. Emphysematous disease is noted. Upper Abdomen: Cholelithiasis. Musculoskeletal: No chest wall abnormality. No acute or significant osseous findings. Review of the MIP images confirms the above findings. IMPRESSION: Stable rounded filling defect seen in lower lobe branch of right pulmonary artery compared to prior exam of February 12, 2023, concerning for chronic or residual pulmonary embolus. No new pulmonary embolus is noted. Status post right lower lobectomy with probable scarring seen in right lung base. 21 x 15 mm pleural based nodule noted posteriorly in left lower lobe which may be slightly enlarged compared to prior exam and is concerning for possible malignancy or  metastatic disease. Cholelithiasis. Aortic Atherosclerosis (ICD10-I70.0) and Emphysema (ICD10-J43.9). Electronically Signed   By: Lupita Raider M.D.   On: 04/09/2023 11:47   CT Chest W Contrast  Result Date: 03/28/2023 CLINICAL DATA:  Monitoring non-small-cell lung cancer. * Tracking Code: BO * EXAM: CT CHEST WITH CONTRAST TECHNIQUE: Multidetector CT imaging of the chest was performed during intravenous contrast administration. RADIATION DOSE REDUCTION: This exam was performed according to the departmental dose-optimization program which includes automated exposure control, adjustment of the mA and/or kV according to patient size and/or use of iterative reconstruction technique. CONTRAST:  60mL OMNIPAQUE IOHEXOL 300 MG/ML  SOLN COMPARISON:  CT angiogram chest 02/12/2023.  Older chest CT exam. FINDINGS: Cardiovascular: Status post median sternotomy. Right upper chest port with the tip seen as far as the upper SVC. The right brachiocephalic vein is somewhat small as is the internal jugular vein. Please correlate with history. Coronary artery calcifications are seen. The heart is nonenlarged. No pericardial effusion. The thoracic aorta has some partially calcified atherosclerotic plaque diffusely. As on the prior once again there is some nonocclusive thrombus in the right interlobar pulmonary artery. Extent and distribution is similar to previous. Again this has previously described. Mediastinum/Nodes: Normal caliber thoracic esophagus. Small thyroid gland. No specific abnormal lymph node enlargement identified in the axillary regions, right hilum. There are some small nodes in the right hilum and subcarinal mediastinum which are unchanged from previous and not pathologic by size criteria. Lungs/Pleura: Centrilobular emphysematous changes. Persistent left-sided pleural thickening with some calcifications. Juxtapleural on the left posteriorly in the superior segment of the lower lobe once again is a spiculated  nodular area which previously measured 18 x 15 mm and today 21 by 17 mm. Please correlate with any prior workup for neoplasm. There are surgical  changes of previous right lower lobectomy. Adjacent parenchymal opacity which is confluence is similar but the adjacent ill-defined opacities and interstitial thickening is improving. No new right-sided lung mass. Upper Abdomen: Left adrenal nodule is again seen. Previously measuring up to 16 mm and today 16 mm on series 2, image 162. Stones in the nondilated gallbladder. Musculoskeletal: Degenerative changes along the spine. Bridging osteophytes and syndesmophytes. Multilevel Schmorl's node deformities. IMPRESSION: Previous right lower lobectomy. The confluence opacity at the right lung base is stable. The surrounding interstitial thickening and ground-glass is improving in the interval. Persistent left-sided pleural thickening and calcification however the associated juxtapleural spiculated nodule in the left lower lobe superior segment is slightly larger today. Please correlate for any history of neoplasm or further workup of this lesion. Stable small lymph nodes in the mediastinum and right hilum. Stable appearance of the previously described nonocclusive thrombus in the right interlobar pulmonary artery. Stable left adrenal nodule. Gallstones. Aortic Atherosclerosis (ICD10-I70.0) and Emphysema (ICD10-J43.9). Electronically Signed   By: Karen Kays M.D.   On: 03/28/2023 10:15    ECHO 01/2023: 1. Left ventricular ejection fraction, by estimation, is 60 to 65%. The left ventricle has normal function. The left ventricle demonstrates regional wall motion abnormalities (basal to mid inferior wall hypokinesis). There is mild left ventricular  hypertrophy. Left ventricular diastolic parameters are consistent with Grade I diastolic dysfunction (impaired relaxation).   2. Right ventricular systolic function is normal. The right ventricular ize is normal. There is normal  pulmonary artery systolic pressure. The estimated right ventricular systolic pressure is 19.7 mmHg.   3. The mitral valve is normal in structure. Mild mitral valve regurgitation. No evidence of mitral stenosis.   4. The aortic valve is normal in structure. Aortic valve regurgitation is not visualized. No aortic stenosis is present.   5. The inferior vena cava is normal in size with greater than 50% respiratory variability, suggesting right atrial pressure of 3 mmHg.   TELEMETRY reviewed by me 04/10/2023: sinus rhythm PVCs rate 90s  EKG reviewed by me: sinus rhythm PACs rate 93 bpm, nonacute  Data reviewed by me 04/10/2023: last 24h vitals tele labs imaging I/O ED provider note, admission H&P  Principal Problem:   NSTEMI (non-ST elevated myocardial infarction) (HCC) Active Problems:   Hyperlipidemia associated with type 2 diabetes mellitus (HCC)   CAD (coronary artery disease), autologous vein bypass graft   Type 2 diabetes mellitus, controlled, with renal complications (HCC)   History of lung cancer   Postsurgical hypothyroidism   Anxiety state   COPD, mild (HCC)   Type 2 diabetes mellitus with diabetic neuropathy, unspecified (HCC)   Shortness of breath   Right lateral abdominal pain   Leukocytosis    ASSESSMENT AND PLAN:  Nathaniel Lane is a 77 y.o. male  with a past medical history of coronary artery disease s/p CABG x2 2007, ischemic cardiomyopathy, chronic systolic heart failure, peripheral vascular disease, hypertension, hyperlipidemia, recent PE who presented to the ED on 04/09/2023 for shortness of breath. Cardiology was consulted for further evaluation.   # Pulmonary Embolism # NSTEMI Patient diagnosed with PE 8/20 presenting with worsening SOB since last Thursday. CT this admission demonstrated residual clot. Troponins elevated on admission and trended 87 > 176 > 286 > 291 > 176 > 154. Patient is without chest pain.  -Suspect troponin elevation/NSTEMI secondary to PE in the  absence of chest pain -Plan for heparin infusion for 48-72 hours (initiated 10/15 1632). Plan to restart eliquis prior to  discharge.  -No plan for further cardiac diagnostics.   # Coronary artery disease # Hyperlipidemia # Hypertension Patient with hx of CAD, s/p CABG x2 in 2007. No episodes of chest pain. Tolerating home meds well. BP borderline since admission. -Continue crestor 40 mg daily, aspirin 81 mg daily.  -Plan to restart home losartan as BP improves.   # Chronic kidney disease stage III Patient with known hx CKD. Cr on admission 1.97 improved to 1.59 today.   This patient's plan of care was discussed and created with Dr. Melton Alar and she is in agreement.  Signed: Gale Journey, PA-C  04/10/2023, 11:26 AM [facility]

## 2023-04-10 NOTE — Assessment & Plan Note (Signed)
Stable, not exacerbated --PRN bronchodilators

## 2023-04-10 NOTE — Assessment & Plan Note (Signed)
Suspect due to residual PE. Mgmt as outlined. Currently on IV heparin Resume Eliquis at d/c

## 2023-04-10 NOTE — Consult Note (Signed)
Pharmacy Consult Note - Anticoagulation  Pharmacy Consult for heparin Indication: chest pain/ACS  PATIENT MEASUREMENTS: Height: 5\' 9"  (175.3 cm) Weight: 74 kg (163 lb 2.3 oz) IBW/kg (Calculated) : 70.7 HEPARIN DW (KG): 74  VITAL SIGNS: Temp: 98.1 F (36.7 C) (10/15 2310) Temp Source: Oral (10/15 2310) BP: 106/54 (10/15 2310) Pulse Rate: 89 (10/15 2310)  Recent Labs    04/09/23 0954 04/09/23 1216 04/09/23 2105 04/10/23 0019  HGB 11.8*  --   --   --   HCT 37.6*  --   --   --   PLT 315  --   --   --   APTT 51*  --   --  52*  LABPROT 21.3*  --   --   --   INR 1.8*  --   --   --   CREATININE 1.97*  --   --   --   TROPONINIHS 87*   < > 291*  --    < > = values in this interval not displayed.    Estimated Creatinine Clearance: 32.4 mL/min (A) (by C-G formula based on SCr of 1.97 mg/dL (H)).  PAST MEDICAL HISTORY: Past Medical History:  Diagnosis Date   Cancer (HCC) 2012   Stage IIA squamous cell Lung Ca   CHF (congestive heart failure) (HCC) 01/28/2017   Diabetes mellitus    Gout    Hyperlipidemia    Hypertension    Myocardial infarction [facility])    Thyroid cancer (HCC) 2013   s/p thyroidectomy 2014    ASSESSMENT:1 77 y.o. male with PMH pulmonary embolism (on apixaban since 01/2023), lung cancer s/p RLL lobectomy, HFpEF (01/2023 EF 60-65%) is presenting with ACS. He endorses shortness of breath since Friday that worsens after minimal exertion. Patient has been adherent to apixaban and imaging is negative for PE but is concerning for cancer recurrence. Cardiac findings include ECG showing sinus tachycardia with borderline ST depression, and cTn currently trending up, from 87 to 176. BNP is 322. Pharmacy has been consulted to initiate and manage heparin intravenous infusion.  Pertinent medications: Apixaban 5 mg twice daily for PE  Goal(s) of therapy: Heparin level 0.3 - 0.7 units/mL Monitor platelets by anticoagulation protocol: Yes   Baseline anticoagulation  labs: Recent Labs    04/09/23 0954 04/10/23 0019  APTT 51* 52*  INR 1.8*  --   HGB 11.8*  --   PLT 315  --     Date Time aPTT/HL Rate/Comment 10/16 0019 52 / --  Subtherapeutic  PLAN: Give 2200 units bolus x1 Increase heparin infusion to 1050 units/hour. Recheck aPTT in 8 hrs after rate change Continue to titrate by aPTT until heparin level and aPTT correlate and/or apixaban washes out, then titrate by heparin level alone. Check heparin level with next AM labs. Continue to monitor CBC daily while on heparin infusion.   Otelia Sergeant, PharmD, [facility] 04/10/2023 12:41 AM

## 2023-04-10 NOTE — Consult Note (Signed)
Pharmacy Consult Note - Anticoagulation  Pharmacy Consult for heparin Indication: chest pain/ACS  PATIENT MEASUREMENTS: Height: 5\' 9"  (175.3 cm) Weight: 74 kg (163 lb 2.3 oz) IBW/kg (Calculated) : 70.7 HEPARIN DW (KG): 74  VITAL SIGNS: Temp: 98 F (36.7 C) (10/16 0724) Temp Source: Oral (10/16 0724) BP: 101/57 (10/16 1127) Pulse Rate: 86 (10/16 1127)  Recent Labs    04/09/23 0954 04/09/23 1216 04/10/23 0430 04/10/23 0710 04/10/23 1309  HGB 11.8*  --  10.4*  --   --   HCT 37.6*  --  31.5*  --   --   PLT 315  --  294  --   --   APTT 51*   < > 52*  --  49*  LABPROT 21.3*  --   --   --   --   INR 1.8*  --   --   --   --   HEPARINUNFRC  --   --  >1.10*  --   --   CREATININE 1.97*  --  1.59*  --   --   TROPONINIHS 87*   < > 176* 154*  --    < > = values in this interval not displayed.    Estimated Creatinine Clearance: 40.1 mL/min (A) (by C-G formula based on SCr of 1.59 mg/dL (H)).  PAST MEDICAL HISTORY: Past Medical History:  Diagnosis Date   Cancer (HCC) 2012   Stage IIA squamous cell Lung Ca   CHF (congestive heart failure) (HCC) 01/28/2017   Diabetes mellitus    Gout    Hyperlipidemia    Hypertension    Myocardial infarction [facility])    Thyroid cancer (HCC) 2013   s/p thyroidectomy 2014    ASSESSMENT:1 77 y.o. male with PMH pulmonary embolism (on apixaban since 01/2023), lung cancer s/p RLL lobectomy, HFpEF (01/2023 EF 60-65%) is presenting with ACS. He endorses shortness of breath since Friday that worsens after minimal exertion. Patient has been adherent to apixaban and imaging is negative for PE but is concerning for cancer recurrence. Cardiac findings include ECG showing sinus tachycardia with borderline ST depression, and cTn currently trending up, from 87 to 176. BNP is 322. Pharmacy has been consulted to initiate and manage heparin intravenous infusion.  Pertinent medications: Apixaban 5 mg twice daily for PE  Goal(s) of therapy: Heparin level 0.3 - 0.7  units/mL Monitor platelets by anticoagulation protocol: Yes   Baseline anticoagulation labs: Recent Labs    04/09/23 0954 04/10/23 0019 04/10/23 0430 04/10/23 1309  APTT 51* 52* 52* 49*  INR 1.8*  --   --   --   HGB 11.8*  --  10.4*  --   PLT 315  --  294  --     Date Time aPTT/HL Rate/Comment 10/16 0019 52 / --  Subtherapeutic 10/16 0430 52  Subtherapeutic @1050  units/hr (done 5hrs early) 10/16 1309 49  Subtherapeutic   PLAN: Heparin 2200 units IV bolus x 1, then increase infusion rate to 1300 un/hr Repeat aptt 8hrs after infusion rate changed. Continue to titrate by aPTT until heparin level and aPTT correlate and/or apixaban washes out, then titrate by heparin level alone. Check heparin level with next AM labs. Continue to monitor CBC daily while on heparin infusion.  Dajanique Robley Rodriguez-Guzman PharmD, BCPS 04/10/2023 2:00 PM

## 2023-04-10 NOTE — Plan of Care (Signed)

## 2023-04-10 NOTE — Progress Notes (Signed)
Transition of Care [facility]) - Inpatient Brief Assessment   Patient Details  Name: Nathaniel Lane MRN: [id number] Date of Birth: 1945/06/30  Transition of Care [facility]) CM/SW Contact:    Truddie Hidden, RN Phone Number: 04/10/2023, 2:10 PM   Clinical Narrative:  TOC continuing to follow patient's progress throughout discharge planning.   Transition of Care Asessment: Insurance and Status: Insurance coverage has been reviewed Patient has primary care physician: Yes Home environment has been reviewed: Home Prior level of function:: Independent Prior/Current Home Services: No current home services Social Determinants of Health Reivew: SDOH reviewed no interventions necessary Readmission risk has been reviewed: Yes Transition of care needs: no transition of care needs at this time

## 2023-04-10 NOTE — Progress Notes (Signed)
Progress Note   Patient: Nathaniel Lane FAO:[id number] DOB: [birth year] DOA: 04/09/2023     1 DOS: the patient was seen and examined on 04/10/2023   Brief hospital course: HPI on admission:  "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  Pt has had stable O2 sats on room air since admission.  CTA chest showed stable filling defect/s from prior PE. Hs-troponin was mildly elevated and trended upwards, so IV heparin was initiated.  Cardiology consulted for further recommendations.  Further hospital course and management as outlined below.   Assessment and Plan: * NSTEMI (non-ST elevated myocardial infarction) (HCC) Troponin peaked at 291. Likely demand ischemia in setting of PE, not ACS.  Pt has had no chest pain. --Cardiology consulted - follow up recs --Continue IV heparin for total 48-72 hours --No further cardiac evaluation planned or indicated at this time --Resume Eliquis at d/c  Postsurgical hypothyroidism Synthroid  History of lung cancer Squamous Cell Lung CA Stage IIA , s/p right lower lobectomy and adjuvant hemotherapy  --Follow up as scheduled with Oncology  Type 2 diabetes mellitus, controlled, with renal complications (HCC) .  Hyperlipidemia associated with type 2 diabetes mellitus (HCC) Atorvastatin   Leukocytosis Suspect reactive. WBC on admission 11.0 >> normalized to 8.9.  Pt reports some sinus congestion and post-nasal drainage but no fever/chills or feeling sick. --Monitor CBC --Monitor clinically for s/sx's of infection  Right lateral abdominal pain Reported on admission, appears resolved. 10/16 - pt does not  mention any abdominal pain.  LFT's normal except mildly elevated ALT of 46.  Normal Tbili.   --Monitor clinically for now  Shortness of breath Suspect due to residual PE. Mgmt as outlined. Currently on IV heparin Resume Eliquis at d/c  Type 2 diabetes mellitus with diabetic neuropathy, unspecified (HCC) Insulin SSI with at bedtime coverage ordered Home Jardiance, glipizide, metformin held on admission  COPD, mild (HCC) Stable, not exacerbated. PRN bronchodilators  CAD (coronary artery disease), autologous vein bypass graft On Atorvastatin 40 mg        Subjective: Pt up in recliner when seen today.  He reports breathing feels at baseline, but he has not ambulated distances longer than to/from bathroom. Denies chest pain.  Has cough he attributes to sinus drainage.  Denies any fever/chills or feeling sick.  No other acute complaints. Hopes to go home later today or tomorrow.   Physical Exam: Vitals:   04/10/23 0724 04/10/23 0724 04/10/23 1127 04/10/23 1540  BP: 124/61 124/61 (!) 101/57 104/67  Pulse: 93 92 86 93  Resp:    18  Temp: 98 F (36.7 C) 98 F (36.7 C)    TempSrc: Oral Oral    SpO2: 97% 97% 100% 98%  Weight:      Height:       General exam: awake, alert, no acute distress HEENT: atraumatic, clear conjunctiva, anicteric sclera, moist mucus membranes, hearing grossly normal  Respiratory system: CTAB diminished at right base, no expiratory wheezes, no rhonchi, normal respiratory effort at rest on room air Cardiovascular system: normal S1/S2, RRR, no JVD, murmurs, rubs, gallops, no pedal edema.   Gastrointestinal system: soft, NT, ND, no HSM felt, +bowel sounds. Central nervous  system: A&O x 4. no gross focal neurologic deficits, normal speech Extremities: moves all , no edema, normal tone Skin: dry, intact, normal temperature, normal color, No rashes, lesions or ulcers Psychiatry: normal mood, congruent affect, judgement and insight appear normal   Data  Reviewed:  Notable labs ---  Na 132, glucose 114, BUN 30, Cr 1.59, Ca 8.4, GFR 45,   Trop peak 291 >> 176 >> 154 Hbg 10.4  Family Communication: None present  Disposition: Status is: Inpatient Remains inpatient appropriate because: ongoing evaluation and remains on IV heparin to complete 48-72 hours   Planned Discharge Destination: Home    Time spent: 45 minutes  Author: Pennie Banter, DO 04/10/2023 6:36 PM  For on call review [email or web address].

## 2023-04-10 NOTE — Assessment & Plan Note (Signed)
Squamous Cell Lung CA Stage IIA , s/p right lower lobectomy and adjuvant hemotherapy  --Follow up as scheduled with Oncology

## 2023-04-11 DIAGNOSIS — I214 Non-ST elevation (NSTEMI) myocardial infarction: Secondary | ICD-10-CM | POA: Diagnosis not present

## 2023-04-11 LAB — BASIC METABOLIC PANEL WITH GFR
Anion gap: 9 (ref 5–15)
BUN: 29 mg/dL — ABNORMAL HIGH (ref 8–23)
CO2: 25 mmol/L (ref 22–32)
Calcium: 8.5 mg/dL — ABNORMAL LOW (ref 8.9–10.3)
Chloride: 99 mmol/L (ref 98–111)
Creatinine, Ser: 1.52 mg/dL — ABNORMAL HIGH (ref 0.61–1.24)
GFR, Estimated: 47 mL/min — ABNORMAL LOW
Glucose, Bld: 154 mg/dL — ABNORMAL HIGH (ref 70–99)
Potassium: 4.4 mmol/L (ref 3.5–5.1)
Sodium: 133 mmol/L — ABNORMAL LOW (ref 135–145)

## 2023-04-11 LAB — CBC
HCT: 35 % — ABNORMAL LOW (ref 39.0–52.0)
Hemoglobin: 11.1 g/dL — ABNORMAL LOW (ref 13.0–17.0)
MCH: 27.1 pg (ref 26.0–34.0)
MCHC: 31.7 g/dL (ref 30.0–36.0)
MCV: 85.6 fL (ref 80.0–100.0)
Platelets: 330 10*3/uL (ref 150–400)
RBC: 4.09 MIL/uL — ABNORMAL LOW (ref 4.22–5.81)
RDW: 14.5 % (ref 11.5–15.5)
WBC: 10.9 10*3/uL — ABNORMAL HIGH (ref 4.0–10.5)
nRBC: 0 % (ref 0.0–0.2)

## 2023-04-11 LAB — APTT
aPTT: 57 s — ABNORMAL HIGH (ref 24–36)
aPTT: 68 s — ABNORMAL HIGH (ref 24–36)

## 2023-04-11 LAB — GLUCOSE, CAPILLARY
Glucose-Capillary: 133 mg/dL — ABNORMAL HIGH (ref 70–99)
Glucose-Capillary: 187 mg/dL — ABNORMAL HIGH (ref 70–99)
Glucose-Capillary: 139 mg/dL — ABNORMAL HIGH (ref 70–99)
Glucose-Capillary: 156 mg/dL — ABNORMAL HIGH (ref 70–99)

## 2023-04-11 LAB — MAGNESIUM: Magnesium: 2 mg/dL (ref 1.7–2.4)

## 2023-04-11 LAB — HEPARIN LEVEL (UNFRACTIONATED): Heparin Unfractionated: 0.54 [IU]/mL (ref 0.30–0.70)

## 2023-04-11 MED ORDER — APIXABAN 5 MG PO TABS
5.0000 mg | ORAL_TABLET | Freq: Two times a day (BID) | ORAL | Status: DC
  Administered 2023-04-11 – 2023-04-12 (×2): 5 mg via ORAL
  Filled 2023-04-11 (×2): qty 1

## 2023-04-11 MED ORDER — APIXABAN 5 MG PO TABS: 5.0000 mg | ORAL_TABLET | Freq: Two times a day (BID) | ORAL | Status: DC

## 2023-04-11 MED ORDER — HEPARIN BOLUS VIA INFUSION
1100.0000 [IU] | Freq: Once | INTRAVENOUS | Status: AC
  Administered 2023-04-11: 1100 [IU] via INTRAVENOUS
  Filled 2023-04-11: qty 1100

## 2023-04-11 NOTE — Assessment & Plan Note (Addendum)
Recent diagnosis of RLL PE on 02/12/2023. --Treated with 48 hours IV heparin for elevated troponin. --Eliquis to resumed yesterday PM  Ambulatory O2 sats obtained this AM 10/17 - 89% on room air, briefly drops to 84% but quickly recovers to above 90%  CTA repeated on admission 10/15 - shows stable residual filling defect of RLL, consistent with residual thrombus. No new PE seen.

## 2023-04-11 NOTE — Progress Notes (Addendum)
Progress Note   Patient: Nathaniel Lane WGN:[id number] DOB: 10/26/1945 DOA: 04/09/2023     3 DOS: the patient was seen and examined on 04/12/2023   Brief hospital course: HPI on admission:  "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  Pt has had stable O2 sats on room air since admission.  CTA chest showed stable filling defect/s from prior PE. Hs-troponin was mildly elevated and trended upwards, so IV heparin was initiated.  Cardiology consulted for further recommendations.  Further hospital course and management as outlined below.   Assessment and Plan: * NSTEMI (non-ST elevated myocardial infarction) (HCC) Troponin peaked at 291. Likely demand ischemia in setting of PE, not ACS.  Pt has had no chest pain. --Cardiology consulted - follow up recs --Continue IV heparin for total 48-72 hours --No further cardiac evaluation planned or indicated at this time --Resume Eliquis at d/c  Leukocytosis Suspect reactive. WBC on admission 11.0 >> normalized to 8.9.  Pt reports some sinus congestion and post-nasal drainage but no fever/chills or feeling sick. --Monitor CBC --Monitor clinically for s/sx's of infection  Right lateral abdominal pain Reported on admission, appears resolved. 10/16 - pt does not mention any abdominal pain.  LFT's normal except mildly elevated ALT of 46.  Normal Tbili.   --Monitor clinically for now  Shortness of breath Suspect due to residual PE. Mgmt as outlined. Currently on IV heparin Resume Eliquis at d/c  Acute pulmonary embolism (HCC) Recent diagnosis of RLL PE on 02/12/2023. Currently on IV  heparin. Eliquis to resume after heparin   Ambulatory O2 sats obtained this AM 10/17 - 89% on room air, briefly drops to 84% but quickly recovers to above 90%  CTA repeated on admission 10/15 - shows stable residual filling defect of RLL, consistent with residual thrombus. No new PE seen.  Postsurgical hypothyroidism Synthroid  History of lung cancer Squamous Cell Lung CA Stage IIA , s/p right lower lobectomy and adjuvant hemotherapy  --Follow up as scheduled with Oncology  Type 2 diabetes mellitus, controlled, with renal complications (HCC) .  Hyperlipidemia associated with type 2 diabetes mellitus (HCC) Atorvastatin   Type 2 diabetes mellitus with diabetic neuropathy, unspecified (HCC) Insulin SSI with at bedtime coverage ordered Home Jardiance, glipizide, metformin held on admission  COPD, mild (HCC) Stable, not exacerbated. PRN bronchodilators  Chronic kidney disease, stage 3a (HCC) Stable.  CAD (coronary artery disease), autologous vein bypass graft On Atorvastatin 40 mg        Subjective: Pt up in recliner visiting with a friend when seen today.  He reports breathing feels okay, but reported oxygen level dropped into 80's with ambulating in hall earlier.  No chest pain, fever/chills or other complaints.  States he overall feels well.    Physical Exam: Vitals:   04/11/23 2332 04/12/23 0614 04/12/23 0829 04/12/23 1227  BP: (!) 112/59 102/68 (!) 118/59 93/63  Pulse: 87 85 83 90  Resp: 18 18    Temp: 98.1 F (36.7 C) 98.2 F (36.8 C) 98 F (36.7 C) (!) 97.5 F (36.4 C)  TempSrc:  Oral  Oral  SpO2: 97% 93% 93% 94%  Weight:      Height:  General exam: awake, alert, no acute distress HEENT: wearing glasses, moist mucus membranes, hearing grossly normal  Respiratory system: CTAB diminished at right base, no expiratory wheezes, no rhonchi, normal respiratory effort at rest on room air Cardiovascular system: normal S1/S2, RRR, no pedal edema.    Gastrointestinal system: soft, NT, ND Central nervous system: A&O x 4. no gross focal neurologic deficits, normal speech Extremities: moves all , no edema, normal tone Skin: dry, intact, normal temperature   Data Reviewed:  Notable labs ---  Na 132>> 133, glucose 154, BUN 29, Cr 1.52 stable, Ca 8.5, GFR 47,  WBC 10.9, Hbg 11.1  Family Communication: Male visitor at bedside on rounds. Pt able to update family.  Disposition: Status is: Inpatient Remains inpatient appropriate because: ongoing evaluation and remains on IV heparin to complete 48-72 hours   Planned Discharge Destination: Home    Time spent: 38 minutes  Author: Pennie Banter, DO 04/12/2023 2:16 PM  For on call review [email or web address].

## 2023-04-11 NOTE — Progress Notes (Signed)
SATURATION QUALIFICATIONS: (This note is used to comply with regulatory documentation for home oxygen)  Patient Saturations on Room Air at Rest = 100%  Patient Saturations on Room Air while Ambulating = 89%  Patient Saturations on 0 Liters of oxygen while Ambulating = 89%  Please briefly explain why patient needs home oxygen:  Patient's oxygen drops down to 84 while walking but comes back quickly as soon as he stands.

## 2023-04-11 NOTE — Progress Notes (Signed)
[facility] CLINIC CARDIOLOGY PROGRESS NOTE       [name] MRN: [id number] DOB/AGE: October 08, 1945 77 y.o.  Admit date: 04/09/2023 Referring Physician Dr. Esaw Grandchild Primary Physician Dr. Darrick Huntsman Primary Cardiologist Marijo Conception, NP/Dr. Juliann Pares Reason for Consultation NSTEMI  HPI: Nathaniel Lane is a 77 y.o. male  with a past medical history of coronary artery disease s/p CABG x2 2007, ischemic cardiomyopathy, chronic systolic heart failure, peripheral vascular disease, hypertension, hyperlipidemia, recent PE who presented to the ED on 04/09/2023 for shortness of breath. Cardiology was consulted for further evaluation.   Interval history: -Patient feeling well this morning, denies any chest pain, shortness of breath, palpitations. -Reports that he walked around the unit twice yesterday and tolerated this well without any significant dyspnea on exertion. -BP and heart rate remained stable.  Review of systems complete and found to be negative unless listed above    Past Medical History:  Diagnosis Date   Cancer (HCC) 2012   Stage IIA squamous cell Lung Ca   CHF (congestive heart failure) (HCC) 01/28/2017   Diabetes mellitus    Gout    Hyperlipidemia    Hypertension    Myocardial infarction [facility])    Thyroid cancer (HCC) 2013   s/p thyroidectomy 2014    Past Surgical History:  Procedure Laterality Date   COLONOSCOPY N/A 04/25/2016   Procedure: COLONOSCOPY;  Surgeon: Scot Jun, MD;  Location: [facility] ENDOSCOPY;  Service: Endoscopy;  Laterality: N/A;  Diabetic (oral meds)   COLONOSCOPY WITH PROPOFOL N/A 03/22/2021   Procedure: COLONOSCOPY WITH PROPOFOL;  Surgeon: Toney Reil, MD;  Location: [facility] ENDOSCOPY;  Service: Gastroenterology;  Laterality: N/A;   COLONOSCOPY WITH PROPOFOL N/A 06/29/2022   Procedure: COLONOSCOPY WITH PROPOFOL;  Surgeon: Jaynie Collins, DO;  Location: [facility] ENDOSCOPY;  Service: Gastroenterology;  Laterality: N/A;   CORONARY  ARTERY BYPASS GRAFT  2007   LUNG LOBECTOMY     TOTAL THYROIDECTOMY N/A     Medications Prior to Admission  Medication Sig Dispense Refill Last Dose   apixaban (ELIQUIS) 5 MG TABS tablet Take 1 tablet (5 mg total) by mouth 2 (two) times daily. 60 tablet 5    atorvastatin (LIPITOR) 40 MG tablet TAKE 1 TABLET BY MOUTH EVERY DAY 90 tablet 3    blood glucose meter kit and supplies KIT Dispense based on patient and insurance preference. Use up to four times daily as directed. (FOR ICD-10 E11.29). 1 each 0    empagliflozin (JARDIANCE) 10 MG TABS tablet Take 1 tablet (10 mg total) by mouth daily before breakfast. 30 tablet 2    gabapentin (NEURONTIN) 300 MG capsule Take 1 capsule (300 mg total) by mouth 4 (four) times daily. 360 capsule 0    glipiZIDE (GLUCOTROL XL) 5 MG 24 hr tablet TAKE 1 TABLET BY MOUTH EVERY DAY WITH BREAKFAST 90 tablet 3    levothyroxine (SYNTHROID) 137 MCG tablet Take 1 tablet (137 mcg total) by mouth daily before breakfast. 90 tablet 1    losartan (COZAAR) 50 MG tablet TAKE 1 TABLET BY MOUTH EVERY DAY 90 tablet 3    metFORMIN (GLUCOPHAGE-XR) 750 MG 24 hr tablet TAKE 1 TABLET BY MOUTH EVERY DAY WITH BREAKFAST 90 tablet 1    omeprazole (PRILOSEC) 20 MG capsule Take 20 mg by mouth daily.      ONETOUCH DELICA LANCETS FINE MISC USE AS DIRECTED 4 TIMES A DAY 100 each 2    ONETOUCH ULTRA test strip USE AS DIRECTED 4 TIMES A  DAY 100 strip 2    tamsulosin (FLOMAX) 0.4 MG CAPS capsule TAKE 1 CAPSULE BY MOUTH EVERY DAY 90 capsule 1    Social History   Socioeconomic History   Marital status: Widowed    Spouse name: Not on file   Number of children: Not on file   Years of education: Not on file   Highest education level: Not on file  Occupational History   Occupation: Parts Delivery    Employer: retired  Tobacco Use   Smoking status: Former    Current packs/day: 0.00    Types: Cigarettes    Quit date: 07/15/2005    Years since quitting: 17.7    Passive exposure: Past    Smokeless tobacco: Never  Vaping Use   Vaping status: Never Used  Substance and Sexual Activity   Alcohol use: No   Drug use: No   Sexual activity: Not on file  Other Topics Concern   Not on file  Social History Narrative   Lives alone; wife passed away 2019/06/20   Social Determinants of Health   Financial Resource Strain: Low Risk  (08/06/2022)   Overall Financial Resource Strain (CARDIA)    Difficulty of Paying Living Expenses: Not hard at all  Food Insecurity: No Food Insecurity (04/09/2023)   Hunger Vital Sign    Worried About Running Out of Food in the Last Year: Never true    Ran Out of Food in the Last Year: Never true  Transportation Needs: No Transportation Needs (04/09/2023)   PRAPARE - Administrator, Civil Service (Medical): No    Lack of Transportation (Non-Medical): No  Physical Activity: Insufficiently Active (08/06/2022)   Exercise Vital Sign    Days of Exercise per Week: 4 days    Minutes of Exercise per Session: 30 min  Stress: No Stress Concern Present (08/06/2022)   [employer] of Occupational Health - Occupational Stress Questionnaire    Feeling of Stress : Not at all  Social Connections: Unknown (08/06/2022)   Social Connection and Isolation Panel [NHANES]    Frequency of Communication with Friends and Family: More than three times a week    Frequency of Social Gatherings with Friends and Family: More than three times a week    Attends Religious Services: Not on file    Active Member of Clubs or Organizations: Yes    Attends Banker Meetings: More than 4 times per year    Marital Status: Widowed  Intimate Partner Violence: Not At Risk (04/09/2023)   Humiliation, Afraid, Rape, and Kick questionnaire    Fear of Current or Ex-Partner: No    Emotionally Abused: No    Physically Abused: No    Sexually Abused: No    Family History  Problem Relation Age of Onset   Hypertension Other    Heart disease Father      Vitals:    04/10/23 1953 04/10/23 2346 04/11/23 0356 04/11/23 0837  BP: 108/67 111/68 103/65 104/68  Pulse: 83 85 84 94  Resp: 16 18    Temp: 97.7 F (36.5 C) 97.9 F (36.6 C) 98.5 F (36.9 C) 97.9 F (36.6 C)  TempSrc: Oral  Oral   SpO2: 94% 96% 95% 97%  Weight:      Height:        PHYSICAL EXAM  General: Well appearing male, well nourished, in no acute distress sitting upright in bedside chair. HEENT: Normocephalic and atraumatic. Neck: No JVD.  Lungs: Normal respiratory effort on room  air. Clear bilaterally to auscultation. No wheezes, crackles, rhonchi.  Heart: HRRR. Normal S1 and S2 without gallops or murmurs.  Abdomen: Non-distended appearing.  Msk: Normal strength and tone for age. Extremities: Warm and well perfused. No clubbing, cyanosis. No edema.  Neuro: Alert and oriented X 3. Psych: Answers questions appropriately.   Labs: Basic Metabolic Panel: Recent Labs    04/10/23 0430 04/11/23 0453  NA 132* 133*  K 3.9 4.4  CL 100 99  CO2 24 25  GLUCOSE 114* 154*  BUN 30* 29*  CREATININE 1.59* 1.52*  CALCIUM 8.4* 8.5*  MG  --  2.0   Liver Function Tests: Recent Labs    04/09/23 1910  AST 41  ALT 46*  ALKPHOS 82  BILITOT 0.4  PROT 6.7  ALBUMIN 2.4*   No results for input(s): "LIPASE", "AMYLASE" in the last 72 hours. CBC: Recent Labs    04/10/23 0430 04/11/23 0453  WBC 8.9 10.9*  HGB 10.4* 11.1*  HCT 31.5* 35.0*  MCV 83.1 85.6  PLT 294 330   Cardiac Enzymes: Recent Labs    04/09/23 2105 04/10/23 0430 04/10/23 0710  TROPONINIHS 291* 176* 154*   BNP: Recent Labs    04/09/23 0954  BNP 322.1*   D-Dimer: No results for input(s): "DDIMER" in the last 72 hours. Hemoglobin A1C: No results for input(s): "HGBA1C" in the last 72 hours. Fasting Lipid Panel: No results for input(s): "CHOL", "HDL", "LDLCALC", "TRIG", "CHOLHDL", "LDLDIRECT" in the last 72 hours. Thyroid Function Tests: No results for input(s): "TSH", "T4TOTAL", "T3FREE", "THYROIDAB" in the  last 72 hours.  Invalid input(s): "FREET3" Anemia Panel: No results for input(s): "VITAMINB12", "FOLATE", "FERRITIN", "TIBC", "IRON", "RETICCTPCT" in the last 72 hours.   Radiology: DG Chest 2 View  Result Date: 04/09/2023 CLINICAL DATA:  Lung cancer.  Shortness of breath EXAM: CHEST - 2 VIEW COMPARISON:  X-ray 02/12/2023 FINDINGS: Stable right IJ chest port with tip along the upper SVC. Persistent small pleural effusions and right lung base parenchymal opacity. No pneumothorax or edema. Normal cardiopericardial silhouette. Sternal wires. Overlapping cardiac leads. Please see separate dictation of CT angiogram from same day IMPRESSION: Postop chest with port. Persistent small effusions and lung base opacities, right-greater-than-left. Please correlate with separate CT scan Electronically Signed   By: Karen Kays M.D.   On: 04/09/2023 12:43   CT Angio Chest PE W and/or Wo Contrast  Result Date: 04/09/2023 CLINICAL DATA:  Shortness of breath.  History of lung cancer. EXAM: CT ANGIOGRAPHY CHEST WITH CONTRAST TECHNIQUE: Multidetector CT imaging of the chest was performed using the standard protocol during bolus administration of intravenous contrast. Multiplanar CT image reconstructions and MIPs were obtained to evaluate the vascular anatomy. RADIATION DOSE REDUCTION: This exam was performed according to the departmental dose-optimization program which includes automated exposure control, adjustment of the mA and/or kV according to patient size and/or use of iterative reconstruction technique. CONTRAST:  80mL OMNIPAQUE IOHEXOL 350 MG/ML SOLN COMPARISON:  March 28, 2023.  February 12, 2023. FINDINGS: Cardiovascular: Stable rounded defect is noted in lower lobe branch of right pulmonary artery compared to prior exam of February 12, 2023, concerning for chronic or residual pulmonary embolus. No new embolus is noted. Status post coronary artery bypass graft. Mediastinum/Nodes: Status post thyroidectomy.  Esophagus is unremarkable. No significant adenopathy. Lungs/Pleura: No pneumothorax or pleural effusion is noted. Status post right lower lobectomy. Stable probable scarring seen in right lung base. 21 x 15 mm pleural base nodule is again noted posteriorly in left lower  lobe best seen on image number 71 of series 5. This is slightly enlarged and is concerning for possible malignancy or metastatic disease. Emphysematous disease is noted. Upper Abdomen: Cholelithiasis. Musculoskeletal: No chest wall abnormality. No acute or significant osseous findings. Review of the MIP images confirms the above findings. IMPRESSION: Stable rounded filling defect seen in lower lobe branch of right pulmonary artery compared to prior exam of February 12, 2023, concerning for chronic or residual pulmonary embolus. No new pulmonary embolus is noted. Status post right lower lobectomy with probable scarring seen in right lung base. 21 x 15 mm pleural based nodule noted posteriorly in left lower lobe which may be slightly enlarged compared to prior exam and is concerning for possible malignancy or metastatic disease. Cholelithiasis. Aortic Atherosclerosis (ICD10-I70.0) and Emphysema (ICD10-J43.9). Electronically Signed   By: Lupita Raider M.D.   On: 04/09/2023 11:47   CT Chest W Contrast  Result Date: 03/28/2023 CLINICAL DATA:  Monitoring non-small-cell lung cancer. * Tracking Code: BO * EXAM: CT CHEST WITH CONTRAST TECHNIQUE: Multidetector CT imaging of the chest was performed during intravenous contrast administration. RADIATION DOSE REDUCTION: This exam was performed according to the departmental dose-optimization program which includes automated exposure control, adjustment of the mA and/or kV according to patient size and/or use of iterative reconstruction technique. CONTRAST:  60mL OMNIPAQUE IOHEXOL 300 MG/ML  SOLN COMPARISON:  CT angiogram chest 02/12/2023.  Older chest CT exam. FINDINGS: Cardiovascular: Status post median  sternotomy. Right upper chest port with the tip seen as far as the upper SVC. The right brachiocephalic vein is somewhat small as is the internal jugular vein. Please correlate with history. Coronary artery calcifications are seen. The heart is nonenlarged. No pericardial effusion. The thoracic aorta has some partially calcified atherosclerotic plaque diffusely. As on the prior once again there is some nonocclusive thrombus in the right interlobar pulmonary artery. Extent and distribution is similar to previous. Again this has previously described. Mediastinum/Nodes: Normal caliber thoracic esophagus. Small thyroid gland. No specific abnormal lymph node enlargement identified in the axillary regions, right hilum. There are some small nodes in the right hilum and subcarinal mediastinum which are unchanged from previous and not pathologic by size criteria. Lungs/Pleura: Centrilobular emphysematous changes. Persistent left-sided pleural thickening with some calcifications. Juxtapleural on the left posteriorly in the superior segment of the lower lobe once again is a spiculated nodular area which previously measured 18 x 15 mm and today 21 by 17 mm. Please correlate with any prior workup for neoplasm. There are surgical changes of previous right lower lobectomy. Adjacent parenchymal opacity which is confluence is similar but the adjacent ill-defined opacities and interstitial thickening is improving. No new right-sided lung mass. Upper Abdomen: Left adrenal nodule is again seen. Previously measuring up to 16 mm and today 16 mm on series 2, image 162. Stones in the nondilated gallbladder. Musculoskeletal: Degenerative changes along the spine. Bridging osteophytes and syndesmophytes. Multilevel Schmorl's node deformities. IMPRESSION: Previous right lower lobectomy. The confluence opacity at the right lung base is stable. The surrounding interstitial thickening and ground-glass is improving in the interval. Persistent  left-sided pleural thickening and calcification however the associated juxtapleural spiculated nodule in the left lower lobe superior segment is slightly larger today. Please correlate for any history of neoplasm or further workup of this lesion. Stable small lymph nodes in the mediastinum and right hilum. Stable appearance of the previously described nonocclusive thrombus in the right interlobar pulmonary artery. Stable left adrenal nodule. Gallstones. Aortic Atherosclerosis (ICD10-I70.0) and  Emphysema (ICD10-J43.9). Electronically Signed   By: Karen Kays M.D.   On: 03/28/2023 10:15    ECHO 01/2023: 1. Left ventricular ejection fraction, by estimation, is 60 to 65%. The left ventricle has normal function. The left ventricle demonstrates regional wall motion abnormalities (basal to mid inferior wall hypokinesis). There is mild left ventricular  hypertrophy. Left ventricular diastolic parameters are consistent with Grade I diastolic dysfunction (impaired relaxation).   2. Right ventricular systolic function is normal. The right ventricular ize is normal. There is normal pulmonary artery systolic pressure. The estimated right ventricular systolic pressure is 19.7 mmHg.   3. The mitral valve is normal in structure. Mild mitral valve regurgitation. No evidence of mitral stenosis.   4. The aortic valve is normal in structure. Aortic valve regurgitation is not visualized. No aortic stenosis is present.   5. The inferior vena cava is normal in size with greater than 50% respiratory variability, suggesting right atrial pressure of 3 mmHg.   TELEMETRY reviewed by me 04/11/2023: Sinus rhythm PACs rate 90s  EKG reviewed by me: sinus rhythm PACs rate 93 bpm, nonacute  Data reviewed by me 04/11/2023: last 24h vitals tele labs imaging I/O hospitalist progress note  Principal Problem:   NSTEMI (non-ST elevated myocardial infarction) (HCC) Active Problems:   Hyperlipidemia associated with type 2 diabetes  mellitus (HCC)   CAD (coronary artery disease), autologous vein bypass graft   Type 2 diabetes mellitus, controlled, with renal complications (HCC)   History of lung cancer   Postsurgical hypothyroidism   COPD, mild (HCC)   Type 2 diabetes mellitus with diabetic neuropathy, unspecified (HCC)   Shortness of breath   Right lateral abdominal pain   Leukocytosis    ASSESSMENT AND PLAN:  Nathaniel Lane is a 77 y.o. male  with a past medical history of coronary artery disease s/p CABG x2 2007, ischemic cardiomyopathy, chronic systolic heart failure, peripheral vascular disease, hypertension, hyperlipidemia, recent PE who presented to the ED on 04/09/2023 for shortness of breath. Cardiology was consulted for further evaluation.   # Pulmonary Embolism # NSTEMI Patient diagnosed with PE 8/20 presenting with worsening SOB since last Thursday. CT this admission demonstrated residual clot. Troponins elevated on admission and trended 87 > 176 > 286 > 291 > 176 > 154. Patient is without chest pain.  -Suspect troponin elevation/NSTEMI secondary to PE in the absence of chest pain -Plan for heparin infusion for 48-72 hours (initiated 10/15 1632). Plan to restart eliquis prior to discharge.  -No plan for further cardiac diagnostics.   # Coronary artery disease # Hyperlipidemia # Hypertension Patient with hx of CAD, s/p CABG x2 in 2007. No episodes of chest pain. Tolerating home meds well. BP borderline since admission. -Continue crestor 40 mg daily, aspirin 81 mg daily.  -Plan to restart home losartan as BP improves.   # Chronic kidney disease stage III Patient with known hx CKD. Cr on admission 1.97 improved to 1.52 today.  Anticipate readiness for discharge tomorrow morning.  This patient's plan of care was discussed and created with Dr. Melton Alar and she is in agreement.  Signed: Gale Journey, PA-C  04/11/2023, 9:12 AM [facility]

## 2023-04-11 NOTE — Plan of Care (Signed)
  Problem: Education: Goal: Ability to describe self-care measures that may prevent or decrease complications (Diabetes Survival Skills Education) will improve Outcome: Progressing Goal: Individualized Educational Video(s) Outcome: Progressing   Problem: Coping: Goal: Ability to adjust to condition or change in health will improve Outcome: Progressing   Problem: Fluid Volume: Goal: Ability to maintain a balanced intake and output will improve Outcome: Progressing   Problem: Health Behavior/Discharge Planning: Goal: Ability to identify and utilize available resources and services will improve Outcome: Progressing Goal: Ability to manage health-related needs will improve Outcome: Progressing   Problem: Metabolic: Goal: Ability to maintain appropriate glucose levels will improve Outcome: Progressing   Problem: Nutritional: Goal: Maintenance of adequate nutrition will improve Outcome: Progressing Goal: Progress toward achieving an optimal weight will improve Outcome: Progressing   Problem: Skin Integrity: Goal: Risk for impaired skin integrity will decrease Outcome: Progressing   Problem: Tissue Perfusion: Goal: Adequacy of tissue perfusion will improve Outcome: Progressing   Problem: Education: Goal: Knowledge of General Education information will improve Description: Including pain rating scale, medication(s)/side effects and non-pharmacologic comfort measures Outcome: Progressing   Problem: Health Behavior/Discharge Planning: Goal: Ability to manage health-related needs will improve Outcome: Progressing   Problem: Clinical Measurements: Goal: Ability to maintain clinical measurements within normal limits will improve Outcome: Progressing Goal: Will remain free from infection Outcome: Progressing Goal: Diagnostic test results will improve Outcome: Progressing Goal: Respiratory complications will improve Outcome: Progressing Goal: Cardiovascular complication will  be avoided Outcome: Progressing   Problem: Activity: Goal: Risk for activity intolerance will decrease Outcome: Progressing   Problem: Nutrition: Goal: Adequate nutrition will be maintained Outcome: Progressing   Problem: Coping: Goal: Level of anxiety will decrease Outcome: Progressing   Problem: Elimination: Goal: Will not experience complications related to bowel motility Outcome: Progressing Goal: Will not experience complications related to urinary retention Outcome: Progressing   Problem: Pain Managment: Goal: General experience of comfort will improve Outcome: Progressing   Problem: Safety: Goal: Ability to remain free from injury will improve Outcome: Progressing   Problem: Skin Integrity: Goal: Risk for impaired skin integrity will decrease Outcome: Progressing   Problem: Education: Goal: Understanding of post-operative needs will improve Outcome: Progressing Goal: Individualized Educational Video(s) Outcome: Progressing   Problem: Clinical Measurements: Goal: Postoperative complications will be avoided or minimized Outcome: Progressing   Problem: Respiratory: Goal: Will regain and/or maintain adequate ventilation Outcome: Progressing

## 2023-04-11 NOTE — Consult Note (Signed)
Pharmacy Consult Note - Anticoagulation  Pharmacy Consult for heparin Indication: chest pain/ACS  PATIENT MEASUREMENTS: Height: 5\' 9"  (175.3 cm) Weight: 74 kg (163 lb 2.3 oz) IBW/kg (Calculated) : 70.7 HEPARIN DW (KG): 74  VITAL SIGNS: Temp: 98.5 F (36.9 C) (10/17 0356) Temp Source: Oral (10/17 0356) BP: 103/65 (10/17 0356) Pulse Rate: 84 (10/17 0356)  Recent Labs    04/09/23 0954 04/09/23 1216 04/10/23 0430 04/10/23 0710 04/10/23 1309 04/11/23 0453  HGB 11.8*  --  10.4*  --   --  11.1*  HCT 37.6*  --  31.5*  --   --  35.0*  PLT 315  --  294  --   --  330  APTT 51*   < > 52*  --    < > 57*  LABPROT 21.3*  --   --   --   --   --   INR 1.8*  --   --   --   --   --   HEPARINUNFRC  --    < > >1.10*  --   --  0.54  CREATININE 1.97*  --  1.59*  --   --   --   TROPONINIHS 87*   < > 176* 154*  --   --    < > = values in this interval not displayed.    Estimated Creatinine Clearance: 40.1 mL/min (A) (by C-G formula based on SCr of 1.59 mg/dL (H)).  PAST MEDICAL HISTORY: Past Medical History:  Diagnosis Date   Cancer (HCC) 2012   Stage IIA squamous cell Lung Ca   CHF (congestive heart failure) (HCC) 01/28/2017   Diabetes mellitus    Gout    Hyperlipidemia    Hypertension    Myocardial infarction [facility])    Thyroid cancer (HCC) 2013   s/p thyroidectomy 2014    ASSESSMENT:1 77 y.o. male with PMH pulmonary embolism (on apixaban since 01/2023), lung cancer s/p RLL lobectomy, HFpEF (01/2023 EF 60-65%) is presenting with ACS. He endorses shortness of breath since Friday that worsens after minimal exertion. Patient has been adherent to apixaban and imaging is negative for PE but is concerning for cancer recurrence. Cardiac findings include ECG showing sinus tachycardia with borderline ST depression, and cTn currently trending up, from 87 to 176. BNP is 322. Pharmacy has been consulted to initiate and manage heparin intravenous infusion.  Pertinent medications: Apixaban 5 mg twice  daily for PE  Goal(s) of therapy: Heparin level 0.3 - 0.7 units/mL Monitor platelets by anticoagulation protocol: Yes   Baseline anticoagulation labs: Recent Labs    04/09/23 0954 04/10/23 0019 04/10/23 0430 04/10/23 1309 04/10/23 2238 04/11/23 0453  APTT 51*   < > 52* 49* 76* 57*  INR 1.8*  --   --   --   --   --   HGB 11.8*  --  10.4*  --   --  11.1*  PLT 315  --  294  --   --  330   < > = values in this interval not displayed.    Date Time aPTT/HL Rate/Comment 10/16 0019 52 / --  Subtherapeutic 10/16 0430 52  Subtherapeutic @1050  units/hr (done 5hrs early) 10/16 1309 49  Subtherapeutic 10/16 2238 76 / --  aPTT Therapeutic x 1 10/17 0453 57 / 57  aPTT subtherapeutic, HL not correlating  PLAN: Bolus 1100 units x 1 Increase infusion rate to 1450 un/hr Repeat aptt in 8 hrs after rate change Continue to titrate by aPTT  until heparin level and aPTT correlate and/or apixaban washes out, then titrate by heparin level alone. Check heparin level with next AM labs. Continue to monitor CBC daily while on heparin infusion.  Otelia Sergeant, PharmD, [facility] 04/11/2023 5:46 AM

## 2023-04-11 NOTE — Consult Note (Signed)
Pharmacy Consult Note - Anticoagulation  Pharmacy Consult for heparin Indication: chest pain/ACS  PATIENT MEASUREMENTS: Height: 5\' 9"  (175.3 cm) Weight: 74 kg (163 lb 2.3 oz) IBW/kg (Calculated) : 70.7 HEPARIN DW (KG): 74  VITAL SIGNS: Temp: 97.9 F (36.6 C) (10/17 1248) Temp Source: Oral (10/17 0356) BP: 110/65 (10/17 1248) Pulse Rate: 101 (10/17 1248)  Recent Labs    04/09/23 0954 04/09/23 1216 04/10/23 0710 04/10/23 1309 04/11/23 0453 04/11/23 1425  HGB 11.8*   < >  --   --  11.1*  --   HCT 37.6*   < >  --   --  35.0*  --   PLT 315   < >  --   --  330  --   APTT 51*   < >  --    < > 57* 68*  LABPROT 21.3*  --   --   --   --   --   INR 1.8*  --   --   --   --   --   HEPARINUNFRC  --    < >  --   --  0.54  --   CREATININE 1.97*   < >  --   --  1.52*  --   TROPONINIHS 87*   < > 154*  --   --   --    < > = values in this interval not displayed.    Estimated Creatinine Clearance: 42 mL/min (A) (by C-G formula based on SCr of 1.52 mg/dL (H)).  PAST MEDICAL HISTORY: Past Medical History:  Diagnosis Date   Cancer (HCC) 2012   Stage IIA squamous cell Lung Ca   CHF (congestive heart failure) (HCC) 01/28/2017   Diabetes mellitus    Gout    Hyperlipidemia    Hypertension    Myocardial infarction [facility])    Thyroid cancer (HCC) 2013   s/p thyroidectomy 2014    ASSESSMENT:1 77 y.o. male with PMH pulmonary embolism (on apixaban since 01/2023), lung cancer s/p RLL lobectomy, HFpEF (01/2023 EF 60-65%) is presenting with ACS. He endorses shortness of breath since Friday that worsens after minimal exertion. Patient has been adherent to apixaban and imaging is negative for PE but is concerning for cancer recurrence. Cardiac findings include ECG showing sinus tachycardia with borderline ST depression, and cTn currently trending up, from 87 to 176. BNP is 322. Pharmacy has been consulted to initiate and manage heparin intravenous infusion.  Pertinent medications: Apixaban 5 mg twice  daily for PE  Goal(s) of therapy: Heparin level 0.3 - 0.7 units/mL Monitor platelets by anticoagulation protocol: Yes   Baseline anticoagulation labs: Recent Labs    04/09/23 0954 04/10/23 0019 04/10/23 0430 04/10/23 1309 04/10/23 2238 04/11/23 0453 04/11/23 1425  APTT 51*   < > 52*   < > 76* 57* 68*  INR 1.8*  --   --   --   --   --   --   HGB 11.8*  --  10.4*  --   --  11.1*  --   PLT 315  --  294  --   --  330  --    < > = values in this interval not displayed.    Date Time aPTT/HL Rate/Comment 10/16 0019 52 / --  Subtherapeutic 10/16 0430 52  Subtherapeutic @1050  units/hr (done 5hrs early) 10/16 1309 49  Subtherapeutic 10/16 2238 76 / --  aPTT Therapeutic x 1 10/17 0453 57 / 57  aPTT subtherapeutic,  HL not correlating 10/17 1425 68/ --  aPTT is therapeutic   PLAN: aPTT is therapeutic but on the lower limit of normal. Will increase heparin infusion to 1500 units/hr. Recheck heparin level in 8 hours. Apixaban is washing out. CBC daily while on heparin.   Paschal Dopp, PharmD, BCPS 04/11/2023 3:24 PM

## 2023-04-12 ENCOUNTER — Encounter: Payer: Self-pay | Admitting: Internal Medicine

## 2023-04-12 DIAGNOSIS — I214 Non-ST elevation (NSTEMI) myocardial infarction: Secondary | ICD-10-CM | POA: Diagnosis not present

## 2023-04-12 LAB — CBC
HCT: 31.1 % — ABNORMAL LOW (ref 39.0–52.0)
Hemoglobin: 10.1 g/dL — ABNORMAL LOW (ref 13.0–17.0)
MCH: 27.2 pg (ref 26.0–34.0)
MCHC: 32.5 g/dL (ref 30.0–36.0)
MCV: 83.8 fL (ref 80.0–100.0)
Platelets: 299 10*3/uL (ref 150–400)
RBC: 3.71 MIL/uL — ABNORMAL LOW (ref 4.22–5.81)
RDW: 14.5 % (ref 11.5–15.5)
WBC: 11.9 10*3/uL — ABNORMAL HIGH (ref 4.0–10.5)
nRBC: 0 % (ref 0.0–0.2)

## 2023-04-12 LAB — GLUCOSE, CAPILLARY
Glucose-Capillary: 145 mg/dL — ABNORMAL HIGH (ref 70–99)
Glucose-Capillary: 174 mg/dL — ABNORMAL HIGH (ref 70–99)

## 2023-04-12 NOTE — Care Management Important Message (Signed)
Important Message  Patient Details  Name: Nathaniel Lane MRN: [id number] Date of Birth: 1945-07-01   Important Message Given:  N/A - LOS <3 / Initial given by admissions     Olegario Messier A Lindsie Simar 04/12/2023, 1:25 PM

## 2023-04-12 NOTE — Discharge Summary (Addendum)
Physician Discharge Summary   Patient: Nathaniel Lane MRN: [id number] DOB: 08/17/1945  Admit date:     04/09/2023  Discharge date: 04/11/2025  Discharge Physician: Pennie Banter   PCP: Sherlene Shams, MD   Recommendations at discharge:   Follow up with Cardiology Follow up with Primary Care Repeat BMP, CBC, Mg at follow up and as needed  Discharge Diagnoses: Principal Problem:   NSTEMI (non-ST elevated myocardial infarction) (HCC) Active Problems:   Acute pulmonary embolism (HCC)   Leukocytosis   Postsurgical hypothyroidism   History of lung cancer   Type 2 diabetes mellitus, controlled, with renal complications (HCC)   Hyperlipidemia associated with type 2 diabetes mellitus (HCC)   CAD (coronary artery disease), autologous vein bypass graft   Chronic kidney disease, stage 3a (HCC)   COPD, mild (HCC)   Type 2 diabetes mellitus with diabetic neuropathy, unspecified (HCC)  Resolved Problems:   Shortness of breath   Right lateral abdominal pain  Hospital Course:  HPI on admission:  "Nathaniel Lane is a 77 year old male with history of stage II squamous cell carcinoma in the right lower lung, with left lung nodules, non-insulin-dependent diabetes mellitus, hypertension, hyperlipidemia, history of right lower lobe branch PE on Eliquis, who presented to the ED on 04/09/2023 for evaluation of worsening shortness of breath."  See H&P for full HPI and ED course on admission.   Pt has had stable O2 sats on room air since admission.   CTA chest showed stable filling defect/s from prior PE. Hs-troponin was mildly elevated and trended upwards, so IV heparin was initiated.  Cardiology consulted for further recommendations.   Further hospital course and management as outlined below.  04/12/23 -- pt feels well and has no acute complaints today.  Ambulated on unit with RN and maintains O2 sats above 90% today, will not qualify for home o2.  No significant dyspnea noted.  Pt is  medically stable and agreeable to d/c home today.    Assessment and Plan: Type 2 myocardial infarction due to demand ischemia from pulmonary embolism  * NSTEMI (non-ST elevated myocardial infarction) (HCC) Troponin peaked at 291. Suspect demand ischemia in setting of dyspnea from residual PE.  Pt has had no chest pain. --Cardiology consulted - follow up recs --Treated w IV heparin for 48 hours --No further cardiac evaluation planned or indicated at this time --Resumed Eliquis   Leukocytosis Suspect reactive. WBC on admission 11.0 >> normalized to 8.9.  Pt reports some sinus congestion and post-nasal drainage but no fever/chills or feeling sick. --Monitor CBC --Monitor clinically for s/sx's of infection  Acute pulmonary embolism (HCC) Recent diagnosis of RLL PE on 02/12/2023. --Treated with 48 hours IV heparin for elevated troponin. --Eliquis to resumed yesterday PM  Ambulatory O2 sats obtained this AM 10/17 - 89% on room air, briefly drops to 84% but quickly recovers to above 90%  CTA repeated on admission 10/15 - shows stable residual filling defect of RLL, consistent with residual thrombus. No new PE seen.  Right lateral abdominal pain-resolved as of 04/12/2023 Reported on admission, appears resolved. 10/16 - pt does not mention any abdominal pain.  LFT's normal except mildly elevated ALT of 46.  Normal Tbili.   --Monitor clinically for now  Shortness of breath-resolved as of 04/12/2023 Suspect due to residual PE. Mgmt as outlined. Currently on IV heparin Resume Eliquis at d/c  Postsurgical hypothyroidism Synthroid  History of lung cancer Squamous Cell Lung CA Stage IIA , s/p right lower lobectomy and  adjuvant hemotherapy  --Follow up as scheduled with Oncology  Type 2 diabetes mellitus, controlled, with renal complications (HCC) .  Hyperlipidemia associated with type 2 diabetes mellitus (HCC) Atorvastatin   Type 2 diabetes mellitus with diabetic neuropathy,  unspecified (HCC) Covered with sliding scale insulin  Home Jardiance, glipizide, metformin held on admission -- resume home meds at d/c  COPD, mild (HCC) Stable, not exacerbated. PRN bronchodilators  Chronic kidney disease, stage 3a (HCC) Stable. Monitor BMP  CAD (coronary artery disease), autologous vein bypass graft On Atorvastatin 40 mg - continue.  Cardiology resumed ASA, but pt declines to take it, citing he was told to stop ASA when started on Eliquis         Consultants: Cardiology Procedures performed: None  Disposition: Home Diet recommendation:  Cardiac diet DISCHARGE MEDICATION: Allergies as of 04/12/2023       Reactions   Penicillins Swelling   Swelling in mouth Swelling in mouth Swelling in mouth        Medication List     STOP taking these medications    losartan 50 MG tablet Commonly known as: COZAAR       TAKE these medications    apixaban 5 MG Tabs tablet Commonly known as: ELIQUIS Take 1 tablet (5 mg total) by mouth 2 (two) times daily.   atorvastatin 40 MG tablet Commonly known as: LIPITOR TAKE 1 TABLET BY MOUTH EVERY DAY   blood glucose meter kit and supplies Kit Dispense based on patient and insurance preference. Use up to four times daily as directed. (FOR ICD-10 E11.29).   empagliflozin 10 MG Tabs tablet Commonly known as: Jardiance Take 1 tablet (10 mg total) by mouth daily before breakfast.   gabapentin 300 MG capsule Commonly known as: NEURONTIN Take 1 capsule (300 mg total) by mouth 4 (four) times daily.   glipiZIDE 5 MG 24 hr tablet Commonly known as: GLUCOTROL XL TAKE 1 TABLET BY MOUTH EVERY DAY WITH BREAKFAST   levothyroxine 137 MCG tablet Commonly known as: SYNTHROID Take 1 tablet (137 mcg total) by mouth daily before breakfast.   metFORMIN 750 MG 24 hr tablet Commonly known as: GLUCOPHAGE-XR TAKE 1 TABLET BY MOUTH EVERY DAY WITH BREAKFAST   omeprazole 20 MG capsule Commonly known as: PRILOSEC Take 20  mg by mouth daily.   OneTouch Delica Lancets Fine Misc USE AS DIRECTED 4 TIMES A DAY   OneTouch Ultra test strip Generic drug: glucose blood USE AS DIRECTED 4 TIMES A DAY   tamsulosin 0.4 MG Caps capsule Commonly known as: FLOMAX TAKE 1 CAPSULE BY MOUTH EVERY DAY        Follow-up Information     Jeralyn Ruths, MD. Schedule an appointment as soon as possible for a visit .   Specialty: Oncology Contact information: [address] [place] [address] [phone]         Alwyn Pea, MD. Go in 1 week(s).   Specialties: Cardiology, Internal Medicine Contact information: [address] [place] [address] [phone]                Discharge Exam: Ceasar Mons Weights   04/09/23 0947  Weight: 74 kg   General exam: awake, alert, no acute distress HEENT: atraumatic, clear conjunctiva, anicteric sclera, moist mucus membranes, hearing grossly normal  Respiratory system: CTAB diminished R base, no wheezes, rales or rhonchi, normal respiratory effort. Cardiovascular system: normal S1/S2, RRR, no JVD, murmurs, rubs, gallops, no pedal edema.   Gastrointestinal system: soft, NT,  ND, no HSM felt, +bowel sounds. Central nervous system: A&O x 4. no gross focal neurologic deficits, normal speech Extremities: moves all, no edema, normal tone Skin: dry, intact, normal temperature, normal color, No rashes, lesions or ulcers Psychiatry: normal mood, congruent affect, judgement and insight appear normal   Condition at discharge: stable  The results of significant diagnostics from this hospitalization (including imaging, microbiology, ancillary and laboratory) are listed below for reference.   Imaging Studies: DG Chest 2 View  Result Date: 04/09/2023 CLINICAL DATA:  Lung cancer.  Shortness of breath EXAM: CHEST - 2 VIEW COMPARISON:  X-ray 02/12/2023 FINDINGS: Stable right IJ chest port with tip along the upper SVC. Persistent small pleural effusions  and right lung base parenchymal opacity. No pneumothorax or edema. Normal cardiopericardial silhouette. Sternal wires. Overlapping cardiac leads. Please see separate dictation of CT angiogram from same day IMPRESSION: Postop chest with port. Persistent small effusions and lung base opacities, right-greater-than-left. Please correlate with separate CT scan Electronically Signed   By: Karen Kays M.D.   On: 04/09/2023 12:43   CT Angio Chest PE W and/or Wo Contrast  Result Date: 04/09/2023 CLINICAL DATA:  Shortness of breath.  History of lung cancer. EXAM: CT ANGIOGRAPHY CHEST WITH CONTRAST TECHNIQUE: Multidetector CT imaging of the chest was performed using the standard protocol during bolus administration of intravenous contrast. Multiplanar CT image reconstructions and MIPs were obtained to evaluate the vascular anatomy. RADIATION DOSE REDUCTION: This exam was performed according to the departmental dose-optimization program which includes automated exposure control, adjustment of the mA and/or kV according to patient size and/or use of iterative reconstruction technique. CONTRAST:  80mL OMNIPAQUE IOHEXOL 350 MG/ML SOLN COMPARISON:  March 28, 2023.  February 12, 2023. FINDINGS: Cardiovascular: Stable rounded defect is noted in lower lobe branch of right pulmonary artery compared to prior exam of February 12, 2023, concerning for chronic or residual pulmonary embolus. No new embolus is noted. Status post coronary artery bypass graft. Mediastinum/Nodes: Status post thyroidectomy. Esophagus is unremarkable. No significant adenopathy. Lungs/Pleura: No pneumothorax or pleural effusion is noted. Status post right lower lobectomy. Stable probable scarring seen in right lung base. 21 x 15 mm pleural base nodule is again noted posteriorly in left lower lobe best seen on image number 71 of series 5. This is slightly enlarged and is concerning for possible malignancy or metastatic disease. Emphysematous disease is noted.  Upper Abdomen: Cholelithiasis. Musculoskeletal: No chest wall abnormality. No acute or significant osseous findings. Review of the MIP images confirms the above findings. IMPRESSION: Stable rounded filling defect seen in lower lobe branch of right pulmonary artery compared to prior exam of February 12, 2023, concerning for chronic or residual pulmonary embolus. No new pulmonary embolus is noted. Status post right lower lobectomy with probable scarring seen in right lung base. 21 x 15 mm pleural based nodule noted posteriorly in left lower lobe which may be slightly enlarged compared to prior exam and is concerning for possible malignancy or metastatic disease. Cholelithiasis. Aortic Atherosclerosis (ICD10-I70.0) and Emphysema (ICD10-J43.9). Electronically Signed   By: Lupita Raider M.D.   On: 04/09/2023 11:47   CT Chest W Contrast  Result Date: 03/28/2023 CLINICAL DATA:  Monitoring non-small-cell lung cancer. * Tracking Code: BO * EXAM: CT CHEST WITH CONTRAST TECHNIQUE: Multidetector CT imaging of the chest was performed during intravenous contrast administration. RADIATION DOSE REDUCTION: This exam was performed according to the departmental dose-optimization program which includes automated exposure control, adjustment of the mA and/or kV according to  patient size and/or use of iterative reconstruction technique. CONTRAST:  60mL OMNIPAQUE IOHEXOL 300 MG/ML  SOLN COMPARISON:  CT angiogram chest 02/12/2023.  Older chest CT exam. FINDINGS: Cardiovascular: Status post median sternotomy. Right upper chest port with the tip seen as far as the upper SVC. The right brachiocephalic vein is somewhat small as is the internal jugular vein. Please correlate with history. Coronary artery calcifications are seen. The heart is nonenlarged. No pericardial effusion. The thoracic aorta has some partially calcified atherosclerotic plaque diffusely. As on the prior once again there is some nonocclusive thrombus in the right  interlobar pulmonary artery. Extent and distribution is similar to previous. Again this has previously described. Mediastinum/Nodes: Normal caliber thoracic esophagus. Small thyroid gland. No specific abnormal lymph node enlargement identified in the axillary regions, right hilum. There are some small nodes in the right hilum and subcarinal mediastinum which are unchanged from previous and not pathologic by size criteria. Lungs/Pleura: Centrilobular emphysematous changes. Persistent left-sided pleural thickening with some calcifications. Juxtapleural on the left posteriorly in the superior segment of the lower lobe once again is a spiculated nodular area which previously measured 18 x 15 mm and today 21 by 17 mm. Please correlate with any prior workup for neoplasm. There are surgical changes of previous right lower lobectomy. Adjacent parenchymal opacity which is confluence is similar but the adjacent ill-defined opacities and interstitial thickening is improving. No new right-sided lung mass. Upper Abdomen: Left adrenal nodule is again seen. Previously measuring up to 16 mm and today 16 mm on series 2, image 162. Stones in the nondilated gallbladder. Musculoskeletal: Degenerative changes along the spine. Bridging osteophytes and syndesmophytes. Multilevel Schmorl's node deformities. IMPRESSION: Previous right lower lobectomy. The confluence opacity at the right lung base is stable. The surrounding interstitial thickening and ground-glass is improving in the interval. Persistent left-sided pleural thickening and calcification however the associated juxtapleural spiculated nodule in the left lower lobe superior segment is slightly larger today. Please correlate for any history of neoplasm or further workup of this lesion. Stable small lymph nodes in the mediastinum and right hilum. Stable appearance of the previously described nonocclusive thrombus in the right interlobar pulmonary artery. Stable left adrenal nodule.  Gallstones. Aortic Atherosclerosis (ICD10-I70.0) and Emphysema (ICD10-J43.9). Electronically Signed   By: Karen Kays M.D.   On: 03/28/2023 10:15    Microbiology: Results for orders placed or performed during the hospital encounter of 02/12/23  Blood culture (routine x 2)     Status: None   Collection Time: 02/12/23  7:57 PM   Specimen: BLOOD  Result Value Ref Range Status   Specimen Description BLOOD BLOOD LEFT ARM  Final   Special Requests   Final    BOTTLES DRAWN AEROBIC AND ANAEROBIC Blood Culture results may not be optimal due to an excessive volume of blood received in culture bottles   Culture   Final    NO GROWTH 5 DAYS Performed at [facility], [address] [place], [place] [address]    Report Status 02/17/2023 FINAL  Final  Blood culture (routine x 2)     Status: None   Collection Time: 02/12/23  7:57 PM   Specimen: BLOOD  Result Value Ref Range Status   Specimen Description BLOOD BLOOD LEFT ARM  Final   Special Requests   Final    BOTTLES DRAWN AEROBIC AND ANAEROBIC Blood Culture results may not be optimal due to an excessive volume of blood received in culture bottles   Culture  Final    NO GROWTH 5 DAYS Performed at [facility], [address] [place]., [place], [place] [address]    Report Status 02/17/2023 FINAL  Final    Labs: CBC: Recent Labs  Lab 04/09/23 0954 04/10/23 0430 04/11/23 0453 04/12/23 0526  WBC 11.0* 8.9 10.9* 11.9*  HGB 11.8* 10.4* 11.1* 10.1*  HCT 37.6* 31.5* 35.0* 31.1*  MCV 86.8 83.1 85.6 83.8  PLT 315 294 330 299   Basic Metabolic Panel: Recent Labs  Lab 04/09/23 0954 04/10/23 0430 04/11/23 0453  NA 132* 132* 133*  K 4.6 3.9 4.4  CL 100 100 99  CO2 23 24 25   GLUCOSE 177* 114* 154*  BUN 31* 30* 29*  CREATININE 1.97* 1.59* 1.52*  CALCIUM 8.7* 8.4* 8.5*  MG  --   --  2.0   Liver Function Tests: Recent Labs  Lab 04/09/23 1910  AST 41  ALT 46*  ALKPHOS 82  BILITOT 0.4  PROT 6.7  ALBUMIN 2.4*    CBG: Recent Labs  Lab 04/11/23 1249 04/11/23 1721 04/11/23 2121 04/12/23 0831 04/12/23 1228  GLUCAP 187* 139* 156* 145* 174*    Discharge time spent: less than 30 minutes.  Signed: Pennie Banter, DO [facility] 04/12/2023

## 2023-04-12 NOTE — Progress Notes (Signed)
[facility] CLINIC CARDIOLOGY PROGRESS NOTE       [name] MRN: [id number] DOB/AGE: 08/10/1945 77 y.o.  Admit date: 04/09/2023 Referring Physician Dr. Esaw Grandchild Primary Physician Dr. Darrick Huntsman Primary Cardiologist Marijo Conception, NP/Dr. Juliann Pares Reason for Consultation NSTEMI  HPI: Nathaniel Lane is a 77 y.o. male  with a past medical history of coronary artery disease s/p CABG x2 2007, ischemic cardiomyopathy, chronic systolic heart failure, peripheral vascular disease, hypertension, hyperlipidemia, recent PE who presented to the ED on 04/09/2023 for shortness of breath. Cardiology was consulted for further evaluation.   Interval history: -Patient feeling well this morning, denies any chest pain, shortness of breath, palpitations. -Reports that he walked around the unit twice today and had mild SOB. Patient states he will walk around the unit again before discharge today. Patient states he is ready to go home. -BP and heart rate remained stable.  Review of systems complete and found to be negative unless listed above    Past Medical History:  Diagnosis Date   Cancer (HCC) 2012   Stage IIA squamous cell Lung Ca   CHF (congestive heart failure) (HCC) 01/28/2017   Diabetes mellitus    Gout    Hyperlipidemia    Hypertension    Myocardial infarction [facility])    Thyroid cancer (HCC) 2013   s/p thyroidectomy 2014    Past Surgical History:  Procedure Laterality Date   COLONOSCOPY N/A 04/25/2016   Procedure: COLONOSCOPY;  Surgeon: Scot Jun, MD;  Location: [facility] ENDOSCOPY;  Service: Endoscopy;  Laterality: N/A;  Diabetic (oral meds)   COLONOSCOPY WITH PROPOFOL N/A 03/22/2021   Procedure: COLONOSCOPY WITH PROPOFOL;  Surgeon: Toney Reil, MD;  Location: [facility] ENDOSCOPY;  Service: Gastroenterology;  Laterality: N/A;   COLONOSCOPY WITH PROPOFOL N/A 06/29/2022   Procedure: COLONOSCOPY WITH PROPOFOL;  Surgeon: Jaynie Collins, DO;  Location: [facility] ENDOSCOPY;   Service: Gastroenterology;  Laterality: N/A;   CORONARY ARTERY BYPASS GRAFT  2007   LUNG LOBECTOMY     TOTAL THYROIDECTOMY N/A     Medications Prior to Admission  Medication Sig Dispense Refill Last Dose   apixaban (ELIQUIS) 5 MG TABS tablet Take 1 tablet (5 mg total) by mouth 2 (two) times daily. 60 tablet 5 04/08/2023   atorvastatin (LIPITOR) 40 MG tablet TAKE 1 TABLET BY MOUTH EVERY DAY 90 tablet 3 04/08/2023   empagliflozin (JARDIANCE) 10 MG TABS tablet Take 1 tablet (10 mg total) by mouth daily before breakfast. 30 tablet 2 04/08/2023   gabapentin (NEURONTIN) 300 MG capsule Take 1 capsule (300 mg total) by mouth 4 (four) times daily. 360 capsule 0 04/08/2023   glipiZIDE (GLUCOTROL XL) 5 MG 24 hr tablet TAKE 1 TABLET BY MOUTH EVERY DAY WITH BREAKFAST 90 tablet 3 04/08/2023   levothyroxine (SYNTHROID) 137 MCG tablet Take 1 tablet (137 mcg total) by mouth daily before breakfast. 90 tablet 1 04/08/2023   losartan (COZAAR) 50 MG tablet TAKE 1 TABLET BY MOUTH EVERY DAY 90 tablet 3 04/08/2023   metFORMIN (GLUCOPHAGE-XR) 750 MG 24 hr tablet TAKE 1 TABLET BY MOUTH EVERY DAY WITH BREAKFAST 90 tablet 1 04/08/2023   omeprazole (PRILOSEC) 20 MG capsule Take 20 mg by mouth daily.   04/08/2023   tamsulosin (FLOMAX) 0.4 MG CAPS capsule TAKE 1 CAPSULE BY MOUTH EVERY DAY 90 capsule 1 04/08/2023   blood glucose meter kit and supplies KIT Dispense based on patient and insurance preference. Use up to four times daily as directed. (FOR ICD-10 E11.29). 1  each 0    ONETOUCH DELICA LANCETS FINE MISC USE AS DIRECTED 4 TIMES A DAY 100 each 2    ONETOUCH ULTRA test strip USE AS DIRECTED 4 TIMES A DAY 100 strip 2    Social History   Socioeconomic History   Marital status: Widowed    Spouse name: Not on file   Number of children: Not on file   Years of education: Not on file   Highest education level: Not on file  Occupational History   Occupation: Parts Delivery    Employer: retired  Tobacco Use   Smoking  status: Former    Current packs/day: 0.00    Types: Cigarettes    Quit date: 07/15/2005    Years since quitting: 17.7    Passive exposure: Past   Smokeless tobacco: Never  Vaping Use   Vaping status: Never Used  Substance and Sexual Activity   Alcohol use: No   Drug use: No   Sexual activity: Not on file  Other Topics Concern   Not on file  Social History Narrative   Lives alone; wife passed away June 29, 2019   Social Determinants of Health   Financial Resource Strain: Low Risk  (08/06/2022)   Overall Financial Resource Strain (CARDIA)    Difficulty of Paying Living Expenses: Not hard at all  Food Insecurity: No Food Insecurity (04/09/2023)   Hunger Vital Sign    Worried About Running Out of Food in the Last Year: Never true    Ran Out of Food in the Last Year: Never true  Transportation Needs: No Transportation Needs (04/09/2023)   PRAPARE - Administrator, Civil Service (Medical): No    Lack of Transportation (Non-Medical): No  Physical Activity: Insufficiently Active (08/06/2022)   Exercise Vital Sign    Days of Exercise per Week: 4 days    Minutes of Exercise per Session: 30 min  Stress: No Stress Concern Present (08/06/2022)   [employer] of Occupational Health - Occupational Stress Questionnaire    Feeling of Stress : Not at all  Social Connections: Unknown (08/06/2022)   Social Connection and Isolation Panel [NHANES]    Frequency of Communication with Friends and Family: More than three times a week    Frequency of Social Gatherings with Friends and Family: More than three times a week    Attends Religious Services: Not on file    Active Member of Clubs or Organizations: Yes    Attends Banker Meetings: More than 4 times per year    Marital Status: Widowed  Intimate Partner Violence: Not At Risk (04/09/2023)   Humiliation, Afraid, Rape, and Kick questionnaire    Fear of Current or Ex-Partner: No    Emotionally Abused: No    Physically  Abused: No    Sexually Abused: No    Family History  Problem Relation Age of Onset   Hypertension Other    Heart disease Father      Vitals:   04/11/23 2026 04/11/23 2332 04/12/23 0614 04/12/23 0829  BP: 106/67 (!) 112/59 102/68 (!) 118/59  Pulse: 84 87 85 83  Resp: 20 18 18    Temp: 99 F (37.2 C) 98.1 F (36.7 C) 98.2 F (36.8 C) 98 F (36.7 C)  TempSrc: Oral  Oral   SpO2: 94% 97% 93% 93%  Weight:      Height:        PHYSICAL EXAM  General: Well appearing male, well nourished, in no acute distress sitting upright in  bedside chair. HEENT: Normocephalic and atraumatic. Neck: No JVD.  Lungs: Normal respiratory effort on room air. Clear bilaterally to auscultation. No wheezes, crackles, rhonchi.  Heart: HRRR. Normal S1 and S2 without gallops or murmurs.  Abdomen: Non-distended appearing.  Msk: Normal strength and tone for age. Extremities: Warm and well perfused. No clubbing, cyanosis. No edema.  Neuro: Alert and oriented X 3. Psych: Answers questions appropriately.   Labs: Basic Metabolic Panel: Recent Labs    04/10/23 0430 04/11/23 0453  NA 132* 133*  K 3.9 4.4  CL 100 99  CO2 24 25  GLUCOSE 114* 154*  BUN 30* 29*  CREATININE 1.59* 1.52*  CALCIUM 8.4* 8.5*  MG  --  2.0   Liver Function Tests: Recent Labs    04/09/23 1910  AST 41  ALT 46*  ALKPHOS 82  BILITOT 0.4  PROT 6.7  ALBUMIN 2.4*   No results for input(s): "LIPASE", "AMYLASE" in the last 72 hours. CBC: Recent Labs    04/11/23 0453 04/12/23 0526  WBC 10.9* 11.9*  HGB 11.1* 10.1*  HCT 35.0* 31.1*  MCV 85.6 83.8  PLT 330 299   Cardiac Enzymes: Recent Labs    04/09/23 2105 04/10/23 0430 04/10/23 0710  TROPONINIHS 291* 176* 154*   BNP: No results for input(s): "BNP" in the last 72 hours.  D-Dimer: No results for input(s): "DDIMER" in the last 72 hours. Hemoglobin A1C: No results for input(s): "HGBA1C" in the last 72 hours. Fasting Lipid Panel: No results for input(s): "CHOL",  "HDL", "LDLCALC", "TRIG", "CHOLHDL", "LDLDIRECT" in the last 72 hours. Thyroid Function Tests: No results for input(s): "TSH", "T4TOTAL", "T3FREE", "THYROIDAB" in the last 72 hours.  Invalid input(s): "FREET3" Anemia Panel: No results for input(s): "VITAMINB12", "FOLATE", "FERRITIN", "TIBC", "IRON", "RETICCTPCT" in the last 72 hours.   Radiology: DG Chest 2 View  Result Date: 04/09/2023 CLINICAL DATA:  Lung cancer.  Shortness of breath EXAM: CHEST - 2 VIEW COMPARISON:  X-ray 02/12/2023 FINDINGS: Stable right IJ chest port with tip along the upper SVC. Persistent small pleural effusions and right lung base parenchymal opacity. No pneumothorax or edema. Normal cardiopericardial silhouette. Sternal wires. Overlapping cardiac leads. Please see separate dictation of CT angiogram from same day IMPRESSION: Postop chest with port. Persistent small effusions and lung base opacities, right-greater-than-left. Please correlate with separate CT scan Electronically Signed   By: Karen Kays M.D.   On: 04/09/2023 12:43   CT Angio Chest PE W and/or Wo Contrast  Result Date: 04/09/2023 CLINICAL DATA:  Shortness of breath.  History of lung cancer. EXAM: CT ANGIOGRAPHY CHEST WITH CONTRAST TECHNIQUE: Multidetector CT imaging of the chest was performed using the standard protocol during bolus administration of intravenous contrast. Multiplanar CT image reconstructions and MIPs were obtained to evaluate the vascular anatomy. RADIATION DOSE REDUCTION: This exam was performed according to the departmental dose-optimization program which includes automated exposure control, adjustment of the mA and/or kV according to patient size and/or use of iterative reconstruction technique. CONTRAST:  80mL OMNIPAQUE IOHEXOL 350 MG/ML SOLN COMPARISON:  March 28, 2023.  February 12, 2023. FINDINGS: Cardiovascular: Stable rounded defect is noted in lower lobe branch of right pulmonary artery compared to prior exam of February 12, 2023,  concerning for chronic or residual pulmonary embolus. No new embolus is noted. Status post coronary artery bypass graft. Mediastinum/Nodes: Status post thyroidectomy. Esophagus is unremarkable. No significant adenopathy. Lungs/Pleura: No pneumothorax or pleural effusion is noted. Status post right lower lobectomy. Stable probable scarring seen in right lung  base. 21 x 15 mm pleural base nodule is again noted posteriorly in left lower lobe best seen on image number 71 of series 5. This is slightly enlarged and is concerning for possible malignancy or metastatic disease. Emphysematous disease is noted. Upper Abdomen: Cholelithiasis. Musculoskeletal: No chest wall abnormality. No acute or significant osseous findings. Review of the MIP images confirms the above findings. IMPRESSION: Stable rounded filling defect seen in lower lobe branch of right pulmonary artery compared to prior exam of February 12, 2023, concerning for chronic or residual pulmonary embolus. No new pulmonary embolus is noted. Status post right lower lobectomy with probable scarring seen in right lung base. 21 x 15 mm pleural based nodule noted posteriorly in left lower lobe which may be slightly enlarged compared to prior exam and is concerning for possible malignancy or metastatic disease. Cholelithiasis. Aortic Atherosclerosis (ICD10-I70.0) and Emphysema (ICD10-J43.9). Electronically Signed   By: Lupita Raider M.D.   On: 04/09/2023 11:47   CT Chest W Contrast  Result Date: 03/28/2023 CLINICAL DATA:  Monitoring non-small-cell lung cancer. * Tracking Code: BO * EXAM: CT CHEST WITH CONTRAST TECHNIQUE: Multidetector CT imaging of the chest was performed during intravenous contrast administration. RADIATION DOSE REDUCTION: This exam was performed according to the departmental dose-optimization program which includes automated exposure control, adjustment of the mA and/or kV according to patient size and/or use of iterative reconstruction technique.  CONTRAST:  60mL OMNIPAQUE IOHEXOL 300 MG/ML  SOLN COMPARISON:  CT angiogram chest 02/12/2023.  Older chest CT exam. FINDINGS: Cardiovascular: Status post median sternotomy. Right upper chest port with the tip seen as far as the upper SVC. The right brachiocephalic vein is somewhat small as is the internal jugular vein. Please correlate with history. Coronary artery calcifications are seen. The heart is nonenlarged. No pericardial effusion. The thoracic aorta has some partially calcified atherosclerotic plaque diffusely. As on the prior once again there is some nonocclusive thrombus in the right interlobar pulmonary artery. Extent and distribution is similar to previous. Again this has previously described. Mediastinum/Nodes: Normal caliber thoracic esophagus. Small thyroid gland. No specific abnormal lymph node enlargement identified in the axillary regions, right hilum. There are some small nodes in the right hilum and subcarinal mediastinum which are unchanged from previous and not pathologic by size criteria. Lungs/Pleura: Centrilobular emphysematous changes. Persistent left-sided pleural thickening with some calcifications. Juxtapleural on the left posteriorly in the superior segment of the lower lobe once again is a spiculated nodular area which previously measured 18 x 15 mm and today 21 by 17 mm. Please correlate with any prior workup for neoplasm. There are surgical changes of previous right lower lobectomy. Adjacent parenchymal opacity which is confluence is similar but the adjacent ill-defined opacities and interstitial thickening is improving. No new right-sided lung mass. Upper Abdomen: Left adrenal nodule is again seen. Previously measuring up to 16 mm and today 16 mm on series 2, image 162. Stones in the nondilated gallbladder. Musculoskeletal: Degenerative changes along the spine. Bridging osteophytes and syndesmophytes. Multilevel Schmorl's node deformities. IMPRESSION: Previous right lower  lobectomy. The confluence opacity at the right lung base is stable. The surrounding interstitial thickening and ground-glass is improving in the interval. Persistent left-sided pleural thickening and calcification however the associated juxtapleural spiculated nodule in the left lower lobe superior segment is slightly larger today. Please correlate for any history of neoplasm or further workup of this lesion. Stable small lymph nodes in the mediastinum and right hilum. Stable appearance of the previously described nonocclusive thrombus  in the right interlobar pulmonary artery. Stable left adrenal nodule. Gallstones. Aortic Atherosclerosis (ICD10-I70.0) and Emphysema (ICD10-J43.9). Electronically Signed   By: Karen Kays M.D.   On: 03/28/2023 10:15    ECHO 01/2023: 1. Left ventricular ejection fraction, by estimation, is 60 to 65%. The left ventricle has normal function. The left ventricle demonstrates regional wall motion abnormalities (basal to mid inferior wall hypokinesis). There is mild left ventricular  hypertrophy. Left ventricular diastolic parameters are consistent with Grade I diastolic dysfunction (impaired relaxation).   2. Right ventricular systolic function is normal. The right ventricular ize is normal. There is normal pulmonary artery systolic pressure. The estimated right ventricular systolic pressure is 19.7 mmHg.   3. The mitral valve is normal in structure. Mild mitral valve regurgitation. No evidence of mitral stenosis.   4. The aortic valve is normal in structure. Aortic valve regurgitation is not visualized. No aortic stenosis is present.   5. The inferior vena cava is normal in size with greater than 50% respiratory variability, suggesting right atrial pressure of 3 mmHg.   TELEMETRY reviewed by me 04/12/2023: Tele is currently down.  EKG reviewed by me: sinus rhythm PACs rate 93 bpm, nonacute  Data reviewed by me 04/12/2023: last 24h vitals tele labs imaging I/O hospitalist  progress note.  Principal Problem:   NSTEMI (non-ST elevated myocardial infarction) (HCC) Active Problems:   Hyperlipidemia associated with type 2 diabetes mellitus (HCC)   CAD (coronary artery disease), autologous vein bypass graft   Type 2 diabetes mellitus, controlled, with renal complications (HCC)   History of lung cancer   Postsurgical hypothyroidism   COPD, mild (HCC)   Type 2 diabetes mellitus with diabetic neuropathy, unspecified (HCC)   Acute pulmonary embolism (HCC)   Shortness of breath   Right lateral abdominal pain   Leukocytosis    ASSESSMENT AND PLAN:  Nathaniel Lane is a 77 y.o. male  with a past medical history of coronary artery disease s/p CABG x2 2007, ischemic cardiomyopathy, chronic systolic heart failure, peripheral vascular disease, hypertension, hyperlipidemia, recent PE who presented to the ED on 04/09/2023 for shortness of breath. Cardiology was consulted for further evaluation.   # Pulmonary Embolism # NSTEMI Patient diagnosed with PE 8/20 presenting with worsening SOB since last Thursday. CT this admission demonstrated residual clot. Troponins elevated on admission and trended 87 > 176 > 286 > 291 > 176 > 154. Patient is without chest pain.  -Suspect troponin elevation/NSTEMI secondary to PE in the absence of chest pain. Completed heparin infusion. - Cont. Eliquis 5 mg BID -No plan for further cardiac diagnostics. Patient is stable from cardiac perspective for discharge.  # Coronary artery disease # Hyperlipidemia # Hypertension Patient with hx of CAD, s/p CABG x2 in 2007. No episodes of chest pain. Tolerating home meds well. BP borderline since admission. -Continue rosuvastatin 40 mg daily, aspirin 81 mg daily.  -Plan to possibly restart losartan at outpatient follow-up with BP allows.  # Chronic kidney disease stage III Patient with known hx CKD. Cr on admission 1.97 improved to 1.52 on 10/17.  Patient is cleared to be discharged from cardiac  perspective.   This patient's plan of care was discussed and created with Dr. Melton Alar and she is in agreement.  Signed: Thana Ates, Student-PA  04/12/2023, 12:00 PM [facility] Cardiology   After conducting a review of all available clinical information with the care team, interviewing the patient, and performing a physical exam, I agree with the findings and plan  described in this note.   Nathaniel Searing Gerad Cornelio, DO 04/12/23 12:49 PM

## 2023-04-12 NOTE — Progress Notes (Signed)
SATURATION QUALIFICATIONS: (This note is used to comply with regulatory documentation for home oxygen)  Patient Saturations on Room Air at Rest = 96%  Patient Saturations on Room Air while Ambulating = 93%  Patient Saturations on 0 Liters of oxygen while Ambulating = 93%  Please briefly explain why patient needs home oxygen: 

## 2023-04-12 NOTE — Assessment & Plan Note (Addendum)
Stable.  Monitor BMP. °

## 2023-04-13 NOTE — Plan of Care (Signed)
[facility] Tonsillectomy/Adenoidectomy, Postoperative PEDS care plan entered in error.

## 2023-04-15 ENCOUNTER — Telehealth: Payer: Self-pay

## 2023-04-15 NOTE — Transitions of Care (Post Inpatient/ED Visit) (Signed)
04/15/2023  Name: Nathaniel Lane MRN: [id number] DOB: [birth year]  Today's TOC FU Call Status: Today's TOC FU Call Status:: Unsuccessful Call (1st Attempt) Unsuccessful Call (1st Attempt) Date: 04/15/23 Patient's Name and Date of Birth confirmed.  Transition Care Management Follow-up Telephone Call Date of Discharge: 04/13/23 Discharge Facility: [facility])      Deidre Ala, RN RN Care Manager [facility] [phone]

## 2023-04-16 ENCOUNTER — Telehealth: Payer: Self-pay

## 2023-04-16 NOTE — Transitions of Care (Post Inpatient/ED Visit) (Signed)
04/16/2023  Name: Nathaniel Lane MRN: [id number] DOB: [birth year]  Today's TOC FU Call Status: Today's TOC FU Call Status:: Successful TOC FU Call Completed TOC FU Call Complete Date: 04/16/23 Patient's Name and Date of Birth confirmed.  Transition Care Management Follow-up Telephone Call Date of Discharge: 04/12/23 Discharge Facility: [facility]) Type of Discharge: Inpatient Admission Primary Inpatient Discharge Diagnosis:: NSTEMI How have you been since you were released from the hospital?: Better Any questions or concerns?: No  Items Reviewed: Did you receive and understand the discharge instructions provided?: Yes Medications obtained,verified, and reconciled?: Yes (Medications Reviewed) Any new allergies since your discharge?: No Dietary orders reviewed?: NA  Medications Reviewed Today: Medications Reviewed Today     Reviewed by Redge Gainer, RN (Case Manager) on 04/16/23 at 1351  Med List Status: <None>   Medication Order Taking? Sig Documenting Provider Last Dose Status Informant  apixaban (ELIQUIS) 5 MG TABS tablet [id number] No Take 1 tablet (5 mg total) by mouth 2 (two) times daily. Sherlene Shams, MD 04/08/2023 Active Self  atorvastatin (LIPITOR) 40 MG tablet [id number] No TAKE 1 TABLET BY MOUTH EVERY DAY Sherlene Shams, MD 04/08/2023 Active Self  blood glucose meter kit and supplies KIT [id number] No Dispense based on patient and insurance preference. Use up to four times daily as directed. (FOR ICD-10 E11.29). Sherlene Shams, MD Taking Active Self  empagliflozin (JARDIANCE) 10 MG TABS tablet [id number] No Take 1 tablet (10 mg total) by mouth daily before breakfast. Sherlene Shams, MD 04/08/2023 Active Self  gabapentin (NEURONTIN) 300 MG capsule [id number] No Take 1 capsule (300 mg total) by mouth 4 (four) times daily. Sherlene Shams, MD 04/08/2023 Active Self  glipiZIDE (GLUCOTROL XL) 5 MG 24 hr tablet [id number] No TAKE 1 TABLET BY  MOUTH EVERY DAY WITH BREAKFAST Sherlene Shams, MD 04/08/2023 Active Self  levothyroxine (SYNTHROID) 137 MCG tablet [id number] No Take 1 tablet (137 mcg total) by mouth daily before breakfast. Arnetha Courser, MD 04/08/2023 Active Self  metFORMIN (GLUCOPHAGE-XR) 750 MG 24 hr tablet [id number] No TAKE 1 TABLET BY MOUTH EVERY DAY WITH BREAKFAST Sherlene Shams, MD 04/08/2023 Active Self  omeprazole (PRILOSEC) 20 MG capsule [id number] No Take 20 mg by mouth daily. [provider] 04/08/2023 Active Self  Dola Argyle LANCETS FINE MISC [id number] No USE AS DIRECTED 4 TIMES A DAY Sherlene Shams, MD Taking Active Self  ONETOUCH ULTRA test strip [id number] No USE AS DIRECTED 4 TIMES A DAY Sherlene Shams, MD Taking Active Self  tamsulosin (FLOMAX) 0.4 MG CAPS capsule [id number] No TAKE 1 CAPSULE BY MOUTH EVERY DAY Sherlene Shams, MD 04/08/2023 Active Self            Home Care and Equipment/Supplies: Were Home Health Services Ordered?: Yes Name of Home Health Agency:: [name] Has Agency set up a time to come to your home?: No EMR reviewed for Home Health Orders: Home Health Not Ordered Any new equipment or medical supplies ordered?: NA  Functional Questionnaire: Do you need assistance with bathing/showering or dressing?: No Do you need assistance with meal preparation?: No Do you need assistance with eating?: No Do you have difficulty maintaining continence: No Do you need assistance with getting out of bed/getting out of a chair/moving?: No Do you have difficulty managing or taking your medications?: No  Follow up appointments reviewed: PCP Follow-up appointment confirmed?: Yes Date of PCP follow-up appointment?: 06/07/23 Follow-up Provider: Dr. Darrick Huntsman Specialist [facility] Follow-up appointment confirmed?: Yes  Date of Specialist follow-up appointment?: 04/18/23 Follow-Up Specialty Provider:: Cardiology Do you need transportation to your follow-up appointment?: No Do you understand  care options if your condition(s) worsen?: Yes-patient verbalized understanding  SDOH Interventions Today    Flowsheet Row Most Recent Value  SDOH Interventions   Food Insecurity Interventions Intervention Not Indicated  Transportation Interventions Intervention Not Indicated      No Interventions needed. The patient is back to work. Declines 30 day enrollment  Deidre Ala, RN RN Care Manager [facility] [phone]

## 2023-04-18 DIAGNOSIS — N182 Chronic kidney disease, stage 2 (mild): Secondary | ICD-10-CM | POA: Diagnosis not present

## 2023-04-18 DIAGNOSIS — D3502 Benign neoplasm of left adrenal gland: Secondary | ICD-10-CM | POA: Diagnosis not present

## 2023-04-18 DIAGNOSIS — N1832 Chronic kidney disease, stage 3b: Secondary | ICD-10-CM | POA: Diagnosis not present

## 2023-04-18 DIAGNOSIS — I6523 Occlusion and stenosis of bilateral carotid arteries: Secondary | ICD-10-CM | POA: Diagnosis not present

## 2023-04-18 DIAGNOSIS — U071 COVID-19: Secondary | ICD-10-CM | POA: Diagnosis not present

## 2023-04-18 DIAGNOSIS — N2889 Other specified disorders of kidney and ureter: Secondary | ICD-10-CM | POA: Diagnosis not present

## 2023-04-18 DIAGNOSIS — Z85118 Personal history of other malignant neoplasm of bronchus and lung: Secondary | ICD-10-CM | POA: Diagnosis not present

## 2023-04-18 DIAGNOSIS — Z86711 Personal history of pulmonary embolism: Secondary | ICD-10-CM | POA: Diagnosis not present

## 2023-04-18 DIAGNOSIS — I25721 Atherosclerosis of autologous artery coronary artery bypass graft(s) with angina pectoris with documented spasm: Secondary | ICD-10-CM | POA: Diagnosis not present

## 2023-04-18 DIAGNOSIS — R8290 Unspecified abnormal findings in urine: Secondary | ICD-10-CM | POA: Diagnosis not present

## 2023-04-18 DIAGNOSIS — J449 Chronic obstructive pulmonary disease, unspecified: Secondary | ICD-10-CM | POA: Diagnosis not present

## 2023-04-18 DIAGNOSIS — I509 Heart failure, unspecified: Secondary | ICD-10-CM | POA: Diagnosis not present

## 2023-04-18 DIAGNOSIS — E785 Hyperlipidemia, unspecified: Secondary | ICD-10-CM | POA: Diagnosis not present

## 2023-04-18 DIAGNOSIS — I255 Ischemic cardiomyopathy: Secondary | ICD-10-CM | POA: Diagnosis not present

## 2023-04-18 DIAGNOSIS — I10 Essential (primary) hypertension: Secondary | ICD-10-CM | POA: Diagnosis not present

## 2023-04-18 DIAGNOSIS — E119 Type 2 diabetes mellitus without complications: Secondary | ICD-10-CM | POA: Diagnosis not present

## 2023-04-18 DIAGNOSIS — N2581 Secondary hyperparathyroidism of renal origin: Secondary | ICD-10-CM | POA: Diagnosis not present

## 2023-04-18 DIAGNOSIS — E1122 Type 2 diabetes mellitus with diabetic chronic kidney disease: Secondary | ICD-10-CM | POA: Diagnosis not present

## 2023-04-18 DIAGNOSIS — I2510 Atherosclerotic heart disease of native coronary artery without angina pectoris: Secondary | ICD-10-CM | POA: Diagnosis not present

## 2023-04-23 NOTE — Consult Note (Signed)
[facility] Inpatient Consult   04/23/2023  MARTINJR LUCKER 09-16-1945 [id number]  Primary Care Provider:  [facility]  Patient is currently active with Care Management for chronic disease management services.  Patient has been engaged by a  Gaffer.  Our community based plan of care has focused on disease management and community resource support.   Patient will receive a post hospital call and will be evaluated for assessments and disease process education.   Of note, Care Management services does not replace or interfere with any services that are needed or arranged by inpatient [facility] care management team.   For additional questions or referrals please contact:  Elliot Cousin, RN, [facility] Liaison [place]   [facility], Population Health Office Hours MTWF  8:00 am-6:00 pm Direct Dial: [phone] mobile [phone] [Office toll free line] Office Hours are M-F 8:30 - 5 pm [email or web address]

## 2023-04-24 ENCOUNTER — Telehealth: Payer: Self-pay | Admitting: *Deleted

## 2023-04-24 DIAGNOSIS — Z9889 Other specified postprocedural states: Secondary | ICD-10-CM | POA: Diagnosis not present

## 2023-04-24 DIAGNOSIS — N1832 Chronic kidney disease, stage 3b: Secondary | ICD-10-CM | POA: Diagnosis not present

## 2023-04-24 DIAGNOSIS — N2889 Other specified disorders of kidney and ureter: Secondary | ICD-10-CM | POA: Diagnosis not present

## 2023-04-24 DIAGNOSIS — U071 COVID-19: Secondary | ICD-10-CM | POA: Diagnosis not present

## 2023-04-24 DIAGNOSIS — E1122 Type 2 diabetes mellitus with diabetic chronic kidney disease: Secondary | ICD-10-CM | POA: Diagnosis not present

## 2023-04-24 DIAGNOSIS — Z86711 Personal history of pulmonary embolism: Secondary | ICD-10-CM | POA: Diagnosis not present

## 2023-04-24 DIAGNOSIS — D3502 Benign neoplasm of left adrenal gland: Secondary | ICD-10-CM | POA: Diagnosis not present

## 2023-04-24 DIAGNOSIS — I10 Essential (primary) hypertension: Secondary | ICD-10-CM | POA: Diagnosis not present

## 2023-04-24 DIAGNOSIS — R809 Proteinuria, unspecified: Secondary | ICD-10-CM | POA: Diagnosis not present

## 2023-04-24 NOTE — Progress Notes (Signed)
Care Coordination Note  04/24/2023 Name: Nathaniel Lane MRN: [id number] DOB: [birth year]  Nathaniel Lane is a 77 y.o. year old male who is a primary care patient of Darrick Huntsman, Mar Daring, MD and is actively engaged with the care management team. I reached out to Nathaniel Lane by phone today to assist with scheduling a follow up visit with the RN Case Manager  Follow up plan: Telephone appointment with care management team member scheduled for:05/01/2023  Burman Nieves, [facility] Care Coordination Care Guide Direct Dial: [phone]

## 2023-04-28 DIAGNOSIS — I130 Hypertensive heart and chronic kidney disease with heart failure and stage 1 through stage 4 chronic kidney disease, or unspecified chronic kidney disease: Secondary | ICD-10-CM | POA: Diagnosis not present

## 2023-04-28 DIAGNOSIS — N189 Chronic kidney disease, unspecified: Secondary | ICD-10-CM | POA: Diagnosis not present

## 2023-04-28 DIAGNOSIS — J181 Lobar pneumonia, unspecified organism: Secondary | ICD-10-CM | POA: Diagnosis not present

## 2023-04-28 DIAGNOSIS — J90 Pleural effusion, not elsewhere classified: Secondary | ICD-10-CM | POA: Diagnosis not present

## 2023-04-28 DIAGNOSIS — Z8616 Personal history of COVID-19: Secondary | ICD-10-CM | POA: Diagnosis not present

## 2023-04-28 DIAGNOSIS — W19XXXS Unspecified fall, sequela: Secondary | ICD-10-CM | POA: Diagnosis not present

## 2023-04-28 DIAGNOSIS — E871 Hypo-osmolality and hyponatremia: Secondary | ICD-10-CM | POA: Diagnosis not present

## 2023-04-28 DIAGNOSIS — I229 Subsequent ST elevation (STEMI) myocardial infarction of unspecified site: Secondary | ICD-10-CM | POA: Diagnosis not present

## 2023-04-28 DIAGNOSIS — I21A1 Myocardial infarction type 2: Secondary | ICD-10-CM | POA: Diagnosis not present

## 2023-04-28 DIAGNOSIS — I2699 Other pulmonary embolism without acute cor pulmonale: Secondary | ICD-10-CM | POA: Diagnosis not present

## 2023-04-28 DIAGNOSIS — J9621 Acute and chronic respiratory failure with hypoxia: Secondary | ICD-10-CM | POA: Diagnosis not present

## 2023-04-28 DIAGNOSIS — R579 Shock, unspecified: Secondary | ICD-10-CM | POA: Diagnosis not present

## 2023-04-28 DIAGNOSIS — T17908A Unspecified foreign body in respiratory tract, part unspecified causing other injury, initial encounter: Secondary | ICD-10-CM | POA: Diagnosis not present

## 2023-04-28 DIAGNOSIS — J9622 Acute and chronic respiratory failure with hypercapnia: Secondary | ICD-10-CM | POA: Diagnosis not present

## 2023-04-28 DIAGNOSIS — R1312 Dysphagia, oropharyngeal phase: Secondary | ICD-10-CM | POA: Diagnosis not present

## 2023-04-28 DIAGNOSIS — R0600 Dyspnea, unspecified: Secondary | ICD-10-CM | POA: Diagnosis not present

## 2023-04-28 DIAGNOSIS — E119 Type 2 diabetes mellitus without complications: Secondary | ICD-10-CM | POA: Diagnosis not present

## 2023-04-28 DIAGNOSIS — Z66 Do not resuscitate: Secondary | ICD-10-CM | POA: Diagnosis not present

## 2023-04-28 DIAGNOSIS — Z515 Encounter for palliative care: Secondary | ICD-10-CM | POA: Diagnosis not present

## 2023-04-28 DIAGNOSIS — K769 Liver disease, unspecified: Secondary | ICD-10-CM | POA: Diagnosis not present

## 2023-04-28 DIAGNOSIS — I5032 Chronic diastolic (congestive) heart failure: Secondary | ICD-10-CM | POA: Diagnosis not present

## 2023-04-28 DIAGNOSIS — R0902 Hypoxemia: Secondary | ICD-10-CM | POA: Diagnosis not present

## 2023-04-28 DIAGNOSIS — A419 Sepsis, unspecified organism: Secondary | ICD-10-CM | POA: Diagnosis not present

## 2023-04-28 DIAGNOSIS — J690 Pneumonitis due to inhalation of food and vomit: Secondary | ICD-10-CM | POA: Diagnosis not present

## 2023-04-28 DIAGNOSIS — R633 Feeding difficulties, unspecified: Secondary | ICD-10-CM | POA: Diagnosis not present

## 2023-04-28 DIAGNOSIS — I213 ST elevation (STEMI) myocardial infarction of unspecified site: Secondary | ICD-10-CM | POA: Diagnosis not present

## 2023-04-28 DIAGNOSIS — I509 Heart failure, unspecified: Secondary | ICD-10-CM | POA: Diagnosis not present

## 2023-04-28 DIAGNOSIS — D631 Anemia in chronic kidney disease: Secondary | ICD-10-CM | POA: Diagnosis not present

## 2023-04-28 DIAGNOSIS — N1832 Chronic kidney disease, stage 3b: Secondary | ICD-10-CM | POA: Diagnosis not present

## 2023-04-28 DIAGNOSIS — K8020 Calculus of gallbladder without cholecystitis without obstruction: Secondary | ICD-10-CM | POA: Diagnosis not present

## 2023-04-28 DIAGNOSIS — R0901 Asphyxia: Secondary | ICD-10-CM | POA: Diagnosis not present

## 2023-04-28 DIAGNOSIS — I214 Non-ST elevation (NSTEMI) myocardial infarction: Secondary | ICD-10-CM | POA: Diagnosis not present

## 2023-04-28 DIAGNOSIS — R092 Respiratory arrest: Secondary | ICD-10-CM | POA: Diagnosis not present

## 2023-04-28 DIAGNOSIS — E890 Postprocedural hypothyroidism: Secondary | ICD-10-CM | POA: Diagnosis not present

## 2023-04-28 DIAGNOSIS — Z20822 Contact with and (suspected) exposure to covid-19: Secondary | ICD-10-CM | POA: Diagnosis not present

## 2023-04-28 DIAGNOSIS — N179 Acute kidney failure, unspecified: Secondary | ICD-10-CM | POA: Diagnosis not present

## 2023-04-28 DIAGNOSIS — I10 Essential (primary) hypertension: Secondary | ICD-10-CM | POA: Diagnosis not present

## 2023-04-28 DIAGNOSIS — I2119 ST elevation (STEMI) myocardial infarction involving other coronary artery of inferior wall: Secondary | ICD-10-CM | POA: Diagnosis not present

## 2023-04-28 DIAGNOSIS — W19XXXA Unspecified fall, initial encounter: Secondary | ICD-10-CM | POA: Diagnosis not present

## 2023-04-28 DIAGNOSIS — R7989 Other specified abnormal findings of blood chemistry: Secondary | ICD-10-CM | POA: Diagnosis not present

## 2023-04-28 DIAGNOSIS — R6521 Severe sepsis with septic shock: Secondary | ICD-10-CM | POA: Diagnosis not present

## 2023-04-28 DIAGNOSIS — Z85118 Personal history of other malignant neoplasm of bronchus and lung: Secondary | ICD-10-CM | POA: Diagnosis not present

## 2023-04-28 DIAGNOSIS — R042 Hemoptysis: Secondary | ICD-10-CM | POA: Diagnosis not present

## 2023-04-28 DIAGNOSIS — J439 Emphysema, unspecified: Secondary | ICD-10-CM | POA: Diagnosis not present

## 2023-04-28 DIAGNOSIS — Z9889 Other specified postprocedural states: Secondary | ICD-10-CM | POA: Diagnosis not present

## 2023-04-28 DIAGNOSIS — R051 Acute cough: Secondary | ICD-10-CM | POA: Diagnosis not present

## 2023-04-28 DIAGNOSIS — J189 Pneumonia, unspecified organism: Secondary | ICD-10-CM | POA: Diagnosis not present

## 2023-04-28 DIAGNOSIS — E1122 Type 2 diabetes mellitus with diabetic chronic kidney disease: Secondary | ICD-10-CM | POA: Diagnosis not present

## 2023-04-28 DIAGNOSIS — E43 Unspecified severe protein-calorie malnutrition: Secondary | ICD-10-CM | POA: Diagnosis not present

## 2023-04-28 DIAGNOSIS — J9601 Acute respiratory failure with hypoxia: Secondary | ICD-10-CM | POA: Diagnosis not present

## 2023-04-28 DIAGNOSIS — J440 Chronic obstructive pulmonary disease with acute lower respiratory infection: Secondary | ICD-10-CM | POA: Diagnosis not present

## 2023-04-28 DIAGNOSIS — Z043 Encounter for examination and observation following other accident: Secondary | ICD-10-CM | POA: Diagnosis not present

## 2023-04-28 DIAGNOSIS — Z4682 Encounter for fitting and adjustment of non-vascular catheter: Secondary | ICD-10-CM | POA: Diagnosis not present

## 2023-04-28 DIAGNOSIS — C220 Liver cell carcinoma: Secondary | ICD-10-CM | POA: Diagnosis not present

## 2023-04-28 DIAGNOSIS — I255 Ischemic cardiomyopathy: Secondary | ICD-10-CM | POA: Diagnosis not present

## 2023-04-28 DIAGNOSIS — R1011 Right upper quadrant pain: Secondary | ICD-10-CM | POA: Diagnosis not present

## 2023-04-28 DIAGNOSIS — I2510 Atherosclerotic heart disease of native coronary artery without angina pectoris: Secondary | ICD-10-CM | POA: Diagnosis not present

## 2023-04-29 DIAGNOSIS — R7989 Other specified abnormal findings of blood chemistry: Secondary | ICD-10-CM | POA: Diagnosis not present

## 2023-05-01 DIAGNOSIS — I2510 Atherosclerotic heart disease of native coronary artery without angina pectoris: Secondary | ICD-10-CM | POA: Diagnosis not present

## 2023-05-01 DIAGNOSIS — R7989 Other specified abnormal findings of blood chemistry: Secondary | ICD-10-CM | POA: Diagnosis not present

## 2023-05-01 DIAGNOSIS — R042 Hemoptysis: Secondary | ICD-10-CM | POA: Diagnosis not present

## 2023-05-06 ENCOUNTER — Ambulatory Visit: Payer: Self-pay

## 2023-05-06 NOTE — Patient Outreach (Signed)
  Care Coordination   05/06/2023 Name: Nathaniel Lane MRN: [id number] DOB: [birth year]   Care Coordination Outreach Attempts:  Telephone call to [facility] hospital to confirm patients ongoing admission.  Patient remains in hospital at this time.   Follow Up Plan:  Additional outreach attempts will be made to offer the patient care coordination information and services.   Encounter Outcome:  Will attempt return call to patient.   Care Coordination Interventions:  No, not indicated    George Ina RN,BSN,CCM [facility]  [facility], [facility] coordinator / Case Manager Phone: [phone]

## 2023-05-15 ENCOUNTER — Telehealth: Payer: Self-pay | Admitting: *Deleted

## 2023-05-15 NOTE — Progress Notes (Signed)
  Care Coordination Note  05/15/2023 Name: FAHAD LADER MRN: [id number] DOB: 04/10/1946  Nathaniel Lane is a 77 y.o. year old male who is a primary care patient of Darrick Huntsman, Mar Daring, MD and is actively engaged with the care management team. I reached out to Nathaniel Lane by phone today to assist with re-scheduling a follow up visit with the RN Case Manager  Follow up plan: Unsuccessful telephone outreach attempt made. A HIPAA compliant phone message was left for the patient providing contact information and requesting a return call.   Burman Nieves, CCMA Care Coordination Care Guide Direct Dial: [phone]

## 2023-05-22 NOTE — Progress Notes (Signed)
  Care Coordination Note  05/22/2023 Name: Nathaniel Lane MRN: [id number] DOB: 12/12/1945  Nathaniel Lane is a 77 y.o. year old male who is a primary care patient of Darrick Huntsman, Mar Daring, MD and is actively engaged with the care management team. I reached out to Nathaniel Lane by phone today to assist with re-scheduling a follow up visit with the RN Case Manager  Follow up plan: We have been unable to make contact with the patient for follow up.   Burman Nieves, CCMA Care Coordination Care Guide Direct Dial: [phone]

## 2023-05-26 DEATH — deceased

## 2023-06-07 ENCOUNTER — Ambulatory Visit: Payer: PPO | Admitting: Internal Medicine

## 2023-06-13 NOTE — Patient Outreach (Signed)
  Care Coordination   06/13/2023 Name: Nathaniel Lane MRN: [id number] DOB: Apr 13, 1946   Care Coordination Outreach Attempts:  Message received from community care guide, Staci Ashworth unable to re-establish contact with patient. Care coordination goals closed.   Follow Up Plan:  No further outreach attempts will be made at this time. We have been unable to contact the patient to offer or enroll patient in complex care management services.  Encounter Outcome:  Completed   Care Coordination Interventions:  No, not indicated    George Ina RN,BSN,CCM [facility]  [facility], [facility] coordinator / Case Manager Phone: [phone]

## 2023-07-03 ENCOUNTER — Encounter: Payer: Self-pay | Admitting: *Deleted

## 2023-07-05 ENCOUNTER — Ambulatory Visit: Payer: PPO

## 2023-07-12 ENCOUNTER — Ambulatory Visit: Payer: PPO | Admitting: Oncology
